# Patient Record
Sex: Male | Born: 1940 | Race: White | Hispanic: No
Health system: Midwestern US, Community
[De-identification: ages and names within clinical notes are randomized; demographics above are authoritative.]

## PROBLEM LIST (undated history)

## (undated) DIAGNOSIS — Z794 Long term (current) use of insulin: Secondary | ICD-10-CM

## (undated) DIAGNOSIS — E1065 Type 1 diabetes mellitus with hyperglycemia: Secondary | ICD-10-CM

## (undated) DIAGNOSIS — N401 Enlarged prostate with lower urinary tract symptoms: Secondary | ICD-10-CM

## (undated) DIAGNOSIS — I10 Essential (primary) hypertension: Secondary | ICD-10-CM

## (undated) DIAGNOSIS — E119 Type 2 diabetes mellitus without complications: Secondary | ICD-10-CM

## (undated) DIAGNOSIS — N138 Other obstructive and reflux uropathy: Secondary | ICD-10-CM

## (undated) DIAGNOSIS — F0280 Dementia in other diseases classified elsewhere without behavioral disturbance: Secondary | ICD-10-CM

## (undated) DIAGNOSIS — Z1159 Encounter for screening for other viral diseases: Secondary | ICD-10-CM

## (undated) DIAGNOSIS — G301 Alzheimer's disease with late onset: Secondary | ICD-10-CM

## (undated) DIAGNOSIS — M6281 Muscle weakness (generalized): Secondary | ICD-10-CM

## (undated) DIAGNOSIS — I69354 Hemiplegia and hemiparesis following cerebral infarction affecting left non-dominant side: Secondary | ICD-10-CM

## (undated) DIAGNOSIS — R197 Diarrhea, unspecified: Secondary | ICD-10-CM

## (undated) DIAGNOSIS — M810 Age-related osteoporosis without current pathological fracture: Secondary | ICD-10-CM

## (undated) DIAGNOSIS — Z515 Encounter for palliative care: Secondary | ICD-10-CM

## (undated) DIAGNOSIS — R350 Frequency of micturition: Secondary | ICD-10-CM

## (undated) DIAGNOSIS — R21 Rash and other nonspecific skin eruption: Secondary | ICD-10-CM

## (undated) DIAGNOSIS — G20 Parkinson's disease: Secondary | ICD-10-CM

## (undated) DIAGNOSIS — G20A1 Parkinson's disease without dyskinesia, without mention of fluctuations: Secondary | ICD-10-CM

## (undated) DIAGNOSIS — J449 Chronic obstructive pulmonary disease, unspecified: Secondary | ICD-10-CM

## (undated) DIAGNOSIS — K219 Gastro-esophageal reflux disease without esophagitis: Secondary | ICD-10-CM

## (undated) DIAGNOSIS — F1910 Other psychoactive substance abuse, uncomplicated: Secondary | ICD-10-CM

## (undated) DIAGNOSIS — G309 Alzheimer's disease, unspecified: Secondary | ICD-10-CM

## (undated) DIAGNOSIS — F209 Schizophrenia, unspecified: Secondary | ICD-10-CM

---

## 2014-04-17 ENCOUNTER — Emergency Department: Payer: Self-pay | Admitting: Emergency Medicine

## 2014-04-17 LAB — CBC WITH DIFFERENTIAL/PLATELET
Basophil #: 0.1 10*3/uL (ref 0.0–0.1)
Basophil %: 0.6 %
EOS PCT: 0.7 %
Eosinophil #: 0.1 10*3/uL (ref 0.0–0.7)
HCT: 40.8 % (ref 40.0–52.0)
HGB: 13.4 g/dL (ref 13.0–18.0)
LYMPHS ABS: 2.5 10*3/uL (ref 1.0–3.6)
Lymphocyte %: 26.4 %
MCH: 30.8 pg (ref 26.0–34.0)
MCHC: 32.8 g/dL (ref 32.0–36.0)
MCV: 94 fL (ref 80–100)
MONO ABS: 0.5 x10 3/mm (ref 0.2–1.0)
MONOS PCT: 4.8 %
Neutrophil #: 6.5 10*3/uL (ref 1.4–6.5)
Neutrophil %: 67.5 %
PLATELETS: 267 10*3/uL (ref 150–440)
RBC: 4.35 10*6/uL — ABNORMAL LOW (ref 4.40–5.90)
RDW: 13.1 % (ref 11.5–14.5)
WBC: 9.6 10*3/uL (ref 3.8–10.6)

## 2014-04-17 LAB — PROTIME-INR
INR: 1.1
PROTHROMBIN TIME: 13.6 s (ref 11.5–14.7)

## 2014-04-17 LAB — BASIC METABOLIC PANEL
ANION GAP: 6 — AB (ref 7–16)
BUN: 14 mg/dL (ref 7–18)
CO2: 30 mmol/L (ref 21–32)
CREATININE: 0.83 mg/dL (ref 0.60–1.30)
Calcium, Total: 9 mg/dL (ref 8.5–10.1)
Chloride: 102 mmol/L (ref 98–107)
EGFR (African American): >60
GLUCOSE: 135 mg/dL — AB (ref 65–99)
Osmolality: 278 (ref 275–301)
Potassium: 3.8 mmol/L (ref 3.5–5.1)
Sodium: 138 mmol/L (ref 136–145)

## 2018-03-04 ENCOUNTER — Encounter: Payer: Self-pay | Admitting: Emergency Medicine

## 2018-03-04 ENCOUNTER — Emergency Department: Payer: Medicare HMO

## 2018-03-04 ENCOUNTER — Emergency Department
Admission: EM | Admit: 2018-03-04 | Discharge: 2018-03-04 | Disposition: A | Discharge status: Home / Self Care | Payer: Medicare HMO | Attending: Emergency Medicine | Admitting: Emergency Medicine

## 2018-03-04 ENCOUNTER — Other Ambulatory Visit: Payer: Self-pay

## 2018-03-04 DIAGNOSIS — Y998 Other external cause status: Secondary | ICD-10-CM | POA: Diagnosis not present

## 2018-03-04 DIAGNOSIS — Y92128 Other place in nursing home as the place of occurrence of the external cause: Secondary | ICD-10-CM | POA: Insufficient documentation

## 2018-03-04 DIAGNOSIS — S0101XA Laceration without foreign body of scalp, initial encounter: Secondary | ICD-10-CM | POA: Insufficient documentation

## 2018-03-04 DIAGNOSIS — S0990XA Unspecified injury of head, initial encounter: Secondary | ICD-10-CM

## 2018-03-04 DIAGNOSIS — W01190A Fall on same level from slipping, tripping and stumbling with subsequent striking against furniture, initial encounter: Secondary | ICD-10-CM | POA: Insufficient documentation

## 2018-03-04 DIAGNOSIS — W19XXXA Unspecified fall, initial encounter: Secondary | ICD-10-CM

## 2018-03-04 DIAGNOSIS — Y9389 Activity, other specified: Secondary | ICD-10-CM | POA: Diagnosis not present

## 2018-03-04 DIAGNOSIS — J449 Chronic obstructive pulmonary disease, unspecified: Secondary | ICD-10-CM | POA: Insufficient documentation

## 2018-03-04 DIAGNOSIS — Z87891 Personal history of nicotine dependence: Secondary | ICD-10-CM | POA: Diagnosis not present

## 2018-03-04 HISTORY — DX: Dementia in other diseases classified elsewhere, unspecified severity, without behavioral disturbance, psychotic disturbance, mood disturbance, and anxiety: F02.80

## 2018-03-04 HISTORY — DX: Parkinson's disease: G20

## 2018-03-04 HISTORY — DX: Other psychoactive substance abuse, uncomplicated: F19.10

## 2018-03-04 HISTORY — DX: Parkinson's disease without dyskinesia, without mention of fluctuations: G20.A1

## 2018-03-04 HISTORY — DX: Schizophrenia, unspecified: F20.9

## 2018-03-04 HISTORY — DX: Gastro-esophageal reflux disease without esophagitis: K21.9

## 2018-03-04 HISTORY — DX: Age-related osteoporosis without current pathological fracture: M81.0

## 2018-03-04 HISTORY — DX: Alzheimer's disease, unspecified: G30.9

## 2018-03-04 HISTORY — DX: Chronic obstructive pulmonary disease, unspecified: J44.9

## 2018-03-04 LAB — GLUCOSE, CAPILLARY: Glucose-Capillary: 89 mg/dL (ref 65–99)

## 2018-03-04 MED ORDER — LIDOCAINE-EPINEPHRINE-TETRACAINE (LET) SOLUTION
3.0000 mL | Freq: Once | NASAL | Status: DC
  Filled 2018-03-04: qty 3

## 2018-03-04 NOTE — ED Notes (Signed)
CT as noted. Patient moved to flex wait.

## 2018-03-04 NOTE — ED Provider Notes (Signed)
[facility] Emergency Department Provider Note  ____________________________________________  Time seen: Approximately 6:05 PM  I have reviewed the triage vital signs and the nursing notes.   HISTORY  Chief Complaint Fall and Head Injury    HPI Aaron Cain is a 77 y.o. male who presents the emergency department status post a fall.  Patient was in [facility] when he was attempting to take box of books out of his closet.  Patient reports that the weight was above his head, he was unable to maintain the same and it caused him to fall striking his head on the corner of a bookcase.  Patient reports that he sustained a laceration to the left posterior scalp.  No loss of consciousness.  Patient reports that he has a headache at this time but denies any visual changes, neck pain, chest pain, shortness of breath, abdominal pain, nausea or vomiting.  Patient reports that this fall was mechanical in nature.  He had no presyncopal or dizzy episodes prior to this event.  No medications prior to arrival.  Bleeding was controlled with direct pressure.  Patient denies any blood thinner use.  Alzheimer's disease, however patient is alert and oriented x4.  Patient is able to answer all questions regarding his own history, present events leading up to injury and since injury.  Patient has a history of Alzheimer's, COPD, GERD, osteoporosis, Parkinson's, schizophrenia.  He is accompanied by caregiver from assisted living facility.  Per caregiver, patient is at his baseline.  Past Medical History:  Diagnosis Date  . Alzheimer disease   . COPD (chronic obstructive pulmonary disease) (HCC)   . Drug abuse (HCC)   . GERD (gastroesophageal reflux disease)   . Osteoporosis   . Parkinson disease (HCC)   . Schizophrenia (HCC)     There are no active problems to display for this patient.   History reviewed. No pertinent surgical history.  Prior  to Admission medications   Medication Sig Start Date End Date Taking? Authorizing Provider  alendronate (FOSAMAX) 70 MG tablet Take 70 mg by mouth once a week. Take with a full glass of water on an empty stomach.   Yes [provider]  divalproex (DEPAKOTE) 500 MG DR tablet Take 500 mg by mouth 3 (three) times daily.   Yes [provider]  mirtazapine (REMERON) 30 MG tablet Take 30 mg by mouth at bedtime.   Yes [provider]  omeprazole (PRILOSEC) 20 MG capsule Take 20 mg by mouth daily.   Yes [provider]    Allergies Penicillins and Sulfa antibiotics  History reviewed. No pertinent family history.  Social History Social History   Tobacco Use  . Smoking status: Former Games developer  . Smokeless tobacco: Never Used  Substance Use Topics  . Alcohol use: Not Currently  . Drug use: Not Currently     Review of Systems  Constitutional: No fever/chills Eyes: No visual changes. No discharge ENT: No upper respiratory complaints. Cardiovascular: no chest pain. Respiratory: no cough. No SOB. Gastrointestinal: No abdominal pain.  No nausea, no vomiting.  No diarrhea.  No constipation. Genitourinary: Negative for dysuria. No hematuria Musculoskeletal: Negative for musculoskeletal pain. Skin: Positive for laceration to posterior scalp Neurological: Positive for headache focal weakness or numbness. 10-point ROS otherwise negative.  ____________________________________________   PHYSICAL EXAM:  VITAL SIGNS: ED Triage Vitals  Enc Vitals Group     BP 03/04/18 1554 140/83     Pulse Rate 03/04/18  1554 (!) 107     Resp 03/04/18 1554 20     Temp 03/04/18 1554 98.2 F (36.8 C)     Temp Source 03/04/18 1554 Oral     SpO2 03/04/18 1554 91 %     Weight --      Height --      Head Circumference --      Peak Flow --      Pain Score 03/04/18 1555 2     Pain Loc --      Pain Edu? --      Excl. in GC? --      Constitutional: Alert and oriented. Well  appearing and in no acute distress. Eyes: Conjunctivae are normal. PERRL. EOMI. Head: 3 cm linear laceration with an additional 4 cm of linear abrasion extending from laceration or appreciated to the left occipital and parietal scalp region.  No bleeding at this time.  No foreign body.  Patient is tender to palpation over this region.  No other tenderness to palpation over the osseous structures of the skull and face.  No battle signs, raccoon eyes, serosanguineous fluid drainage from ears or nares. ENT:      Ears:       Nose: No congestion/rhinnorhea.      Mouth/Throat: Mucous membranes are moist.  Neck: No stridor.  No cervical spine tenderness to palpation.  Cardiovascular: Normal rate, regular rhythm. Normal S1 and S2.  Good peripheral circulation. Respiratory: Normal respiratory effort without tachypnea or retractions. Lungs CTAB. Good air entry to the bases with no decreased or absent breath sounds. Musculoskeletal: Full range of motion to all extremities. No gross deformities appreciated. Neurologic:  Normal speech and language. No gross focal neurologic deficits are appreciated.  Cranial nerves II through XII grossly intact. Skin:  Skin is warm, dry and intact. No rash noted. Psychiatric: Mood and affect are normal. Speech and behavior are normal. Patient exhibits appropriate insight and judgement.   ____________________________________________   LABS (all labs ordered are listed, but only abnormal results are displayed)  Labs Reviewed  GLUCOSE, CAPILLARY  CBG MONITORING, ED   ____________________________________________  EKG   ____________________________________________  RADIOLOGY Festus Barren Cuthriell, personally viewed and evaluated these images (plain radiographs) as part of my medical decision making, as well as reviewing the written report by the radiologist.  I concur with radiologist finding of no acute intracranial hemorrhage or skull fracture.  Ct Head Wo  Contrast  Result Date: 03/04/2018 CLINICAL DATA:  Pt states he was grabbing a box of personal items from his closet and the box fell on his head. Pt denies LOC. EXAM: CT HEAD WITHOUT CONTRAST TECHNIQUE: Contiguous axial images were obtained from the base of the skull through the vertex without intravenous contrast. COMPARISON:  04/17/2014 FINDINGS: Brain: No evidence of acute infarction, hemorrhage, hydrocephalus, extra-axial collection or mass lesion/mass effect. There is ventricular and sulcal enlargement reflecting moderate atrophy. Patchy hypoattenuation is noted in the left parietal lobe which may reflect an old area of infarction or asymmetric small-vessel ischemic change. This is stable. Vascular: No hyperdense vessel or unexpected calcification. Skull: Normal. Negative for fracture or focal lesion. Sinuses/Orbits: Globes and orbits are unremarkable. Right ethmoid sinus mucosal thickening. Remaining sinuses and mastoid air cells are clear. Other: None. IMPRESSION: 1. No acute intracranial abnormalities. 2. Atrophy and chronic microvascular ischemic change. 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 No change from the prior head CT. Electronically Signed   By: Amie Portland M.D.   On:  03/04/2018 17:11    ____________________________________________    PROCEDURES  Procedure(s) performed:    Marland KitchenMarland KitchenLaceration Repair Date/Time: 03/04/2018 7:10 PM Performed by: Racheal Patches, PA-C Authorized by: Racheal Patches, PA-C   Consent:    Consent obtained:  Verbal   Consent given by:  Patient and healthcare agent   Risks discussed:  Pain Anesthesia (see MAR for exact dosages):    Anesthesia method:  Topical application   Topical anesthetic:  LET Laceration details:    Location:  Scalp   Scalp location:  L parietal   Length (cm):  3 Repair type:    Repair type:  Simple Pre-procedure details:    Preparation:  Patient was prepped and draped in usual sterile fashion Exploration:     Hemostasis achieved with:  LET and direct pressure   Wound exploration: wound explored through full range of motion and entire depth of wound probed and visualized     Wound extent: no foreign bodies/material noted, no muscle damage noted, no nerve damage noted, no tendon damage noted, no underlying fracture noted and no vascular damage noted     Contaminated: no   Treatment:    Area cleansed with:  Shur-Clens   Amount of cleaning:  Standard Skin repair:    Repair method:  Staples   Number of staples:  4 Approximation:    Approximation:  Close Post-procedure details:    Dressing:  Open (no dressing)   Patient tolerance of procedure:  Tolerated well, no immediate complications      Medications  lidocaine-EPINEPHrine-tetracaine (LET) solution (has no administration in time range)     ____________________________________________   INITIAL IMPRESSION / ASSESSMENT AND PLAN / ED COURSE  Pertinent labs & imaging results that were available during my care of the patient were reviewed by me and considered in my medical decision making (see chart for details).  Review of the [place] CSRS was performed in accordance of the [facility] prior to dispensing any controlled drugs.     Patient's diagnosis is consistent with fall with head laceration.  Patient had a mechanical fall, struck his head on the corner of a dresser.  Patient had a laceration bleeding controlled prior to arrival.  Exam is reassuring.  Patient was neurologically intact.  At baseline per his healthcare caregiver.  Patient does have Alzheimer's but was able to recount the entire event, provide his own past medical history.  CT scan reveals no intracranial or osseous abnormality.  Patient's head laceration was closed using staples..  She will follow-up with primary care in 1 week for staple removal.  Patient is given ED precautions to return to the ED for any worsening or new  symptoms.     ____________________________________________  FINAL CLINICAL IMPRESSION(S) / ED DIAGNOSES  Final diagnoses:  Fall, initial encounter  Laceration of scalp, initial encounter  Minor head injury, initial encounter      NEW MEDICATIONS STARTED DURING THIS VISIT:  ED Discharge Orders    None          This chart was dictated using voice recognition software/Dragon. Despite best efforts to proofread, errors can occur which can change the meaning. Any change was purely unintentional.    Racheal Patches, PA-C 03/04/18 1911    Arnaldo Natal, MD 03/09/18 1314

## 2018-03-04 NOTE — ED Triage Notes (Signed)
Pt arrived via POV from [facility]. Pt states he was getting stuff out of his closet using his right arm. Pt states his right arm is weak from a fall 2-3 days ago which was mechanical. Pt states when he fell today he hit his head on the corner of the table. Pt has laceration to the back of the head. Bleeding is controlled at this time.  Pt is alert and oriented, states he is not on any blood thinners.  Pt here with caregiver from [facility].   Pt denies any dizziness or lightheadedness.  Pt states pain is 2/10.  Denies any LOC.

## 2018-03-04 NOTE — ED Notes (Addendum)
See triage note. States he fell while getting something out of closet  Hit head  No loc  Laceration noted to left side of scalp

## 2018-03-04 NOTE — ED Notes (Signed)
D/w Dr. Fanny Bien pt's CC, new orders received for CBG and CT head non=contrast.  Pt denies any other pain at this time.

## 2018-07-09 DIAGNOSIS — F209 Schizophrenia, unspecified: Secondary | ICD-10-CM | POA: Diagnosis not present

## 2018-07-10 DIAGNOSIS — F209 Schizophrenia, unspecified: Secondary | ICD-10-CM | POA: Diagnosis not present

## 2018-07-11 DIAGNOSIS — F209 Schizophrenia, unspecified: Secondary | ICD-10-CM | POA: Diagnosis not present

## 2018-07-12 DIAGNOSIS — F209 Schizophrenia, unspecified: Secondary | ICD-10-CM | POA: Diagnosis not present

## 2018-07-13 DIAGNOSIS — F209 Schizophrenia, unspecified: Secondary | ICD-10-CM | POA: Diagnosis not present

## 2018-07-14 DIAGNOSIS — F209 Schizophrenia, unspecified: Secondary | ICD-10-CM | POA: Diagnosis not present

## 2018-07-15 DIAGNOSIS — F209 Schizophrenia, unspecified: Secondary | ICD-10-CM | POA: Diagnosis not present

## 2018-07-16 DIAGNOSIS — F209 Schizophrenia, unspecified: Secondary | ICD-10-CM | POA: Diagnosis not present

## 2018-07-17 DIAGNOSIS — F209 Schizophrenia, unspecified: Secondary | ICD-10-CM | POA: Diagnosis not present

## 2018-07-18 DIAGNOSIS — F209 Schizophrenia, unspecified: Secondary | ICD-10-CM | POA: Diagnosis not present

## 2018-07-19 DIAGNOSIS — F209 Schizophrenia, unspecified: Secondary | ICD-10-CM | POA: Diagnosis not present

## 2018-07-20 DIAGNOSIS — F209 Schizophrenia, unspecified: Secondary | ICD-10-CM | POA: Diagnosis not present

## 2018-07-21 DIAGNOSIS — F209 Schizophrenia, unspecified: Secondary | ICD-10-CM | POA: Diagnosis not present

## 2018-07-28 DIAGNOSIS — R32 Unspecified urinary incontinence: Secondary | ICD-10-CM | POA: Diagnosis not present

## 2018-07-28 DIAGNOSIS — G309 Alzheimer's disease, unspecified: Secondary | ICD-10-CM | POA: Diagnosis not present

## 2018-07-29 DIAGNOSIS — F209 Schizophrenia, unspecified: Secondary | ICD-10-CM | POA: Diagnosis not present

## 2018-07-30 DIAGNOSIS — F209 Schizophrenia, unspecified: Secondary | ICD-10-CM | POA: Diagnosis not present

## 2018-07-31 DIAGNOSIS — F0280 Dementia in other diseases classified elsewhere without behavioral disturbance: Secondary | ICD-10-CM | POA: Diagnosis not present

## 2018-07-31 DIAGNOSIS — G309 Alzheimer's disease, unspecified: Secondary | ICD-10-CM | POA: Diagnosis not present

## 2018-07-31 DIAGNOSIS — F2089 Other schizophrenia: Secondary | ICD-10-CM | POA: Diagnosis not present

## 2018-07-31 DIAGNOSIS — F209 Schizophrenia, unspecified: Secondary | ICD-10-CM | POA: Diagnosis not present

## 2018-08-01 DIAGNOSIS — F209 Schizophrenia, unspecified: Secondary | ICD-10-CM | POA: Diagnosis not present

## 2018-08-02 DIAGNOSIS — F209 Schizophrenia, unspecified: Secondary | ICD-10-CM | POA: Diagnosis not present

## 2018-08-03 DIAGNOSIS — F209 Schizophrenia, unspecified: Secondary | ICD-10-CM | POA: Diagnosis not present

## 2018-08-04 DIAGNOSIS — F209 Schizophrenia, unspecified: Secondary | ICD-10-CM | POA: Diagnosis not present

## 2018-08-05 DIAGNOSIS — F209 Schizophrenia, unspecified: Secondary | ICD-10-CM | POA: Diagnosis not present

## 2018-08-06 DIAGNOSIS — F209 Schizophrenia, unspecified: Secondary | ICD-10-CM | POA: Diagnosis not present

## 2018-08-07 DIAGNOSIS — F209 Schizophrenia, unspecified: Secondary | ICD-10-CM | POA: Diagnosis not present

## 2018-08-08 DIAGNOSIS — F209 Schizophrenia, unspecified: Secondary | ICD-10-CM | POA: Diagnosis not present

## 2018-08-09 DIAGNOSIS — F209 Schizophrenia, unspecified: Secondary | ICD-10-CM | POA: Diagnosis not present

## 2018-08-10 DIAGNOSIS — F209 Schizophrenia, unspecified: Secondary | ICD-10-CM | POA: Diagnosis not present

## 2018-08-11 DIAGNOSIS — F209 Schizophrenia, unspecified: Secondary | ICD-10-CM | POA: Diagnosis not present

## 2018-08-12 DIAGNOSIS — F209 Schizophrenia, unspecified: Secondary | ICD-10-CM | POA: Diagnosis not present

## 2018-08-13 DIAGNOSIS — F209 Schizophrenia, unspecified: Secondary | ICD-10-CM | POA: Diagnosis not present

## 2018-08-14 DIAGNOSIS — F209 Schizophrenia, unspecified: Secondary | ICD-10-CM | POA: Diagnosis not present

## 2018-08-15 DIAGNOSIS — F209 Schizophrenia, unspecified: Secondary | ICD-10-CM | POA: Diagnosis not present

## 2018-08-16 DIAGNOSIS — F209 Schizophrenia, unspecified: Secondary | ICD-10-CM | POA: Diagnosis not present

## 2018-08-17 DIAGNOSIS — F5109 Other insomnia not due to a substance or known physiological condition: Secondary | ICD-10-CM | POA: Diagnosis not present

## 2018-08-17 DIAGNOSIS — F209 Schizophrenia, unspecified: Secondary | ICD-10-CM | POA: Diagnosis not present

## 2018-08-17 DIAGNOSIS — M1990 Unspecified osteoarthritis, unspecified site: Secondary | ICD-10-CM | POA: Diagnosis not present

## 2018-08-17 DIAGNOSIS — R3981 Functional urinary incontinence: Secondary | ICD-10-CM | POA: Diagnosis not present

## 2018-08-17 DIAGNOSIS — G308 Other Alzheimer's disease: Secondary | ICD-10-CM | POA: Diagnosis not present

## 2018-08-18 DIAGNOSIS — F209 Schizophrenia, unspecified: Secondary | ICD-10-CM | POA: Diagnosis not present

## 2018-08-19 DIAGNOSIS — F209 Schizophrenia, unspecified: Secondary | ICD-10-CM | POA: Diagnosis not present

## 2018-08-20 DIAGNOSIS — F209 Schizophrenia, unspecified: Secondary | ICD-10-CM | POA: Diagnosis not present

## 2018-08-21 DIAGNOSIS — F209 Schizophrenia, unspecified: Secondary | ICD-10-CM | POA: Diagnosis not present

## 2018-08-22 DIAGNOSIS — F209 Schizophrenia, unspecified: Secondary | ICD-10-CM | POA: Diagnosis not present

## 2018-08-23 DIAGNOSIS — F209 Schizophrenia, unspecified: Secondary | ICD-10-CM | POA: Diagnosis not present

## 2018-08-24 DIAGNOSIS — F209 Schizophrenia, unspecified: Secondary | ICD-10-CM | POA: Diagnosis not present

## 2018-08-24 DIAGNOSIS — M810 Age-related osteoporosis without current pathological fracture: Secondary | ICD-10-CM | POA: Diagnosis not present

## 2018-08-24 DIAGNOSIS — Z79899 Other long term (current) drug therapy: Secondary | ICD-10-CM | POA: Diagnosis not present

## 2018-08-24 DIAGNOSIS — J449 Chronic obstructive pulmonary disease, unspecified: Secondary | ICD-10-CM | POA: Diagnosis not present

## 2018-08-25 DIAGNOSIS — F209 Schizophrenia, unspecified: Secondary | ICD-10-CM | POA: Diagnosis not present

## 2018-08-26 DIAGNOSIS — F209 Schizophrenia, unspecified: Secondary | ICD-10-CM | POA: Diagnosis not present

## 2018-08-27 DIAGNOSIS — F209 Schizophrenia, unspecified: Secondary | ICD-10-CM | POA: Diagnosis not present

## 2018-08-28 DIAGNOSIS — F209 Schizophrenia, unspecified: Secondary | ICD-10-CM | POA: Diagnosis not present

## 2018-08-29 DIAGNOSIS — F209 Schizophrenia, unspecified: Secondary | ICD-10-CM | POA: Diagnosis not present

## 2018-08-29 DIAGNOSIS — Z23 Encounter for immunization: Secondary | ICD-10-CM | POA: Diagnosis not present

## 2018-08-30 DIAGNOSIS — G309 Alzheimer's disease, unspecified: Secondary | ICD-10-CM | POA: Diagnosis not present

## 2018-08-30 DIAGNOSIS — F209 Schizophrenia, unspecified: Secondary | ICD-10-CM | POA: Diagnosis not present

## 2018-08-30 DIAGNOSIS — R32 Unspecified urinary incontinence: Secondary | ICD-10-CM | POA: Diagnosis not present

## 2018-08-31 DIAGNOSIS — F209 Schizophrenia, unspecified: Secondary | ICD-10-CM | POA: Diagnosis not present

## 2018-09-01 DIAGNOSIS — F209 Schizophrenia, unspecified: Secondary | ICD-10-CM | POA: Diagnosis not present

## 2018-09-02 DIAGNOSIS — F209 Schizophrenia, unspecified: Secondary | ICD-10-CM | POA: Diagnosis not present

## 2018-09-03 DIAGNOSIS — F209 Schizophrenia, unspecified: Secondary | ICD-10-CM | POA: Diagnosis not present

## 2018-09-04 DIAGNOSIS — F209 Schizophrenia, unspecified: Secondary | ICD-10-CM | POA: Diagnosis not present

## 2018-09-04 DIAGNOSIS — F0280 Dementia in other diseases classified elsewhere without behavioral disturbance: Secondary | ICD-10-CM | POA: Diagnosis not present

## 2018-09-04 DIAGNOSIS — G309 Alzheimer's disease, unspecified: Secondary | ICD-10-CM | POA: Diagnosis not present

## 2018-09-04 DIAGNOSIS — F2089 Other schizophrenia: Secondary | ICD-10-CM | POA: Diagnosis not present

## 2018-09-05 DIAGNOSIS — F209 Schizophrenia, unspecified: Secondary | ICD-10-CM | POA: Diagnosis not present

## 2018-09-06 DIAGNOSIS — F209 Schizophrenia, unspecified: Secondary | ICD-10-CM | POA: Diagnosis not present

## 2018-09-07 DIAGNOSIS — F209 Schizophrenia, unspecified: Secondary | ICD-10-CM | POA: Diagnosis not present

## 2018-09-08 DIAGNOSIS — F209 Schizophrenia, unspecified: Secondary | ICD-10-CM | POA: Diagnosis not present

## 2018-09-09 DIAGNOSIS — F209 Schizophrenia, unspecified: Secondary | ICD-10-CM | POA: Diagnosis not present

## 2018-09-10 DIAGNOSIS — F209 Schizophrenia, unspecified: Secondary | ICD-10-CM | POA: Diagnosis not present

## 2018-09-11 DIAGNOSIS — F209 Schizophrenia, unspecified: Secondary | ICD-10-CM | POA: Diagnosis not present

## 2018-09-12 DIAGNOSIS — F209 Schizophrenia, unspecified: Secondary | ICD-10-CM | POA: Diagnosis not present

## 2018-09-13 DIAGNOSIS — F209 Schizophrenia, unspecified: Secondary | ICD-10-CM | POA: Diagnosis not present

## 2018-09-14 DIAGNOSIS — F209 Schizophrenia, unspecified: Secondary | ICD-10-CM | POA: Diagnosis not present

## 2018-09-15 DIAGNOSIS — F209 Schizophrenia, unspecified: Secondary | ICD-10-CM | POA: Diagnosis not present

## 2018-09-16 DIAGNOSIS — F209 Schizophrenia, unspecified: Secondary | ICD-10-CM | POA: Diagnosis not present

## 2018-09-17 DIAGNOSIS — F209 Schizophrenia, unspecified: Secondary | ICD-10-CM | POA: Diagnosis not present

## 2018-09-18 DIAGNOSIS — F209 Schizophrenia, unspecified: Secondary | ICD-10-CM | POA: Diagnosis not present

## 2018-09-19 DIAGNOSIS — F209 Schizophrenia, unspecified: Secondary | ICD-10-CM | POA: Diagnosis not present

## 2018-09-20 DIAGNOSIS — F209 Schizophrenia, unspecified: Secondary | ICD-10-CM | POA: Diagnosis not present

## 2018-09-21 DIAGNOSIS — F209 Schizophrenia, unspecified: Secondary | ICD-10-CM | POA: Diagnosis not present

## 2018-09-22 DIAGNOSIS — F209 Schizophrenia, unspecified: Secondary | ICD-10-CM | POA: Diagnosis not present

## 2018-09-23 DIAGNOSIS — F209 Schizophrenia, unspecified: Secondary | ICD-10-CM | POA: Diagnosis not present

## 2018-09-24 DIAGNOSIS — F209 Schizophrenia, unspecified: Secondary | ICD-10-CM | POA: Diagnosis not present

## 2018-09-25 DIAGNOSIS — F209 Schizophrenia, unspecified: Secondary | ICD-10-CM | POA: Diagnosis not present

## 2018-09-26 DIAGNOSIS — F209 Schizophrenia, unspecified: Secondary | ICD-10-CM | POA: Diagnosis not present

## 2018-09-27 DIAGNOSIS — F209 Schizophrenia, unspecified: Secondary | ICD-10-CM | POA: Diagnosis not present

## 2018-09-28 DIAGNOSIS — F209 Schizophrenia, unspecified: Secondary | ICD-10-CM | POA: Diagnosis not present

## 2018-09-29 DIAGNOSIS — R32 Unspecified urinary incontinence: Secondary | ICD-10-CM | POA: Diagnosis not present

## 2018-09-29 DIAGNOSIS — G309 Alzheimer's disease, unspecified: Secondary | ICD-10-CM | POA: Diagnosis not present

## 2018-09-29 DIAGNOSIS — F209 Schizophrenia, unspecified: Secondary | ICD-10-CM | POA: Diagnosis not present

## 2018-09-30 DIAGNOSIS — F209 Schizophrenia, unspecified: Secondary | ICD-10-CM | POA: Diagnosis not present

## 2018-10-01 DIAGNOSIS — F209 Schizophrenia, unspecified: Secondary | ICD-10-CM | POA: Diagnosis not present

## 2018-10-02 DIAGNOSIS — G309 Alzheimer's disease, unspecified: Secondary | ICD-10-CM | POA: Diagnosis not present

## 2018-10-02 DIAGNOSIS — F2089 Other schizophrenia: Secondary | ICD-10-CM | POA: Diagnosis not present

## 2018-10-02 DIAGNOSIS — F209 Schizophrenia, unspecified: Secondary | ICD-10-CM | POA: Diagnosis not present

## 2018-10-02 DIAGNOSIS — F0280 Dementia in other diseases classified elsewhere without behavioral disturbance: Secondary | ICD-10-CM | POA: Diagnosis not present

## 2018-10-03 DIAGNOSIS — F209 Schizophrenia, unspecified: Secondary | ICD-10-CM | POA: Diagnosis not present

## 2018-10-04 DIAGNOSIS — F209 Schizophrenia, unspecified: Secondary | ICD-10-CM | POA: Diagnosis not present

## 2018-10-05 DIAGNOSIS — F209 Schizophrenia, unspecified: Secondary | ICD-10-CM | POA: Diagnosis not present

## 2018-10-06 DIAGNOSIS — F209 Schizophrenia, unspecified: Secondary | ICD-10-CM | POA: Diagnosis not present

## 2018-10-07 DIAGNOSIS — F209 Schizophrenia, unspecified: Secondary | ICD-10-CM | POA: Diagnosis not present

## 2018-10-08 DIAGNOSIS — F209 Schizophrenia, unspecified: Secondary | ICD-10-CM | POA: Diagnosis not present

## 2018-10-09 DIAGNOSIS — F209 Schizophrenia, unspecified: Secondary | ICD-10-CM | POA: Diagnosis not present

## 2018-10-10 DIAGNOSIS — F209 Schizophrenia, unspecified: Secondary | ICD-10-CM | POA: Diagnosis not present

## 2018-10-11 DIAGNOSIS — F209 Schizophrenia, unspecified: Secondary | ICD-10-CM | POA: Diagnosis not present

## 2018-10-12 DIAGNOSIS — K5909 Other constipation: Secondary | ICD-10-CM | POA: Diagnosis not present

## 2018-10-12 DIAGNOSIS — G308 Other Alzheimer's disease: Secondary | ICD-10-CM | POA: Diagnosis not present

## 2018-10-12 DIAGNOSIS — H04129 Dry eye syndrome of unspecified lacrimal gland: Secondary | ICD-10-CM | POA: Diagnosis not present

## 2018-10-12 DIAGNOSIS — F209 Schizophrenia, unspecified: Secondary | ICD-10-CM | POA: Diagnosis not present

## 2018-10-13 DIAGNOSIS — F209 Schizophrenia, unspecified: Secondary | ICD-10-CM | POA: Diagnosis not present

## 2018-10-21 DIAGNOSIS — F209 Schizophrenia, unspecified: Secondary | ICD-10-CM | POA: Diagnosis not present

## 2018-10-22 DIAGNOSIS — F209 Schizophrenia, unspecified: Secondary | ICD-10-CM | POA: Diagnosis not present

## 2018-10-23 DIAGNOSIS — F209 Schizophrenia, unspecified: Secondary | ICD-10-CM | POA: Diagnosis not present

## 2018-10-24 DIAGNOSIS — F209 Schizophrenia, unspecified: Secondary | ICD-10-CM | POA: Diagnosis not present

## 2018-10-25 DIAGNOSIS — F209 Schizophrenia, unspecified: Secondary | ICD-10-CM | POA: Diagnosis not present

## 2018-10-26 DIAGNOSIS — F209 Schizophrenia, unspecified: Secondary | ICD-10-CM | POA: Diagnosis not present

## 2018-10-27 DIAGNOSIS — G309 Alzheimer's disease, unspecified: Secondary | ICD-10-CM | POA: Diagnosis not present

## 2018-10-27 DIAGNOSIS — F209 Schizophrenia, unspecified: Secondary | ICD-10-CM | POA: Diagnosis not present

## 2018-10-27 DIAGNOSIS — R32 Unspecified urinary incontinence: Secondary | ICD-10-CM | POA: Diagnosis not present

## 2018-10-28 DIAGNOSIS — F209 Schizophrenia, unspecified: Secondary | ICD-10-CM | POA: Diagnosis not present

## 2018-10-29 DIAGNOSIS — F209 Schizophrenia, unspecified: Secondary | ICD-10-CM | POA: Diagnosis not present

## 2018-10-30 DIAGNOSIS — F0280 Dementia in other diseases classified elsewhere without behavioral disturbance: Secondary | ICD-10-CM | POA: Diagnosis not present

## 2018-10-30 DIAGNOSIS — F209 Schizophrenia, unspecified: Secondary | ICD-10-CM | POA: Diagnosis not present

## 2018-10-30 DIAGNOSIS — F2089 Other schizophrenia: Secondary | ICD-10-CM | POA: Diagnosis not present

## 2018-10-30 DIAGNOSIS — G309 Alzheimer's disease, unspecified: Secondary | ICD-10-CM | POA: Diagnosis not present

## 2018-10-31 DIAGNOSIS — F209 Schizophrenia, unspecified: Secondary | ICD-10-CM | POA: Diagnosis not present

## 2018-11-01 DIAGNOSIS — F209 Schizophrenia, unspecified: Secondary | ICD-10-CM | POA: Diagnosis not present

## 2018-11-02 DIAGNOSIS — F209 Schizophrenia, unspecified: Secondary | ICD-10-CM | POA: Diagnosis not present

## 2018-11-03 DIAGNOSIS — F209 Schizophrenia, unspecified: Secondary | ICD-10-CM | POA: Diagnosis not present

## 2018-11-04 DIAGNOSIS — F209 Schizophrenia, unspecified: Secondary | ICD-10-CM | POA: Diagnosis not present

## 2018-11-05 DIAGNOSIS — F209 Schizophrenia, unspecified: Secondary | ICD-10-CM | POA: Diagnosis not present

## 2018-11-06 DIAGNOSIS — F209 Schizophrenia, unspecified: Secondary | ICD-10-CM | POA: Diagnosis not present

## 2018-11-07 DIAGNOSIS — F209 Schizophrenia, unspecified: Secondary | ICD-10-CM | POA: Diagnosis not present

## 2018-11-08 DIAGNOSIS — F209 Schizophrenia, unspecified: Secondary | ICD-10-CM | POA: Diagnosis not present

## 2018-11-09 DIAGNOSIS — F209 Schizophrenia, unspecified: Secondary | ICD-10-CM | POA: Diagnosis not present

## 2018-11-10 DIAGNOSIS — F209 Schizophrenia, unspecified: Secondary | ICD-10-CM | POA: Diagnosis not present

## 2018-11-11 DIAGNOSIS — F209 Schizophrenia, unspecified: Secondary | ICD-10-CM | POA: Diagnosis not present

## 2018-11-12 DIAGNOSIS — F209 Schizophrenia, unspecified: Secondary | ICD-10-CM | POA: Diagnosis not present

## 2018-11-13 DIAGNOSIS — F209 Schizophrenia, unspecified: Secondary | ICD-10-CM | POA: Diagnosis not present

## 2018-11-14 DIAGNOSIS — F209 Schizophrenia, unspecified: Secondary | ICD-10-CM | POA: Diagnosis not present

## 2018-11-15 DIAGNOSIS — F209 Schizophrenia, unspecified: Secondary | ICD-10-CM | POA: Diagnosis not present

## 2018-11-16 DIAGNOSIS — F209 Schizophrenia, unspecified: Secondary | ICD-10-CM | POA: Diagnosis not present

## 2018-11-17 DIAGNOSIS — F209 Schizophrenia, unspecified: Secondary | ICD-10-CM | POA: Diagnosis not present

## 2018-11-18 DIAGNOSIS — F209 Schizophrenia, unspecified: Secondary | ICD-10-CM | POA: Diagnosis not present

## 2018-11-19 DIAGNOSIS — F209 Schizophrenia, unspecified: Secondary | ICD-10-CM | POA: Diagnosis not present

## 2018-11-20 DIAGNOSIS — F209 Schizophrenia, unspecified: Secondary | ICD-10-CM | POA: Diagnosis not present

## 2018-11-21 DIAGNOSIS — F209 Schizophrenia, unspecified: Secondary | ICD-10-CM | POA: Diagnosis not present

## 2018-11-22 DIAGNOSIS — F209 Schizophrenia, unspecified: Secondary | ICD-10-CM | POA: Diagnosis not present

## 2018-11-23 DIAGNOSIS — F209 Schizophrenia, unspecified: Secondary | ICD-10-CM | POA: Diagnosis not present

## 2018-11-24 DIAGNOSIS — F209 Schizophrenia, unspecified: Secondary | ICD-10-CM | POA: Diagnosis not present

## 2018-11-25 DIAGNOSIS — F209 Schizophrenia, unspecified: Secondary | ICD-10-CM | POA: Diagnosis not present

## 2018-11-26 DIAGNOSIS — F209 Schizophrenia, unspecified: Secondary | ICD-10-CM | POA: Diagnosis not present

## 2018-11-27 DIAGNOSIS — F209 Schizophrenia, unspecified: Secondary | ICD-10-CM | POA: Diagnosis not present

## 2018-11-28 DIAGNOSIS — F209 Schizophrenia, unspecified: Secondary | ICD-10-CM | POA: Diagnosis not present

## 2018-11-29 DIAGNOSIS — F209 Schizophrenia, unspecified: Secondary | ICD-10-CM | POA: Diagnosis not present

## 2018-11-30 DIAGNOSIS — G309 Alzheimer's disease, unspecified: Secondary | ICD-10-CM | POA: Diagnosis not present

## 2018-11-30 DIAGNOSIS — R32 Unspecified urinary incontinence: Secondary | ICD-10-CM | POA: Diagnosis not present

## 2018-11-30 DIAGNOSIS — M810 Age-related osteoporosis without current pathological fracture: Secondary | ICD-10-CM | POA: Diagnosis not present

## 2018-11-30 DIAGNOSIS — K5909 Other constipation: Secondary | ICD-10-CM | POA: Diagnosis not present

## 2018-11-30 DIAGNOSIS — K219 Gastro-esophageal reflux disease without esophagitis: Secondary | ICD-10-CM | POA: Diagnosis not present

## 2018-11-30 DIAGNOSIS — F209 Schizophrenia, unspecified: Secondary | ICD-10-CM | POA: Diagnosis not present

## 2018-11-30 DIAGNOSIS — F3289 Other specified depressive episodes: Secondary | ICD-10-CM | POA: Diagnosis not present

## 2018-12-01 DIAGNOSIS — H25813 Combined forms of age-related cataract, bilateral: Secondary | ICD-10-CM | POA: Diagnosis not present

## 2018-12-01 DIAGNOSIS — F209 Schizophrenia, unspecified: Secondary | ICD-10-CM | POA: Diagnosis not present

## 2018-12-04 DIAGNOSIS — G309 Alzheimer's disease, unspecified: Secondary | ICD-10-CM | POA: Diagnosis not present

## 2018-12-04 DIAGNOSIS — F0280 Dementia in other diseases classified elsewhere without behavioral disturbance: Secondary | ICD-10-CM | POA: Diagnosis not present

## 2018-12-04 DIAGNOSIS — F2089 Other schizophrenia: Secondary | ICD-10-CM | POA: Diagnosis not present

## 2018-12-09 DIAGNOSIS — F209 Schizophrenia, unspecified: Secondary | ICD-10-CM | POA: Diagnosis not present

## 2018-12-10 DIAGNOSIS — F209 Schizophrenia, unspecified: Secondary | ICD-10-CM | POA: Diagnosis not present

## 2018-12-11 DIAGNOSIS — F209 Schizophrenia, unspecified: Secondary | ICD-10-CM | POA: Diagnosis not present

## 2018-12-12 DIAGNOSIS — F209 Schizophrenia, unspecified: Secondary | ICD-10-CM | POA: Diagnosis not present

## 2018-12-13 DIAGNOSIS — F209 Schizophrenia, unspecified: Secondary | ICD-10-CM | POA: Diagnosis not present

## 2018-12-14 DIAGNOSIS — F209 Schizophrenia, unspecified: Secondary | ICD-10-CM | POA: Diagnosis not present

## 2018-12-15 DIAGNOSIS — F209 Schizophrenia, unspecified: Secondary | ICD-10-CM | POA: Diagnosis not present

## 2018-12-16 DIAGNOSIS — F209 Schizophrenia, unspecified: Secondary | ICD-10-CM | POA: Diagnosis not present

## 2018-12-17 DIAGNOSIS — F209 Schizophrenia, unspecified: Secondary | ICD-10-CM | POA: Diagnosis not present

## 2018-12-18 DIAGNOSIS — F209 Schizophrenia, unspecified: Secondary | ICD-10-CM | POA: Diagnosis not present

## 2018-12-19 DIAGNOSIS — F209 Schizophrenia, unspecified: Secondary | ICD-10-CM | POA: Diagnosis not present

## 2018-12-20 DIAGNOSIS — F209 Schizophrenia, unspecified: Secondary | ICD-10-CM | POA: Diagnosis not present

## 2018-12-21 DIAGNOSIS — F209 Schizophrenia, unspecified: Secondary | ICD-10-CM | POA: Diagnosis not present

## 2018-12-22 DIAGNOSIS — F209 Schizophrenia, unspecified: Secondary | ICD-10-CM | POA: Diagnosis not present

## 2018-12-23 DIAGNOSIS — F209 Schizophrenia, unspecified: Secondary | ICD-10-CM | POA: Diagnosis not present

## 2018-12-24 DIAGNOSIS — F209 Schizophrenia, unspecified: Secondary | ICD-10-CM | POA: Diagnosis not present

## 2018-12-25 DIAGNOSIS — F209 Schizophrenia, unspecified: Secondary | ICD-10-CM | POA: Diagnosis not present

## 2018-12-26 DIAGNOSIS — F209 Schizophrenia, unspecified: Secondary | ICD-10-CM | POA: Diagnosis not present

## 2018-12-27 DIAGNOSIS — F209 Schizophrenia, unspecified: Secondary | ICD-10-CM | POA: Diagnosis not present

## 2018-12-28 DIAGNOSIS — F209 Schizophrenia, unspecified: Secondary | ICD-10-CM | POA: Diagnosis not present

## 2018-12-29 DIAGNOSIS — F209 Schizophrenia, unspecified: Secondary | ICD-10-CM | POA: Diagnosis not present

## 2018-12-30 DIAGNOSIS — F209 Schizophrenia, unspecified: Secondary | ICD-10-CM | POA: Diagnosis not present

## 2018-12-31 DIAGNOSIS — F209 Schizophrenia, unspecified: Secondary | ICD-10-CM | POA: Diagnosis not present

## 2019-01-01 DIAGNOSIS — G309 Alzheimer's disease, unspecified: Secondary | ICD-10-CM | POA: Diagnosis not present

## 2019-01-01 DIAGNOSIS — F2089 Other schizophrenia: Secondary | ICD-10-CM | POA: Diagnosis not present

## 2019-01-01 DIAGNOSIS — F209 Schizophrenia, unspecified: Secondary | ICD-10-CM | POA: Diagnosis not present

## 2019-01-01 DIAGNOSIS — F0280 Dementia in other diseases classified elsewhere without behavioral disturbance: Secondary | ICD-10-CM | POA: Diagnosis not present

## 2019-01-02 DIAGNOSIS — G309 Alzheimer's disease, unspecified: Secondary | ICD-10-CM | POA: Diagnosis not present

## 2019-01-02 DIAGNOSIS — F209 Schizophrenia, unspecified: Secondary | ICD-10-CM | POA: Diagnosis not present

## 2019-01-02 DIAGNOSIS — Z79899 Other long term (current) drug therapy: Secondary | ICD-10-CM | POA: Diagnosis not present

## 2019-01-02 DIAGNOSIS — E559 Vitamin D deficiency, unspecified: Secondary | ICD-10-CM | POA: Diagnosis not present

## 2019-01-02 DIAGNOSIS — R32 Unspecified urinary incontinence: Secondary | ICD-10-CM | POA: Diagnosis not present

## 2019-01-03 DIAGNOSIS — F209 Schizophrenia, unspecified: Secondary | ICD-10-CM | POA: Diagnosis not present

## 2019-01-04 DIAGNOSIS — F209 Schizophrenia, unspecified: Secondary | ICD-10-CM | POA: Diagnosis not present

## 2019-01-05 DIAGNOSIS — F209 Schizophrenia, unspecified: Secondary | ICD-10-CM | POA: Diagnosis not present

## 2019-01-06 DIAGNOSIS — F209 Schizophrenia, unspecified: Secondary | ICD-10-CM | POA: Diagnosis not present

## 2019-01-07 DIAGNOSIS — F209 Schizophrenia, unspecified: Secondary | ICD-10-CM | POA: Diagnosis not present

## 2019-01-08 DIAGNOSIS — F209 Schizophrenia, unspecified: Secondary | ICD-10-CM | POA: Diagnosis not present

## 2019-01-09 DIAGNOSIS — F209 Schizophrenia, unspecified: Secondary | ICD-10-CM | POA: Diagnosis not present

## 2019-01-10 DIAGNOSIS — F209 Schizophrenia, unspecified: Secondary | ICD-10-CM | POA: Diagnosis not present

## 2019-01-11 DIAGNOSIS — F209 Schizophrenia, unspecified: Secondary | ICD-10-CM | POA: Diagnosis not present

## 2019-01-12 DIAGNOSIS — F209 Schizophrenia, unspecified: Secondary | ICD-10-CM | POA: Diagnosis not present

## 2019-01-13 DIAGNOSIS — F209 Schizophrenia, unspecified: Secondary | ICD-10-CM | POA: Diagnosis not present

## 2019-01-14 DIAGNOSIS — F209 Schizophrenia, unspecified: Secondary | ICD-10-CM | POA: Diagnosis not present

## 2019-01-15 DIAGNOSIS — F209 Schizophrenia, unspecified: Secondary | ICD-10-CM | POA: Diagnosis not present

## 2019-01-16 DIAGNOSIS — F209 Schizophrenia, unspecified: Secondary | ICD-10-CM | POA: Diagnosis not present

## 2019-01-17 DIAGNOSIS — F209 Schizophrenia, unspecified: Secondary | ICD-10-CM | POA: Diagnosis not present

## 2019-01-18 DIAGNOSIS — F209 Schizophrenia, unspecified: Secondary | ICD-10-CM | POA: Diagnosis not present

## 2019-01-19 DIAGNOSIS — F209 Schizophrenia, unspecified: Secondary | ICD-10-CM | POA: Diagnosis not present

## 2019-01-20 DIAGNOSIS — F209 Schizophrenia, unspecified: Secondary | ICD-10-CM | POA: Diagnosis not present

## 2019-01-21 DIAGNOSIS — F209 Schizophrenia, unspecified: Secondary | ICD-10-CM | POA: Diagnosis not present

## 2019-01-22 DIAGNOSIS — F209 Schizophrenia, unspecified: Secondary | ICD-10-CM | POA: Diagnosis not present

## 2019-01-23 DIAGNOSIS — F209 Schizophrenia, unspecified: Secondary | ICD-10-CM | POA: Diagnosis not present

## 2019-01-24 DIAGNOSIS — F209 Schizophrenia, unspecified: Secondary | ICD-10-CM | POA: Diagnosis not present

## 2019-01-25 DIAGNOSIS — F209 Schizophrenia, unspecified: Secondary | ICD-10-CM | POA: Diagnosis not present

## 2019-01-26 DIAGNOSIS — F209 Schizophrenia, unspecified: Secondary | ICD-10-CM | POA: Diagnosis not present

## 2019-01-27 DIAGNOSIS — F209 Schizophrenia, unspecified: Secondary | ICD-10-CM | POA: Diagnosis not present

## 2019-01-28 DIAGNOSIS — F209 Schizophrenia, unspecified: Secondary | ICD-10-CM | POA: Diagnosis not present

## 2019-01-29 DIAGNOSIS — F0280 Dementia in other diseases classified elsewhere without behavioral disturbance: Secondary | ICD-10-CM | POA: Diagnosis not present

## 2019-01-29 DIAGNOSIS — F2089 Other schizophrenia: Secondary | ICD-10-CM | POA: Diagnosis not present

## 2019-01-29 DIAGNOSIS — G309 Alzheimer's disease, unspecified: Secondary | ICD-10-CM | POA: Diagnosis not present

## 2019-01-29 DIAGNOSIS — F209 Schizophrenia, unspecified: Secondary | ICD-10-CM | POA: Diagnosis not present

## 2019-01-30 DIAGNOSIS — R32 Unspecified urinary incontinence: Secondary | ICD-10-CM | POA: Diagnosis not present

## 2019-01-30 DIAGNOSIS — F209 Schizophrenia, unspecified: Secondary | ICD-10-CM | POA: Diagnosis not present

## 2019-01-30 DIAGNOSIS — G309 Alzheimer's disease, unspecified: Secondary | ICD-10-CM | POA: Diagnosis not present

## 2019-01-31 DIAGNOSIS — F209 Schizophrenia, unspecified: Secondary | ICD-10-CM | POA: Diagnosis not present

## 2019-02-01 DIAGNOSIS — F209 Schizophrenia, unspecified: Secondary | ICD-10-CM | POA: Diagnosis not present

## 2019-02-02 DIAGNOSIS — F209 Schizophrenia, unspecified: Secondary | ICD-10-CM | POA: Diagnosis not present

## 2019-02-08 DIAGNOSIS — R790 Abnormal level of blood mineral: Secondary | ICD-10-CM | POA: Diagnosis not present

## 2019-02-08 DIAGNOSIS — M1990 Unspecified osteoarthritis, unspecified site: Secondary | ICD-10-CM | POA: Diagnosis not present

## 2019-02-08 DIAGNOSIS — M810 Age-related osteoporosis without current pathological fracture: Secondary | ICD-10-CM | POA: Diagnosis not present

## 2019-02-08 DIAGNOSIS — J029 Acute pharyngitis, unspecified: Secondary | ICD-10-CM | POA: Diagnosis not present

## 2019-02-26 DIAGNOSIS — G309 Alzheimer's disease, unspecified: Secondary | ICD-10-CM | POA: Diagnosis not present

## 2019-02-26 DIAGNOSIS — F2089 Other schizophrenia: Secondary | ICD-10-CM | POA: Diagnosis not present

## 2019-02-26 DIAGNOSIS — F0280 Dementia in other diseases classified elsewhere without behavioral disturbance: Secondary | ICD-10-CM | POA: Diagnosis not present

## 2019-02-27 DIAGNOSIS — R32 Unspecified urinary incontinence: Secondary | ICD-10-CM | POA: Diagnosis not present

## 2019-02-27 DIAGNOSIS — G309 Alzheimer's disease, unspecified: Secondary | ICD-10-CM | POA: Diagnosis not present

## 2019-04-02 DIAGNOSIS — F2089 Other schizophrenia: Secondary | ICD-10-CM | POA: Diagnosis not present

## 2019-04-02 DIAGNOSIS — F0280 Dementia in other diseases classified elsewhere without behavioral disturbance: Secondary | ICD-10-CM | POA: Diagnosis not present

## 2019-04-02 DIAGNOSIS — G309 Alzheimer's disease, unspecified: Secondary | ICD-10-CM | POA: Diagnosis not present

## 2019-04-05 DIAGNOSIS — F0390 Unspecified dementia without behavioral disturbance: Secondary | ICD-10-CM | POA: Diagnosis not present

## 2019-04-05 DIAGNOSIS — H04129 Dry eye syndrome of unspecified lacrimal gland: Secondary | ICD-10-CM | POA: Diagnosis not present

## 2019-04-05 DIAGNOSIS — J449 Chronic obstructive pulmonary disease, unspecified: Secondary | ICD-10-CM | POA: Diagnosis not present

## 2019-04-05 DIAGNOSIS — F209 Schizophrenia, unspecified: Secondary | ICD-10-CM | POA: Diagnosis not present

## 2019-04-07 DIAGNOSIS — J449 Chronic obstructive pulmonary disease, unspecified: Secondary | ICD-10-CM | POA: Diagnosis not present

## 2019-04-07 DIAGNOSIS — H04129 Dry eye syndrome of unspecified lacrimal gland: Secondary | ICD-10-CM | POA: Diagnosis not present

## 2019-04-07 DIAGNOSIS — F0390 Unspecified dementia without behavioral disturbance: Secondary | ICD-10-CM | POA: Diagnosis not present

## 2019-04-07 DIAGNOSIS — F209 Schizophrenia, unspecified: Secondary | ICD-10-CM | POA: Diagnosis not present

## 2019-04-14 DIAGNOSIS — F209 Schizophrenia, unspecified: Secondary | ICD-10-CM | POA: Diagnosis not present

## 2019-04-15 DIAGNOSIS — F209 Schizophrenia, unspecified: Secondary | ICD-10-CM | POA: Diagnosis not present

## 2019-04-16 DIAGNOSIS — F209 Schizophrenia, unspecified: Secondary | ICD-10-CM | POA: Diagnosis not present

## 2019-04-17 DIAGNOSIS — F209 Schizophrenia, unspecified: Secondary | ICD-10-CM | POA: Diagnosis not present

## 2019-04-18 DIAGNOSIS — F209 Schizophrenia, unspecified: Secondary | ICD-10-CM | POA: Diagnosis not present

## 2019-04-19 DIAGNOSIS — F209 Schizophrenia, unspecified: Secondary | ICD-10-CM | POA: Diagnosis not present

## 2019-04-20 DIAGNOSIS — F209 Schizophrenia, unspecified: Secondary | ICD-10-CM | POA: Diagnosis not present

## 2019-04-21 DIAGNOSIS — F209 Schizophrenia, unspecified: Secondary | ICD-10-CM | POA: Diagnosis not present

## 2019-04-22 DIAGNOSIS — F209 Schizophrenia, unspecified: Secondary | ICD-10-CM | POA: Diagnosis not present

## 2019-04-23 DIAGNOSIS — F209 Schizophrenia, unspecified: Secondary | ICD-10-CM | POA: Diagnosis not present

## 2019-04-24 DIAGNOSIS — F209 Schizophrenia, unspecified: Secondary | ICD-10-CM | POA: Diagnosis not present

## 2019-04-25 DIAGNOSIS — F209 Schizophrenia, unspecified: Secondary | ICD-10-CM | POA: Diagnosis not present

## 2019-04-26 DIAGNOSIS — F209 Schizophrenia, unspecified: Secondary | ICD-10-CM | POA: Diagnosis not present

## 2019-04-27 DIAGNOSIS — F209 Schizophrenia, unspecified: Secondary | ICD-10-CM | POA: Diagnosis not present

## 2019-04-27 DIAGNOSIS — R32 Unspecified urinary incontinence: Secondary | ICD-10-CM | POA: Diagnosis not present

## 2019-04-27 DIAGNOSIS — G309 Alzheimer's disease, unspecified: Secondary | ICD-10-CM | POA: Diagnosis not present

## 2019-05-01 DIAGNOSIS — F2089 Other schizophrenia: Secondary | ICD-10-CM | POA: Diagnosis not present

## 2019-05-10 DIAGNOSIS — K219 Gastro-esophageal reflux disease without esophagitis: Secondary | ICD-10-CM | POA: Diagnosis not present

## 2019-05-10 DIAGNOSIS — F5109 Other insomnia not due to a substance or known physiological condition: Secondary | ICD-10-CM | POA: Diagnosis not present

## 2019-05-10 DIAGNOSIS — K5909 Other constipation: Secondary | ICD-10-CM | POA: Diagnosis not present

## 2019-05-10 DIAGNOSIS — R030 Elevated blood-pressure reading, without diagnosis of hypertension: Secondary | ICD-10-CM | POA: Diagnosis not present

## 2019-05-12 DIAGNOSIS — F209 Schizophrenia, unspecified: Secondary | ICD-10-CM | POA: Diagnosis not present

## 2019-05-13 DIAGNOSIS — F209 Schizophrenia, unspecified: Secondary | ICD-10-CM | POA: Diagnosis not present

## 2019-05-14 DIAGNOSIS — F209 Schizophrenia, unspecified: Secondary | ICD-10-CM | POA: Diagnosis not present

## 2019-05-15 DIAGNOSIS — F209 Schizophrenia, unspecified: Secondary | ICD-10-CM | POA: Diagnosis not present

## 2019-05-16 DIAGNOSIS — F209 Schizophrenia, unspecified: Secondary | ICD-10-CM | POA: Diagnosis not present

## 2019-05-17 DIAGNOSIS — F209 Schizophrenia, unspecified: Secondary | ICD-10-CM | POA: Diagnosis not present

## 2019-05-18 DIAGNOSIS — F209 Schizophrenia, unspecified: Secondary | ICD-10-CM | POA: Diagnosis not present

## 2019-05-19 DIAGNOSIS — F209 Schizophrenia, unspecified: Secondary | ICD-10-CM | POA: Diagnosis not present

## 2019-05-20 DIAGNOSIS — F209 Schizophrenia, unspecified: Secondary | ICD-10-CM | POA: Diagnosis not present

## 2019-05-21 DIAGNOSIS — F209 Schizophrenia, unspecified: Secondary | ICD-10-CM | POA: Diagnosis not present

## 2019-05-22 DIAGNOSIS — F209 Schizophrenia, unspecified: Secondary | ICD-10-CM | POA: Diagnosis not present

## 2019-05-23 DIAGNOSIS — F209 Schizophrenia, unspecified: Secondary | ICD-10-CM | POA: Diagnosis not present

## 2019-05-24 DIAGNOSIS — G308 Other Alzheimer's disease: Secondary | ICD-10-CM | POA: Diagnosis not present

## 2019-05-24 DIAGNOSIS — K5909 Other constipation: Secondary | ICD-10-CM | POA: Diagnosis not present

## 2019-05-24 DIAGNOSIS — M1990 Unspecified osteoarthritis, unspecified site: Secondary | ICD-10-CM | POA: Diagnosis not present

## 2019-05-24 DIAGNOSIS — F209 Schizophrenia, unspecified: Secondary | ICD-10-CM | POA: Diagnosis not present

## 2019-05-24 DIAGNOSIS — M810 Age-related osteoporosis without current pathological fracture: Secondary | ICD-10-CM | POA: Diagnosis not present

## 2019-05-25 DIAGNOSIS — K5909 Other constipation: Secondary | ICD-10-CM | POA: Diagnosis not present

## 2019-05-25 DIAGNOSIS — M810 Age-related osteoporosis without current pathological fracture: Secondary | ICD-10-CM | POA: Diagnosis not present

## 2019-05-25 DIAGNOSIS — G308 Other Alzheimer's disease: Secondary | ICD-10-CM | POA: Diagnosis not present

## 2019-05-25 DIAGNOSIS — H25813 Combined forms of age-related cataract, bilateral: Secondary | ICD-10-CM | POA: Diagnosis not present

## 2019-05-25 DIAGNOSIS — M1990 Unspecified osteoarthritis, unspecified site: Secondary | ICD-10-CM | POA: Diagnosis not present

## 2019-05-25 DIAGNOSIS — F209 Schizophrenia, unspecified: Secondary | ICD-10-CM | POA: Diagnosis not present

## 2019-05-31 DIAGNOSIS — R32 Unspecified urinary incontinence: Secondary | ICD-10-CM | POA: Diagnosis not present

## 2019-05-31 DIAGNOSIS — G309 Alzheimer's disease, unspecified: Secondary | ICD-10-CM | POA: Diagnosis not present

## 2019-06-04 DIAGNOSIS — F2089 Other schizophrenia: Secondary | ICD-10-CM | POA: Diagnosis not present

## 2019-07-02 DIAGNOSIS — F2089 Other schizophrenia: Secondary | ICD-10-CM | POA: Diagnosis not present

## 2019-07-12 DIAGNOSIS — F329 Major depressive disorder, single episode, unspecified: Secondary | ICD-10-CM | POA: Diagnosis not present

## 2019-07-12 DIAGNOSIS — J449 Chronic obstructive pulmonary disease, unspecified: Secondary | ICD-10-CM | POA: Diagnosis not present

## 2019-07-12 DIAGNOSIS — F209 Schizophrenia, unspecified: Secondary | ICD-10-CM | POA: Diagnosis not present

## 2019-07-12 DIAGNOSIS — H04129 Dry eye syndrome of unspecified lacrimal gland: Secondary | ICD-10-CM | POA: Diagnosis not present

## 2019-08-02 DIAGNOSIS — R32 Unspecified urinary incontinence: Secondary | ICD-10-CM | POA: Diagnosis not present

## 2019-08-02 DIAGNOSIS — G309 Alzheimer's disease, unspecified: Secondary | ICD-10-CM | POA: Diagnosis not present

## 2019-08-06 DIAGNOSIS — F2089 Other schizophrenia: Secondary | ICD-10-CM | POA: Diagnosis not present

## 2019-08-30 DIAGNOSIS — F0390 Unspecified dementia without behavioral disturbance: Secondary | ICD-10-CM | POA: Diagnosis not present

## 2019-08-30 DIAGNOSIS — D7589 Other specified diseases of blood and blood-forming organs: Secondary | ICD-10-CM | POA: Diagnosis not present

## 2019-08-30 DIAGNOSIS — D6489 Other specified anemias: Secondary | ICD-10-CM | POA: Diagnosis not present

## 2019-08-30 DIAGNOSIS — E7220 Disorder of urea cycle metabolism, unspecified: Secondary | ICD-10-CM | POA: Diagnosis not present

## 2019-08-30 DIAGNOSIS — B351 Tinea unguium: Secondary | ICD-10-CM | POA: Diagnosis not present

## 2019-09-03 DIAGNOSIS — G309 Alzheimer's disease, unspecified: Secondary | ICD-10-CM | POA: Diagnosis not present

## 2019-09-03 DIAGNOSIS — F0280 Dementia in other diseases classified elsewhere without behavioral disturbance: Secondary | ICD-10-CM | POA: Diagnosis not present

## 2019-09-03 DIAGNOSIS — F2089 Other schizophrenia: Secondary | ICD-10-CM | POA: Diagnosis not present

## 2019-09-03 DIAGNOSIS — R32 Unspecified urinary incontinence: Secondary | ICD-10-CM | POA: Diagnosis not present

## 2019-09-10 DIAGNOSIS — M810 Age-related osteoporosis without current pathological fracture: Secondary | ICD-10-CM | POA: Diagnosis not present

## 2019-09-10 DIAGNOSIS — D6489 Other specified anemias: Secondary | ICD-10-CM | POA: Diagnosis not present

## 2019-09-10 DIAGNOSIS — F209 Schizophrenia, unspecified: Secondary | ICD-10-CM | POA: Diagnosis not present

## 2019-09-10 DIAGNOSIS — Z79899 Other long term (current) drug therapy: Secondary | ICD-10-CM | POA: Diagnosis not present

## 2019-09-10 DIAGNOSIS — E559 Vitamin D deficiency, unspecified: Secondary | ICD-10-CM | POA: Diagnosis not present

## 2019-09-10 DIAGNOSIS — J449 Chronic obstructive pulmonary disease, unspecified: Secondary | ICD-10-CM | POA: Diagnosis not present

## 2019-09-28 DIAGNOSIS — G309 Alzheimer's disease, unspecified: Secondary | ICD-10-CM | POA: Diagnosis not present

## 2019-09-28 DIAGNOSIS — R32 Unspecified urinary incontinence: Secondary | ICD-10-CM | POA: Diagnosis not present

## 2019-10-01 DIAGNOSIS — F2089 Other schizophrenia: Secondary | ICD-10-CM | POA: Diagnosis not present

## 2019-10-01 DIAGNOSIS — F0280 Dementia in other diseases classified elsewhere without behavioral disturbance: Secondary | ICD-10-CM | POA: Diagnosis not present

## 2019-10-01 DIAGNOSIS — G309 Alzheimer's disease, unspecified: Secondary | ICD-10-CM | POA: Diagnosis not present

## 2019-10-11 DIAGNOSIS — M25551 Pain in right hip: Secondary | ICD-10-CM | POA: Diagnosis not present

## 2019-10-11 DIAGNOSIS — D6489 Other specified anemias: Secondary | ICD-10-CM | POA: Diagnosis not present

## 2019-10-11 DIAGNOSIS — R2681 Unsteadiness on feet: Secondary | ICD-10-CM | POA: Diagnosis not present

## 2019-10-11 DIAGNOSIS — N179 Acute kidney failure, unspecified: Secondary | ICD-10-CM | POA: Diagnosis not present

## 2019-10-11 DIAGNOSIS — M25552 Pain in left hip: Secondary | ICD-10-CM | POA: Diagnosis not present

## 2019-10-11 DIAGNOSIS — R451 Restlessness and agitation: Secondary | ICD-10-CM | POA: Diagnosis not present

## 2019-10-12 DIAGNOSIS — M25559 Pain in unspecified hip: Secondary | ICD-10-CM | POA: Diagnosis not present

## 2019-10-12 DIAGNOSIS — R269 Unspecified abnormalities of gait and mobility: Secondary | ICD-10-CM | POA: Diagnosis not present

## 2019-10-12 DIAGNOSIS — R451 Restlessness and agitation: Secondary | ICD-10-CM | POA: Diagnosis not present

## 2019-10-12 DIAGNOSIS — M25552 Pain in left hip: Secondary | ICD-10-CM | POA: Diagnosis not present

## 2019-10-12 DIAGNOSIS — M25551 Pain in right hip: Secondary | ICD-10-CM | POA: Diagnosis not present

## 2019-10-12 DIAGNOSIS — D6489 Other specified anemias: Secondary | ICD-10-CM | POA: Diagnosis not present

## 2019-10-25 DIAGNOSIS — M25551 Pain in right hip: Secondary | ICD-10-CM | POA: Diagnosis not present

## 2019-10-25 DIAGNOSIS — M25552 Pain in left hip: Secondary | ICD-10-CM | POA: Diagnosis not present

## 2019-10-25 DIAGNOSIS — M810 Age-related osteoporosis without current pathological fracture: Secondary | ICD-10-CM | POA: Diagnosis not present

## 2019-10-25 DIAGNOSIS — R030 Elevated blood-pressure reading, without diagnosis of hypertension: Secondary | ICD-10-CM | POA: Diagnosis not present

## 2019-10-25 DIAGNOSIS — Z1159 Encounter for screening for other viral diseases: Secondary | ICD-10-CM | POA: Diagnosis not present

## 2019-10-25 DIAGNOSIS — Z20828 Contact with and (suspected) exposure to other viral communicable diseases: Secondary | ICD-10-CM | POA: Diagnosis not present

## 2019-10-25 DIAGNOSIS — M1990 Unspecified osteoarthritis, unspecified site: Secondary | ICD-10-CM | POA: Diagnosis not present

## 2019-10-25 DIAGNOSIS — G20 Parkinson's disease: Secondary | ICD-10-CM | POA: Diagnosis not present

## 2019-10-31 DIAGNOSIS — Z20828 Contact with and (suspected) exposure to other viral communicable diseases: Secondary | ICD-10-CM | POA: Diagnosis not present

## 2019-10-31 DIAGNOSIS — G309 Alzheimer's disease, unspecified: Secondary | ICD-10-CM | POA: Diagnosis not present

## 2019-10-31 DIAGNOSIS — Z1159 Encounter for screening for other viral diseases: Secondary | ICD-10-CM | POA: Diagnosis not present

## 2019-10-31 DIAGNOSIS — R32 Unspecified urinary incontinence: Secondary | ICD-10-CM | POA: Diagnosis not present

## 2019-11-05 DIAGNOSIS — F0280 Dementia in other diseases classified elsewhere without behavioral disturbance: Secondary | ICD-10-CM | POA: Diagnosis not present

## 2019-11-05 DIAGNOSIS — G309 Alzheimer's disease, unspecified: Secondary | ICD-10-CM | POA: Diagnosis not present

## 2019-11-05 DIAGNOSIS — F2089 Other schizophrenia: Secondary | ICD-10-CM | POA: Diagnosis not present

## 2019-11-29 DIAGNOSIS — K219 Gastro-esophageal reflux disease without esophagitis: Secondary | ICD-10-CM | POA: Diagnosis not present

## 2019-11-29 DIAGNOSIS — K5909 Other constipation: Secondary | ICD-10-CM | POA: Diagnosis not present

## 2019-11-29 DIAGNOSIS — J449 Chronic obstructive pulmonary disease, unspecified: Secondary | ICD-10-CM | POA: Diagnosis not present

## 2019-11-29 DIAGNOSIS — F209 Schizophrenia, unspecified: Secondary | ICD-10-CM | POA: Diagnosis not present

## 2019-11-30 DIAGNOSIS — K5909 Other constipation: Secondary | ICD-10-CM | POA: Diagnosis not present

## 2019-11-30 DIAGNOSIS — R32 Unspecified urinary incontinence: Secondary | ICD-10-CM | POA: Diagnosis not present

## 2019-11-30 DIAGNOSIS — K219 Gastro-esophageal reflux disease without esophagitis: Secondary | ICD-10-CM | POA: Diagnosis not present

## 2019-11-30 DIAGNOSIS — J449 Chronic obstructive pulmonary disease, unspecified: Secondary | ICD-10-CM | POA: Diagnosis not present

## 2019-11-30 DIAGNOSIS — G309 Alzheimer's disease, unspecified: Secondary | ICD-10-CM | POA: Diagnosis not present

## 2019-11-30 DIAGNOSIS — F209 Schizophrenia, unspecified: Secondary | ICD-10-CM | POA: Diagnosis not present

## 2019-12-03 DIAGNOSIS — F2089 Other schizophrenia: Secondary | ICD-10-CM | POA: Diagnosis not present

## 2019-12-21 DIAGNOSIS — H524 Presbyopia: Secondary | ICD-10-CM | POA: Diagnosis not present

## 2019-12-31 DIAGNOSIS — F0280 Dementia in other diseases classified elsewhere without behavioral disturbance: Secondary | ICD-10-CM | POA: Diagnosis not present

## 2019-12-31 DIAGNOSIS — G309 Alzheimer's disease, unspecified: Secondary | ICD-10-CM | POA: Diagnosis not present

## 2019-12-31 DIAGNOSIS — F2089 Other schizophrenia: Secondary | ICD-10-CM | POA: Diagnosis not present

## 2020-01-01 DIAGNOSIS — R32 Unspecified urinary incontinence: Secondary | ICD-10-CM | POA: Diagnosis not present

## 2020-01-01 DIAGNOSIS — G309 Alzheimer's disease, unspecified: Secondary | ICD-10-CM | POA: Diagnosis not present

## 2020-01-07 DIAGNOSIS — F209 Schizophrenia, unspecified: Secondary | ICD-10-CM | POA: Diagnosis not present

## 2020-01-07 DIAGNOSIS — M1990 Unspecified osteoarthritis, unspecified site: Secondary | ICD-10-CM | POA: Diagnosis not present

## 2020-01-07 DIAGNOSIS — J449 Chronic obstructive pulmonary disease, unspecified: Secondary | ICD-10-CM | POA: Diagnosis not present

## 2020-01-07 DIAGNOSIS — Z79899 Other long term (current) drug therapy: Secondary | ICD-10-CM | POA: Diagnosis not present

## 2020-01-07 DIAGNOSIS — E559 Vitamin D deficiency, unspecified: Secondary | ICD-10-CM | POA: Diagnosis not present

## 2020-01-13 DIAGNOSIS — R079 Chest pain, unspecified: Secondary | ICD-10-CM | POA: Diagnosis not present

## 2020-01-17 DIAGNOSIS — F0390 Unspecified dementia without behavioral disturbance: Secondary | ICD-10-CM | POA: Diagnosis not present

## 2020-01-17 DIAGNOSIS — D6489 Other specified anemias: Secondary | ICD-10-CM | POA: Diagnosis not present

## 2020-01-17 DIAGNOSIS — E7220 Disorder of urea cycle metabolism, unspecified: Secondary | ICD-10-CM | POA: Diagnosis not present

## 2020-01-17 DIAGNOSIS — B351 Tinea unguium: Secondary | ICD-10-CM | POA: Diagnosis not present

## 2020-01-17 DIAGNOSIS — R739 Hyperglycemia, unspecified: Secondary | ICD-10-CM | POA: Diagnosis not present

## 2020-01-28 DIAGNOSIS — Z79899 Other long term (current) drug therapy: Secondary | ICD-10-CM | POA: Diagnosis not present

## 2020-01-28 DIAGNOSIS — F2089 Other schizophrenia: Secondary | ICD-10-CM | POA: Diagnosis not present

## 2020-01-29 DIAGNOSIS — Z79899 Other long term (current) drug therapy: Secondary | ICD-10-CM | POA: Diagnosis not present

## 2020-01-31 NOTE — Telephone Encounter (Signed)
Penny from [facility] called and stated patient arrived to them from [facility]..since he has been there he has had some high blood sugars over the 500s. She does have a sliding scale for insuline but the MD he sees is in [facility] and they need him seen and established to get him some help with this.    He will follow up with Ramon Dredge as pcp    Future Appointments   Date Time Provider Department Center   02/01/2020  8:00 AM Eveline Keto, PA [facility] INT MED           Packet Mailed

## 2020-01-31 NOTE — Telephone Encounter (Signed)
New Patient scheduled 24 hrs ago.  No external records.

## 2020-02-01 ENCOUNTER — Ambulatory Visit: Attending: Physician Assistant | Primary: Registered Nurse

## 2020-02-01 ENCOUNTER — Ambulatory Visit: Admit: 2020-02-01 | Discharge: 2020-02-01 | Payer: MEDICARE | Attending: Physician Assistant | Primary: Registered Nurse

## 2020-02-01 DIAGNOSIS — E119 Type 2 diabetes mellitus without complications: Secondary | ICD-10-CM

## 2020-02-01 DIAGNOSIS — G309 Alzheimer's disease, unspecified: Secondary | ICD-10-CM | POA: Diagnosis not present

## 2020-02-01 DIAGNOSIS — R32 Unspecified urinary incontinence: Secondary | ICD-10-CM | POA: Diagnosis not present

## 2020-02-01 NOTE — Assessment & Plan Note (Signed)
Patient states he is Type 1 diabetic since age of 79y/o. Medical records state he is Type 2. He is insulin dependent with long-acting bid and SSI Novolog. I will refer him to [facility] Diabetes Endocrine NP for further management.

## 2020-02-01 NOTE — Assessment & Plan Note (Signed)
Last evaluated by Alejandro Heidelberg, APRN for "[facility]" for the fcaility [facility] in [facility]. He was tarted on donezepil 5mg  q hs x 4 weeks as a trial with a goal of monitoring MCA scores.

## 2020-02-01 NOTE — Progress Notes (Signed)
[facility] INTERNAL MEDICINE   [address] [place] [address]  [phone]    ASSESSMENT AND PLAN   Diagnoses and all orders for this visit:    1. Type 2 diabetes mellitus treated with insulin (HCC)-is listed in [facility]. We will review PCP records when they arrive and reconcile these with [facility] and our records. I was able to reach and speak to his niece Catalino Plascencia and she is agreeable to helping [place] get medical documentation from former facilities "[facility]" and his PCP. She said his brother Zackarey Holleman will also be useful in conveying PMHx for Bill. She is agreeable with a referral to [facility] DM RN for assistance with insulin management. Gershon Cull will fax me the Novolog SSI they are using currently. See patient instructions. After we get 5 days of blood sugar reads we will be able to better tell how elevated his blood sugars consistently are and make changes to his Levemir.   Assessment & Plan:  Patient states he is Type 1 diabetic since age of 79y/o. Medical records state he is Type 2. He is insulin dependent with long-acting bid and SSI Novolog. I will refer him to [facility] Diabetes Endocrine NP for further management.    Orders:  -     REFERRAL TO DIABETES TX CTR    2. Dementia without behavioral disturbance, unspecified dementia type (HCC)-We are short on factual data from medical records today to fully outline his pmhx. Though Annette Stable is conversant, little can be gleaned from him on this subject. I will leave it to his PCP but Annette Stable will likely also benefit from a referral to [facility] for dementia med management and follow up.   Assessment & Plan:  Last evaluated by Alvie Heidelberg, APRN for "[facility]" for the fcaility [facility] in [facility]. He was started on donezepil 5mg  q hs x 4 weeks as a trial with a goal of monitoring MCA scores.     After the appointment, I received info faxed to me from [facility] of a psychiatric eval done at  [facility] in [place]. I used this to fill in the chart as much as I could. This packet was then placed into scanning. So far, no other info was provided to supplement this electronic records.        New Patient Total Time (120) minutes was spent on this date of the encounter for the following:    --preparing to see the patient  --obtaining and/or reviewing separately obtained history [facility] psychiatric assessment)  --performing a medically appropriate exam and/or evaluation  --counseling and educating the patient and caregiver [employer] from [place] in person and niece POA by phone  --Ordering tests-QID BS checks x 5 days  --documenting clinical information in the electronic health records  --care coordination At least [place] time spent talking with Priscella niece and attempting to acquire info from [facility] facility  CHIEF COMPLAINT   Alejandro [name] is a 79 y.o. male who presents to clinic for High Blood Sugar.    HPI   Damonte Chrissie Noa) is a patient of Biomedical scientist, FNP who is here from [facility] with a care worker [place]. Penny from the facility called yesterday to say [place] arrived from [facility] and has had some very high BS reads into the 500s. They have a SSI by his [facility] doctor but they need Bill to be seen sooner than his NP establish  visit May 4th, 2021.    Bill came to [facility] 2 days ago with his brother Mohd Clemons. He is a transfer from [employer] at the [place], [address], [place]. We have no records at the time of my visit with Bill. Gershon Cull states Annette Stable has had high blood reads.  His BS on arrival at 5:30p.m. on Wednesday was 366. At 1:00 p.m. yesterday afternoon it was 526. They tried to call his provider in [place] but was forced to leave a message and they have not heard back.  His prior PCP was Dr. Marjean Donna at [place], [place] [phone]. This is when they called to get him an appt here.   His BS in office today  243. There are following Levemir and a SSI  NovoLog orders left by the former PCP.  They have a MAR left over from "[facility]" that states he is taking Levemir 20U SC bid and Novolog SSI but with no parameters written. Gershon Cull states she will get this for me when she returns to [place] with [employer].    She says a POA Kennyth Arnold [phone]).  His brother is Blane Worthington [phone]. He is also a POA. Ottie Glazier # is [phone]    Covid test negative on 01-28-20 done at [facility].     Annette Stable is states he has not fallen recently. He does not want to walk with an assistive device of any kind. He admits to deconditioning of his legs, however. He tells me he is a Type 1 diabetic which he's had since age 87y. He tells me he's never had a stroke.     Allergies   Allergen Reactions   ??? Shellfish Derived Anaphylaxis      Current Outpatient Medications on File Prior to Visit   Medication Sig Dispense Refill   ??? acetaminophen (TYLENOL) 325 mg tablet Take 325 mg by mouth every four (4) hours as needed for Pain.     ??? bisacodyL (Dulcolax, bisacodyl,) 10 mg supp Insert 10 mg into rectum. Prn for constipation if no results for milk of magnesia     ??? mineral oil (FLEET) enema Insert  into rectum now.     ??? ibuprofen (MOTRIN) 400 mg tablet Take 400 mg by mouth every six (6) hours as needed for Pain.     ??? magnesium citrate solution Take 296 mL by mouth now. Prn if no results after enema. If no results in 1 hour of completion of bowl protocol, call MD immediately for further orders.      ??? magnesium hydroxide (Phillips Milk of Magnesia) 400 mg/5 mL suspension Take 30 mL by mouth daily as needed for Constipation. Prn if no BM in 3 days     ??? benazepriL (LOTENSIN) 5 mg tablet Take 5 mg by mouth two (2) times a day.     ??? cetirizine (ZYRTEC) 10 mg tablet Take 10 mg by mouth daily.     ??? donepeziL (ARICEPT) 5 mg tablet Take  by mouth nightly.     ??? apixaban (Eliquis) 2.5 mg tablet Take 2.5 mg by mouth two (2) times a day.     ???  glucagon (GlucaGen HypoKit) 1 mg injection 1 mg by IntraVENous route once.     ??? insulin detemir U-100 (Levemir U-100 Insulin) 100 unit/mL injection 20 Units by SubCUTAneous route two (2) times a day.     ??? sodium phosphate,mono-dibasic (FLEET ENEMA RE) Insert  into rectum. Insert 1 enema rectally daily  prn for constipation if no results day after suppository     ??? QUEtiapine (SEROquel) 25 mg tablet Take 1 Tab by mouth two (2) times a day. For psychosis     ??? b complex-vitamin c-folic acid 0.8 mg (NEPHRO-VITE) 0.8 mg tab tablet Take 1 Tab by mouth daily.     ??? polyvinyl alcohol/povidone (ARTIFICIAL TEARS OP) Apply  to eye. Instill 2 gtts in ou every 6 hours prn for dry eyes     ??? insulin aspart U-100 (NOVOLOG) 100 unit/mL injection by SubCUTAneous route. For blood sugar ac and hs for glucose control, related to Type 2 DM.     0-150 = 0 units under 60 call MD;  151-200 = 2 units  201-250 = 4 units  251-300 = 6 units  301-350 = 8 units  351-400 = 10 units  Greater 400 call MD     ??? methocarbamoL (ROBAXIN) 500 mg tablet Take  by mouth three (3) times daily. Prn muscle spasms       No current facility-administered medications on file prior to visit.      There are no discontinued medications.    Past Medical History:   Diagnosis Date   ??? Dementia (HCC)    ??? Diabetes (HCC)    ??? Grief     loss of wife 2014   ??? Hypertension       History reviewed. No pertinent surgical history.    Social History     Tobacco Use   ??? Smoking status: Never Smoker   ??? Smokeless tobacco: Never Used   Substance Use Topics   ??? Alcohol use: Never     Frequency: Never     Binge frequency: Never   ??? Drug use: Never     History reviewed. No pertinent family history.      REVIEW OF SYSTEMS   Review of Systems   Constitutional: Negative.    HENT: Positive for hearing loss. Negative for congestion, ear discharge, ear pain, nosebleeds, sinus pain, sore throat and tinnitus.    Eyes: Negative.    Respiratory: Negative.  Negative for stridor.     Cardiovascular: Negative.    Gastrointestinal: Negative.    Genitourinary: Negative.    Musculoskeletal: Positive for falls (no recent falls according to the pt. Gershon Cull states she can be unsteady).   Skin: Negative.    Neurological: Negative.    Endo/Heme/Allergies: Negative.    Psychiatric/Behavioral: Positive for memory loss. Negative for depression, hallucinations, substance abuse and suicidal ideas. The patient is not nervous/anxious and does not have insomnia.       PHYSICAL EXAM     Visit Vitals  BP (!) 156/76 (BP 1 Location: Right arm, BP Patient Position: Sitting, BP Cuff Size: Adult)   Pulse 67   Ht 5\' 11"  (1.803 m)   Wt 156 lb 11.2 oz (71.1 kg)   BMI 21.86 kg/m??     Physical Exam  Constitutional:       General: He is not in acute distress.     Appearance: Normal appearance. He is normal weight. He is not ill-appearing, toxic-appearing or diaphoretic.   HENT:      Head: Normocephalic and atraumatic.      Right Ear: Tympanic membrane normal.      Left Ear: Tympanic membrane normal.      Nose: Nose normal. No congestion or rhinorrhea.      Mouth/Throat:      Mouth: Mucous membranes are moist.  Pharynx: Oropharynx is clear. No oropharyngeal exudate or posterior oropharyngeal erythema.   Eyes:      Extraocular Movements: Extraocular movements intact.      Conjunctiva/sclera: Conjunctivae normal.      Pupils: Pupils are equal, round, and reactive to light.   Neck:      Musculoskeletal: Normal range of motion and neck supple. No neck rigidity or muscular tenderness.   Cardiovascular:      Rate and Rhythm: Normal rate and regular rhythm.      Heart sounds: Normal heart sounds. No murmur. No friction rub. No gallop.    Pulmonary:      Effort: Pulmonary effort is normal. No respiratory distress.      Breath sounds: Normal breath sounds. No stridor. No wheezing or rales.   Musculoskeletal:         General: No swelling.      Right lower leg: No edema.      Left lower leg: No edema.      Comments: Patient  appeared a little unsteady while walking around the exam room   Lymphadenopathy:      Cervical: No cervical adenopathy.   Skin:     General: Skin is warm and dry.   Neurological:      General: No focal deficit present.      Mental Status: He is alert and oriented to person, place, and time.   Psychiatric:         Mood and Affect: Mood normal.         Behavior: Behavior normal.        LABS/IMAGING       Follow-up and Dispositions    ?? Return for as previous.       Future Appointments   Date Time Provider [place]   03/11/2020  9:00 AM Duayne Cal, [place], [place]  02/01/2020    This visit was dictated using M*Modal voice recognition software. Please excuse any errors and contact me if you have questions about specific passages that may be erroneous.

## 2020-02-01 NOTE — Patient Instructions (Signed)
POA Kamran Coker [phone] I spoke with her today  POA Alvar Malinoski [phone] I will give him a call today if I have time.  Dr. Marjean Donna [facility] [phone] left a message to ask about obtaining medical records  [facility] [phone] We will also try to reach them for medical information  I will refer bill to [facility] for management of his diabetes.  I will hold on making any med changes today until I understand what he is currently taking and what his blood sugars are for 5 days.     PLEASE CHECK BLOOD SUGARS AC AND HS X 5 DAYS  3-26, 3-27, 3-28, 3-29, 3-30 and call [facility] Medicine [phone] with these reads and how much insulin was given.    Please call or fax [place] today with the sliding scale insulin table you are using to control his blood sugars.     Our fax # to [facility] Internal Medicine-POD A is [phone]  Our tele # to [facility] Internal Medicine is [phone]

## 2020-02-01 NOTE — Telephone Encounter (Signed)
Full tele # for [facility] is [phone]. I LMTCB for Fleet Contras to call me at the [facility] clinic.   Signed By: Eveline Keto, PA     February 01, 2020

## 2020-02-01 NOTE — Progress Notes (Signed)
[facility] INTERNAL MEDICINE   [address] [place] [address]  [phone]    ASSESSMENT AND PLAN   Diagnoses and all orders for this visit:    1. Type 2 diabetes mellitus treated with insulin (HCC)-is listed in [facility] medical record [facility]. We will review PCP records when they arrive and reconcile these with [facility] and our records. I was able to reach and speak to his niece Catalino Plascencia and she is agreeable to helping [place] get medical documentation from former facilities "[facility]" and his PCP. She said his brother Zackarey Holleman will also be useful in conveying PMHx for Bill. She is agreeable with a referral to [facility] DM RN for assistance with insulin management. Gershon Cull will fax me the Novolog SSI they are using currently. See patient instructions. After we get 5 days of blood sugar reads we will be able to better tell how elevated his blood sugars consistently are and make changes to his Levemir.   Assessment & Plan:  Patient states he is Type 1 diabetic since age of 79y/o. Medical records state he is Type 2. He is insulin dependent with long-acting bid and SSI Novolog. I will refer him to [facility] NP for further management.    Orders:  -     REFERRAL TO DIABETES [place] CTR    2. Dementia without behavioral disturbance, unspecified dementia type (HCC)-We are short on factual data from medical records today to fully outline his pmhx. Though Annette Stable is conversant, little can be gleaned from him on this subject. I will leave it to his PCP but Annette Stable will likely also benefit from a referral to [facility] for dementia med management and follow up.   Assessment & Plan:  Last evaluated by Alvie Heidelberg, APRN for "[facility]" for the fcaility [facility] in [facility]. He was started on donezepil 5mg  q hs x 4 weeks as a trial with a goal of monitoring MCA scores.     After the appointment, I received info faxed to me from [facility] of a psychiatric eval done at  [facility] in [place]. I used this to fill in the chart as much as I could. This packet was then placed into scanning. So far, no other info was provided to supplement this electronic records.        New Patient Total Time (120) minutes was spent on this date of the encounter for the following:    --preparing to see the patient  --obtaining and/or reviewing separately obtained history [facility] psychiatric assessment)  --performing a medically appropriate exam and/or evaluation  --counseling and educating the patient and caregiver [employer] from [place] in person and niece POA by phone  --Ordering tests-QID BS checks x 5 days  --documenting clinical information in the electronic health records  --care coordination At least [place] time spent talking with Priscella niece and attempting to acquire info from [facility] facility  CHIEF COMPLAINT   Alejandro [name] is a 79 y.o. male who presents to clinic for High Blood Sugar.    HPI   Damonte Chrissie Noa) is a patient of Biomedical scientist, FNP who is here from [facility] with a care worker [place]. Penny from the facility called yesterday to say [place] arrived from [facility] and has had some very high BS reads into the 500s. They have a SSI by his [facility] doctor but they need Bill to be seen sooner than his NP establish  visit May 4th, 2021.    Bill came to [name] 2 days ago with his brother Brian Knezevic. He is a [facility]. We have no records at the time of my visit with Bill. Priscilla states Bill has had high blood reads.  His BS on arrival at 5:30p.m. on Wednesday was 366. At 1:00 p.m. yesterday afternoon it was 526. They tried to call his provider in [place] but was forced to leave a message and they have not heard back.  His prior PCP was Dr. Novis at [place], [place] [phone]. This is when they called to get him an appt here.   His BS in office today  243. There are following Levemir and a SSI  NovoLog orders left by the former PCP.  They have a MAR left over from "[facility]" that states he is taking Levemir 20U SC bid and Novolog SSI but with no parameters written. Priscilla states she will get this for me when she returns to [name] with Bill.    She says a POA [name] Homen [phone]).  His brother is Brian Kreider [phone]. He is also a POA. [name] # is [phone]    Covid test negative on 01-28-20 done at [place].     Bill is states he has not fallen recently. He does not want to walk with an assistive device of any kind. He admits to deconditioning of his legs, however. He tells me he is a Type 1 diabetic which he's had since age 20y. He tells me he's never had a stroke.     Allergies   Allergen Reactions   ??? Shellfish Derived Anaphylaxis      Current Outpatient Medications on File Prior to Visit   Medication Sig Dispense Refill   ??? acetaminophen (TYLENOL) 325 mg tablet Take 325 mg by mouth every four (4) hours as needed for Pain.     ??? bisacodyL (Dulcolax, bisacodyl,) 10 mg supp Insert 10 mg into rectum. Prn for constipation if no results for milk of magnesia     ??? mineral oil (FLEET) enema Insert  into rectum now.     ??? ibuprofen (MOTRIN) 400 mg tablet Take 400 mg by mouth every six (6) hours as needed for Pain.     ??? magnesium citrate solution Take 296 mL by mouth now. Prn if no results after enema. If no results in 1 hour of completion of bowl protocol, call MD immediately for further orders.      ??? magnesium hydroxide (Phillips Milk of Magnesia) 400 mg/5 mL suspension Take 30 mL by mouth daily as needed for Constipation. Prn if no BM in 3 days     ??? benazepriL (LOTENSIN) 5 mg tablet Take 5 mg by mouth two (2) times a day.     ??? cetirizine (ZYRTEC) 10 mg tablet Take 10 mg by mouth daily.     ??? donepeziL (ARICEPT) 5 mg tablet Take  by mouth nightly.     ??? apixaban (Eliquis) 2.5 mg tablet Take 2.5 mg by mouth two (2) times a day.     ???  glucagon (GlucaGen HypoKit) 1 mg injection 1 mg by IntraVENous route once.     ??? insulin detemir U-100 (Levemir U-100 Insulin) 100 unit/mL injection 20 Units by SubCUTAneous route two (2) times a day.     ??? sodium phosphate,mono-dibasic (FLEET ENEMA RE) Insert  into rectum. Insert 1 enema rectally daily   prn for constipation if no results day after suppository     ??? QUEtiapine (SEROquel) 25 mg tablet Take 1 Tab by mouth two (2) times a day. For psychosis     ??? b complex-vitamin c-folic acid 0.8 mg (NEPHRO-VITE) 0.8 mg tab tablet Take 1 Tab by mouth daily.     ??? polyvinyl alcohol/povidone (ARTIFICIAL TEARS OP) Apply  to eye. Instill 2 gtts in ou every 6 hours prn for dry eyes     ??? insulin aspart U-100 (NOVOLOG) 100 unit/mL injection by SubCUTAneous route. For blood sugar ac and hs for glucose control, related to Type 2 DM.     0-150 = 0 units under 60 call MD;  151-200 = 2 units  201-250 = 4 units  251-300 = 6 units  301-350 = 8 units  351-400 = 10 units  Greater 400 call MD     ??? methocarbamoL (ROBAXIN) 500 mg tablet Take  by mouth three (3) times daily. Prn muscle spasms       No current facility-administered medications on file prior to visit.      There are no discontinued medications.    Past Medical History:   Diagnosis Date   ??? Dementia (HCC)    ??? Diabetes (HCC)    ??? Grief     loss of wife 2014   ??? Hypertension       History reviewed. No pertinent surgical history.    Social History     Tobacco Use   ??? Smoking status: Never Smoker   ??? Smokeless tobacco: Never Used   Substance Use Topics   ??? Alcohol use: Never     Frequency: Never     Binge frequency: Never   ??? Drug use: Never     History reviewed. No pertinent family history.      REVIEW OF SYSTEMS   Review of Systems   Constitutional: Negative.    HENT: Positive for hearing loss. Negative for congestion, ear discharge, ear pain, nosebleeds, sinus pain, sore throat and tinnitus.    Eyes: Negative.    Respiratory: Negative.  Negative for stridor.     Cardiovascular: Negative.    Gastrointestinal: Negative.    Genitourinary: Negative.    Musculoskeletal: Positive for falls (no recent falls according to the pt. Gershon Cull states she can be unsteady).   Skin: Negative.    Neurological: Negative.    Endo/Heme/Allergies: Negative.    Psychiatric/Behavioral: Positive for memory loss. Negative for depression, hallucinations, substance abuse and suicidal ideas. The patient is not nervous/anxious and does not have insomnia.       PHYSICAL EXAM     Visit Vitals  BP (!) 156/76 (BP 1 Location: Right arm, BP Patient Position: Sitting, BP Cuff Size: Adult)   Pulse 67   Ht 5\' 11"  (1.803 m)   Wt 156 lb 11.2 oz (71.1 kg)   BMI 21.86 kg/m??     Physical Exam  Constitutional:       General: He is not in acute distress.     Appearance: Normal appearance. He is normal weight. He is not ill-appearing, toxic-appearing or diaphoretic.   HENT:      Head: Normocephalic and atraumatic.      Right Ear: Tympanic membrane normal.      Left Ear: Tympanic membrane normal.      Nose: Nose normal. No congestion or rhinorrhea.      Mouth/Throat:      Mouth: Mucous membranes are moist.  Pharynx: Oropharynx is clear. No oropharyngeal exudate or posterior oropharyngeal erythema.   Eyes:      Extraocular Movements: Extraocular movements intact.      Conjunctiva/sclera: Conjunctivae normal.      Pupils: Pupils are equal, round, and reactive to light.   Neck:      Musculoskeletal: Normal range of motion and neck supple. No neck rigidity or muscular tenderness.   Cardiovascular:      Rate and Rhythm: Normal rate and regular rhythm.      Heart sounds: Normal heart sounds. No murmur. No friction rub. No gallop.    Pulmonary:      Effort: Pulmonary effort is normal. No respiratory distress.      Breath sounds: Normal breath sounds. No stridor. No wheezing or rales.   Musculoskeletal:         General: No swelling.      Right lower leg: No edema.      Left lower leg: No edema.      Comments: Patient  appeared a little unsteady while walking around the exam room   Lymphadenopathy:      Cervical: No cervical adenopathy.   Skin:     General: Skin is warm and dry.   Neurological:      General: No focal deficit present.      Mental Status: He is alert and oriented to person, place, and time.   Psychiatric:         Mood and Affect: Mood normal.         Behavior: Behavior normal.        LABS/IMAGING       Follow-up and Dispositions    ?? Return for as previous.       Future Appointments   Date Time Provider [place]   03/11/2020  9:00 AM Duayne Cal, [place], [place]  02/01/2020    This visit was dictated using M*Modal voice recognition software. Please excuse any errors and contact me if you have questions about specific passages that may be erroneous.

## 2020-02-01 NOTE — Telephone Encounter (Signed)
Fleet Contras from [employer] called to discuss pts blood sugar. Fleet Contras is going to fax information. Please advise [phone]

## 2020-02-01 NOTE — Telephone Encounter (Signed)
RE REFERRAL TO DIABETES TX CTR    Pt has no active insurance listed and self pay is not indicated. Please reach out to pt to obtain updated insurance information. Please advise when complete, thank you!

## 2020-02-01 NOTE — Assessment & Plan Note (Signed)
Patient states he is Type 1 diabetic since age of 79y/o. Medical records state he is Type 2. He is insulin dependent with long-acting bid and SSI Novolog. I will refer him to [facility] End[facility] NP for further management.

## 2020-02-01 NOTE — Assessment & Plan Note (Signed)
Last evaluated by Yvonka Williams, APRN for "[facility]" for the fcaility [facility] in [place]. He was tarted on donezepil 5mg q hs x 4 weeks as a trial with a goal of monitoring MCA scores.

## 2020-02-04 NOTE — Telephone Encounter (Signed)
Please show his PCP Ramon Dredge, FNP. I won't be at [facility] until Thursday. Thanks,   Signed By: Eveline Keto, PA     February 04, 2020

## 2020-02-04 NOTE — Telephone Encounter (Signed)
Faxed information received. Alejandro [name]

## 2020-02-05 NOTE — Telephone Encounter (Signed)
Renae Fickle aware    Clydie Braun-- anything further that needs to be done with this patient? Paient has f/u with PCP in May.     Candi Leash

## 2020-02-05 NOTE — Telephone Encounter (Signed)
Hopefully, he will get most of his information from my ov note but reading the additional records sent will only enhance his knowledge of this patient. It will take some time to gather information  also from the two POAs involved.  Thx,  Signed By: Eveline Keto, PA     February 05, 2020

## 2020-02-05 NOTE — Telephone Encounter (Signed)
Noted. Thanks Karen

## 2020-02-06 NOTE — Telephone Encounter (Signed)
Called next of kin [name], she said he has a Investment banker, corporate care through [employer]. The member ID is [id number]    Thanks Candi Leash

## 2020-02-06 NOTE — Telephone Encounter (Signed)
Following up on [place] note.     Pt has no active insurance listed and self pay is not indicated. Please reach out to pt to obtain updated insurance information. Please advise when complete, thank you!

## 2020-02-11 DIAGNOSIS — J449 Chronic obstructive pulmonary disease, unspecified: Secondary | ICD-10-CM | POA: Diagnosis not present

## 2020-02-11 DIAGNOSIS — Z79899 Other long term (current) drug therapy: Secondary | ICD-10-CM | POA: Diagnosis not present

## 2020-02-11 DIAGNOSIS — E7220 Disorder of urea cycle metabolism, unspecified: Secondary | ICD-10-CM | POA: Diagnosis not present

## 2020-02-13 MED ORDER — BASAGLAR KWIKPEN U-100 INSULIN 100 UNIT/ML (3 ML) SUBCUTANEOUS
100 unit/mL (3 mL) | PEN_INJECTOR | SUBCUTANEOUS | 1 refills | Status: DC
Start: 2020-02-13 — End: 2020-05-13

## 2020-02-13 NOTE — Telephone Encounter (Signed)
Shiva Sahagian  Aug 05, 1941  Patient is out of insulin, insurance will only cover lantus.  or basaglar; now using 20 units twice daily of Levemir  Satff called, if any questions please contact Lacey at [phone].      Call back needed: yes    Preferred call back number: Call preference: Home phone   [phone] (home)    No relevant phone numbers on file.        Medications Requested:  Requested Prescriptions      No prescriptions requested or ordered in this encounter             Preferred Pharmacy:   [facility] - [place], [place] - [address]  [address]  [place] [address]  Phone: [phone] Fax: [phone]                                       Last visit with provider:  Visit date not found    Future Appointments   Date Time Provider Department Center   03/11/2020  9:00 AM Cramm, Renae Fickle, FNP [facility] INT MED       MOST RECENT BLOOD PRESSURES  BP Readings from Last 3 Encounters:   02/01/20 (!) 156/76        MOST RECENT LAB DATA  No results found for: CREA, CREATEXT, K, POTEXT, ALT, TSH, TSHP, TSHEXT, CHOL, HGB, HGBEXT, HCT, HCTEXT, B12LT, B12EXT, URICACIDEXT, HBA1C, HGBE8, HBA1CPOC, HBA1CEXT, INR, TST5, TESFTT, TSHEXT, HGBEXT, HCTEXT, HBA1CEXT, INREXT

## 2020-02-13 NOTE — Telephone Encounter (Signed)
lacey notified at [place]. Script was sent to [facility]

## 2020-02-13 NOTE — Telephone Encounter (Signed)
OK to switch to [employer].  Total Levemir is 40 units.  Would need to reduce dose to 80% and take it only once per day.    So, ok to switch to the Basaglar Pen 32 units every day and update blood sugar 1 week.

## 2020-02-14 DIAGNOSIS — R000 Tachycardia, unspecified: Secondary | ICD-10-CM | POA: Diagnosis not present

## 2020-02-14 DIAGNOSIS — D7589 Other specified diseases of blood and blood-forming organs: Secondary | ICD-10-CM | POA: Diagnosis not present

## 2020-02-14 DIAGNOSIS — R2681 Unsteadiness on feet: Secondary | ICD-10-CM | POA: Diagnosis not present

## 2020-02-14 DIAGNOSIS — W19XXXA Unspecified fall, initial encounter: Secondary | ICD-10-CM | POA: Diagnosis not present

## 2020-02-14 DIAGNOSIS — D6489 Other specified anemias: Secondary | ICD-10-CM | POA: Diagnosis not present

## 2020-02-14 DIAGNOSIS — M25562 Pain in left knee: Secondary | ICD-10-CM | POA: Diagnosis not present

## 2020-02-14 DIAGNOSIS — M25561 Pain in right knee: Secondary | ICD-10-CM | POA: Diagnosis not present

## 2020-02-14 DIAGNOSIS — M25532 Pain in left wrist: Secondary | ICD-10-CM | POA: Diagnosis not present

## 2020-02-14 DIAGNOSIS — R58 Hemorrhage, not elsewhere classified: Secondary | ICD-10-CM | POA: Diagnosis not present

## 2020-02-14 DIAGNOSIS — R52 Pain, unspecified: Secondary | ICD-10-CM | POA: Diagnosis not present

## 2020-02-14 DIAGNOSIS — M25531 Pain in right wrist: Secondary | ICD-10-CM | POA: Diagnosis not present

## 2020-02-14 DIAGNOSIS — R0781 Pleurodynia: Secondary | ICD-10-CM | POA: Diagnosis not present

## 2020-02-14 NOTE — Telephone Encounter (Signed)
This is complete.    Alejandro [name]

## 2020-02-14 NOTE — Telephone Encounter (Signed)
Tammy from [facility] called stating that she is unable to give him his insulin. Pt blood sugar was 423. Her sliding scale is preventing her from giving him any insulin over 400 without being able to document it. Need directions on how much to give him. Fax directions to [phone]    Questions call [phone]

## 2020-02-14 NOTE — Telephone Encounter (Signed)
Gave verbal order for change      Candi Leash

## 2020-02-14 NOTE — Telephone Encounter (Signed)
[facility] called regarding the script that was sent in for insulin glargine yesterday. The pts insurance prefers the [facility] and the pharmacy would need a verbal okay to switch this.  Pt is currently out of medication.  Please advise  [phone]

## 2020-02-14 NOTE — Telephone Encounter (Signed)
Called pharmacy to have my call returned.    Alejandro [name]

## 2020-02-14 NOTE — Telephone Encounter (Signed)
Noted.

## 2020-02-25 DIAGNOSIS — Z20828 Contact with and (suspected) exposure to other viral communicable diseases: Secondary | ICD-10-CM | POA: Diagnosis not present

## 2020-02-28 DIAGNOSIS — R509 Fever, unspecified: Secondary | ICD-10-CM | POA: Diagnosis not present

## 2020-02-28 DIAGNOSIS — S62002S Unspecified fracture of navicular [scaphoid] bone of left wrist, sequela: Secondary | ICD-10-CM | POA: Diagnosis not present

## 2020-02-28 DIAGNOSIS — R000 Tachycardia, unspecified: Secondary | ICD-10-CM | POA: Diagnosis not present

## 2020-02-28 DIAGNOSIS — M19032 Primary osteoarthritis, left wrist: Secondary | ICD-10-CM | POA: Diagnosis not present

## 2020-02-28 DIAGNOSIS — R0781 Pleurodynia: Secondary | ICD-10-CM | POA: Diagnosis not present

## 2020-02-28 DIAGNOSIS — R58 Hemorrhage, not elsewhere classified: Secondary | ICD-10-CM | POA: Diagnosis not present

## 2020-02-28 DIAGNOSIS — M1711 Unilateral primary osteoarthritis, right knee: Secondary | ICD-10-CM | POA: Diagnosis not present

## 2020-02-29 DIAGNOSIS — Z79899 Other long term (current) drug therapy: Secondary | ICD-10-CM | POA: Diagnosis not present

## 2020-02-29 DIAGNOSIS — N390 Urinary tract infection, site not specified: Secondary | ICD-10-CM | POA: Diagnosis not present

## 2020-03-03 DIAGNOSIS — F2089 Other schizophrenia: Secondary | ICD-10-CM | POA: Diagnosis not present

## 2020-03-04 DIAGNOSIS — G309 Alzheimer's disease, unspecified: Secondary | ICD-10-CM | POA: Diagnosis not present

## 2020-03-04 DIAGNOSIS — R32 Unspecified urinary incontinence: Secondary | ICD-10-CM | POA: Diagnosis not present

## 2020-03-11 ENCOUNTER — Ambulatory Visit: Attending: Registered Nurse | Primary: Registered Nurse

## 2020-03-11 ENCOUNTER — Ambulatory Visit: Admit: 2020-03-11 | Discharge: 2020-03-11 | Payer: MEDICARE | Attending: Registered Nurse | Primary: Registered Nurse

## 2020-03-11 DIAGNOSIS — I10 Essential (primary) hypertension: Secondary | ICD-10-CM

## 2020-03-11 NOTE — Progress Notes (Signed)
[facility] INTERNAL MEDICINE   [address]  [place] [address]  [phone]  Akul Leggette  May 13, 1941      Impression/Plan: Will see patient in 6 months for followup, dementia.    Chronic Conditions Addressed Today     1. Late onset Alzheimer's dementia with behavioral disturbance [facility])      Ordered Referral for Geriatric Psychiatry with Dr. Kris Hartmann.  Continue same medications.    Ordered home health services through [facility];     Skilled Nursing for cardiopulmonary, skin assessments.  Physical therapy for improving endurance, strengthening, and gait improvement.  Occupational therapy for improvement in activities of daily living, improvement in safely performing activities of daily living, equipment recommendations.  Medical social worker for family support, assistance with  accessing community resources.           2. Type 2 diabetes mellitus treated with insulin (HCC)      Continue with current medications.         3. Hypertension - Primary      As patient was agitated during today's visit, blood pressures taken likely do not reflect his baseline.  Will have will have [facility] home health staff monitor blood pressures and report to our office.             Other Problems Addressed Today     Routine general medical examination at a health care facility        Dementia with behavioral disturbance, unspecified dementia type [facility])               Chief Compliant: [name] presents today for new patient visit.  Chief Complaint   Patient presents with   ??? Establish Care         HPI: 79 year old gentleman who is here for a new patient visit. Patient is challenged with dementia, is a resident of [facility] retirement home, is here with his niece [name] (his POA) and [employer] representative member [place]. Patient is adjusting to moving from [place] to here in [place] to be closer to his brothers family who are able to manage his care as patient is challenged with advancing dementia. He has  a room on the dementia unit at [facility].   He is able to shower himself with staff providing meals, administering and managing his medications, and providing supportive care.    Patient became angry during visit, however relaxed at end of visit. Patient's niece [name] and [employer] Glazier staff member Gershon Cull along with myself spoke quietly and calmly to patient to help reduce his anxiety and frustration.    Patient and his niece [name] are receptive to having [facility] services, as well as, a Geriatric Psychiatry referral with Dr. Kris Hartmann.        Current Outpatient Medications   Medication Instructions   ??? acetaminophen (TYLENOL) 325 mg, Oral, EVERY 4 HOURS AS NEEDED   ??? apixaban (ELIQUIS) 2.5 mg, Oral, 2 TIMES DAILY   ??? b complex-vitamin c-folic acid 0.8 mg (NEPHRO-VITE) 0.8 mg tab tablet 1 Tab, Oral, DAILY   ??? benazepriL (LOTENSIN) 5 mg, Oral, 2 TIMES DAILY   ??? bisacodyL (DULCOLAX (BISACODYL)) 10 mg, Rectal, Prn for constipation if no results for milk of magnesia   ??? cetirizine (ZYRTEC) 10 mg, Oral, DAILY   ??? donepeziL (ARICEPT) 5 mg tablet Oral, EVERY BEDTIME   ??? glucagon (GLUCAGEN HYPOKIT) 1 mg, IntraVENous, ONCE   ??? ibuprofen (MOTRIN) 400 mg, Oral, EVERY 6 HOURS  AS NEEDED   ??? insulin aspart U-100 (NOVOLOG) 100 unit/mL injection SubCUTAneous, For blood sugar ac and hs for glucose control, related to Type 2 DM. 0-150 = 0 units under 60 call MD;151-200 = 2 units201-250 = 4 units251-300 = 6 units301-350 = 8 units351-400 = 10 unitsGreater 400 call MD   ??? insulin glargine (Basaglar KwikPen U-100 Insulin) 100 unit/mL (3 mL) inpn 32 units sc QAM   ??? magnesium citrate solution 296 mL, Oral, NOW, Prn if no results after enema. If no results in 1 hour of completion of bowl protocol, call MD immediately for further orders.    ??? magnesium hydroxide (Phillips Milk of Magnesia) 400 mg/5 mL suspension 30 mL, Oral, DAILY PRN, Prn if no BM in 3 days   ??? methocarbamoL (ROBAXIN) 500 mg tablet Oral, 3  TIMES DAILY, Prn muscle spasms   ??? mineral oil (FLEET) enema Rectal, NOW   ??? polyvinyl alcohol/povidone (ARTIFICIAL TEARS OP) Ophthalmic, Instill 2 gtts in ou every 6 hours prn for dry eyes   ??? QUEtiapine (SEROquel) 25 mg tablet 1 Tab, Oral, 2 TIMES DAILY, For psychosis   ??? sodium phosphate,mono-dibasic (FLEET ENEMA RE) Rectal, Insert 1 enema rectally daily prn for constipation if no results day after suppository        Allergies   Allergen Reactions   ??? Shellfish Derived Anaphylaxis        Review of Systems   Constitutional: Negative.    HENT: Negative.    Eyes: Negative.    Respiratory: Negative.    Cardiovascular: Negative.    Gastrointestinal: Negative.    Genitourinary: Negative.    Musculoskeletal: Negative.    Skin: Negative.    Neurological: Negative.    Endo/Heme/Allergies: Negative.    Psychiatric/Behavioral:        Alert and oriented to self, not place, not time, not current President        Vitals:    03/11/20 0918 03/11/20 0950   BP: (!) 166/88 (!) 145/80   Pulse: 80    Weight: 161 lb 12.8 oz (73.4 kg)    Height: 5\' 11"  (1.803 m)        Physical Exam  Constitutional:       General: He is not in acute distress.     Appearance: Normal appearance. He is normal weight. He is not ill-appearing, toxic-appearing or diaphoretic.   HENT:      Head: Normocephalic and atraumatic.      Nose: Nose normal.   Eyes:      General: No scleral icterus.        Right eye: No discharge.         Left eye: No discharge.      Conjunctiva/sclera: Conjunctivae normal.   Neck:      Musculoskeletal: Normal range of motion and neck supple.   Cardiovascular:      Rate and Rhythm: Normal rate.      Pulses: Normal pulses.      Heart sounds: Normal heart sounds. No murmur. No friction rub. No gallop.    Pulmonary:      Effort: Pulmonary effort is normal. No respiratory distress.      Breath sounds: Normal breath sounds. No stridor. No wheezing, rhonchi or rales.   Chest:      Chest wall: No tenderness.   Abdominal:      General: Bowel  sounds are normal. There is no distension.      Palpations: Abdomen is soft.  Tenderness: There is no abdominal tenderness. There is no guarding or rebound.   Musculoskeletal: Normal range of motion.         General: No swelling, tenderness, deformity or signs of injury.      Right lower leg: No edema.      Left lower leg: No edema.   Skin:     General: Skin is warm and dry.      Capillary Refill: Capillary refill takes less than 2 seconds.      Coloration: Skin is not jaundiced or pale.      Findings: No bruising, erythema, lesion or rash.      Nails: There is no clubbing.     Neurological:      General: No focal deficit present.      Mental Status: He is alert and oriented to person, place, and time.      Sensory: No sensory deficit.      Motor: No weakness.      Coordination: Coordination normal.      Gait: Gait normal.   Psychiatric:      Comments: Labile mood  Advancing dementia        I reviewed and the patient's Past Medical, Surgical, Family and Social History in addition to their Problems, Meds and Allergies.  Recent results reviewed  Any problems listed in the Assessment and Plan were assessed during today's visit and if not explicitly discussed are stable based on history, physical exam, review of pertinent labs, studies and medications.         Follow-up and Dispositions    ?? Return for f/u in 6 mos dementia..       Future Appointments   Date Time Provider Department Center   09/11/2020 10:00 AM Ramon Dredge, FNP [facility] INT MED       Patient was seen by Ramon Dredge, FNP and  Ramon Dredge, FNP  03/11/2020      [facility] INTERNAL MEDICINE   [address] [place] [address]  [phone]

## 2020-03-11 NOTE — Progress Notes (Signed)
[facility]   [address]  [place] [address]  [phone]  Alejandro [name]  02/15/1941      Impression/Plan: Will see patient in 6 months for followup, dementia.    Chronic Conditions Addressed Today     1. 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 Clifford Singer.  Continue same medications.    Ordered home health services through [facility];     Skilled Nursing for cardiopulmonary, skin assessments.  Physical therapy for improving endurance, strengthening, and gait improvement.  Occupational therapy for improvement in activities of daily living, improvement in safely performing activities of daily living, equipment recommendations.  Medical social worker for family support, assistance with  accessing community resources.           2. Type 2 diabetes mellitus treated with insulin (HCC)      Continue with current medications.         3. Hypertension - Primary      As patient was agitated during today's visit, blood pressures taken likely do not reflect his baseline.  Will have will have [facility] staff monitor blood pressures and report to our office.             Other Problems Addressed Today     Routine general medical examination at a health care facility        Dementia with behavioral disturbance, unspecified dementia type (HCC)               Chief Compliant: Alejandro [name] presents today for new patient visit.  Chief Complaint   Patient presents with   ??? Establish Care         HPI: 79 year old gentleman who is here for a new patient visit. Patient is challenged with dementia, is a resident of [name] retirement home, is here with his niece [name] (his POA) and [name] staff member Priscilla. Patient is adjusting to moving from [place] to here in [place] to be closer to his brothers family who are able to manage his care as patient is challenged with advancing dementia. He has  a room on the dementia unit at [name].   He is able to shower himself with staff providing meals, administering and managing his medications, and providing supportive care.    Patient became angry during visit, however relaxed at end of visit. Patient's niece [name] and [name] staff member Priscilla along with myself spoke quietly and calmly to patient to help reduce his anxiety and frustration.    Patient and his niece [name] are receptive to having [facility] services, as well as, a Geriatric Psychiatry referral with Dr. Clifford Singer.        Current Outpatient Medications   Medication Instructions   ??? acetaminophen (TYLENOL) 325 mg, Oral, EVERY 4 HOURS AS NEEDED   ??? apixaban (ELIQUIS) 2.5 mg, Oral, 2 TIMES DAILY   ??? b complex-vitamin c-folic acid 0.8 mg (NEPHRO-VITE) 0.8 mg tab tablet 1 Tab, Oral, DAILY   ??? benazepriL (LOTENSIN) 5 mg, Oral, 2 TIMES DAILY   ??? bisacodyL (DULCOLAX (BISACODYL)) 10 mg, Rectal, Prn for constipation if no results for milk of magnesia   ??? cetirizine (ZYRTEC) 10 mg, Oral, DAILY   ??? donepeziL (ARICEPT) 5 mg tablet Oral, EVERY BEDTIME   ??? glucagon (GLUCAGEN HYPOKIT) 1 mg, IntraVENous, ONCE   ??? ibuprofen (MOTRIN) 400 mg, Oral, EVERY 6 HOURS   AS NEEDED   ??? insulin aspart U-100 (NOVOLOG) 100 unit/mL injection SubCUTAneous, For blood sugar ac and hs for glucose control, related to Type 2 DM. 0-150 = 0 units under 60 call MD;151-200 = 2 units201-250 = 4 units251-300 = 6 units301-350 = 8 units351-400 = 10 unitsGreater 400 call MD   ??? insulin glargine (Basaglar KwikPen U-100 Insulin) 100 unit/mL (3 mL) inpn 32 units sc QAM   ??? magnesium citrate solution 296 mL, Oral, NOW, Prn if no results after enema. If no results in 1 hour of completion of bowl protocol, call MD immediately for further orders.    ??? magnesium hydroxide (Phillips Milk of Magnesia) 400 mg/5 mL suspension 30 mL, Oral, DAILY PRN, Prn if no BM in 3 days   ??? methocarbamoL (ROBAXIN) 500 mg tablet Oral, 3  TIMES DAILY, Prn muscle spasms   ??? mineral oil (FLEET) enema Rectal, NOW   ??? polyvinyl alcohol/povidone (ARTIFICIAL TEARS OP) Ophthalmic, Instill 2 gtts in ou every 6 hours prn for dry eyes   ??? QUEtiapine (SEROquel) 25 mg tablet 1 Tab, Oral, 2 TIMES DAILY, For psychosis   ??? sodium phosphate,mono-dibasic (FLEET ENEMA RE) Rectal, Insert 1 enema rectally daily prn for constipation if no results day after suppository        Allergies   Allergen Reactions   ??? Shellfish Derived Anaphylaxis        Review of Systems   Constitutional: Negative.    HENT: Negative.    Eyes: Negative.    Respiratory: Negative.    Cardiovascular: Negative.    Gastrointestinal: Negative.    Genitourinary: Negative.    Musculoskeletal: Negative.    Skin: Negative.    Neurological: Negative.    Endo/Heme/Allergies: Negative.    Psychiatric/Behavioral:        Alert and oriented to self, not place, not time, not current President        Vitals:    03/11/20 0918 03/11/20 0950   BP: (!) 166/88 (!) 145/80   Pulse: 80    Weight: 161 lb 12.8 oz (73.4 kg)    Height: 5\' 11"  (1.803 m)        Physical Exam  Constitutional:       General: He is not in acute distress.     Appearance: Normal appearance. He is normal weight. He is not ill-appearing, toxic-appearing or diaphoretic.   HENT:      Head: Normocephalic and atraumatic.      Nose: Nose normal.   Eyes:      General: No scleral icterus.        Right eye: No discharge.         Left eye: No discharge.      Conjunctiva/sclera: Conjunctivae normal.   Neck:      Musculoskeletal: Normal range of motion and neck supple.   Cardiovascular:      Rate and Rhythm: Normal rate.      Pulses: Normal pulses.      Heart sounds: Normal heart sounds. No murmur. No friction rub. No gallop.    Pulmonary:      Effort: Pulmonary effort is normal. No respiratory distress.      Breath sounds: Normal breath sounds. No stridor. No wheezing, rhonchi or rales.   Chest:      Chest wall: No tenderness.   Abdominal:      General: Bowel  sounds are normal. There is no distension.      Palpations: Abdomen is soft.  Tenderness: There is no abdominal tenderness. There is no guarding or rebound.   Musculoskeletal: Normal range of motion.         General: No swelling, tenderness, deformity or signs of injury.      Right lower leg: No edema.      Left lower leg: No edema.   Skin:     General: Skin is warm and dry.      Capillary Refill: Capillary refill takes less than 2 seconds.      Coloration: Skin is not jaundiced or pale.      Findings: No bruising, erythema, lesion or rash.      Nails: There is no clubbing.     Neurological:      General: No focal deficit present.      Mental Status: He is alert and oriented to person, place, and time.      Sensory: No sensory deficit.      Motor: No weakness.      Coordination: Coordination normal.      Gait: Gait normal.   Psychiatric:      Comments: Labile mood  Advancing dementia        I reviewed and the patient's Past Medical, Surgical, Family and Social History in addition to their Problems, Meds and Allergies.  Recent results reviewed  Any problems listed in the Assessment and Plan were assessed during today's visit and if not explicitly discussed are stable based on history, physical exam, review of pertinent labs, studies and medications.         Follow-up and Dispositions    ?? Return for f/u in 6 mos dementia..       Future Appointments   Date Time Provider Department Center   09/11/2020 10:00 AM Ramon Dredge, FNP [facility]       Patient was seen by Ramon Dredge, FNP and  Ramon Dredge, FNP  03/11/2020      [facility] INTERNAL MEDICINE   [address] [place] [address]  [phone]

## 2020-03-11 NOTE — Patient Instructions (Signed)
Will see you in 6 months for followup, dementia.  Have a nice summer.

## 2020-03-14 NOTE — Assessment & Plan Note (Signed)
As patient was agitated during today's visit, blood pressures taken likely do not reflect his baseline.  Will have will have [facility] staff monitor blood pressures and report to our office.

## 2020-03-14 NOTE — Assessment & Plan Note (Signed)
Ordered Referral for Geriatric Psychiatry with Dr. Kris Hartmann.  Continue same medications.    Ordered home health services through [facility];     Skilled Nursing for cardiopulmonary, skin assessments.  Physical therapy for improving endurance, strengthening, and gait improvement.  Occupational therapy for improvement in activities of daily living, improvement in safely performing activities of daily living, equipment recommendations.  Medical social worker for family support, assistance with  accessing community resources.

## 2020-03-14 NOTE — Assessment & Plan Note (Signed)
Continue with current medications.

## 2020-03-14 NOTE — Assessment & Plan Note (Addendum)
Ordered Referral for Geriatric Psychiatry with Dr. Clifford Singer.  Continue same medications.    Ordered home health services through [facility];     Skilled Nursing for cardiopulmonary, skin assessments.  Physical therapy for improving endurance, strengthening, and gait improvement.  Occupational therapy for improvement in activities of daily living, improvement in safely performing activities of daily living, equipment recommendations.  Medical social worker for family support, assistance with  accessing community resources.

## 2020-03-14 NOTE — Telephone Encounter (Signed)
Yes that is ok.

## 2020-03-14 NOTE — Telephone Encounter (Signed)
Andrey Campanile called regarding the pt's referral, she states that they are full and the next opening isn't until Tuesday. She is wondering if it is okay to delay the pt's opening until then.    Please advise  :[phone]

## 2020-03-14 NOTE — Assessment & Plan Note (Addendum)
As patient was agitated during today's visit, blood pressures taken likely do not reflect his baseline.  Will have will have [facility] staff monitor blood pressures and report to our office.

## 2020-03-14 NOTE — Telephone Encounter (Signed)
Communicated with Andrey Campanile about this.     Candi Leash

## 2020-03-17 NOTE — Telephone Encounter (Signed)
Accepted the referral but pt's insurance doesn't take home health referral so pt would be responsible for 100 percent of the cost    Pt is suppose to be opened to home health tomorrow    Tresa Endo didn't know if we wanted to reach out to the pt to let him know this before he was seen to let him know this and let him make the decision.     Please advise  [phone]

## 2020-03-17 NOTE — Telephone Encounter (Signed)
Called POA to get updated insurance information.     Called Tresa Endo to inform-- Home Health will see patient tomorrow and Wed.    Candi Leash

## 2020-03-17 NOTE — Telephone Encounter (Signed)
Alejandro [name] will be contacting the office with new insurance information  We need current Medicare #     Dhruva Orndoff Berenice Primas

## 2020-03-18 ENCOUNTER — Telehealth

## 2020-03-18 MED ORDER — AMLODIPINE 10 MG TAB
10 mg | ORAL_TABLET | Freq: Every day | ORAL | 3 refills | Status: DC
Start: 2020-03-18 — End: 2021-04-05

## 2020-03-18 NOTE — Telephone Encounter (Signed)
Amlodipine 10 mg by mouth daily has been ordered.

## 2020-03-18 NOTE — Telephone Encounter (Signed)
Orders created. Need to be signed by PCP and faxed.  Signed By: Candi Leash     Mar 18, 2020

## 2020-03-18 NOTE — Telephone Encounter (Signed)
Amlodipine 10 mg by mouth daily ordered for recent elevated blood pressures.

## 2020-03-18 NOTE — Telephone Encounter (Signed)
Misty Stanley went out to see patient, she talked to PA and she didn't want services because insurance will not cover. Misty Stanley wanted to let PCP know what was found.    BP 160/70  HR 70     They will not check BP because it has not been ordered. Could Renae Fickle call over to ask for BP check BID and set parameters.     They have Amlodapine there but do not have orders to give it.     Patient does have a pacemaker, and do not know when last time battery has been changed.

## 2020-03-19 NOTE — Telephone Encounter (Signed)
Orders faxed to [facility]

## 2020-03-19 NOTE — Telephone Encounter (Signed)
Called Alejandro [name] and discussed.    Will further review meds.

## 2020-03-19 NOTE — Telephone Encounter (Signed)
Pt was open to home care services today for physical therapy. Pt will be seen by physical therapy twice a week for four weeks. Lauris Poag will be discontinuing orders for occupational therapy because pt seems to be doing okay with just physical therapy. Lauris Poag would like to get a call from an ma to go over pt's medication, they received a med list from [facility] and it is not matching up with the med list they received from [place]. Please advise.      Lauris Poag  [phone]

## 2020-03-21 ENCOUNTER — Telehealth

## 2020-03-21 NOTE — Telephone Encounter (Signed)
This encounter was created in error - please disregard.

## 2020-03-28 NOTE — Progress Notes (Signed)
CMS form, care plan and patient's chart was reviewed.    Problems, medications and treatments remain accurate.    Care plan approved.  Recertify every 60 days as needed.   Dates of Service: 03/19/2020 - 05/17/2020

## 2020-03-30 ENCOUNTER — Encounter: Payer: Self-pay | Admitting: Emergency Medicine

## 2020-03-30 ENCOUNTER — Emergency Department
Admission: EM | Admit: 2020-03-30 | Discharge: 2020-03-30 | Disposition: A | Discharge status: Other Acute Inpatient Hospital | Payer: Medicare HMO | Attending: Emergency Medicine | Admitting: Emergency Medicine

## 2020-03-30 ENCOUNTER — Other Ambulatory Visit: Payer: Self-pay

## 2020-03-30 ENCOUNTER — Emergency Department: Payer: Medicare HMO

## 2020-03-30 DIAGNOSIS — Y9389 Activity, other specified: Secondary | ICD-10-CM | POA: Diagnosis not present

## 2020-03-30 DIAGNOSIS — Y999 Unspecified external cause status: Secondary | ICD-10-CM | POA: Insufficient documentation

## 2020-03-30 DIAGNOSIS — Y92129 Unspecified place in nursing home as the place of occurrence of the external cause: Secondary | ICD-10-CM | POA: Diagnosis not present

## 2020-03-30 DIAGNOSIS — S0101XA Laceration without foreign body of scalp, initial encounter: Secondary | ICD-10-CM

## 2020-03-30 DIAGNOSIS — Z79899 Other long term (current) drug therapy: Secondary | ICD-10-CM | POA: Diagnosis not present

## 2020-03-30 DIAGNOSIS — G309 Alzheimer's disease, unspecified: Secondary | ICD-10-CM | POA: Diagnosis not present

## 2020-03-30 DIAGNOSIS — S0990XA Unspecified injury of head, initial encounter: Secondary | ICD-10-CM

## 2020-03-30 DIAGNOSIS — G20 Parkinson's disease: Secondary | ICD-10-CM | POA: Diagnosis not present

## 2020-03-30 DIAGNOSIS — W01198A Fall on same level from slipping, tripping and stumbling with subsequent striking against other object, initial encounter: Secondary | ICD-10-CM | POA: Insufficient documentation

## 2020-03-30 DIAGNOSIS — W19XXXA Unspecified fall, initial encounter: Secondary | ICD-10-CM

## 2020-03-30 MED ORDER — LIDOCAINE-EPINEPHRINE-TETRACAINE (LET) TOPICAL GEL
3.0000 mL | Freq: Once | TOPICAL | Status: DC
  Filled 2020-03-30: qty 3

## 2020-03-30 NOTE — ED Notes (Signed)
Signature pad not working, Pt denies questions/concerns regarding discharge. Report given to caregiver at [facility] by [employer].

## 2020-03-30 NOTE — ED Notes (Signed)
Cleaned patient up.

## 2020-03-30 NOTE — ED Notes (Signed)
This RN spoke with [facility]. Caregiver states she is unable to [employer] and has no one to get pt. Would like pt sent back by EMS.

## 2020-03-30 NOTE — ED Provider Notes (Signed)
[facility] Emergency Department Provider Note  ____________________________________________  Time seen: Approximately 3:45 PM  I have reviewed the triage vital signs and the nursing notes.   HISTORY  Chief Complaint Laceration and Fall    HPI Aaron Cain is a 79 y.o. male who presents the emergency department via EMS with his daughter for complaint of fall with head injury.  Patient states that he was trying to enter  the [facility].  Patient states that he was walking down the hall, attempted to turn and he believed that he hit the brake on his walker causing him to fall.  He states when he hit the right, one of his hand slipped off the walker, causing him to fall towards that side eventually being tangled in his walker falling and hitting his head on the door frame.  Patient denies loss of consciousness but endorses sustaining a laceration to the top of his head.  Patient denies any headache, vision changes at this time.  No neck pain.  Patient denies any other musculoskeletal complaint.  No medications prior to arrival.  Patient with a history of Alzheimer's disease, however patient is able to provide all of his own history accurately.  Patient is answering questions appropriately.  He is AOx4.  Patient does have family member with him, however again he is able to make decisions for himself and answers all questions appropriately.  Patient has a history of Alzheimer disease, Parkinson's, COPD, osteoporosis, schizophrenia.        Past Medical History:  Diagnosis Date  . Alzheimer disease ([place])   . COPD (chronic obstructive pulmonary disease) ([place])   . Drug abuse ([place])   . GERD (gastroesophageal reflux disease)   . Osteoporosis   . Parkinson disease ([place])   . Schizophrenia ([place])     There are no problems to display for this patient.   History reviewed. No pertinent surgical history.  Prior to Admission medications    Medication Sig Start Date End Date Taking? Authorizing Provider  alendronate (FOSAMAX) 70 MG tablet Take 70 mg by mouth once a week. Take with a full glass of water on an empty stomach.    [provider]  divalproex (DEPAKOTE) 500 MG DR tablet Take 500 mg by mouth 3 (three) times daily.    [provider]  mirtazapine (REMERON) 30 MG tablet Take 30 mg by mouth at bedtime.    [provider]  omeprazole (PRILOSEC) 20 MG capsule Take 20 mg by mouth daily.    [provider]    Allergies Penicillins and Sulfa antibiotics  History reviewed. No pertinent family history.  Social History Social History   Tobacco Use  . Smoking status: Former Research scientist (life sciences)  . Smokeless tobacco: Never Used  Substance Use Topics  . Alcohol use: Not Currently  . Drug use: Not Currently     Review of Systems  Constitutional: No fever/chills.  Positive for fall with laceration to the head. Eyes: No visual changes. No discharge ENT: No upper respiratory complaints. Cardiovascular: no chest pain. Respiratory: no cough. No SOB. Gastrointestinal: No abdominal pain.  No nausea, no vomiting.  No diarrhea.  No constipation. Musculoskeletal: Negative for musculoskeletal pain. Skin: Negative for rash, abrasions, lacerations, ecchymosis. Neurological: Negative for headaches, focal weakness or numbness. 10-point ROS otherwise negative.  ____________________________________________   PHYSICAL EXAM:  VITAL SIGNS: ED Triage Vitals  Enc Vitals Group     BP 03/30/20 1531 119/68  Pulse Rate 03/30/20 1531 98     Resp 03/30/20 1531 18     Temp 03/30/20 1531 98.8 F (37.1 C)     Temp Source 03/30/20 1531 Oral     SpO2 03/30/20 1531 92 %     Weight 03/30/20 1535 146 lb (66.2 kg)     Height 03/30/20 1535 5\' 10"  (1.778 m)     Head Circumference --      Peak Flow --      Pain Score 03/30/20 1535 2     Pain Loc --      Pain Edu? --      Excl. in GC? --      Constitutional:  Alert and oriented. Well appearing and in no acute distress. Eyes: Conjunctivae are normal. PERRL. EOMI. Head: Patient has an approximately 5 cm laceration along the superior aspect of the occipital skull.  No active bleeding but significant dried blood noted in the hair and along the scalp.  Laceration is relatively well approximated.  Does not appear to extend deep into the subcutaneous tissue.  No visible foreign body.  Patient does not report any tenderness to palpation of the osseous structures of the skull or face.  No battle signs, raccoon eyes, serosanguineous fluid drainage from the ears or nares. ENT:      Ears:       Nose: No congestion/rhinnorhea.      Mouth/Throat: Mucous membranes are moist.  Neck: No stridor.  No cervical spine tenderness to palpation.  Cardiovascular: Normal rate, regular rhythm. Normal S1 and S2.  Good peripheral circulation. Respiratory: Normal respiratory effort without tachypnea or retractions. Lungs CTAB. Good air entry to the bases with no decreased or absent breath sounds. Musculoskeletal: Full range of motion to all extremities. No gross deformities appreciated. Neurologic:  Normal speech and language. No gross focal neurologic deficits are appreciated.  Skin:  Skin is warm, dry and intact. No rash noted. Psychiatric: Mood and affect are normal. Speech and behavior are normal. Patient exhibits appropriate insight and judgement.   ____________________________________________   LABS (all labs ordered are listed, but only abnormal results are displayed)  Labs Reviewed - No data to display ____________________________________________  EKG   ____________________________________________  RADIOLOGY I personally viewed and evaluated these images as part of my medical decision making, as well as reviewing the written report by the radiologist.  CT Head Wo Contrast  Result Date: 03/30/2020 CLINICAL DATA:  Poorly trauma. EXAM: CT HEAD WITHOUT CONTRAST  CT CERVICAL SPINE WITHOUT CONTRAST TECHNIQUE: Multidetector CT imaging of the head and cervical spine was performed following the standard protocol without intravenous contrast. Multiplanar CT image reconstructions of the cervical spine were also generated. COMPARISON:  March 04, 2018 FINDINGS: CT HEAD FINDINGS Brain: No evidence of acute infarction, hemorrhage, hydrocephalus, extra-axial collection or mass lesion/mass effect. Marked brain parenchymal volume loss and deep white matter microangiopathy with asymmetric involvement of the left parietal lobe. Vascular: Calcific atherosclerotic disease. Skull: Normal. Negative for fracture or focal lesion. Sinuses/Orbits: No acute finding. Other: None. CT CERVICAL SPINE FINDINGS Alignment: Motion degraded exam. Straightening of the cervical lordosis. Skull base and vertebrae: No acute fracture. No primary bone lesion or focal pathologic process. Soft tissues and spinal canal: No prevertebral fluid or swelling. No visible canal hematoma. Disc levels:  Multilevel osteoarthritic changes. Upper chest: Negative. Other: Chronic compression deformity of T3 vertebral body. IMPRESSION: 1. No acute intracranial abnormality. 2. Marked brain parenchymal atrophy and chronic microvascular disease with asymmetric involvement of the  left parietal lobe. 3. No evidence of acute traumatic injury to cervical spine. 4. Multilevel osteoarthritic changes of the cervical spine. 5. Chronic compression deformity of T3 vertebral body. Electronically Signed   By: Ted Mcalpine M.D.   On: 03/30/2020 17:02   CT Cervical Spine Wo Contrast  Result Date: 03/30/2020 CLINICAL DATA:  Poorly trauma. EXAM: CT HEAD WITHOUT CONTRAST CT CERVICAL SPINE WITHOUT CONTRAST TECHNIQUE: Multidetector CT imaging of the head and cervical spine was performed following the standard protocol without intravenous contrast. Multiplanar CT image reconstructions of the cervical spine were also generated. COMPARISON:   March 04, 2018 FINDINGS: CT HEAD FINDINGS Brain: No evidence of acute infarction, hemorrhage, hydrocephalus, extra-axial collection or mass lesion/mass effect. Marked brain parenchymal volume loss and deep white matter microangiopathy with asymmetric involvement of the left parietal lobe. Vascular: Calcific atherosclerotic disease. Skull: Normal. Negative for fracture or focal lesion. Sinuses/Orbits: No acute finding. Other: None. CT CERVICAL SPINE FINDINGS Alignment: Motion degraded exam. Straightening of the cervical lordosis. Skull base and vertebrae: No acute fracture. No primary bone lesion or focal pathologic process. Soft tissues and spinal canal: No prevertebral fluid or swelling. No visible canal hematoma. Disc levels:  Multilevel osteoarthritic changes. Upper chest: Negative. Other: Chronic compression deformity of T3 vertebral body. IMPRESSION: 1. No acute intracranial abnormality. 2. Marked brain parenchymal atrophy and chronic microvascular disease with asymmetric involvement of the left parietal lobe. 3. No evidence of acute traumatic injury to cervical spine. 4. Multilevel osteoarthritic changes of the cervical spine. 5. Chronic compression deformity of T3 vertebral body. Electronically Signed   By: Ted Mcalpine M.D.   On: 03/30/2020 17:02    ____________________________________________    PROCEDURES  Procedure(s) performed:    Marland KitchenMarland KitchenLaceration Repair  Date/Time: 03/30/2020 5:28 PM Performed by: Racheal Patches, PA-C Authorized by: Racheal Patches, PA-C   Consent:    Consent obtained:  Verbal   Consent given by:  Patient   Risks discussed:  Pain, poor wound healing and infection Anesthesia (see MAR for exact dosages):    Anesthesia method:  Topical application   Topical anesthetic:  LET Laceration details:    Location:  Scalp   Scalp location:  Occipital   Length (cm):  4 Repair type:    Repair type:  Simple Exploration:    Hemostasis achieved with:   Direct pressure   Wound exploration: wound explored through full range of motion and entire depth of wound probed and visualized     Wound extent: no foreign bodies/material noted, no underlying fracture noted and no vascular damage noted   Treatment:    Area cleansed with:  Saline and Shur-Clens   Amount of cleaning:  Extensive   Irrigation solution:  Sterile saline   Irrigation volume:  3 L Skin repair:    Repair method:  Staples   Number of staples:  3 Approximation:    Approximation:  Close Post-procedure details:    Dressing:  Open (no dressing)   Patient tolerance of procedure:  Tolerated well, no immediate complications      Medications  lidocaine-EPINEPHrine-tetracaine (LET) topical gel (has no administration in time range)     ____________________________________________   INITIAL IMPRESSION / ASSESSMENT AND PLAN / ED COURSE  Pertinent labs & imaging results that were available during my care of the patient were reviewed by me and considered in my medical decision making (see chart for details).  Review of the [place] CSRS was performed in accordance of the [facility] prior to dispensing any controlled  drugs.           Patient's diagnosis is consistent with fall, minor head injury, scalp laceration.  Patient presented to emergency department after tripping over his walker.  Patient states that he applied the brakes too aggressively, this caused him to lose his grip on his walker causing him to fall.  Patient did hit his head on the door frame.  The fall was witnessed and patient did not lose consciousness.  Patient denies any headache, vision changes.  He did have a posterior skull laceration.  Imaging reveals no acute traumatic injury.  Exam was otherwise reassuring.  Patient's laceration was closed as described above.  Patient will follow up with primary care in 1 week for staple removal.  No medications at this time.  Follow-up primary care as needed.  Patient is given ED  precautions to return to the ED for any worsening or new symptoms.     ____________________________________________  FINAL CLINICAL IMPRESSION(S) / ED DIAGNOSES  Final diagnoses:  Fall, initial encounter  Minor head injury, initial encounter  Laceration of scalp, initial encounter      NEW MEDICATIONS STARTED DURING THIS VISIT:  ED Discharge Orders    None          This chart was dictated using voice recognition software/Dragon. Despite best efforts to proofread, errors can occur which can change the meaning. Any change was purely unintentional.    Racheal Patches, PA-C 03/30/20 1729    Sharman Cheek, MD 03/30/20 2213

## 2020-03-30 NOTE — ED Notes (Signed)
[facility], group home states they must speak to Interior and spatial designer about transportation. Will call back

## 2020-03-30 NOTE — ED Notes (Signed)
Attempted to call [facility], no answer. Pt given meal tray and milk.

## 2020-03-30 NOTE — ED Notes (Signed)
Pt wet with urine. Pt's brief changed, pt cleaned with wipes

## 2020-03-30 NOTE — ED Triage Notes (Signed)
PT to ER via EMS from [facility].  Pt was walking down hall and got tangled up in his walker.  Pt hit head on door frame, noted laceration to top of scalp.  Bleeding controlled at present.  Pt denies LOC, denies other injury, alert and oriented at this time.

## 2020-03-30 NOTE — ED Notes (Signed)
This RN attempted to call [facility], x3, no answer or busy signal.

## 2020-04-08 ENCOUNTER — Encounter

## 2020-04-08 NOTE — Telephone Encounter (Signed)
 Taejon Irani  Aug 18, 1941      Call back needed: no    Preferred call back number: Call preference: Home phone   [phone] (home)    No relevant phone numbers on file.        Medications Requested:  Requested Prescriptions     Pending Prescriptions Disp Refills   . apixaban  (Eliquis ) 2.5 mg tablet       Sig: Take 1 Tablet by mouth two (2) times a day.     Pt also needs the following that are not on the active med list.    -Levothyroxine  50mcg -take one tablet by mouth daily.  -Pantoprazole  40mg -take one tablet by mouth once daily  -Rosuvastatin  10mg -take one tablet by mouth once daily  -Tamsulosin  0.4mg  -take one capsule by mouth once daily.           Preferred Pharmacy:   [facility] - [place], [place] - [address]  [address]  [place] [address]  Phone: [phone] Fax: [phone]                                       Last visit with provider:  03/11/2020    Future Appointments   Date Time Provider Department Center   09/11/2020 10:00 AM Cramm, Deward, FNP [facility]       MOST RECENT BLOOD PRESSURES  BP Readings from Last 3 Encounters:   03/11/20 (!) 145/80   02/01/20 (!) 156/76        MOST RECENT LAB DATA  No results found for: CREA, CREATEXT, K, POTEXT, ALT, TSH, TSHP, TSHEXT, CHOL, HGB, HGBEXT, HCT, HCTEXT, B12LT, B12EXT, URICACIDEXT, HBA1C, HBA1CPOC, HBA1CEXT, INR, TST5, TESFTT, TSHEXT, HGBEXT, HCTEXT, HBA1CEXT, INREXT

## 2020-04-08 NOTE — Telephone Encounter (Signed)
Needed clarification on this. Called assisted living facility to have them fax over the [facility] for patient to ensure we have all medications documented correctly.    Signed By: Candi Leash     April 08, 2020

## 2020-04-08 NOTE — Telephone Encounter (Signed)
Needed clarification on this. Called assisted living facility to have them fax over the MAR for patient to ensure we have all medications documented correctly.    Signed By: Eun Vermeer M Trevante Tennell     April 08, 2020

## 2020-04-08 NOTE — Telephone Encounter (Signed)
Ahmad Danziger  06/13/1941      Call back needed: no    Preferred call back number: Call preference: Home phone   [phone] (home)    No relevant phone numbers on file.        Medications Requested:  Requested Prescriptions     Pending Prescriptions Disp Refills   ??? apixaban (Eliquis) 2.5 mg tablet       Sig: Take 1 Tablet by mouth two (2) times a day.     Pt also needs the following that are not on the active med list.    -Levothyroxine 50mcg -take one tablet by mouth daily.  -Pantoprazole 40mg-take one tablet by mouth once daily  -Rosuvastatin 10mg-take one tablet by mouth once daily  -Tamsulosin 0.4mg -take one capsule by mouth once daily.           Preferred Pharmacy:   [facility] - [place], [place] - [address]  [address]  [place] [address]  Phone: [phone] Fax: [phone]                                       Last visit with provider:  03/11/2020    Future Appointments   Date Time Provider Department Center   09/11/2020 10:00 AM Cramm, Paul, FNP [facility]       MOST RECENT BLOOD PRESSURES  BP Readings from Last 3 Encounters:   03/11/20 (!) 145/80   02/01/20 (!) 156/76        MOST RECENT LAB DATA  No results found for: CREA, CREATEXT, K, POTEXT, ALT, TSH, TSHP, TSHEXT, CHOL, HGB, HGBEXT, HCT, HCTEXT, B12LT, B12EXT, URICACIDEXT, HBA1C, HBA1CPOC, HBA1CEXT, INR, TST5, TESFTT, TSHEXT, HGBEXT, HCTEXT, HBA1CEXT, INREXT

## 2020-04-09 MED ORDER — LEVOTHYROXINE 50 MCG TAB
50 mcg | ORAL_TABLET | Freq: Every day | ORAL | 0 refills | Status: DC
Start: 2020-04-09 — End: 2020-05-15

## 2020-04-09 MED ORDER — PANTOPRAZOLE 40 MG TAB, DELAYED RELEASE
40 mg | ORAL_TABLET | Freq: Every day | ORAL | 0 refills | Status: DC
Start: 2020-04-09 — End: 2020-05-15

## 2020-04-09 MED ORDER — APIXABAN 2.5 MG TABLET
2.5 mg | ORAL_TABLET | Freq: Two times a day (BID) | ORAL | 0 refills | Status: DC
Start: 2020-04-09 — End: 2020-07-21

## 2020-04-09 MED ORDER — ROSUVASTATIN 10 MG TAB
10 mg | ORAL_TABLET | Freq: Every evening | ORAL | 0 refills | Status: DC
Start: 2020-04-09 — End: 2020-07-03

## 2020-04-09 MED ORDER — TAMSULOSIN SR 0.4 MG 24 HR CAP
0.4 mg | ORAL_CAPSULE | Freq: Every day | ORAL | 0 refills | Status: DC
Start: 2020-04-09 — End: 2020-05-14

## 2020-04-09 NOTE — Telephone Encounter (Signed)
 Requested Prescriptions     Signed Prescriptions Disp Refills   . apixaban  (Eliquis ) 2.5 mg tablet 180 Tablet 0     Sig: Take 1 Tablet by mouth two (2) times a day.     Authorizing Provider: JACQUALIN DEWARD PARAS     Ordering User: ANTONETTA IZETTA CHRISTELLA SABRA levothyroxine  (SYNTHROID ) 50 mcg tablet 90 Tablet 0     Sig: Take 1 Tablet by mouth Daily (before breakfast).     Authorizing Provider: JACQUALIN DEWARD PARAS     Ordering User: ANTONETTA IZETTA CHRISTELLA SABRA pantoprazole  (PROTONIX ) 40 mg tablet 90 Tablet 0     Sig: Take 1 Tablet by mouth daily.     Authorizing Provider: JACQUALIN DEWARD PARAS     Ordering User: ANTONETTA IZETTA CHRISTELLA SABRA rosuvastatin  (CRESTOR ) 10 mg tablet 90 Tablet 0     Sig: Take 1 Tablet by mouth nightly.     Authorizing Provider: JACQUALIN DEWARD PARAS     Ordering User: ANTONETTA IZETTA CHRISTELLA SABRA tamsulosin  (Flomax ) 0.4 mg capsule 90 Capsule 0     Sig: Take 1 Capsule by mouth daily.     Authorizing Provider: JACQUALIN DEWARD PARAS     Ordering User: ANTONETTA IZETTA CHRISTELLA     Call back needed: no  Medication(s): see above  Quantity: 90 day                                         Pharmacy:  [facility]  Prescriber:   Cramm  Last appt @ PCP Office: 03/11/2020  Future Appointments   Date Time Provider Department Center   09/11/2020 10:00 AM Cramm, DEWARD, FNP [facility]       MOST RECENT BLOOD PRESSURES  BP Readings from Last 3 Encounters:   03/11/20 (!) 145/80   02/01/20 (!) 156/76        MOST RECENT LAB DATA  No results found for: CREA, K, ALT, TSH, CHOL, HGB, HCT, B12LT, HBA1C, HBA1CPOC, INR, TSHEXT, HGBEXT, HCTEXT, HBA1CEXT, INREXT

## 2020-04-09 NOTE — Telephone Encounter (Signed)
Requested Prescriptions     Signed Prescriptions Disp Refills   ??? apixaban (Eliquis) 2.5 mg tablet 180 Tablet 0     Sig: Take 1 Tablet by mouth two (2) times a day.     Authorizing Provider: CRAMM, PAUL J     Ordering User: Daunte Oestreich M   ??? levothyroxine (SYNTHROID) 50 mcg tablet 90 Tablet 0     Sig: Take 1 Tablet by mouth Daily (before breakfast).     Authorizing Provider: CRAMM, PAUL J     Ordering User: Jaylan Duggar M   ??? pantoprazole (PROTONIX) 40 mg tablet 90 Tablet 0     Sig: Take 1 Tablet by mouth daily.     Authorizing Provider: CRAMM, PAUL J     Ordering User: Zachery Niswander M   ??? rosuvastatin (CRESTOR) 10 mg tablet 90 Tablet 0     Sig: Take 1 Tablet by mouth nightly.     Authorizing Provider: CRAMM, PAUL J     Ordering User: Guido Comp M   ??? tamsulosin (Flomax) 0.4 mg capsule 90 Capsule 0     Sig: Take 1 Capsule by mouth daily.     Authorizing Provider: CRAMM, PAUL J     Ordering User: Deron Poole M     Call back needed: no  Medication(s): see above  Quantity: 90 day                                         Pharmacy:  [facility]  Prescriber:   Cramm  Last appt @ PCP Office: 03/11/2020  Future Appointments   Date Time Provider Department Center   09/11/2020 10:00 AM Cramm, Paul, FNP [facility]       MOST RECENT BLOOD PRESSURES  BP Readings from Last 3 Encounters:   03/11/20 (!) 145/80   02/01/20 (!) 156/76        MOST RECENT LAB DATA  No results found for: CREA, K, ALT, TSH, CHOL, HGB, HCT, B12LT, HBA1C, HBA1CPOC, INR, TSHEXT, HGBEXT, HCTEXT, HBA1CEXT, INREXT

## 2020-04-14 ENCOUNTER — Telehealth

## 2020-04-14 NOTE — Telephone Encounter (Signed)
I have queued up referral. Please associate diagnosis.   Thanks,  Jessi CMA  For   [employer] FNP

## 2020-04-14 NOTE — Telephone Encounter (Signed)
Order entered for outpatient physical therapy with [facility].

## 2020-04-14 NOTE — Telephone Encounter (Signed)
Alejandro [name] called from [facility] and states that pt needs a referral to [facility] for outpatient. Family is also requesting that POA document be faxed with the referral.     Fax: [phone]

## 2020-04-16 NOTE — Telephone Encounter (Signed)
Gershon Cull is calling because pt has been incredibly agitated and angry lately and they are wondering if pt is able to get something to help with this as a PRN. Gershon Cull would like a call back from pcp or ma.

## 2020-04-16 NOTE — Telephone Encounter (Signed)
Pt's nephew is calling because the pt's previous cardiologist is looking to speak with pcp. The cardiologists office is [facility] [phone]. Pt's nephew was told that pt is on some equipment and medication that they need to get the pt started on. Please advise.

## 2020-04-16 NOTE — Telephone Encounter (Signed)
Please ask Gershon Cull to increase Seroquel to 50mg  PO BID and see if he calms down.    Also,please let nephew know I called [facility],spoke with and the last time he was seen there was a year ago and they do not have any immediate notes from any cardiologist. Please ask nephew to see what he is talking about and then we can follow up on it further.

## 2020-04-17 NOTE — Telephone Encounter (Signed)
I have sent the new order for med change over to [facility] and they are aware of this.   Spoke to nephew and he stated no idea about the equipment he said it sounds like a miscommunication.   He thanked [place] for calling

## 2020-04-18 NOTE — Telephone Encounter (Signed)
[facility] for [facility] called stating pt will be extended to 2 times a week for 2 weeks to establish an exercise program.

## 2020-04-18 NOTE — Telephone Encounter (Signed)
noted 

## 2020-05-01 NOTE — Telephone Encounter (Signed)
Alejandro [name]-- should this patient be on both Seroquel and Aricept? They interact with each other.  According to his [facility] in March he was on both. This was last ordered by his previous provider in [place]. Please advise     Signed By: Candi Leash     May 01, 2020

## 2020-05-01 NOTE — Telephone Encounter (Signed)
 Alejandro [name]  July 19, 1941      Call back needed: no    Preferred call back number: Call preference: Home phone   [phone] (home)    No relevant phone numbers on file.        Medications Requested:  Requested Prescriptions     Pending Prescriptions Disp Refills   . QUEtiapine  (SEROquel ) 25 mg tablet       Sig: Take 1 Tablet by mouth two (2) times a day. For psychosis             Preferred Pharmacy:   [facility] - [place], [place] - [address]  [address]  [place] [address]  Phone: [phone] Fax: [phone]                                       Last visit with provider:  03/11/2020    Future Appointments   Date Time Provider Department Center   09/11/2020 10:00 AM Cramm, Deward, FNP [facility]       MOST RECENT BLOOD PRESSURES  BP Readings from Last 3 Encounters:   03/11/20 (!) 145/80   02/01/20 (!) 156/76        MOST RECENT LAB DATA  No results found for: CREA, CREATEXT, K, POTEXT, ALT, TSH, TSHP, TSHEXT, CHOL, HGB, HGBEXT, HCT, HCTEXT, B12LT, B12EXT, URICACIDEXT, HBA1C, HBA1CPOC, HBA1CEXT, INR, TST5, TESFTT, TSHEXT, HGBEXT, HCTEXT, HBA1CEXT, INREXT, TSHEXT, HGBEXT, HCTEXT, HBA1CEXT, INREXT

## 2020-05-02 NOTE — Telephone Encounter (Signed)
Alejandro [name] called and states that the pt was Discharged from PT and home health agency with goes mostly met

## 2020-05-02 NOTE — Telephone Encounter (Signed)
-----   Message from Ramon Dredge, FNP sent at 05/02/2020  7:45 AM EDT -----  Regarding: dementia patient  Good morning,   Wanted to reach out to get some advice. This patient is new to me. He came to [place] from [place] to be close to local family who are his POAs, a niece and nephew. He is on the dementia unit at [facility]- I have been there many times as a home health and hospice nurse- lower patient to staff ration, all meds administered by staff, assistance with all ADLs.    He is on Seroquel and Aricept - I questioned this as much of this was initially ordered by his [place] - however, I am assuming responsibility as his PCP - I'm looking for advice and guidance on his meds - would you please review meds and let me know your recommendations. Thanks and have a nice Friday+weekend - Renae Fickle

## 2020-05-02 NOTE — Telephone Encounter (Signed)
Noted. Will send to PCP to be aware.    Alejandro [name]

## 2020-05-02 NOTE — Telephone Encounter (Signed)
Thanks Lannette Donath, this is incredibly helpful. Have a nice weekend.

## 2020-05-02 NOTE — Telephone Encounter (Signed)
 Hi Paul,     There is a black box warning for the use of Antipsychotics in patients with dementia. They increase the risk of death in this population. They should only be used when the patient is experiencing psychosis and is at risk of causing harm to themselves or others. Unfortunately, antipsychotics are over prescribed in this population for behavior disturbances which is listed in his past medical problem list along with his AD diagnosis. If these behavior disturbances are causing significant harm and cannot be corrected with lifestyle/counseling then it may be appropriate however I do not have enough information or expertise to make that call.     Here's some more information regarding the general management of behavioral disturbances in dementia:   Nonpharmacologic interventions   Include behavioral and environmental interventions for patients as well as interventions to support caregivers.   Identify possible triggers of behavior (including medications).   Consider factors in patient's environment such as over- or understimulation that may contribute to symptoms and correct or address those if possible.   Consider activity-based interventions for short-term (but not long-term) improvement in behavior.   Consider electroconvulsive therapy to reduce symptoms of aggression and agitation in patients with dementia.   Sensory interventions with limited evidence or no evidence of efficacy include massage, aromatherapy, music therapy, and light therapy    Medications   Antipsychotics are not recommended for the treatment of disruptive behaviors in patients with dementia (unless nonpharmacological options have failed and the patient is threat to self or others) (Strong recommendation).   Antipsychotics are not routinely recommended for treatment of behavioral or psychological symptoms associated with dementia by any professional organization due to an increased risk of mortality and other adverse events  including stroke.   For patients currently taking antipsychotic medication, taper the dose when discontinuing the medication to reduce risk of discontinuation reactions.   Several first- and second-generation antipsychotics have some evidence of efficacy in the treatment of behavioral disturbances in patients with dementia.   Other medications that have some (usually limited) evidence of efficacy for behavioral symptoms of dementia include:   analgesics   gabapentin   antidepressants such as   citalopram   sertraline   clomipramine   memantine   Prazosin     For patients with dementia displaying inappropriate sexual behavior, management should involve caregivers and families with treatment goal of promoting appropriate behavior, not elimination of sexual behavior.  Management options include:   Environmental changes that include adjustment of social cue misinterpretation or stimulating events.   Educational changes directed at caregivers, family, and partners.   Sex-education focused on sexuality in the context of aging.   Supportive psychotherapy aimed at helping partner reframe context of patient's sexual requests.   Behavioral approaches, including redirection of inappropriate behavior and distraction. For example, by addition of social or crafting activities.   Trial of pharmacologic treatment, although evidence for efficacy is limited, deriving from case-series and case-reports of specific medications used in patients representing a variety of subtypes of dementia.    Current Medications:   1. Seroquel  (BEERs List BBW death)  2. APAP  3. Amlodipine   4. apixaban  (Indication? Pacemaker. Dose Appropriate for renal function? See below)  5. Benzapril (Renal see below)  6. Bisacodyl (Laxative - constipation d/t anticholinergic methocarbamol?)  7. Cetirizine  (Renal see below)  8. Donepezil   9. Glucagon  10. Novolog   11. Basaglar    12. Synthroid  (Absorption decreased by Magnesium ?)  13. Magnesium   citrate (Laxative - constipation  d/t anticholinergic methocarbamol?)  14. Milk of Magnesia (Laxative - constipation d/t anticholinergic methocarbamol?)  15. Methocarbamol (BEERs list)  16. Mineral oil enema (Laxative - constipation d/t anticholinergic methocarbamol?)  17. Multivitamin  18. Protonix  (is this still needed? Prolonged therapy is common)  19. Artifical tears (Dry eye d/t anticholinergic methocarbamol?)  20. Crestor  (Renal see below)  21. Fleet enema (Laxative - constipation d/t anticholinergic methocarbamol?)  22. Tamsulosin  (BPH, improves urine flow.. methocarbamol could be worsening urinary retention)    I would avoid the use of skeletal muscle relaxants such as methocarbamol if possible. These agents are listed on the BEERs criteria as Avoid poorly tolerated by older adults because some have anticholinergic adverse effects, sedation, and increased risk of fractures.     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 It interacts with the magnesium  laxatives. They bind to it preventing it's absorption.    The other thing I would typically check in on is whether the patients renal function is appropriate for all of these medications. His renal function is not currently available.     The drugs that would need to be adjusted if his renal function is declined to varying levels:     Apixaban   Pt has a pacemaker. Unsure if he still has AF. Consider what indication for Apixaban  is and whether treatment is appropriate.  It is already dosed at the lower dose of 2.5 mg BID. Typically the dose is reduced from 5 mg twice daily to 2.5 mg twice daily if the patient  has 2 of 3 criteria: Nonvalvular atrial fibrillation (to prevent stroke and systemic embolism): If patient is ?77 years of age and either weighs ?60 kg or has a serum creatinine ?1.5 mg/dL (866 mcmol/L), then reduce dose to 2.5 mg twice daily.  Pt doesn't currently meet those criteria (he is 78 years old, 73.4 kg, Scr is unknown).  Also if CrCl <25 ml/min avoid use of Apixaban  completely.      Cetirizine   CrCl >31 mL/minute: No dosage adjustment necessary.  CrCl 11 to ?31 mL/minute: 5 mg once daily.  CrCl ?10 mL/minute: 5 mg once every 48 hours; may increase to 5 mg once daily based on tolerability and response for short-term use only (drug may accumulate with prolonged use at this dose [expert opinion]).    Magnesium  Products  There are no dosage adjustments provided in the manufacturer's labeling; however, magnesium  is renally excreted. Use caution; accumulation of magnesium  in renal impairment may lead to magnesium  toxicity.    Crestor   Altered kidney function:  CrCl ?30 mL/minute/1.73 m2: No dosage adjustment necessary.  CrCl <30 mL/minute/1.73 m2: 5 to 10 mg once daily.    Tamsulosin   Has not been studied with CrCl <10 use with caution but no recommendations provided.    Methocarbamol  No recommendations available, use with caution.    Benazepril  CrCl <30 mL/minute/1.72m2:  Initial: 5 mg once daily; maximum dose: 40 mg/day      In general, the best option is to de prescribe anything no longer medically necessary to reduce the risk of adverse drug reactions or drug interactions. I would check [employer] for  the Scr / eGFR however, I am having trouble accessing it at home. Once you've gathered information on his renal function you'll want to determine whether the medications he is currently prescribed need to be dose adjusted based on the meds of concern I highlighted. Always dose anticoagulants such as apixaban  based on CrCl calculated with actual body weight (73.4 kg) unless they are obese BMI >30. Most  other meds are usually dosed based on CrCl calculated with Ideal body weight. IBW for men: 50 + 2.3 x 11(height) = 75.3 kg. However, since his actual body weight is less than his ideal body weight you would need to use actual body weight for CrCl for all the medications.    CrCl =[ [(140 - age) x ABW 75.3 kg] / (72 x Scr) ] * (0.85 (only for females))    Let me know if you have any other questions. Thanks,  Kacie Guerrette, PharmD.

## 2020-05-06 MED ORDER — BD AUTOSHIELD DUO PEN NEEDLE 30 GAUGE X 3/16"
30 gauge x 3/16" | PEN_INJECTOR | 3 refills | Status: DC
Start: 2020-05-06 — End: 2020-06-10

## 2020-05-06 MED ORDER — QUETIAPINE 50 MG TAB
50 mg | ORAL_TABLET | Freq: Two times a day (BID) | ORAL | 0 refills | Status: DC
Start: 2020-05-06 — End: 2020-08-19

## 2020-05-06 MED ORDER — PRODIGY NO CODING STRIPS
ORAL_STRIP | 3 refills | Status: DC
Start: 2020-05-06 — End: 2020-07-28

## 2020-05-06 MED ORDER — LANCETS
3 refills | Status: DC
Start: 2020-05-06 — End: 2020-08-27

## 2020-05-06 MED ORDER — INSULIN SYRINGE U-100 WITH NEEDLE 0.3 ML 30
0.3 mL 30 | INJECTION | Freq: Four times a day (QID) | 3 refills | Status: AC
Start: 2020-05-06 — End: ?

## 2020-05-06 NOTE — Telephone Encounter (Signed)
When Dr. Glade Lloyd was covering he increased his Seroquel to 50mg  PO BID for patient's increased agitation and anger on 04/16/2020 (see phone note). This is the same medication we discussed discontinuing due to being on Aricept as well.     Please advise-- should patient remain on medication for behavioral concerns?    Signed By: Candi Leash     May 06, 2020

## 2020-05-06 NOTE — Telephone Encounter (Signed)
Patient eats 100% of all 3 meals and has snacks in between. He has dessert and sweets every day.    Glucose readings are:  373  259  342  125  173  155  226  389  Patient has not missed any doses.    Signed By: Candi Leash     May 06, 2020

## 2020-05-06 NOTE — Telephone Encounter (Signed)
Requested Prescriptions     Pending Prescriptions Disp Refills   . QUEtiapine (SEROquel) 50 mg tablet 180 Tablet 0     Sig: Take 1 Tablet by mouth two (2) times a day.     Call back needed: no  Medication(s): see above  Quantity: 90 day                                      Pharmacy:  [facility]  Prescriber:   Cramm  Last appt @ PCP Office: 03/11/2020  Future Appointments   Date Time Provider Department Center   09/11/2020 10:00 AM Cramm, Renae Fickle, FNP [facility]       MOST RECENT BLOOD PRESSURES  BP Readings from Last 3 Encounters:   03/11/20 (!) 145/80   02/01/20 (!) 156/76        MOST RECENT LAB DATA  No results found for: CREA, K, ALT, TSH, CHOL, HGB, HCT, B12LT, HBA1C, HBA1CPOC, INR, TSHEXT, HGBEXT, HCTEXT, HBA1CEXT, INREXT

## 2020-05-06 NOTE — Telephone Encounter (Signed)
Maralyn Sago - staff at [facility] calling.  Patient is advised to contact provider when blood sugar is higher than 400.    Patient has blood sugar of 419, Maralyn Sago said it has been running consistent in high 300's for a while.      Please advise  [phone]

## 2020-05-06 NOTE — Telephone Encounter (Signed)
Alejandro [name] @ [facility] returned a call from the office. They would like a call back from MA.

## 2020-05-06 NOTE — Telephone Encounter (Signed)
[name] calling to request an updated order and refills for the following sent into [facility]:  Seroquel 50mg  -BID (this was increased on 06/10)  -All Diabetic testing supplies  -Autosheild safety Pen Needles  -Novolog Injection Suppies (syringes and needles)  08/10 with questions.

## 2020-05-06 NOTE — Telephone Encounter (Signed)
Need to know if he is eating 3 meals a day.  In general what he is eating - 1/2 of meal, all of meal- does he have access to sweet desserts, chocolate bars, pepsi etc.    What are his past blood glucose readings for last 3 days.    Has he always received his insulin - for example was he aggressive and staff were unable to give.    Afterwards I'll be able to up dose of insulins if necessary    Thanks

## 2020-05-13 MED ORDER — INSULIN ASPART 100 UNIT/ML INJECTION
100 unit/mL | SUBCUTANEOUS | 3 refills | Status: DC
Start: 2020-05-13 — End: 2020-05-14

## 2020-05-13 MED ORDER — BASAGLAR KWIKPEN U-100 INSULIN 100 UNIT/ML (3 ML) SUBCUTANEOUS
100 unit/mL (3 mL) | PEN_INJECTOR | SUBCUTANEOUS | 1 refills | Status: DC
Start: 2020-05-13 — End: 2020-05-14

## 2020-05-13 NOTE — Telephone Encounter (Signed)
Per discussion with Florentina Addison MA; my understanding is that [place] and [facility] have been made aware of current insulin regime. Will continue to follow blood glucose readings closely and adjust dosing as needed.  Please contact [name] 05/14/20 in AM to obtain blood glucose readings.

## 2020-05-13 NOTE — Telephone Encounter (Signed)
Sarah from [facility] called to report a blood sugar reading od 445 this morning. She stated that this was taken at 745 am and was before the pt had breakfast.  Please advise  [phone]

## 2020-05-13 NOTE — Telephone Encounter (Signed)
 Patient's blood glucose readings for July 3rd are: 441, 217, 147, 250.    CRMA Lauraine reports the patient continues to sneak food and this has been an ongoing issue with the patient.    Sarah's instructions for the Novalog on the patients MAR read: 10 units Breakfast, lunch, dinner and 8PM use sliding scale. -- this does not match up with what we have in the patinets chart.    His readings today are 446 @ 8AM and 369 @ 11AM    Signed By: Izetta CHRISTELLA Pesa     May 13, 2020

## 2020-05-14 MED ORDER — OTHER
0 refills | Status: DC
Start: 2020-05-14 — End: 2020-05-14

## 2020-05-14 MED ORDER — INSULIN ASPART 100 UNIT/ML INJECTION
100 unit/mL | SUBCUTANEOUS | 3 refills | Status: DC
Start: 2020-05-14 — End: 2020-05-14

## 2020-05-14 MED ORDER — BASAGLAR KWIKPEN U-100 INSULIN 100 UNIT/ML (3 ML) SUBCUTANEOUS
100 unit/mL (3 mL) | PEN_INJECTOR | SUBCUTANEOUS | 1 refills | Status: DC
Start: 2020-05-14 — End: 2020-05-27

## 2020-05-14 MED ORDER — INSULIN ASPART 100 UNIT/ML INJECTION
100 unit/mL | SUBCUTANEOUS | 3 refills | Status: DC
Start: 2020-05-14 — End: 2020-05-27

## 2020-05-14 MED ORDER — MULTIVITAMIN TAB
ORAL_TABLET | Freq: Every day | ORAL | 3 refills | Status: DC
Start: 2020-05-14 — End: 2021-03-06

## 2020-05-14 MED ORDER — OTHER
0 refills | Status: AC
Start: 2020-05-14 — End: ?

## 2020-05-14 MED ORDER — TAMSULOSIN SR 0.4 MG 24 HR CAP
0.4 mg | ORAL_CAPSULE | Freq: Every day | ORAL | 0 refills | Status: DC
Start: 2020-05-14 — End: 2020-09-22

## 2020-05-14 NOTE — Telephone Encounter (Signed)
[name] called. She stated that she has been speaking to pt about is blood sugar issues and the pt would like to come in to see PCP to discuss in person.   No available appts until end of August and [name] would like to have the pt seen this month by PCP.  Please advise  [phone]

## 2020-05-14 NOTE — Telephone Encounter (Signed)
Called [name] and set patient up with July 19th appointment at [facility] to discuss DM and high blood glucose.    Signed By: Candi Leash     May 14, 2020

## 2020-05-14 NOTE — Telephone Encounter (Signed)
Compared both MAR from [facility] and current med list. Reviewed and advised as needed. Sent revised MAR to [facility] to have them change necessary orders.    Signed By: Candi Leash     May 14, 2020

## 2020-05-15 MED ORDER — LEVOTHYROXINE 50 MCG TAB
50 mcg | ORAL_TABLET | Freq: Every day | ORAL | 0 refills | Status: DC
Start: 2020-05-15 — End: 2020-09-24

## 2020-05-15 MED ORDER — PANTOPRAZOLE 40 MG TAB, DELAYED RELEASE
40 mg | ORAL_TABLET | Freq: Every day | ORAL | 0 refills | Status: DC
Start: 2020-05-15 — End: 2020-09-22

## 2020-05-15 NOTE — Telephone Encounter (Signed)
noted 

## 2020-05-15 NOTE — Telephone Encounter (Signed)
Called and talked to [facility], sent over orders for Pantoprazole and Levothyroxine. Still in the process of cleaning up the [facility].    Signed By: Candi Leash     May 15, 2020

## 2020-05-15 NOTE — Telephone Encounter (Signed)
reviewed

## 2020-05-15 NOTE — Telephone Encounter (Signed)
[facility] calling from [facility] in response to the DC orders that need to stay on the [facility] at the facility for the pt. Caller can be reached at [phone].

## 2020-05-16 NOTE — Telephone Encounter (Signed)
This note is still open, please address and sign off.

## 2020-05-20 NOTE — Telephone Encounter (Signed)
Patient's niece [name] called to reschedule the patients 07/19 appointment. Next available isn't until Sep. Please advise.

## 2020-05-20 NOTE — Telephone Encounter (Signed)
Patient scheduled for July 27th @ 11.    Signed By: Candi Leash     May 20, 2020

## 2020-05-27 ENCOUNTER — Telehealth

## 2020-05-27 MED ORDER — INSULIN ASPART 100 UNIT/ML INJECTION
100 unit/mL | SUBCUTANEOUS | 3 refills | Status: DC
Start: 2020-05-27 — End: 2020-06-10

## 2020-05-27 MED ORDER — BASAGLAR KWIKPEN U-100 INSULIN 100 UNIT/ML (3 ML) SUBCUTANEOUS
100 unit/mL (3 mL) | PEN_INJECTOR | SUBCUTANEOUS | 1 refills | Status: DC
Start: 2020-05-27 — End: 2020-06-10

## 2020-05-27 NOTE — Telephone Encounter (Signed)
RX faxed and [facility] aware.    Signed By: Candi Leash     May 27, 2020

## 2020-05-27 NOTE — Telephone Encounter (Signed)
Blood sugars are taken every day at 8AM, 12PM, 5PM, and 8PM    FRI- 338, 338, 260, 346    SAT- 267, 238, 202, 296    SUN- 291, 279, 261, 265    MON- 207, 369, 296, 291    Signed By: Candi Leash     May 27, 2020

## 2020-05-27 NOTE — Telephone Encounter (Signed)
Insulin glargine increased to 36 units in AM.    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  0-150 = 0 units under 60 call MD; 151-200 = 3 units 201-250 = 5  units 251-300 = 7 units 301-350 = 9  units 351-400 = 11 units Greater 400 call MD    Please communicate to [facility] and Ottie Glazier.

## 2020-05-27 NOTE — Telephone Encounter (Signed)
Sarah from [facility] called to report pts blood sugar was 410 this morning. Pt is having no symptoms of sugar being high. Please call Maralyn Sago w/ any questions [phone]

## 2020-05-27 NOTE — Telephone Encounter (Signed)
Alejandro [name], can we get some recent blood glucose readings from [place] so we can adjust insulin. Thanks

## 2020-05-29 NOTE — Telephone Encounter (Signed)
Patient has been still in the mid to high 200s    [facility] has not updated the sliding scale according to Maralyn Sago at [place] so she has been using the same script.    I will place a call over the [facility] to have them complete this.      Signed By: Candi Leash     May 29, 2020

## 2020-05-29 NOTE — Telephone Encounter (Signed)
Please call Alejandro [name] this afternoon to check and see what effect recent increases in insulin have had on patient's blood glucose readings. Double check to make sure he is not refusing doses as he has dementia. Thanks

## 2020-06-03 ENCOUNTER — Telehealth

## 2020-06-03 ENCOUNTER — Ambulatory Visit: Attending: Registered Nurse | Primary: Registered Nurse

## 2020-06-03 ENCOUNTER — Ambulatory Visit: Admit: 2020-06-03 | Discharge: 2020-06-03 | Payer: MEDICARE | Attending: Registered Nurse | Primary: Registered Nurse

## 2020-06-03 DIAGNOSIS — E119 Type 2 diabetes mellitus without complications: Secondary | ICD-10-CM

## 2020-06-03 NOTE — Telephone Encounter (Signed)
Patient was referred to our diabetes clinic in March, 2021 by Ranae Plumber - please check and see if this can be scheduled.  Thanks

## 2020-06-03 NOTE — Assessment & Plan Note (Signed)
Will reconfirm appropriate medication regime with patient's pharmacy and [facility].

## 2020-06-03 NOTE — Telephone Encounter (Signed)
Please check status of scheduling patient for an appointment with Dr. Kris Hartmann, psychiatrist.  patient was referred in May, 2021 for assessment of advancing dementia , medication recommendations for patient's  Dementia.  Thanks

## 2020-06-03 NOTE — Telephone Encounter (Signed)
Called Dr. Robina Ade office to see if a DOS has been set up yet and they do not have the referral for this patient.    Alejandro [name]-- could this be resent? Thanks!    Signed By: Candi Leash     June 03, 2020

## 2020-06-03 NOTE — Assessment & Plan Note (Signed)
Blood pressure is good in office today.  Continue current antihypertensive medication regime.

## 2020-06-03 NOTE — Telephone Encounter (Signed)
Noted  

## 2020-06-03 NOTE — Assessment & Plan Note (Signed)
Will followup on psychiatry referral to Dr. Hinton Dyer Singer's office, patient was referred in May, 2021 for assessment of advancing dementia per, medication recommendations for patient's  Dementia.

## 2020-06-03 NOTE — Telephone Encounter (Signed)
Called and left message for Endocrinology asking for more information on referral and DOS.    Signed By: Candi Leash     June 03, 2020

## 2020-06-03 NOTE — Progress Notes (Signed)
[facility] INTERNAL MEDICINE   [address]  [place] [address]  [phone]  Alejandro [name]  1941/10/03      Impression/Plan: Will see patient November 4th 2021 for followup, dementia, insulin dependent diabetes mellitus.    Will follow-up on referrals to Psychiatry with Dr. Kris Hartmann for assessment of dementia and medication recommendations for patient's dementia - message sent for staff to check on scheduling of same.    Will follow-up on referral to [facility]- message sent for staff to check on scheduling of same.      Will refer patient to our dermatologist, Dr. Lesli Albee for assessment of black lesion to external right ear that is new within the past year.    Chronic Conditions Addressed Today     1. Late onset Alzheimer's dementia with behavioral disturbance [facility])      Will followup on psychiatry referral to Dr. Hinton Dyer Singer's office, patient was referred in May, 2021 for assessment of advancing dementia per, medication recommendations for patient's  Dementia.             2. Type 2 diabetes mellitus treated with insulin (HCC) - Primary      Will reconfirm appropriate medication regime with patient's pharmacy and retirement home.           3. Hypertension      Blood pressure is good in office today.  Continue current antihypertensive medication regime.         4. Hemiplegia and hemiparesis following cerebral infarction affecting left non-dominant side (HCC)    5. Lesion of right external ear      Will refer to our dermatologist, Dr. Lesli Albee          Relevant Orders     REFERRAL TO DERMATOLOGY           Chief Compliant: Alejandro [name] presents today for follow-up, insulin dependent diabetes mellitus, dementia.  Chief Complaint   Patient presents with   ??? Diabetes         HPI:  79 year old gentleman who is here for follow-up, insulin dependent diabetes mellitus, dementia.  Patient is here with his niece, Alejandro [name] who is his medical power of attorney.    Dementia  Patient is curious to  know why his memories are good some days while other days he can't remember past events.  He is eager to be more connected with the [facility], as he is a Air traffic controller.  Patient's niece Alejandro [name] tells me that unfortunately due to staff shortages, there are not enough staff to accompany patient's from the dementia unit to church services.  Patient verbalizes that he is content at his retirement home.  Patient's niece Alejandro [name] is wondering about referral to psychiatry for assessment of patient's dementia.    Insulin-dependent diabetes mellitus  Patient is curious to know what normal blood glucose levels would be.  Patient's appetite is good.  He does eat desserts.  Patient's niece Alejandro [name] is concerned about patient receiving appropriately ordered insulin as there have been some communication challenges with retirement home and pharmacy.    Patient tells me he is eating well, he enjoys the food at the retirement home that he currently stays at.  He reports he sleeps well at night, does not take naps during the day.  He says his energy is good.  Reports he has no numbness or tingling to his feet.  Tells me he cuts his own toenails and showers himself.  Patient's niece Alejandro [name] is wondering where things  are [facility]      Black lesion to external right ear  This site has been present for no more than 1 year.      Current Outpatient Medications   Medication Instructions   ??? acetaminophen (TYLENOL) 325 mg, Oral, EVERY 4 HOURS AS NEEDED   ??? amLODIPine (NORVASC) 10 mg, Oral, DAILY   ??? apixaban (ELIQUIS) 2.5 mg, Oral, 2 TIMES DAILY   ??? benazepriL (LOTENSIN) 5 mg, Oral, 2 TIMES DAILY   ??? cetirizine (ZYRTEC) 10 mg, Oral, DAILY   ??? donepeziL (ARICEPT) 5 mg tablet Oral, EVERY BEDTIME   ??? glucagon 1 mg, IntraVENous, ONCE   ??? glucose blood VI test strips (Prodigy No Coding) strip Use one test strip to test blood glucose 4 times daily   ??? insulin aspart U-100 (NOVOLOG) 100 unit/mL injection  For blood sugar ac and hs for glucose control, related to Type 2 DM.  0-150 = 0 units under 60 call MD; 151-200 = 3 units 201-250 = 5  units 251-300 = 7 units 301-350 = 9  units 351-400 = 11 units Greater 400 call MD   ??? insulin glargine (Basaglar KwikPen U-100 Insulin) 100 unit/mL (3 mL) inpn 36 units subcutaneously every morning   ??? Insulin Syringe-Needle U-100 0.3 mL 30 syrg 1 Each, SubCUTAneous, 4 TIMES DAILY   ??? lancets misc Use one lancet to test blood glucose 4 times daily   ??? levothyroxine (SYNTHROID) 50 mcg, Oral, DAILY BEFORE BREAKFAST   ??? magnesium citrate solution 296 mL, Oral, NOW, Prn if no results after enema. If no results in 1 hour of completion of bowl protocol, call MD immediately for further orders.    ??? magnesium hydroxide (Phillips Milk of Magnesia) 400 mg/5 mL suspension 30 mL, Oral, DAILY PRN, Prn if no BM in 3 days   ??? multivitamin (ONE A DAY) tablet 1 Tablet, Oral, DAILY   ??? OTHER Monitor Blood Glucose 4 times daily, before meals and at 8PM   ??? pantoprazole (PROTONIX) 40 mg, Oral, DAILY   ??? pen needle,diabetic dual safty (BD AutoShield Duo Pen Needle) 30 gauge x 3/16" ndle Use one pen needle 4 times daily   ??? QUEtiapine (SEROQUEL) 50 mg, Oral, 2 TIMES DAILY   ??? rosuvastatin (CRESTOR) 10 mg, Oral, EVERY BEDTIME   ??? tamsulosin (FLOMAX) 0.4 mg, Oral, DAILY        Allergies   Allergen Reactions   ??? Shellfish Derived Anaphylaxis        Review of Systems   Constitutional: Negative.    Respiratory: Negative.    Cardiovascular: Negative.    Gastrointestinal: Negative.    Genitourinary: Negative.    Musculoskeletal: Negative.    Skin:        Black spot to right ear is new within the past year   Neurological: Negative for dizziness, tingling, tremors, sensory change, speech change, focal weakness, seizures, loss of consciousness, weakness and headaches.   Endo/Heme/Allergies:        No numbness or tingling to feet  No naps during the daytime  Sleeps good at night   Psychiatric/Behavioral: Positive  for memory loss. The patient does not have insomnia.         Patient is curious to know why his memories are good some days while other days he can't remember past events.        Vitals:    06/03/20 1126   BP: 132/60   Pulse: 80  Weight: 168 lb 3.2 oz (76.3 kg)   Height: 5\' 11"  (1.803 m)       Physical Exam  Constitutional:       General: He is not in acute distress.     Appearance: Normal appearance. He is normal weight. He is not ill-appearing, toxic-appearing or diaphoretic.   HENT:      Head: Normocephalic and atraumatic.      Nose: Nose normal.   Eyes:      General: No scleral icterus.        Right eye: No discharge.         Left eye: No discharge.      Conjunctiva/sclera: Conjunctivae normal.   Cardiovascular:      Rate and Rhythm: Normal rate.      Pulses: Normal pulses.      Heart sounds: No murmur heard.   No friction rub. No gallop.       Comments: Paced rhythm  Pulmonary:      Effort: Pulmonary effort is normal. No respiratory distress.      Breath sounds: Normal breath sounds. No stridor. No wheezing, rhonchi or rales.   Chest:      Chest wall: No tenderness.   Abdominal:      General: Bowel sounds are normal. There is no distension.      Palpations: Abdomen is soft.      Tenderness: There is no abdominal tenderness.   Musculoskeletal:         General: No swelling, tenderness, deformity or signs of injury. Normal range of motion.      Cervical back: Normal range of motion and neck supple. No rigidity or tenderness.      Right lower leg: No edema.      Left lower leg: No edema.   Lymphadenopathy:      Cervical: No cervical adenopathy.   Skin:     General: Skin is warm and dry.      Capillary Refill: Capillary refill takes less than 2 seconds.      Coloration: Skin is not jaundiced or pale.      Findings: Lesion present. No bruising, erythema or rash.      Nails: There is no clubbing.      Comments: Black lesion to external right ear at anti helix that is new within this past year, 0.6 cm length x 0.6 cm  width x 0.1 cm height.    Feet have intact skin, no breakdown, toenails are not thickened, pedal pulses are easily palpable.   Neurological:      General: No focal deficit present.      Mental Status: He is alert and oriented to person, place, and time.      Motor: No weakness.      Coordination: Coordination normal.      Gait: Gait normal.   Psychiatric:         Mood and Affect: Mood normal.      Comments: Advancing dementia  Judgment challenges  Reasoning challenges        Thirty-eight minutes spent on this office visit reviewing medical records, documenting, interviewing assessing, planning, educating.  I reviewed and the patient's Past Medical, Surgical, Family and Social History in addition to their Problems, Meds and Allergies.  Recent results reviewed  Any problems listed in the Assessment and Plan were assessed during today's visit and if not explicitly discussed are stable based on history, physical exam, review of pertinent labs, studies and medications.  Follow-up and Dispositions    ?? Return for f/u as scheduled in November for dementia, insulin dependent diabetes mellitus..       Future Appointments   Date Time Provider Department Center   09/11/2020 10:00 AM Ramon Dredge, FNP [facility] INT MED       Patient was seen by Ramon Dredge, FNP and  Ramon Dredge, FNP  06/03/2020      [facility] INTERNAL MEDICINE   [address] [place] [address]  [phone]

## 2020-06-03 NOTE — Assessment & Plan Note (Signed)
Will refer to our dermatologist, Dr. Lesli Albee

## 2020-06-04 NOTE — Telephone Encounter (Signed)
I have refaxed referral a this time

## 2020-06-04 NOTE — Telephone Encounter (Signed)
Renae Fickle the referral needs to be changed to Endocrinology. I have qued up the new order

## 2020-06-05 NOTE — Telephone Encounter (Signed)
Ordered with details added.

## 2020-06-06 NOTE — Telephone Encounter (Signed)
Pt's niece is calling regarding pt's referral to dermatology. Pt's niece states that her uncle let her know that 10 years ago he had basal cell carcinoma removed and she is wondering if this is something that should be seen as soon as possible.    Please advise.

## 2020-06-06 NOTE — Telephone Encounter (Signed)
This has not made it to my desk to schedule yet but I can put on cx list

## 2020-06-06 NOTE — Telephone Encounter (Signed)
Sending to referrals to alert them of the change

## 2020-06-10 ENCOUNTER — Ambulatory Visit: Attending: Family | Primary: Registered Nurse

## 2020-06-10 ENCOUNTER — Ambulatory Visit: Admit: 2020-06-10 | Discharge: 2020-06-10 | Payer: MEDICARE | Attending: Family | Primary: Registered Nurse

## 2020-06-10 DIAGNOSIS — E1065 Type 1 diabetes mellitus with hyperglycemia: Secondary | ICD-10-CM

## 2020-06-10 LAB — AMB POC HEMOGLOBIN A1C
Hemoglobin A1C, POC: 9.1 %
Hemoglobin A1c (POC): 9.1 %

## 2020-06-10 MED ORDER — BASAGLAR KWIKPEN U-100 INSULIN 100 UNIT/ML (3 ML) SUBCUTANEOUS
100 unit/mL (3 mL) | SUBCUTANEOUS | 11 refills | Status: DC
Start: 2020-06-10 — End: 2020-07-23

## 2020-06-10 MED ORDER — INSULIN ASPART 100 UNIT/ML (3 ML) SUB-Q PEN
100 unit/mL (3 mL) | SUBCUTANEOUS | 11 refills | Status: DC
Start: 2020-06-10 — End: 2020-09-24

## 2020-06-10 MED ORDER — BD AUTOSHIELD DUO PEN NEEDLE 30 GAUGE X 3/16"
30 gauge x 3/16" | PEN_INJECTOR | 11 refills | Status: DC
Start: 2020-06-10 — End: 2021-07-14

## 2020-06-10 NOTE — Progress Notes (Signed)
Progress Notes by Alla German, FNP at 06/10/20 1345                Author: Alla German, FNP  Service: --  Author Type: Nurse Practitioner       Filed: 06/10/20 1716  Encounter Date: 06/10/2020  Status: Signed          Editor: Alla German, FNP (Nurse Practitioner)               06/10/2020      Alejandro [name] is here for initial visit for      Chief Complaint       Patient presents with        ?  Blood sugar problem             new patient visit        Type 1 diabetes Dx 0454. Diabetes regime includes  Basaglar    U100 insulin 36 units a.m. & NovoLog U100 insulin with sliding scale. He uses 3 units for BG over 150-200 then 2 units for every 50 points to a max of 11 units per meal and bedtime.  BG levels reviewed with him today, see record.        Current status: A1C done at clinic today was elevated at 9.1.  A1C goal <7.0.  Currently all BG levels are hyperglycemic.  He denies any hypoglycemia  or hypoglycemic events requiring assistance.   He resides at assisted living at [facility].  Since he moved there from down self he finds that he is eating much better. He is physically active daily by walking and use of equipment at  Facility.   Dx dementia however he states " I remember things just fine".    Staff is very attentive to him and they are here with him at this visit today..        Doing okay during COVID-19 physical distancing restrictions.   States they have adequate food and supplies     He is uncertain whether not he had COVID 19 vaccination.      Summary/Plan of Care:   Type 1 diabetes:  After discussion today he will increase Basaglar insulin 36 units a.m. and 10 units p.m..  NovoLog will be changed to 7 units each meal plus correction formula.  Correction formula  will include 3 units for every 30 points over 150 to a maximum of 9 extra units.  Secondary to scheduling issue he agrees to meet with Trudee Kuster RN CDE for follow-up.    Updated medication list for this problem:     Basaglar U100 insulin 36 units a.m.& 10 units p.m.    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  Toenails are thickened.  He  states he checks his feet daily.  He may benefit from diabetic shoes and insoles..       Recent Labs, Reviewed. Medications, allergies, Medications, history medical/surgical/tobacco/family, reviewed.          Eye exam is due      Influenza vaccination is due      He is on ACE-inhibitor and statin  Past Medical History:        Diagnosis  Date         ?  Dementia (HCC)       ?  Diabetes (HCC)       ?  Grief            loss of wife 05-Apr-2013         ?  Hypertension            Social History          Socioeconomic History         ?  Marital status:  SINGLE              Spouse name:  Not on file         ?  Number of children:  Not on file     ?  Years of education:  Not on file     ?  Highest education level:  Not on file       Occupational History        ?  Not on file       Tobacco Use         ?  Smoking status:  Never Smoker     ?  Smokeless tobacco:  Never Used       Substance and Sexual Activity         ?  Alcohol use:  Never     ?  Drug use:  Never     ?  Sexual activity:  Not on file        Other Topics  Concern        ?  Not on file       Social History Narrative          Widowed without children. Wife died about 2013-04-05       Previous employment as a Runner, broadcasting/film/video and principal          4 years of college          Social Determinants of Health          Financial Resource Strain:         ?  Difficulty of Paying Living Expenses:        Food Insecurity:         ?  Worried About Programme researcher, broadcasting/film/video in the Last Year:      ?  Barista in the Last Year:        Transportation Needs:         ?  Freight forwarder (Medical):      ?  Lack of Transportation (Non-Medical):        Physical  Activity:         ?  Days of Exercise per Week:      ?  Minutes of Exercise per Session:        Stress:         ?  Feeling of Stress :        Social Connections:         ?  Frequency of Communication with Friends and Family:      ?  Frequency of Social Gatherings with Friends and Family:      ?  Attends Religious Services:      ?  Active Member of Clubs or Organizations:      ?  Attends Banker Meetings:      ?  Marital Status:        Intimate Partner Violence:         ?  Fear of Current or Ex-Partner:      ?  Emotionally Abused:      ?  Physically Abused:         ?  Sexually Abused:         History reviewed. No pertinent surgical history.   History reviewed. No pertinent family history.   Diabetic Standards reviewed today:   DM provider Wynona Luna FNP     Lab Results         Component  Value  Date/Time            Hemoglobin A1c (POC)  9.1  06/10/2020 02:00 PM        No results found for: LDL, LDLC, DLDLP   No results found for: MCACR, MCA1, MCA2, MCA3, MCAU, MCAU2, MCALPOCT     Diabetic Foot and Eye Exam HM Status        Topic  Date Due         ?  Eye Exam   Never done         ?  Diabetic Foot Care   06/10/2021          Diabetic Foot and Eye Exam HM Status        Topic  Date Due         ?  Eye Exam   Never done         ?  Diabetic Foot Care   06/10/2021           There is no immunization history on file for this patient.      3 most recent PHQ Screens  02/01/2020        Little interest or pleasure in doing things  Not at all     Feeling down, depressed, irritable, or hopeless  Not at all        Total Score PHQ 2  0           ROS   Constitutional: Feeling well overall   Eyes: Denies vision changes    Respiratory: Negative for shortness of breath.     Cardiovascular: Negative for chest pain, palpitations, claudication and leg swelling.    Gastrointestinal: Negative for abdominal pain, constipation, diarrhea, nausea and vomiting. Eats 3 meals /day.  Foods are prepared by staff.     Genitourinary:  Denies nocturia, yeast infections, UTIs    Neurological:Peripheral neuropathy.  No weakness    Endo/Heme/Allergies:  Denies hypoglycemia unawareness.  Verbalizes to treat low blood sugars with simple sugar then follows with food    Psychiatric/Behavioral: Negative for depression. The patient is not nervous/anxious.             Visit Vitals      BP  127/65 (BP 1 Location: Left arm, BP Patient Position: Sitting, BP Cuff Size: Adult)     Pulse  61     Ht  5\' 11"  (1.803 m)     Wt  168 lb (76.2 kg)        BMI  23.43 kg/m??          Allergies        Allergen  Reactions         ?  Shellfish Derived  Anaphylaxis                    ICD-10-CM  ICD-9-CM  1.  Type 1 diabetes mellitus with hyperglycemia, with long-term current use of insulin (HCC)   E10.65  250.01             790.29          2.  Type 2 diabetes mellitus treated with insulin (HCC)   E11.9  250.00           Z79.4  V58.67          Current Outpatient Medications          Medication  Sig  Dispense  Refill           ?  insulin glargine (Basaglar KwikPen U-100 Insulin) 100 unit/mL (3 mL) inpn  Inject 36 units Am and 10 units PM subq E10.65  Indications: type 1 diabetes mellitus  15 mL  11     ?  insulin aspart U-100 (NOVOLOG) 100 unit/mL (3 mL) inpn  Inject 7 units breakfast, lunch, supper plus correction (3 units for every 30 points over 150 to max of 9 extra units)  15 mL  11     ?  pen needle,diabetic dual safty (BD AutoShield Duo Pen Needle) 30 gauge x 3/16" ndle  For use with insulin pens 5 times daily  200 Pen Needle  11     ?  levothyroxine (SYNTHROID) 50 mcg tablet  Take 1 Tablet by mouth Daily (before breakfast).  90 Tablet  0           ?  pantoprazole (PROTONIX) 40 mg tablet  Take 1 Tablet by mouth daily.  90 Tablet  0           ?  tamsulosin (Flomax) 0.4 mg capsule  Take 1 Capsule by mouth daily.  90 Capsule  0     ?  multivitamin (ONE A DAY) tablet  Take 1 Tablet by mouth daily.  90 Tablet  3     ?  OTHER  Monitor Blood Glucose 4 times daily, before  meals and at 8PM  1 Each  0     ?  QUEtiapine (SEROquel) 50 mg tablet  Take 1 Tablet by mouth two (2) times a day.  180 Tablet  0     ?  lancets misc  Use one lancet to test blood glucose 4 times daily  100 Each  3     ?  glucose blood VI test strips (Prodigy No Coding) strip  Use one test strip to test blood glucose 4 times daily  100 Strip  3     ?  Insulin Syringe-Needle U-100 0.3 mL 30 syrg  1 Each by SubCUTAneous route four (4) times daily.  100 Syringe  3     ?  apixaban (Eliquis) 2.5 mg tablet  Take 1 Tablet by mouth two (2) times a day.  180 Tablet  0     ?  rosuvastatin (CRESTOR) 10 mg tablet  Take 1 Tablet by mouth nightly.  90 Tablet  0     ?  amLODIPine (NORVASC) 10 mg tablet  Take 1 Tab by mouth daily. Indications: high blood pressure  90 Tab  3     ?  acetaminophen (TYLENOL) 325 mg tablet  Take 325 mg by mouth every four (4) hours as needed for Pain.         ?  magnesium citrate solution  Take 296 mL by mouth now. Prn if no results after enema. If no results in 1 hour of completion of  bowl protocol, call MD immediately for further orders.          ?  magnesium hydroxide (Phillips Milk of Magnesia) 400 mg/5 mL suspension  Take 30 mL by mouth daily as needed for Constipation. Prn if no BM in 3 days         ?  benazepriL (LOTENSIN) 5 mg tablet  Take 5 mg by mouth two (2) times a day.         ?  cetirizine (ZYRTEC) 10 mg tablet  Take 10 mg by mouth daily.         ?  donepeziL (ARICEPT) 5 mg tablet  Take  by mouth nightly.               ?  glucagon (GlucaGen HypoKit) 1 mg injection  1 mg by IntraVENous route once.              Medications Discontinued During This Encounter        Medication  Reason         ?  insulin aspart U-100 (NOVOLOG) 100 unit/mL injection  DUPLICATE ORDER     ?  pen needle,diabetic dual safty (BD AutoShield Duo Pen Needle) 30 gauge x 3/16" ndle  REORDER         ?  insulin glargine (Basaglar KwikPen U-100 Insulin) 100 unit/mL (3 mL) inpn  REORDER              Physical Exam    Constitutional: oriented to person, place, and time. Appears well-developed and well-nourished.    Eyes: Conjunctivae and EOM are normal.   Neck: No thyromegaly present. No carotid bruit    Cardiovascular: Normal rate, regular rhythm and normal heart sounds. No murmur heard.   Pulmonary/Chest: Effort normal and breath sounds normal.    Musculoskeletal: Exhibits no edema. Gait is normal   Lymphadenopathy: No cervical adenopathy.   Neurological: Alert and oriented to person, place, and time.    Skin: No rashes or wounds   Psychiatric: Normal mood and affect. Behavior is normal.            Foot Exam (with socks and/or shoes not present):         Sensory-Monofilament:            Left foot:  Absent to upper thigh            Right foot:  Absent to upper thigh         Sensory-Vibratory:  Positive loss of vibratory sensation bilateral great toes and ankles         Inspection:            Left foot:  Hammertoe malformation            Right foot:  Hammertoe malformation         Nails:            Left foot:  Thickened            Right foot:  Thickened        Pedal pulses:  2+ bilaterally      Diabetes Self Management Plan:   Monitor blood glucose levels 4 times per day   Hypoglycemia/insulin protocol/treat low glucose with simple sugar then follow with food   Monitor portions carefully/plate method of portion control/carb counting   Stay well hydrated by drinking at least 6-8 glasses water per day    Exercise daily/walking   Check foot daily and wear shoe at  all times   Monitor blood glucose levels before driving and must be over 161120   Please bring blood glucose meter and updated medication list to each visit. Thanks        Future Appointments           Date  Time  Provider  Department  Center           08/12/2020   1:30 PM  Mahala Menghiniowns, Lori A, RN  [facility]           09/11/2020  10:00 AM  Cramm, Renae FicklePaul, FNP  [facility] MED           50% of visit spent counseling concerning diabetes and coordination of care   Visit: 1:48 PM-2:49  p.m.   Next Appointment:  [phone] at 1:30 p.m. with Danford BadLori DownsRN CDE      Electronically signed by Alla GermanSheryl A Allessandra Bernardi, FNP 06/10/20

## 2020-06-25 NOTE — Telephone Encounter (Signed)
Called wendy back to relay information about the patient, Physical therapist is requesting some more information about the patient along with a current med list.    Signed By: Candi Leash     June 25, 2020

## 2020-06-25 NOTE — Telephone Encounter (Signed)
[name] from Alejandro [name] is requesting some clinical background on [name]. [name] was seen today and the provider feels like she is missing some information regarding this [name].  Gilford Silvius Fax: [phone]  Please advise  [phone]

## 2020-07-03 MED ORDER — ROSUVASTATIN 10 MG TAB
10 mg | ORAL_TABLET | Freq: Every evening | ORAL | 0 refills | Status: DC
Start: 2020-07-03 — End: 2020-10-11

## 2020-07-03 NOTE — Telephone Encounter (Signed)
Requested Prescriptions     Signed Prescriptions Disp Refills   . rosuvastatin (CRESTOR) 10 mg tablet 90 Tablet 0     Sig: Take 1 Tablet by mouth nightly.     Authorizing Provider: Mellody Drown     Ordering User: Candi Leash     Call back needed: no  Medication(s): see above  Quantity: 90                                         Pharmacy:  [facility]  Prescriber:   Cramm  Last appt @ PCP Office:  Future Appointments   Date Time Provider Department Center   08/12/2020  1:30 PM Mahala Menghini, RN [facility]   09/11/2020 10:00 AM Ramon Dredge, FNP [facility]   10/14/2020  2:20 PM Ruby Cola, MD [facility]       MOST RECENT BLOOD PRESSURES  BP Readings from Last 3 Encounters:   06/10/20 127/65   06/03/20 132/60   03/11/20 (!) 145/80        MOST RECENT LAB DATA  Hemoglobin A1c (POC)   Date Value Ref Range Status   06/10/2020 9.1 % Final

## 2020-07-03 NOTE — Telephone Encounter (Signed)
 Alejandro [name]  1941/07/10      Call back needed: no    Preferred call back number: Call preference: Cell phone   [phone] (home)    No relevant phone numbers on file.        Medications Requested:  Requested Prescriptions     Pending Prescriptions Disp Refills   . rosuvastatin  (CRESTOR ) 10 mg tablet 90 Tablet 0     Sig: Take 1 Tablet by mouth nightly.   pt is currently out of this          Preferred Pharmacy:   [facility] - [place], [place] - [address]  [address]  [place] [address]  Phone: [phone] Fax: [phone]                                       Last visit with provider:  06/03/2020    Future Appointments   Date Time Provider Department Center   08/12/2020  1:30 PM Bethel Katheryn LABOR, RN [facility] END[facility]   09/11/2020 10:00 AM Jacqualin Mt, FNP [facility]   10/14/2020  2:20 PM Rinda Lenis, MD [facility]       MOST RECENT BLOOD PRESSURES  BP Readings from Last 3 Encounters:   06/10/20 127/65   06/03/20 132/60   03/11/20 (!) 145/80        MOST RECENT LAB DATA  Lab Results   Component Value Date/Time    Hemoglobin A1c (POC) 9.1 06/10/2020 02:00 PM

## 2020-07-08 NOTE — Telephone Encounter (Signed)
Noted.

## 2020-07-08 NOTE — Telephone Encounter (Signed)
Pt has been complaining about upper back pain. This has been going on and off for a couple weeks. Pt has been asking them to get him scheduled with pcp, they are hoping to get him in to be seen as soon as possible.    [phone]

## 2020-07-08 NOTE — Telephone Encounter (Signed)
Called [facility] back to schedule patient for an appointment and get more information on the back pain. Maralyn Sago said that he has been complaining of upper back pain frequently, his family was contacted and they stated he used to get injections in his back due to arthritis.     I have made the patient an appointment to be evaluated for Sept. 7th @ 11AM.    Signed By: Candi Leash     July 08, 2020

## 2020-07-15 ENCOUNTER — Ambulatory Visit: Attending: Registered Nurse | Primary: Registered Nurse

## 2020-07-15 ENCOUNTER — Other Ambulatory Visit: Admit: 2020-07-15 | Discharge: 2020-07-15 | Payer: PRIVATE HEALTH INSURANCE | Primary: Registered Nurse

## 2020-07-15 ENCOUNTER — Inpatient Hospital Stay: Admit: 2020-07-15 | Payer: MEDICARE | Primary: Registered Nurse

## 2020-07-15 ENCOUNTER — Ambulatory Visit: Admit: 2020-07-15 | Discharge: 2020-07-17 | Payer: MEDICARE | Attending: Registered Nurse | Primary: Registered Nurse

## 2020-07-15 DIAGNOSIS — E1065 Type 1 diabetes mellitus with hyperglycemia: Secondary | ICD-10-CM

## 2020-07-15 DIAGNOSIS — M6281 Muscle weakness (generalized): Secondary | ICD-10-CM

## 2020-07-15 DIAGNOSIS — Z1159 Encounter for screening for other viral diseases: Secondary | ICD-10-CM

## 2020-07-15 NOTE — Progress Notes (Signed)
[facility]   [address]  [place] [address]  [phone]  Alejandro [name]  [birth year]      Impression/Plan: Will see patient in 1 month for followup, insulin dependent DM, generalized weakness, falls, dementia.    **Will send patient to COVID clinic for COVID testing for symptoms, rhinorrhea, weakness, shortness of breath.    Chronic Conditions Addressed Today     1. 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         2. Generalized muscle weakness      Sent patient to COVID clinic for testing.    Other Labs ordered; UA reflex, CMP, CBC with diff, TSH, vitamin B12, ferritin, iron panel, magnesium, hemoglobin A1c.          Relevant Orders     METABOLIC PANEL, COMPREHENSIVE     CBC WITH AUTOMATED DIFF     URINE MICROSCOPIC WITH REFLEX CULTURE     MAGNESIUM     TSH CASCADE     FERRITIN     IRON PROFILE     VITAMIN B12     SARS-COV-2 (Completed)    3. Type 1 diabetes mellitus with hyperglycemia, with long-term current use of insulin (HCC) - Primary      - as patient is typically in the 100s to 200s per [facility] report from [facility] will not increase insulin at this time. High blood glucose levels the past 2 days likely due to excess calories or illness.          Relevant Orders     HEMOGLOBIN A1C WITH EAG    4. Rhinorrhea      Sent patient to COVID clinic for testing.           5. Shortness of breath      Will send patient to COVID clinic for COVID testing.         6. Multiple falls      Continue with in home physical therapy  Patient watched as closely as possible by nursing home staff  As patient does not wish to use a cane or walker will not press at this time to avoid upsetting patient.                Chief Compliant: Alejandro [name] presents today for Insulin dependent DM, dementia.      Chief Complaint   Patient presents with   ??? Thoracic Back Pain         HPI:  79 year old gentleman here  for presents today for Insulin dependent DM, dementia. He is here with his caregiver Alejandro [name]. Patient is a resident at [facility] on their Dementia Unit.    Dementia  Patient states he fell a few weeks ago hitting his right knee - Alejandro [name] confirms same. Reports he likes to walk, however has not been able to do much walking because he feels too weak and tired, he tells me he spends his days watching television, he does not recall which tv shows he watches - Alejandro [name] confirms same.    Insulin Dependent DM  Patient tends to eat the foods he likes, does not follow a diabetic-cardiac diet.  Blood glucose levels in the 100s-200s most days, glucose levels were high for past 2 days 300s to greater than 500- Alejandro [name] states they had a party yesterday with lots of food, Barbeque.    Falls  Patient does not use a cane or walker. Patient walked from parking lot to examination room at the end of this buiding- Alejandro [name] confirmed same.    Generalized weakness  Has been weaker over the past few days per Alejandro [name].    Shortness of breath  Patient reports he is short of breath at times. He is unable to do much walking. Patient reports he typically walks a lot.    Rhonorrhea  Alejandro [name] reports this is new today. Patient and Alejandro [name] report nasal discharge is clear and watery.        Current Outpatient Medications   Medication Instructions   ??? acetaminophen (TYLENOL) 325 mg, Oral, EVERY 4 HOURS AS NEEDED   ??? amLODIPine (NORVASC) 10 mg, Oral, DAILY   ??? apixaban (ELIQUIS) 2.5 mg, Oral, 2 TIMES DAILY   ??? benazepriL (LOTENSIN) 5 mg, Oral, 2 TIMES DAILY   ??? cetirizine (ZYRTEC) 10 mg, Oral, DAILY   ??? donepeziL (ARICEPT) 5 mg tablet Oral, EVERY BEDTIME   ??? glucagon 1 mg, IntraVENous, ONCE   ??? glucose blood VI test strips (Prodigy No Coding) strip Use one test strip to test blood glucose 4 times daily   ??? insulin aspart U-100 (NOVOLOG) 100 unit/mL (3 mL) inpn Inject 7 units breakfast, lunch, supper plus correction (3 units for every 30 points over 150  to max of 9 extra units)   ??? insulin glargine (Basaglar KwikPen U-100 Insulin) 100 unit/mL (3 mL) inpn Inject 36 units Am and 10 units PM subq E10.65   ??? Insulin Syringe-Needle U-100 0.3 mL 30 syrg 1 Each, SubCUTAneous, 4 TIMES DAILY   ??? lancets misc Use one lancet to test blood glucose 4 times daily   ??? levothyroxine (SYNTHROID) 50 mcg, Oral, DAILY BEFORE BREAKFAST   ??? magnesium citrate solution 296 mL, Oral, NOW, Prn if no results after enema. If no results in 1 hour of completion of bowl protocol, call MD immediately for further orders.    ??? magnesium hydroxide (Phillips Milk of Magnesia) 400 mg/5 mL suspension 30 mL, Oral, DAILY PRN, Prn if no BM in 3 days   ??? multivitamin (ONE A DAY) tablet 1 Tablet, Oral, DAILY   ??? OTHER Monitor Blood Glucose 4 times daily, before meals and at 8PM   ??? pantoprazole (PROTONIX) 40 mg, Oral, DAILY   ??? pen needle,diabetic dual safty (BD AutoShield Duo Pen Needle) 30 gauge x 3/16" ndle For use with insulin pens 5 times daily   ??? QUEtiapine (SEROQUEL) 50 mg, Oral, 2 TIMES DAILY   ??? rosuvastatin (CRESTOR) 10 mg, Oral, EVERY BEDTIME   ??? tamsulosin (FLOMAX) 0.4 mg, Oral, DAILY        Allergies   Allergen Reactions   ??? Shellfish Derived Anaphylaxis        Review of Systems   Constitutional: Positive for malaise/fatigue.   HENT: Positive for congestion.         Runny nose since earlier this morning per Alejandro [name] with clear watery discharge; patient reports same   Respiratory: Positive for shortness of breath.         COVID testing Sunday 07/15/20 negative  Today patient has runny nose, some shortness of breath, weakness, lack of strength to legs.   Cardiovascular: Negative for chest pain.   Gastrointestinal:        No stool incontinence per Alejandro [name]   Genitourinary:        Incontinence at least once a weak, per Alejandro [name]   Neurological: Positive for weakness.  Weak legs, able to walk from parking lot into buiding and to examination room.  Not using walker or cane.   Psychiatric/Behavioral:  Positive for memory loss.        Vitals:    07/15/20 1113   BP: 118/62   Pulse: 80   Weight: 165 lb 12.8 oz (75.2 kg)   Height: 5\' 11"  (1.803 m)       Physical Exam  Constitutional:       General: He is not in acute distress.     Appearance: He is ill-appearing. He is not toxic-appearing or diaphoretic.   HENT:      Head: Normocephalic and atraumatic.      Nose: Congestion and rhinorrhea present.      Comments: Clear, watery  Eyes:      General: No scleral icterus.        Right eye: No discharge.         Left eye: No discharge.      Conjunctiva/sclera: Conjunctivae normal.   Cardiovascular:      Rate and Rhythm: Normal rate.      Heart sounds: No murmur heard.   No friction rub. No gallop.       Comments: Paced rhythm  Pulmonary:      Effort: Pulmonary effort is normal. No respiratory distress.      Breath sounds: Normal breath sounds. No stridor. No wheezing, rhonchi or rales.   Chest:      Chest wall: No tenderness.   Musculoskeletal:      Cervical back: Normal range of motion.   Skin:     General: Skin is warm and dry.      Coloration: Skin is pale.   Neurological:      Mental Status: He is alert. Mental status is at baseline. He is disoriented.      Motor: Weakness present.      Comments: Dementia, pleasantly confused   Psychiatric:      Comments: Dementia at baseline compared to previous visits. Patient with pleasant affect.        36 minutes spent on this office visit reviewing medical records, documenting, interviewing assessing, planning, educating.  I reviewed and the patient's Past Medical, Surgical, Family and Social History in addition to their Problems, Meds and Allergies.  Recent results reviewed  Any problems listed in the Assessment and Plan were assessed during today's visit and if not explicitly discussed are stable based on history, physical exam, review of pertinent labs, studies and medications.         Follow-up and Dispositions    ?? Return for f/u  in 1 month  for followup, insulin dependent  DM, dementia, falls, generalized weakness..       Future Appointments   Date Time Provider Department Center   08/12/2020  1:30 PM 10/12/2020, RN [facility]   09/11/2020 10:00 AM 13/02/2020, FNP [facility]   10/14/2020  2:20 PM 14/05/2020, MD Ruby Cola INT MED       Patient was seen by Purcell Mouton, FNP and  Ramon Dredge, FNP  07/15/2020      [facility] INTERNAL MEDICINE   [address] [place]  [phone]

## 2020-07-15 NOTE — Assessment & Plan Note (Signed)
Will send patient to COVID clinic for COVID testing.

## 2020-07-15 NOTE — Assessment & Plan Note (Signed)
Continue with in home physical therapy  Patient watched as closely as possible by nursing home staff  As patient does not wish to use a cane or walker will not press at this time to avoid upsetting patient.

## 2020-07-15 NOTE — Assessment & Plan Note (Signed)
Sent patient to COVID clinic for testing.

## 2020-07-15 NOTE — Assessment & Plan Note (Signed)
Continue Aricept and Seroquel is no longer having aggressive outbursts.

## 2020-07-15 NOTE — Assessment & Plan Note (Signed)
Sent patient to COVID clinic for testing.    Other Labs ordered; UA reflex, CMP, CBC with diff, TSH, vitamin B12, ferritin, iron panel, magnesium, hemoglobin A1c.

## 2020-07-15 NOTE — Assessment & Plan Note (Signed)
-   as patient is typically in the 100s to 200s per [facility] report from [facility] will not increase insulin at this time. High blood glucose levels the past 2 days likely due to excess calories or illness.

## 2020-07-15 NOTE — Progress Notes (Signed)
Patient was swabbed for COVID today

## 2020-07-17 LAB — SARS-COV-2
SARS-CoV-2: NOT DETECTED
SARS-CoV-2: NOT DETECTED

## 2020-07-17 NOTE — Telephone Encounter (Signed)
Alejandro [name] is aware.    Signed By: Candi Leash     July 17, 2020

## 2020-07-17 NOTE — Telephone Encounter (Signed)
Not sure if [facility] knows his COVID testing was negative. Please let them know. Thanks

## 2020-07-21 MED ORDER — ELIQUIS 2.5 MG TABLET
2.5 mg | ORAL_TABLET | ORAL | 11 refills | Status: AC
Start: 2020-07-21 — End: ?

## 2020-07-22 LAB — DIFFERENTIAL, AUTO.
ABS. BASOPHILS: 0.08 10*3/uL (ref 0.00–0.20)
ABS. IMM. GRANS.: 0.02 10*3/uL (ref 0.00–0.30)
ABS. MONOCYTES: 0.48 10*3/uL (ref 0.10–0.80)
ABS. NEUTROPHILS: 4.33 10*3/uL (ref 1.90–7.80)
Abs Lymphocytes: 1.86 10*3/uL (ref 1.00–4.50)
BASOPHILS: 1.1 %
BRCH EOSINS: 9.4 %
BRCH EOSINS: 9.4 %
BRCH NEUTROPHIL: 57.9 %
Basophils %: 1.1 %
Basophils Absolute: 0.08 10*3/uL (ref 0.00–0.20)
Eos abs-DIF: 0.7 10*3/uL — ABNORMAL HIGH (ref 0.00–0.50)
Eos abs-DIF: 0.7 10*3/uL — ABNORMAL HIGH (ref 0.00–0.50)
Granulocyte Absolute Count: 0.02 10*3/uL (ref 0.00–0.30)
IMMATURE GRANULOCYTES: 0.3 %
Immature Granulocytes: 0.3 %
LYMPHOCYTES: 24.9 %
Lymphocytes %: 24.9 %
Lymphocytes Absolute: 1.86 10*3/uL (ref 1.00–4.50)
MONOCYTES %, TEST14: 6.4 %
MONOCYTES: 6.4 %
Monocytes Absolute: 0.48 10*3/uL (ref 0.10–0.80)
NRBC: 0 %
Neutrophil Count, Fluid: 57.9 %
Neutrophils Absolute: 4.33 10*3/uL (ref 1.90–7.80)
Nucleated RBCs: 0 %

## 2020-07-22 LAB — METABOLIC PANEL, COMPREHENSIVE
ALT (SGPT): 53 IU/L — ABNORMAL HIGH (ref 0–41)
AST (SGOT): 62 IU/L — ABNORMAL HIGH (ref 0–40)
Albumin: 4.6 g/dL (ref 3.5–5.2)
Alk. phosphatase: 152 IU/L — ABNORMAL HIGH (ref 40–129)
Anion gap: 19 mEq/L — ABNORMAL HIGH (ref 3–16)
BUN: 20 mg/dL (ref 8–23)
Bilirubin, total: 0.7 mg/dL (ref 0.0–1.0)
CO2: 21 mEq/L — ABNORMAL LOW (ref 22–32)
Calcium: 10 mg/dL (ref 8.8–10.3)
Chloride: 97 mEq/L — ABNORMAL LOW (ref 98–107)
Creatinine: 1 mg/dL (ref 0.70–1.30)
Glucose: 459 mg/dL — CR (ref 70–99)
Potassium: 4.7 mEq/L (ref 3.5–5.0)
Protein, total: 7.5 g/dL (ref 6.1–7.9)
Sodium: 137 mEq/L (ref 136–145)
eGFR (CKD-EPI): 72 (ref >=60)

## 2020-07-22 LAB — HEMOGLOBIN A1C WITH EAG
Est. average glucose: 200 mg/dL
Hemoglobin A1c: 8.6 % — ABNORMAL HIGH (ref 4.8–5.6)

## 2020-07-22 LAB — CBC WITH AUTOMATED DIFF
HCT: 41.9 % — ABNORMAL LOW (ref 42.0–52.0)
HGB: 14.6 g/dL (ref 14.0–18.0)
MCH: 31.7 pg (ref 28.0–34.0)
MCHC: 34.8 g/dL (ref 32.0–36.0)
MCV: 90.9 fL (ref 80.0–100.0)
MEAN PLATELET VOLUME: 11.7 fL (ref 8.5–12.0)
PLATELET: 141 10*3/uL — ABNORMAL LOW (ref 150–400)
RBC: 4.61 (ref 4.50–6.00)
RDW-CV: 11.8 % (ref 11.5–13.6)
RDW-SD: 38.9 fL (ref 35.0–47.0)
WBC: 7.5 10*3/uL (ref 4.7–10.8)

## 2020-07-22 LAB — IRON PROFILE
Iron: 89 ug/dL (ref 45–182)
TIBC: 328 ug/dL (ref 261–478)
Transferrin saturation: 27 % (ref 20–50)

## 2020-07-22 LAB — VITAMIN B12
Vitamin B-12: 735 pg/mL (ref 200–900)
Vitamin B12: 735 pg/mL (ref 200–900)

## 2020-07-22 LAB — TSH REFLEX TO T4F
TSH: 3.17 mIU/L (ref 0.40–3.80)
TSH: 3.17 mIU/L (ref 0.40–3.80)

## 2020-07-22 LAB — MAGNESIUM
Magnesium: 1.9 mg/dL (ref 1.7–2.4)
Magnesium: 1.9 mg/dL (ref 1.7–2.4)

## 2020-07-22 LAB — CBC WITH AUTO DIFFERENTIAL
Hematocrit: 41.9 % — ABNORMAL LOW (ref 42.0–52.0)
Hemoglobin: 14.6 g/dL (ref 14.0–18.0)
MCH: 31.7 pg (ref 28.0–34.0)
MCHC: 34.8 g/dL (ref 32.0–36.0)
MCV: 90.9 fL (ref 80.0–100.0)
MPV: 11.7 fL (ref 8.5–12.0)
Platelets: 141 10*3/uL — ABNORMAL LOW (ref 150–400)
RBC: 4.61 NA (ref 4.50–6.00)
RDW-CV,2213: 11.8 % (ref 11.5–13.6)
RDW-SD: 38.9 fL (ref 35.0–47.0)
WBC: 7.5 10*3/uL (ref 4.7–10.8)

## 2020-07-22 LAB — COMPREHENSIVE METABOLIC PANEL
ALT: 53 IU/L — ABNORMAL HIGH (ref 0–41)
AST: 62 IU/L — ABNORMAL HIGH (ref 0–40)
Albumin: 4.6 g/dL (ref 3.5–5.2)
Alkaline Phosphatase: 152 IU/L — ABNORMAL HIGH (ref 40–129)
Anion Gap: 19 mEq/L — ABNORMAL HIGH (ref 3–16)
BUN: 20 mg/dL (ref 8–23)
CO2: 21 mEq/L — ABNORMAL LOW (ref 22–32)
Calcium: 10 mg/dL (ref 8.8–10.3)
Chloride: 97 mEq/L — ABNORMAL LOW (ref 98–107)
Creatinine: 1 mg/dL (ref 0.70–1.30)
EGFR (CKD-EPI): 72 NA (ref >=60)
Glucose: 459 mg/dL — CR (ref 70–99)
Potassium: 4.7 mEq/L (ref 3.5–5.0)
Sodium: 137 mEq/L (ref 136–145)
Total Bilirubin: 0.7 mg/dL (ref 0.0–1.0)
Total Protein: 7.5 g/dL (ref 6.1–7.9)

## 2020-07-22 LAB — HEMOGLOBIN A1C W/EAG
Hemoglobin A1C: 8.6 % — ABNORMAL HIGH (ref 4.8–5.6)
eAG: 200 mg/dL

## 2020-07-22 LAB — IRON AND TIBC
IRON, IRON: 89 ug/dL (ref 45–182)
TIBC: 328 ug/dL (ref 261–478)
TRANSFERRIN SATURATION, TSAT: 27 % (ref 20–50)

## 2020-07-23 ENCOUNTER — Telehealth

## 2020-07-23 MED ORDER — BASAGLAR KWIKPEN U-100 INSULIN 100 UNIT/ML (3 ML) SUBCUTANEOUS
100 unit/mL (3 mL) | SUBCUTANEOUS | 11 refills | Status: DC
Start: 2020-07-23 — End: 2020-08-27

## 2020-07-23 NOTE — Telephone Encounter (Signed)
Called and discussed with him.  He did not feel well yesterday states he was "loogy" for about 4 hours. He did not eat supper.  COVID check was negative.  He is feeling better today.  They will continue to monitor him.  He does like sweets which may be contributory to hyperglycemia also.  Discussed with them his sweets need to be sugar free and they said this is not a problem.  Instructed to increase Lantus insulin 38 units a.m. and continue p.m. dose of 10 units.

## 2020-07-23 NOTE — Telephone Encounter (Signed)
Alejandro [name] called in from [facility] and she was told to reach out to [place] about his blood sugars.  Today when they took it at lunch it was 468.    You can reach out to the patient at [phone]

## 2020-07-24 LAB — CULTURE, URINE
FINAL REPORT, FRPT: NO GROWTH
Final report: NO GROWTH

## 2020-07-28 MED ORDER — PRODIGY NO CODING STRIPS
ORAL_STRIP | 11 refills | Status: AC
Start: 2020-07-28 — End: ?

## 2020-07-28 NOTE — Telephone Encounter (Signed)
rella called from [facility] where the patient resides-     States that the meter is just high-   Numbers   Today   8 am-401  8PM 9/19- 471, 369    No diet changes since the insulin increase- the patient states that he feels week and that his legs are bothering him. Rella states that he is walking slower than usual.   You may contact real at [phone]

## 2020-07-28 NOTE — Telephone Encounter (Signed)
Please advise

## 2020-07-28 NOTE — Telephone Encounter (Signed)
Reviewed.   Readjustment of insulins done 07/23/2020 by FNP Sheryl Sparlin.

## 2020-07-29 ENCOUNTER — Inpatient Hospital Stay
Admit: 2020-07-29 | Discharge: 2020-07-30 | Disposition: A | Discharge status: Home / Self Care | Payer: MEDICARE | Attending: Hospitalist | Admitting: Hospitalist

## 2020-07-29 ENCOUNTER — Emergency Department: Admit: 2020-07-29 | Payer: MEDICARE | Primary: Registered Nurse

## 2020-07-29 DIAGNOSIS — E1110 Type 2 diabetes mellitus with ketoacidosis without coma: Secondary | ICD-10-CM

## 2020-07-29 LAB — UA WITH REFLEX MICRO AND CULTURE
Glucose: >=1000 mg/dL — AB
Ketone: >=80 mg/dL — AB
Leukocyte Esterase: NEGATIVE
Nitrites: NEGATIVE
Protein: 30 mg/dL — AB
Specific gravity: 1.025 (ref 1.005–1.030)
Urobilinogen: 0.2 EU/dL (ref 0.1–1.0)
pH (UA): 5.5 (ref 5.0–9.0)

## 2020-07-29 LAB — METABOLIC PANEL, BASIC
Anion gap: 20 mmol/L
BUN/Creatinine ratio: 25
BUN: 31 MG/DL — ABNORMAL HIGH (ref 7–20)
CO2: 17 mmol/L — ABNORMAL LOW (ref 20–32)
Calcium: 9.2 MG/DL (ref 8.8–10.5)
Chloride: 105 mmol/L (ref 100–110)
Creatinine: 1.23 MG/DL — ABNORMAL HIGH (ref 0.40–1.20)
GFR est AA: >60 mL/min/{1.73_m2} (ref >60)
GFR est non-AA: >60 mL/min/{1.73_m2} (ref >60)
Glucose: 283 mg/dL — ABNORMAL HIGH (ref 75–110)
Potassium: 4 mmol/L (ref 3.5–5.0)
Sodium: 138 mmol/L (ref 135–145)

## 2020-07-29 LAB — BLOOD GAS, VENOUS
CO2 Content, Ven: 17 mmol/L
CO2, VENOUS: 17 mmol/L
HCO3, Venous: 16 mmol/L — ABNORMAL LOW (ref 22–27)
O2 Sat, Ven: 90 % — ABNORMAL LOW (ref 95–98)
PO2, Ven: 58 mmHg — ABNORMAL HIGH (ref 35–55)
VENOUS BASE DEFICIT: >3 mmol/L — ABNORMAL HIGH (ref 0.0–2.0)
VENOUS BICARBONATE: 16 mmol/L — ABNORMAL LOW (ref 22–27)
VENOUS O2 SATURATION: 90 % — ABNORMAL LOW (ref 95–98)
VENOUS PCO2: 28.7 mmHg — ABNORMAL LOW (ref 40–60)
VENOUS PH: 7.37 (ref 7.34–7.45)
VENOUS PO2: 58 mmHg — ABNORMAL HIGH (ref 35–55)
Venous Base Deficit: >3 mmol/L — ABNORMAL HIGH (ref 0.0–2.0)
pCO2, Ven: 28.7 mmHg — ABNORMAL LOW (ref 40–60)
pH, Ven: 7.37 NA (ref 7.34–7.45)

## 2020-07-29 LAB — METABOLIC PANEL, COMPREHENSIVE
A-G Ratio: 1
ALT (SGPT): 38 U/L — ABNORMAL HIGH (ref 3–35)
AST (SGOT): 34 U/L (ref 15–40)
Albumin: 4.4 g/dL (ref 3.5–5.0)
Alk. phosphatase: 85 U/L (ref 35–100)
Anion gap: 25 mmol/L
BUN/Creatinine ratio: 23
BUN: 33 MG/DL — ABNORMAL HIGH (ref 7–20)
Bilirubin, total: 1.3 mg/dL — ABNORMAL HIGH (ref 0.10–1.20)
CO2: 17 mmol/L — ABNORMAL LOW (ref 20–32)
Calcium: 9.7 MG/DL (ref 8.8–10.5)
Chloride: 99 mmol/L — ABNORMAL LOW (ref 100–110)
Creatinine: 1.46 MG/DL — ABNORMAL HIGH (ref 0.40–1.20)
GFR est AA: 57 mL/min/{1.73_m2} — ABNORMAL LOW (ref >60)
GFR est non-AA: 50 mL/min/{1.73_m2} — ABNORMAL LOW (ref >60)
Globulin: 4.2 g/dL
Glucose: 405 mg/dL — ABNORMAL HIGH (ref 75–110)
Potassium: 4.6 mmol/L (ref 3.5–5.0)
Protein, total: 8.6 g/dL — ABNORMAL HIGH (ref 6.2–8.0)
Sodium: 136 mmol/L (ref 135–145)

## 2020-07-29 LAB — CBC WITH AUTOMATED DIFF
ABS. BASOPHILS: 0 10*3/uL (ref 0.0–0.2)
ABS. EOSINOPHILS: 0 10*3/uL (ref 0.0–0.5)
ABS. IMM. GRANS.: 0 10*3/uL (ref 0.0–0.1)
ABS. LYMPHOCYTES: 0.8 10*3/uL — ABNORMAL LOW (ref 1.0–4.5)
ABS. MONOCYTES: 0.5 10*3/uL (ref 0.1–0.8)
ABS. NEUTROPHILS: 5.7 10*3/uL (ref 1.9–7.8)
ABSOLUTE NRBC: 0 10*3/uL
BASOPHILS: 0 %
EOSINOPHILS: 0 %
HCT: 40.1 % — ABNORMAL LOW (ref 42.0–52.0)
HGB: 13.6 g/dL — ABNORMAL LOW (ref 14.0–18.0)
IMMATURE GRANULOCYTES: 0 %
LYMPHOCYTES: 11 %
MCH: 31.1 PG (ref 28.0–34.0)
MCHC: 33.9 g/dL (ref 32.0–36.0)
MCV: 91.6 FL (ref 80.0–100.0)
MONOCYTES: 6 %
MPV: 10.3 FL (ref 7.0–12.0)
NEUTROPHILS: 83 %
NRBC: 0 PER 100 WBC
PLATELET: 169 10*3/uL (ref 150–400)
RBC: 4.38 M/uL — ABNORMAL LOW (ref 4.50–6.00)
RDW: 12.5 % (ref 11.5–13.5)
WBC: 7 10*3/uL (ref 4.8–10.8)

## 2020-07-29 LAB — ACETONE/KETONE, QL

## 2020-07-29 LAB — COVID-19,INFLUENZA A/B,RSV PANEL
Influenza A by PCR: NEGATIVE
Influenza B by PCR: NEGATIVE
RSV by PCR: NEGATIVE
SARS-CoV-2 by PCR: POSITIVE — CR

## 2020-07-29 LAB — URINE MICROSCOPIC WITH REFLEX CULTURE

## 2020-07-29 LAB — TROPONIN I: Troponin-I, Qt.: <0.04 ng/mL (ref <0.040)

## 2020-07-29 LAB — GLUCOSE, POC
Glucose (POC): 297 mg/dL — ABNORMAL HIGH (ref 75–110)
Glucose (POC): 380 mg/dL — ABNORMAL HIGH (ref 75–110)

## 2020-07-29 LAB — LACTIC ACID
Lactic Acid: 2.7 MMOL/L (ref 0.5–2.0)
Lactic Acid: 2.8 MMOL/L (ref 0.5–2.0)
Lactic acid: 2.7 MMOL/L — CR (ref 0.5–2.0)
Lactic acid: 2.8 MMOL/L — CR (ref 0.5–2.0)

## 2020-07-29 LAB — MAGNESIUM
Magnesium: 2.4 mg/dL (ref 1.7–2.5)
Magnesium: 2.4 mg/dL (ref 1.7–2.5)

## 2020-07-29 LAB — COMPREHENSIVE METABOLIC PANEL
ALT: 38 U/L — ABNORMAL HIGH (ref 3–35)
AST: 34 U/L (ref 15–40)
Albumin/Globulin Ratio: 1
Albumin: 4.4 g/dL (ref 3.5–5.0)
Alkaline Phosphatase: 85 U/L (ref 35–100)
Anion Gap: 25 mmol/L
BUN: 33 MG/DL — ABNORMAL HIGH (ref 7–20)
Bun/Cre Ratio: 23 NA
CO2: 17 mmol/L — ABNORMAL LOW (ref 20–32)
Calcium: 9.7 MG/DL (ref 8.8–10.5)
Chloride: 99 mmol/L — ABNORMAL LOW (ref 100–110)
Creatinine: 1.46 MG/DL — ABNORMAL HIGH (ref 0.40–1.20)
EGFR IF NonAfrican American: 50 mL/min/{1.73_m2} — ABNORMAL LOW (ref >60)
GFR African American: 57 mL/min/{1.73_m2} — ABNORMAL LOW (ref >60)
Globulin: 4.2 g/dL
Glucose: 405 mg/dL — ABNORMAL HIGH (ref 75–110)
Potassium: 4.6 mmol/L (ref 3.5–5.0)
Sodium: 136 mmol/L (ref 135–145)
Total Bilirubin: 1.3 mg/dL — ABNORMAL HIGH (ref 0.10–1.20)
Total Protein: 8.6 g/dL — ABNORMAL HIGH (ref 6.2–8.0)

## 2020-07-29 LAB — BASIC METABOLIC PANEL
Anion Gap: 20 mmol/L
BUN: 31 MG/DL — ABNORMAL HIGH (ref 7–20)
Bun/Cre Ratio: 25 NA
CO2: 17 mmol/L — ABNORMAL LOW (ref 20–32)
Calcium: 9.2 MG/DL (ref 8.8–10.5)
Chloride: 105 mmol/L (ref 100–110)
Creatinine: 1.23 MG/DL — ABNORMAL HIGH (ref 0.40–1.20)
EGFR IF NonAfrican American: >60 mL/min/{1.73_m2} (ref >60)
GFR African American: >60 mL/min/{1.73_m2} (ref >60)
Glucose: 283 mg/dL — ABNORMAL HIGH (ref 75–110)
Potassium: 4 mmol/L (ref 3.5–5.0)
Sodium: 138 mmol/L (ref 135–145)

## 2020-07-29 LAB — COVID-19 & INFLUENZA COMBO
RSV By PCR: NEGATIVE
Rapid Influenza A By PCR: NEGATIVE
Rapid Influenza B By PCR: NEGATIVE
SARS-CoV-2: POSITIVE — CR

## 2020-07-29 LAB — POCT GLUCOSE
POC Glucose: 297 mg/dL — ABNORMAL HIGH (ref 75–110)
POC Glucose: 380 mg/dL — ABNORMAL HIGH (ref 75–110)

## 2020-07-29 LAB — CBC WITH AUTO DIFFERENTIAL
Basophils %: 0 %
Basophils Absolute: 0 10*3/uL (ref 0.0–0.2)
Eosinophils %: 0 %
Eosinophils Absolute: 0 10*3/uL (ref 0.0–0.5)
Granulocyte Absolute Count: 0 10*3/uL (ref 0.0–0.1)
Hematocrit: 40.1 % — ABNORMAL LOW (ref 42.0–52.0)
Hemoglobin: 13.6 g/dL — ABNORMAL LOW (ref 14.0–18.0)
Immature Granulocytes: 0 %
Lymphocytes %: 11 %
Lymphocytes Absolute: 0.8 10*3/uL — ABNORMAL LOW (ref 1.0–4.5)
MCH: 31.1 PG (ref 28.0–34.0)
MCHC: 33.9 g/dL (ref 32.0–36.0)
MCV: 91.6 FL (ref 80.0–100.0)
MPV: 10.3 FL (ref 7.0–12.0)
Monocytes %: 6 %
Monocytes Absolute: 0.5 10*3/uL (ref 0.1–0.8)
NRBC Absolute: 0 10*3/uL
Neutrophils %: 83 %
Neutrophils Absolute: 5.7 10*3/uL (ref 1.9–7.8)
Nucleated RBCs: 0 PER 100 WBC
Platelets: 169 10*3/uL (ref 150–400)
RBC: 4.38 M/uL — ABNORMAL LOW (ref 4.50–6.00)
RDW: 12.5 % (ref 11.5–13.5)
WBC: 7 10*3/uL (ref 4.8–10.8)

## 2020-07-29 LAB — TROPONIN: Troponin I: <0.04 ng/mL (ref <0.040)

## 2020-07-29 LAB — URINALYSIS WITH REFLEX TO CULTURE
Glucose, Ur: >=1000 mg/dL — AB
Ketones, Urine: >=80 mg/dL — AB
Leukocyte Esterase, Urine: NEGATIVE
Nitrite, Urine: NEGATIVE
Protein, UA: 30 mg/dL — AB
Specific Gravity, UA: 1.025 NA (ref 1.005–1.030)
Urobilinogen, UA, POCT: 0.2 EU/dL (ref 0.1–1.0)
pH, UA: 5.5 NA (ref 5.0–9.0)

## 2020-07-29 MED ORDER — SODIUM CHLORIDE 0.9 % IJ SYRG
Freq: Once | INTRAMUSCULAR | Status: DC
Start: 2020-07-29 — End: 2020-07-29
  Administered 2020-07-29: 20:00:00 via INTRAVENOUS

## 2020-07-29 MED ORDER — SODIUM CHLORIDE 0.9% BOLUS IV
0.9 % | Freq: Once | INTRAVENOUS | Status: AC
Start: 2020-07-29 — End: 2020-07-29
  Administered 2020-07-29: 21:00:00 via INTRAVENOUS

## 2020-07-29 MED ORDER — INSULIN REGULAR HUMAN 100 UNIT/ML INJECTION
100 unit/mL | INTRAMUSCULAR | Status: AC
Start: 2020-07-29 — End: 2020-07-29
  Administered 2020-07-29: 20:00:00 via INTRAVENOUS

## 2020-07-29 MED FILL — SODIUM CHLORIDE 0.9 % IV: INTRAVENOUS | Qty: 1000

## 2020-07-29 MED FILL — HUMULIN R REGULAR U-100 INSULIN 100 UNIT/ML INJECTION SOLUTION: 100 unit/mL | INTRAMUSCULAR | Qty: 1

## 2020-07-29 MED FILL — BD POSIFLUSH NORMAL SALINE 0.9 % INJECTION SYRINGE: INTRAMUSCULAR | Qty: 10

## 2020-07-29 NOTE — Progress Notes (Signed)
 [facility]: MED RECONCILIATION    Comments/Recommendations: PTA list entered based off MAR from [facility]    Information obtained from: NA    Significant PMH/Disease States: NA    Chief Complaint for this Admission: NA    Allergies: Shellfish derived    Prior to Admission Medications:     Medication Documentation Review Audit       Reviewed by Gretta Lauraine PARAS, PHARMD (Pharmacist) on 07/29/20 at 1841      Medication Sig Documenting Provider Last Dose Status Taking?   acetaminophen  (TYLENOL ) 325 mg tablet Take 650 mg by mouth every four (4) hours as needed for Pain or Fever (tamp > 100). Provider, Historical  Active    amLODIPine  (NORVASC ) 10 mg tablet Take 1 Tab by mouth daily. Indications: high blood pressure Cramm, Paul, FNP  Active    benazepriL (LOTENSIN) 5 mg tablet Take 10 mg by mouth daily. Provider, Historical  Active    bisacodyL (DULCOLAX) 10 mg supp Insert 10 mg into rectum daily as needed for Constipation. Provider, Historical  Active Yes   cetirizine  (ZYRTEC ) 10 mg tablet Take 10 mg by mouth daily. Provider, Historical  Active    donepeziL  (ARICEPT ) 5 mg tablet Take 5 mg by mouth nightly. Provider, Historical  Active    Eliquis  2.5 mg tablet TAKE 1 TABLET BY MOUTH TWICE DAILY. Jacqualin Mt, FNP  Active    glucagon (GlucaGen HypoKit) 1 mg injection 1 mg by IntraVENous route once. Provider, Historical  Active    glucose blood VI test strips (Prodigy No Coding) strip USE 1 STRIP TO TEST BLOOD GLUCOSE 4 TIMES DAILY. Jacqualin Mt, FNP  Active    ibuprofen (MOTRIN) 400 mg tablet Take 400 mg by mouth every six (6) hours as needed for Pain. Provider, Historical  Active Yes   insulin  aspart U-100 (NOVOLOG) 100 unit/mL (3 mL) inpn Inject 7 units breakfast, lunch, supper plus correction (3 units for every 30 points over 150 to max of 9 extra units) Sparlin, Sheryl A, FNP  Active    insulin  glargine (Basaglar  KwikPen U-100 Insulin ) 100 unit/mL (3 mL) inpn Inject 38 units Am and 10 units PM subq E10.65   Indications: type 1 diabetes mellitus Sparlin, Sheryl A, FNP  Active    Insulin  Syringe-Needle U-100 0.3 mL 30 syrg 1 Each by SubCUTAneous route four (4) times daily. Jacqualin Mt, FNP  Active    lancets misc Use one lancet to test blood glucose 4 times daily Cramm, Mt, FNP  Active    levothyroxine  (SYNTHROID ) 50 mcg tablet Take 1 Tablet by mouth Daily (before breakfast). Jacqualin Mt, FNP  Active    magnesium  citrate solution Take 296 mL by mouth now. Prn if no results after enema. If no results in 1 hour of completion of bowl protocol, call MD immediately for further orders.  Provider, Historical  Active    magnesium  hydroxide (Phillips Milk of Magnesia) 400 mg/5 mL suspension Take 30 mL by mouth nightly as needed for Constipation (If no BM for 3 days). Prn if no BM in 3 days Provider, Historical  Active    multivitamin (ONE A DAY) tablet Take 1 Tablet by mouth daily. Jacqualin Mt, FNP  Active    OTHER Monitor Blood Glucose 4 times daily, before meals and at 8PM Riker, Cherene RAMAN, MD  Active    pantoprazole  (PROTONIX ) 40 mg tablet Take 1 Tablet by mouth daily. Jacqualin Mt, FNP  Active    pen needle,diabetic dual safty (BD AutoShield Duo Pen  Needle) 30 gauge x 3/16 ndle For use with insulin  pens 5 times daily Sparlin, Sheryl A, FNP  Active    QUEtiapine  (SEROquel ) 50 mg tablet Take 1 Tablet by mouth two (2) times a day. Jacqualin Mt, FNP  Active    rosuvastatin  (CRESTOR ) 10 mg tablet Take 1 Tablet by mouth nightly. Jacqualin Mt, FNP  Active    tamsulosin  (Flomax ) 0.4 mg capsule Take 1 Capsule by mouth daily. Jacqualin Mt, FNP  Active                       Lauraine JINNY Gaskins, PHARMD   Contact: [phone]

## 2020-07-29 NOTE — ED Notes (Signed)
Care assumed at shift change.  Pt was observed to have door to room opened and was calling out to go to go to the bathroom and stating he needed water.  Pt returned to room and reoriented to need to stay in room and use call bell.  Pt again came to door stating need for water of which he was given .  VS were obtained with no difficulties and pt was able to verbalize understanding of having COVID and need to stay isolated although he is confused as to how he could have gotten it.  Pt was able to verbally understand he would be staying in hospital for the evening.  Was instructed in use of call bell.  Pt states he would like to sleep  Will continue to monitor

## 2020-07-29 NOTE — ED Notes (Signed)
Plan start Insulin Drip and IV Fluids. Recheck labs ~2hrs after that,. If things improving plan downgrade from ICU.

## 2020-07-29 NOTE — ED Provider Notes (Signed)
HPI   The patient is a 79 year old gentleman with past history as below including diabetes and dementia who presents from his long-term care facility.  He complains of few days of nausea and vomiting, denies fevers, sweats, chest or abdominal pain, cough or shortness of breath.  Reportedly glucose has been running high at his facility in spite of getting medications including insulin.  Otherwise the patient is a somewhat limited historian, while lying on the stretcher he denies body aches, joint or back pain, is currently afebrile.  Family is concerned about his elevated glucose in the setting of recent nausea and vomiting.  The patient states he is very thirsty and would like to take some fluids at this time.    Past Medical History:   Diagnosis Date   ??? Dementia (HCC)    ??? Diabetes (HCC)    ??? Grief     loss of wife 02/24/13   ??? Hypertension        No past surgical history on file.      No family history on file.    Social History     Socioeconomic History   ??? Marital status: SINGLE     Spouse name: Not on file   ??? Number of children: Not on file   ??? Years of education: Not on file   ??? Highest education level: Not on file   Occupational History   ??? Not on file   Tobacco Use   ??? Smoking status: Never Smoker   ??? Smokeless tobacco: Never Used   Substance and Sexual Activity   ??? Alcohol use: Never   ??? Drug use: Never   ??? Sexual activity: Not on file   Other Topics Concern   ??? Not on file   Social History Narrative    Widowed without children. Wife died about 02/24/2013    Previous employment as a Runner, broadcasting/film/video and principal    4 years of college     Social Determinants of Health     Financial Resource Strain:    ??? Difficulty of Paying Living Expenses:    Food Insecurity:    ??? Worried About Programme researcher, broadcasting/film/video in the Last Year:    ??? Barista in the Last Year:    Transportation Needs:    ??? Freight forwarder (Medical):    ??? Lack of Transportation (Non-Medical):    Physical Activity:    ??? Days of Exercise per Week:    ???  Minutes of Exercise per Session:    Stress:    ??? Feeling of Stress :    Social Connections:    ??? Frequency of Communication with Friends and Family:    ??? Frequency of Social Gatherings with Friends and Family:    ??? Attends Religious Services:    ??? Database administrator or Organizations:    ??? Attends Engineer, structural:    ??? Marital Status:    Intimate Programme researcher, broadcasting/film/video Violence:    ??? Fear of Current or Ex-Partner:    ??? Emotionally Abused:    ??? Physically Abused:    ??? Sexually Abused:          ALLERGIES: Shellfish derived    Review of Systems-somewhat limited due to dementia but negative for chest or abdominal pain, headache, shortness of breath, fevers, chills, sweats, cough, phlegm production.  Positive for nausea and vomiting or feeling generally unwell.  The remainder of a 10 system review was negative.  Vitals:    07/29/20 1423   BP: (!) 151/67   Pulse: 76   Resp: 18   Temp: 98.6 ??F (37 ??C)   SpO2: 96%            Physical Exam  Vitals and nursing note reviewed.   Constitutional:       General: He is not in acute distress.     Appearance: He is well-developed. He is not ill-appearing, toxic-appearing or diaphoretic.   HENT:      Head: Normocephalic and atraumatic.      Mouth/Throat:      Mouth: Mucous membranes are moist.   Eyes:      General: No scleral icterus.     Pupils: Pupils are equal, round, and reactive to light.   Cardiovascular:      Rate and Rhythm: Normal rate and regular rhythm.      Pulses: Normal pulses.   Pulmonary:      Effort: Pulmonary effort is normal. No accessory muscle usage.      Breath sounds: Normal breath sounds.   Abdominal:      General: Bowel sounds are normal. There is no distension.      Palpations: Abdomen is soft.      Tenderness: There is no abdominal tenderness. There is no guarding or rebound.   Musculoskeletal:         General: No tenderness.      Cervical back: Normal range of motion and neck supple. No tenderness.      Right lower leg: No edema.      Left lower leg: No  edema.   Lymphadenopathy:      Cervical: No cervical adenopathy.   Skin:     General: Skin is warm and dry.      Coloration: Skin is not jaundiced or pale.      Findings: No erythema.   Neurological:      Mental Status: He is alert.      Cranial Nerves: No cranial nerve deficit.      Sensory: No sensory deficit.      Motor: No weakness.      Coordination: Coordination normal.      Comments: Patient is awake and alert, speech is clear and fluent, vision grossly intact   Psychiatric:         Mood and Affect: Mood normal.         Behavior: Behavior normal.          MDM   Patient presents as described above.  Multiple labs ordered at triage to evaluate for underlying cause of symptoms including evaluation for DKA/hyperglycemic episode, check for urinary infection, pulmonary infection, other acute causes.  As he is at a long-term care facility and has some nonspecific symptoms will also obtain viral swab for COVID/influenza.  Patient does not appear to be in severe distress at this time but is uncomfortable and with hyperglycemia there is concern for DKA.  ED Course as of Jul 29 1844   Tue Jul 29, 2020   1714 Labs show unremarkable CBC, CMP with acute renal insufficiency, elevated glucose, elevated anion gap, low CO2 all concerning for DKA.  Troponin is negative, magnesium is normal, electrolytes unremarkable, acetone shows large value, urinalysis with ketones and glucose but no signs of infection.    [Alejandro [name]]   1752 Glucose improving with treatment, viral swab is positive for COVID which is likely trigger for patient's underlying symptoms.  Plan to repeat lactic acid as the  patient has been receiving IV fluid hydration, plan to recheck BMP and also check a VBG, will admit to the hospitalist for further care.    [Alejandro [name]]   1757 I spoke with Alejandro [name], patient's listed next of kin and updated her regarding the positive COVID finding and plan for admission.  Questions were answered and she understands the plan.    [Alejandro [name]]    1818 VBG shows normal pH, repeat BMP and lactic acid are pending.  Await hospitalist call back    [Alejandro [name]]   1821 Chest x-ray reviewed, pacemaker in place, no large infiltrate, effusion, or significant congestive changes.    [Alejandro [name]]   1832 Case discussed with Dr. Pola Cornahal who will see and admit    [Alejandro [name]]   1845 Repeat BMP shows improvement in glucose, anion gap, and creatinine.  Lactic acid is stable.  Ongoing treatment and admission planned.    [Alejandro [name]]      ED Course User Index  [Alejandro [name]] Carolin GuernseyHildebrand, Loukas Antonson M, MD       Procedures  NONE    Patient Vitals for the past 12 hrs:   Temp Pulse Resp BP SpO2   07/29/20 1423 98.6 ??F (37 ??C) 76 18 (!) 151/67 96 %       Lab findings during this visit (only abnormal values will be noted, if no value noted then the result was normal range):  Labs Reviewed   COVID-19,INFLUENZA A/B,RSV PANEL - Abnormal; Notable for the following components:       Result Value    SARS-CoV-2 Positive (*)     All other components within normal limits   CBC WITH AUTOMATED DIFF - Abnormal; Notable for the following components:    RBC 4.38 (*)     HGB 13.6 (*)     HCT 40.1 (*)     ABS. LYMPHOCYTES 0.8 (*)     All other components within normal limits   METABOLIC PANEL, COMPREHENSIVE - Abnormal; Notable for the following components:    Chloride 99 (*)     CO2 17 (*)     Glucose 405 (*)     BUN 33 (*)     Creatinine 1.46 (*)     GFR est AA 57 (*)     GFR est non-AA 50 (*)     Bilirubin, total 1.30 (*)     ALT (SGPT) 38 (*)     Protein, total 8.6 (*)     All other components within normal limits   ACETONE/KETONE, QL - Abnormal; Notable for the following components:    Acetone/Ketone serum, QL. LARGE (*)     All other components within normal limits   LACTIC ACID - Abnormal; Notable for the following components:    Lactic acid 2.7 (*)     All other components within normal limits   UA WITH REFLEX MICRO AND CULTURE - Abnormal; Notable for the following components:    Protein 30 (*)     Glucose >=1000 (*)     Ketone >=80 (*)      Bilirubin SMALL (*)     Blood SMALL (*)     All other components within normal limits   LACTIC ACID - Abnormal; Notable for the following components:    Lactic acid 2.8 (*)     All other components within normal limits   METABOLIC PANEL, BASIC - Abnormal; Notable for the following components:    CO2 17 (*)     Glucose 283 (*)  BUN 31 (*)     Creatinine 1.23 (*)     All other components within normal limits   BLOOD GAS, VENOUS - Abnormal; Notable for the following components:    VENOUS PCO2 28.7 (*)     VENOUS O2 SATURATION 90 (*)     VENOUS PO2 58 (*)     VENOUS BICARBONATE 16 (*)     VENOUS BASE DEFICIT >3.0 (*)     All other components within normal limits   GLUCOSE, POC - Abnormal; Notable for the following components:    Glucose (POC) 380 (*)     All other components within normal limits   GLUCOSE, POC - Abnormal; Notable for the following components:    Glucose (POC) 297 (*)     All other components within normal limits   MAGNESIUM   TROPONIN I   URINE MICROSCOPIC WITH REFLEX CULTURE       Radiology studies during this visit  No results found.    Medications given in the ED:  Medications   sodium chloride (NS) flush 5-10 mL (has no administration in time range)   sodium chloride 0.9 % bolus infusion 1,000 mL (0 mL IntraVENous IV Completed 07/29/20 1739)   insulin regular (NOVOLIN R, HUMULIN R) injection 10 Units (10 Units IntraVENous Given 07/29/20 1619)       Diagnosis:    ICD-10-CM ICD-9-CM   1. Diabetic ketoacidosis without coma associated with other specified diabetes mellitus (HCC)  E13.10 250.12   2. Non-intractable vomiting with nausea, unspecified vomiting type  R11.2 787.01   3. Dehydration  E86.0 276.51   4. COVID-19  U07.1 079.89   5. Acute renal insufficiency  N28.9 593.9       Condition at disposition:  Condition stable    Disposition:  Admit    Please note that portions of this document were created using the M*Modal Fluency Direct dictation system.  Any inconsistencies or typographical errors  may be the result of mis-transcription that persist in spite of proof-reading and should be addressed with the document creator.

## 2020-07-29 NOTE — H&P (Signed)
H&P by Evalyn Casco, MD at  07/29/20 1909                Author: Evalyn Casco, MD  Service: Hospitalist  Author Type: Physician       Filed: 07/30/20 0643  Date of Service: 07/29/20 1909  Status: Addendum          Editor: Evalyn Casco, MD (Physician)          Related Notes: Original Note by Evalyn Casco, MD (Physician) filed at 07/29/20 1927                               History and Physical          Patient: Alejandro [name]  MRN: [id number]   SSN: [id number]          Date of Birth: 1940/11/21   Age: 79 y.o.   Sex: male         PCP: Duayne Cal, FNP        Subjective:         Alejandro [name] is a 79 y.o.  male with multiple comorbidities including HTN, IDDM, Hypothyroidism and Dementia, who presents from his [place] with complains of nausea and vomiting for the last few days. Reportedly  his glucose has been high at his facility as well. He does report feeling extremely dry and thirsty. The patient himself provides very limited history and denies any abdominal pain, chest pain, fever, chills, cough, or shortness of breath.       In the ED upon presentation, his vitals were stable.  Lab work was notable for severe hyperglycemia with anion gap metabolic acidosis with large serum ketones and elevated lactic acid, BUN/creatinine of 33/1.06.  His COVID 19 test was positive.  He was  given a L of fluid bolus and 10 units of regular insulin, and referred for inpatient care.         Past Medical History:        Diagnosis  Date         ?  Dementia ([place])       ?  Diabetes ([place])       ?  Grief            loss of wife 2014         ?  Hypertension          History reviewed. No pertinent surgical history.    History reviewed. No pertinent family history.     Social History          Tobacco Use         ?  Smoking status:  Never Smoker     ?  Smokeless tobacco:  Never Used       Substance Use Topics         ?  Alcohol use:  Never           Prior to Admission medications             Medication  Sig  Start Date   End Date  Taking?  Authorizing Provider            bisacodyL (DULCOLAX) 10 mg supp  Insert 10 mg into rectum daily as needed for Constipation.      Yes  Provider, Historical            ibuprofen (MOTRIN) 400 mg tablet  Take 400 mg by  mouth every six (6) hours as needed for Pain.      Yes  Provider, Historical     glucose blood VI test strips (Prodigy No Coding) strip  USE 1 STRIP TO TEST BLOOD GLUCOSE 4 TIMES DAILY.  07/28/20      Duayne Cal, FNP     insulin glargine (Basaglar KwikPen U-100 Insulin) 100 unit/mL (3 mL) inpn  Inject 38 units Am and 10 units PM subq E10.65  Indications: type 1 diabetes mellitus  07/23/20      Sparlin, Sheryl A, FNP     Eliquis 2.5 mg tablet  TAKE 1 TABLET BY MOUTH TWICE DAILY.  07/21/20      Duayne Cal, FNP     rosuvastatin (CRESTOR) 10 mg tablet  Take 1 Tablet by mouth nightly.  07/03/20      Duayne Cal, FNP     insulin aspart U-100 (NOVOLOG) 100 unit/mL (3 mL) inpn  Inject 7 units breakfast, lunch, supper plus correction (3 units for every 30 points over 150 to max of 9 extra units)  06/10/20      Sparlin, Sheryl A, FNP     pen needle,diabetic dual safty (BD AutoShield Duo Pen Needle) 30 gauge x 3/16" ndle  For use with insulin pens 5 times daily  06/10/20      Sparlin, Sheryl A, FNP     levothyroxine (SYNTHROID) 50 mcg tablet  Take 1 Tablet by mouth Daily (before breakfast).  05/15/20      Duayne Cal, FNP     pantoprazole (PROTONIX) 40 mg tablet  Take 1 Tablet by mouth daily.  05/15/20      Duayne Cal, FNP     tamsulosin (Flomax) 0.4 mg capsule  Take 1 Capsule by mouth daily.  05/14/20      Duayne Cal, FNP     multivitamin (ONE A DAY) tablet  Take 1 Tablet by mouth daily.  05/14/20      Duayne Cal, FNP     OTHER  Monitor Blood Glucose 4 times daily, before meals and at [facility]  05/14/20      Riker, Neomia Dear, MD     QUEtiapine (SEROquel) 50 mg tablet  Take 1 Tablet by mouth two (2) times a day.  05/06/20      Duayne Cal, FNP     lancets misc  Use one lancet to test blood glucose 4 times daily   05/06/20      Duayne Cal, FNP     Insulin Syringe-Needle U-100 0.3 mL 30 syrg  1 Each by SubCUTAneous route four (4) times daily.  05/06/20      Duayne Cal, FNP     amLODIPine (NORVASC) 10 mg tablet  Take 1 Tab by mouth daily. Indications: high blood pressure  03/18/20      Duayne Cal, FNP     acetaminophen (TYLENOL) 325 mg tablet  Take 650 mg by mouth every four (4) hours as needed for Pain or Fever (tamp > 100).        Provider, Historical     magnesium citrate solution  Take 296 mL by mouth now. Prn if no results after enema. If no results in 1 hour of completion of bowl protocol, call MD immediately for further  orders.         Provider, Historical     magnesium hydroxide (Phillips Milk of Magnesia) 400 mg/5 mL suspension  Take 30 mL by mouth nightly as needed for Constipation (If no BM for 3  days). Prn if no BM in 3 days        Provider, Historical     benazepriL (LOTENSIN) 5 mg tablet  Take 10 mg by mouth daily.        Provider, Historical     cetirizine (ZYRTEC) 10 mg tablet  Take 10 mg by mouth daily.        Provider, Historical     donepeziL (ARICEPT) 5 mg tablet  Take 5 mg by mouth nightly.        Provider, Historical            glucagon (GlucaGen HypoKit) 1 mg injection  1 mg by IntraVENous route once.        Provider, Historical              Allergies        Allergen  Reactions         ?  Shellfish Derived  Anaphylaxis           Review of Systems:   ROS was negative except as mentioned in the HPI above.        Objective:          Vitals:          07/29/20 1423        BP:  (!) 151/67     Pulse:  76     Resp:  18     Temp:  98.6 ??F (37 ??C)        SpO2:  96%               Physical Exam:   General: elderly male, conversant, no acute distress   HEENT: PERRLA, EOMI, no pallor or icterus   Neck: supple   Chest: bilateral equal air entry with no added sounds, no wheezing or crepts   CVS: normal S1S2 with no obvious murmurs or gallop   Abdomen: soft, non tender, no rebound, guarding or rigidity, normoactive bowel  sound   Extremities: no pedal edema, no cyanosis or tremors   Neurological: awake, alert. conversant and mostly oriented but cannot provide details. Power 5/5 across major joints. Sensation grossly intact. Cranial nerves grossly intact.              Recent Results (from the past 12 hour(s))     CBC WITH AUTOMATED DIFF          Collection Time: 07/29/20  3:33 PM         Result  Value  Ref Range            WBC  7.0  4.8 - 10.8 K/uL       RBC  4.38 (L)  4.50 - 6.00 M/uL       HGB  13.6 (L)  14.0 - 18.0 g/dL       HCT  40.1 (L)  42.0 - 52.0 %       MCV  91.6  80.0 - 100.0 FL       MCH  31.1  28.0 - 34.0 PG       MCHC  33.9  32.0 - 36.0 g/dL       RDW  12.5  11.5 - 13.5 %       PLATELET  169  150 - 400 K/uL       MPV  10.3  7.0 - 12.0 FL       NEUTROPHILS  83  %       LYMPHOCYTES  11  %       MONOCYTES  6  %       EOSINOPHILS  0  %       BASOPHILS  0  %       IMMATURE GRANULOCYTES  0  %       ABS. NEUTROPHILS  5.7  1.9 - 7.8 K/UL       ABS. LYMPHOCYTES  0.8 (L)  1.0 - 4.5 K/UL       ABS. MONOCYTES  0.5  0.1 - 0.8 K/UL       ABS. EOSINOPHILS  0.0  0.0 - 0.5 K/UL       ABS. BASOPHILS  0.0  0.0 - 0.2 K/UL       ABS. IMM. GRANS.  0.0  0.0 - 0.1 K/UL       NRBC  0.0  PER 100 WBC       ABSOLUTE NRBC  0.00  K/uL       DF  AUTOMATED         METABOLIC PANEL, COMPREHENSIVE          Collection Time: 07/29/20  3:33 PM         Result  Value  Ref Range            Sodium  136  135 - 145 mmol/L       Potassium  4.6  3.5 - 5.0 mmol/L       Chloride  99 (L)  100 - 110 mmol/L       CO2  17 (L)  20 - 32 mmol/L       Anion gap  25  mmol/L       Glucose  405 (H)  75 - 110 mg/dL       BUN  33 (H)  7 - 20 MG/DL       Creatinine  1.46 (H)  0.40 - 1.20 MG/DL       BUN/Creatinine ratio  23         GFR est AA  57 (L)  >60 ml/min/1.70m       GFR est non-AA  50 (L)  >60 ml/min/1.753m      Calcium  9.7  8.8 - 10.5 MG/DL       Bilirubin, total  1.30 (H)  0.10 - 1.20 mg/dL       ALT (SGPT)  38 (H)  3 - 35 U/L       AST (SGOT)  34  15 - 40 U/L        Alk. phosphatase  85  35 - 100 U/L       Protein, total  8.6 (H)  6.2 - 8.0 g/dL       Albumin  4.4  3.5 - 5.0 g/dL       Globulin  4.2  g/dL       A-G Ratio  1.0          MAGNESIUM          Collection Time: 07/29/20  3:33 PM         Result  Value  Ref Range            Magnesium  2.4  1.7 - 2.5 mg/dL       TROPONIN I          Collection Time: 07/29/20  3:33 PM         Result  Value  Ref Range  Troponin-I, Qt.  <0.040  <0.040 ng/mL       ACETONE/KETONE, QL          Collection Time: 07/29/20  3:33 PM         Result  Value  Ref Range            Acetone/Ketone serum, QL.  LARGE (A)  Negative            LACTIC ACID          Collection Time: 07/29/20  3:33 PM         Result  Value  Ref Range            Lactic acid  2.7 (HH)  0.5 - 2.0 MMOL/L       GLUCOSE, POC          Collection Time: 07/29/20  4:45 PM         Result  Value  Ref Range            Glucose (POC)  380 (H)  75 - 110 mg/dL       Performed by  Ainsley Spinner         UA WITH REFLEX MICRO AND CULTURE          Collection Time: 07/29/20  4:52 PM       Specimen: Urine         Result  Value  Ref Range            Color  YELLOW  YELLOW       Appearance  CLEAR  CLEAR       Specific gravity  1.025  1.005 - 1.030       pH (UA)  5.5  5.0 - 9.0       Protein  30 (A)  Negative mg/dL       Glucose  >=1000 (A)  Negative mg/dL       Ketone  >=80 (A)  Negative mg/dL       Bilirubin  SMALL (A)  Negative       Blood  SMALL (A)  Negative       Urobilinogen  0.2  0.1 - 1.0 EU/dL       Nitrites  Negative  Negative       Leukocyte Esterase  Negative  Negative       COVID-19,INFLUENZA A/B,RSV PANEL          Collection Time: 07/29/20  4:52 PM         Result  Value  Ref Range            Specimen source  NP SWAB         SARS-CoV-2  Positive (AA)  Negative       Influenza A by PCR  Negative  Negative       Influenza B by PCR  Negative  Negative       RSV by PCR  Negative  Negative       URINE MICROSCOPIC WITH REFLEX CULTURE          Collection Time: 07/29/20  4:52 PM       Specimen:  Urine         Result  Value  Ref Range            WBC  0-3  <6 /hpf       RBC  0-3  <3 /hpf       Epithelial cells  SQUAMOUS EPITHELIAL CELLS  RARE     /lpf       UA:UC IF INDICATED  CULTURE NOT INDICATED BY UA RESULT              Casts  GRANULAR CAST   0-3     /lpf       GLUCOSE, POC          Collection Time: 07/29/20  5:48 PM         Result  Value  Ref Range            Glucose (POC)  297 (H)  75 - 110 mg/dL       Performed by  Tommi Rumps         LACTIC ACID          Collection Time: 07/29/20  6:11 PM         Result  Value  Ref Range            Lactic acid  2.8 (HH)  0.5 - 2.0 MMOL/L       METABOLIC PANEL, BASIC          Collection Time: 07/29/20  6:11 PM         Result  Value  Ref Range            Sodium  138  135 - 145 mmol/L       Potassium  4.0  3.5 - 5.0 mmol/L       Chloride  105  100 - 110 mmol/L       CO2  17 (L)  20 - 32 mmol/L       Anion gap  20  mmol/L       Glucose  283 (H)  75 - 110 mg/dL       BUN  31 (H)  7 - 20 MG/DL       Creatinine  1.23 (H)  0.40 - 1.20 MG/DL       BUN/Creatinine ratio  25         GFR est AA  >60  >60 ml/min/1.30m       GFR est non-AA  >60  >60 ml/min/1.745m      Calcium  9.2  8.8 - 10.5 MG/DL       BLOOD GAS, VENOUS          Collection Time: 07/29/20  6:11 PM         Result  Value  Ref Range            VENOUS PH  7.37  7.34 - 7.45       VENOUS PCO2  28.7 (L)  40 - 60 mmHg       VENOUS O2 SATURATION  90 (L)  95 - 98 %       VENOUS PO2  58 (H)  35 - 55 mmHg       VENOUS BICARBONATE  16 (L)  22 - 27 mmol/L            VENOUS BASE DEFICIT  >3.0 (H)  0.0 - 2.0 mmol/L            [id number] APPLICABLE         SITE  VENOUS         Drawn by  NOT APPLICABLE              CO2, VENOUS  17  mmol/L           No  results found.        Assessment:           Hospital Problems   Date Reviewed:  August 21, 2020                     Codes  Class  Noted  POA              COVID-19  ICD-10-CM: U07.1   ICD-9-CM: 079.89    21-Aug-2020  Unknown                        DKA (diabetic ketoacidoses)  ICD-10-CM:  E11.10   ICD-9-CM: 250.12    08/21/20  Unknown                        DKA, type 1 ([place])  ICD-10-CM: E10.10   ICD-9-CM: 250.13    06/10/2020  Unknown                            Plan:        # DKA in IDDM, likely secondary to acute COVID-19 infection   - will admit to ICU   - start on Insulin gtt   - 1/2 NS @ 250 mls/hr with 40 mEq KCl   - monitor FS BS q1h, BMP q2H   - given the patient mild DKA, expect his DKA to resolve quickly and will look to stop IV Insulin and switch to SC insulin and move the patient out of ICU as soon as feasible. This is all the more reasonable given the very tight situation with ICU beds  currently      ADDENDUM 6:40 AM, 9/22:  Patient DKA has resolved with symptomatic improvement. Will stop insulin gtt and start on subcutaneous insulin and resume  diet.  Will transfer him out of ICU.          # Lactic acidosis, likely related to above   - IVF   - trend         # COVID-19 infection   - tested positive in the ER   - no respiratory symptoms. Saturating well on room air. Will not start on COVID-19 specific treatment at this time   - close clinical monitoring with daily labs             # HTN   # Hypothyroidism   # H/o CVA with residual hemiplegia and hemiparesis   - resume home meds         # Dementia   - high risk of delirium in the hospital   - maintain general delirium preventive measures         # Diet: NPO for now      # DVT Prophylaxis: on eliquis      # Code status: FULL.  Spoke with the patient's niece/legal guardian Altha Harm, she wants to discuss with her brother who is also the patient's legal guardian before deciding any change in code status            This note was dictated through voice recognition software.  I apologize for any misspelt words, erroneous phrases or other associated errors.         Signed By:  Evalyn Casco, MD           08-21-20

## 2020-07-29 NOTE — Telephone Encounter (Signed)
[name] at [facility] called to report Alejandro [name]'s blood sugars are high and he has barely eaten anything.  His sugar was 511 at lunch time.    Please advise what to do.    #[phone]

## 2020-07-29 NOTE — ED Notes (Signed)
PT reports vomiting and elevated home blood glucose for the past three days.

## 2020-07-29 NOTE — ED Notes (Signed)
[name] hights updated on + COVID

## 2020-07-30 LAB — METABOLIC PANEL, BASIC
Anion gap: 20 mmol/L
Anion gap: 14 mmol/L
Anion gap: 12 mmol/L
Anion gap: 14 mmol/L
BUN/Creatinine ratio: 28
BUN/Creatinine ratio: 28
BUN/Creatinine ratio: 28
BUN/Creatinine ratio: 23
BUN: 30 MG/DL — ABNORMAL HIGH (ref 7–20)
BUN: 27 MG/DL — ABNORMAL HIGH (ref 7–20)
BUN: 24 MG/DL — ABNORMAL HIGH (ref 7–20)
BUN: 21 MG/DL — ABNORMAL HIGH (ref 7–20)
CO2: 18 mmol/L — ABNORMAL LOW (ref 20–32)
CO2: 19 mmol/L — ABNORMAL LOW (ref 20–32)
CO2: 22 mmol/L (ref 20–32)
CO2: 21 mmol/L (ref 20–32)
Calcium: 9.4 MG/DL (ref 8.8–10.5)
Calcium: 8.5 MG/DL — ABNORMAL LOW (ref 8.8–10.5)
Calcium: 8.8 MG/DL (ref 8.8–10.5)
Calcium: 8.7 MG/DL — ABNORMAL LOW (ref 8.8–10.5)
Chloride: 103 mmol/L (ref 100–110)
Chloride: 108 mmol/L (ref 100–110)
Chloride: 109 mmol/L (ref 100–110)
Chloride: 108 mmol/L (ref 100–110)
Creatinine: 1.08 MG/DL (ref 0.40–1.20)
Creatinine: 0.96 MG/DL (ref 0.40–1.20)
Creatinine: 0.85 MG/DL (ref 0.40–1.20)
Creatinine: 0.92 MG/DL (ref 0.40–1.20)
GFR est AA: >60 mL/min/{1.73_m2} (ref >60)
GFR est AA: >60 mL/min/{1.73_m2} (ref >60)
GFR est AA: >60 mL/min/{1.73_m2} (ref >60)
GFR est AA: >60 mL/min/{1.73_m2} (ref >60)
GFR est non-AA: >60 mL/min/{1.73_m2} (ref >60)
GFR est non-AA: >60 mL/min/{1.73_m2} (ref >60)
GFR est non-AA: >60 mL/min/{1.73_m2} (ref >60)
GFR est non-AA: >60 mL/min/{1.73_m2} (ref >60)
Glucose: 244 mg/dL — ABNORMAL HIGH (ref 75–110)
Glucose: 256 mg/dL — ABNORMAL HIGH (ref 75–110)
Glucose: 113 mg/dL — ABNORMAL HIGH (ref 75–110)
Glucose: 89 mg/dL (ref 75–110)
Potassium: 4.5 mmol/L (ref 3.5–5.0)
Potassium: 4 mmol/L (ref 3.5–5.0)
Potassium: 3.9 mmol/L (ref 3.5–5.0)
Potassium: 4 mmol/L (ref 3.5–5.0)
Sodium: 136 mmol/L (ref 135–145)
Sodium: 137 mmol/L (ref 135–145)
Sodium: 139 mmol/L (ref 135–145)
Sodium: 139 mmol/L (ref 135–145)

## 2020-07-30 LAB — LACTIC ACID W/REFLEX
Lactic Acid w/ Reflex: 2.1 MMOL/L — CR (ref 0.5–2.0)
Lactic Acid w/ Reflex: 1.1 MMOL/L (ref 0.5–2.0)

## 2020-07-30 LAB — CBC WITH AUTOMATED DIFF
ABS. BASOPHILS: 0 10*3/uL (ref 0.0–0.2)
ABS. EOSINOPHILS: 0 10*3/uL (ref 0.0–0.5)
ABS. IMM. GRANS.: 0 10*3/uL (ref 0.0–0.1)
ABS. LYMPHOCYTES: 1.6 10*3/uL (ref 1.0–4.5)
ABS. MONOCYTES: 0.8 10*3/uL (ref 0.1–0.8)
ABS. NEUTROPHILS: 6.5 10*3/uL (ref 1.9–7.8)
ABSOLUTE NRBC: 0 10*3/uL
BASOPHILS: 0 %
EOSINOPHILS: 0 %
HCT: 34.3 % — ABNORMAL LOW (ref 42.0–52.0)
HGB: 11.8 g/dL — ABNORMAL LOW (ref 14.0–18.0)
IMMATURE GRANULOCYTES: 0 %
LYMPHOCYTES: 17 %
MCH: 31.1 PG (ref 28.0–34.0)
MCHC: 34.4 g/dL (ref 32.0–36.0)
MCV: 90.3 FL (ref 80.0–100.0)
MONOCYTES: 9 %
MPV: 10.5 FL (ref 7.0–12.0)
NEUTROPHILS: 74 %
NRBC: 0 PER 100 WBC
PLATELET: 155 10*3/uL (ref 150–400)
RBC: 3.8 M/uL — ABNORMAL LOW (ref 4.50–6.00)
RDW: 12.4 % (ref 11.5–13.5)
WBC: 8.9 10*3/uL (ref 4.8–10.8)

## 2020-07-30 LAB — GLUCOSE, POC
Glucose (POC): 232 mg/dL — ABNORMAL HIGH (ref 75–110)
Glucose (POC): 235 mg/dL — ABNORMAL HIGH (ref 75–110)
Glucose (POC): 251 mg/dL — ABNORMAL HIGH (ref 75–110)
Glucose (POC): 252 mg/dL — ABNORMAL HIGH (ref 75–110)
Glucose (POC): 218 mg/dL — ABNORMAL HIGH (ref 75–110)
Glucose (POC): 144 mg/dL — ABNORMAL HIGH (ref 75–110)
Glucose (POC): 81 mg/dL (ref 75–110)
Glucose (POC): 75 mg/dL (ref 75–110)
Glucose (POC): 89 mg/dL (ref 75–110)
Glucose (POC): 220 mg/dL — ABNORMAL HIGH (ref 75–110)

## 2020-07-30 LAB — MAGNESIUM
Magnesium: 2.3 mg/dL (ref 1.7–2.5)
Magnesium: 2.3 mg/dL (ref 1.7–2.5)
Magnesium: 2.2 mg/dL (ref 1.7–2.5)
Magnesium: 2.2 mg/dL (ref 1.7–2.5)
Magnesium: 2.3 mg/dL (ref 1.7–2.5)
Magnesium: 2.3 mg/dL (ref 1.7–2.5)
Magnesium: 2.2 mg/dL (ref 1.7–2.5)
Magnesium: 2.2 mg/dL (ref 1.7–2.5)

## 2020-07-30 LAB — PHOSPHORUS
Phosphorus: 2.4 MG/DL (ref 2.4–4.7)
Phosphorus: 2.4 MG/DL (ref 2.4–4.7)

## 2020-07-30 LAB — C REACTIVE PROTEIN, QT: C-Reactive protein: 2.9 mg/dL — ABNORMAL HIGH (ref 0.00–0.90)

## 2020-07-30 LAB — LACTIC ACID, REFLEX
Lactic Acid W/ Reflex: 2.1 MMOL/L (ref 0.5–2.0)
Lactic Acid W/ Reflex: 1.1 MMOL/L (ref 0.5–2.0)

## 2020-07-30 LAB — POCT GLUCOSE
POC Glucose: 232 mg/dL — ABNORMAL HIGH (ref 75–110)
POC Glucose: 235 mg/dL — ABNORMAL HIGH (ref 75–110)
POC Glucose: 251 mg/dL — ABNORMAL HIGH (ref 75–110)
POC Glucose: 252 mg/dL — ABNORMAL HIGH (ref 75–110)
POC Glucose: 218 mg/dL — ABNORMAL HIGH (ref 75–110)
POC Glucose: 144 mg/dL — ABNORMAL HIGH (ref 75–110)
POC Glucose: 81 mg/dL (ref 75–110)
POC Glucose: 75 mg/dL (ref 75–110)
POC Glucose: 89 mg/dL (ref 75–110)
POC Glucose: 220 mg/dL — ABNORMAL HIGH (ref 75–110)

## 2020-07-30 LAB — CBC WITH AUTO DIFFERENTIAL
Basophils %: 0 %
Basophils Absolute: 0 10*3/uL (ref 0.0–0.2)
Eosinophils %: 0 %
Eosinophils Absolute: 0 10*3/uL (ref 0.0–0.5)
Granulocyte Absolute Count: 0 10*3/uL (ref 0.0–0.1)
Hematocrit: 34.3 % — ABNORMAL LOW (ref 42.0–52.0)
Hemoglobin: 11.8 g/dL — ABNORMAL LOW (ref 14.0–18.0)
Immature Granulocytes: 0 %
Lymphocytes %: 17 %
Lymphocytes Absolute: 1.6 10*3/uL (ref 1.0–4.5)
MCH: 31.1 PG (ref 28.0–34.0)
MCHC: 34.4 g/dL (ref 32.0–36.0)
MCV: 90.3 FL (ref 80.0–100.0)
MPV: 10.5 FL (ref 7.0–12.0)
Monocytes %: 9 %
Monocytes Absolute: 0.8 10*3/uL (ref 0.1–0.8)
NRBC Absolute: 0 10*3/uL
Neutrophils %: 74 %
Neutrophils Absolute: 6.5 10*3/uL (ref 1.9–7.8)
Nucleated RBCs: 0 PER 100 WBC
Platelets: 155 10*3/uL (ref 150–400)
RBC: 3.8 M/uL — ABNORMAL LOW (ref 4.50–6.00)
RDW: 12.4 % (ref 11.5–13.5)
WBC: 8.9 10*3/uL (ref 4.8–10.8)

## 2020-07-30 LAB — BASIC METABOLIC PANEL
Anion Gap: 20 mmol/L
Anion Gap: 14 mmol/L
Anion Gap: 12 mmol/L
Anion Gap: 14 mmol/L
BUN: 30 MG/DL — ABNORMAL HIGH (ref 7–20)
BUN: 27 MG/DL — ABNORMAL HIGH (ref 7–20)
BUN: 24 MG/DL — ABNORMAL HIGH (ref 7–20)
BUN: 21 MG/DL — ABNORMAL HIGH (ref 7–20)
Bun/Cre Ratio: 28 NA
Bun/Cre Ratio: 28 NA
Bun/Cre Ratio: 28 NA
Bun/Cre Ratio: 23 NA
CO2: 18 mmol/L — ABNORMAL LOW (ref 20–32)
CO2: 19 mmol/L — ABNORMAL LOW (ref 20–32)
CO2: 22 mmol/L (ref 20–32)
CO2: 21 mmol/L (ref 20–32)
Calcium: 9.4 MG/DL (ref 8.8–10.5)
Calcium: 8.5 MG/DL — ABNORMAL LOW (ref 8.8–10.5)
Calcium: 8.8 MG/DL (ref 8.8–10.5)
Calcium: 8.7 MG/DL — ABNORMAL LOW (ref 8.8–10.5)
Chloride: 103 mmol/L (ref 100–110)
Chloride: 108 mmol/L (ref 100–110)
Chloride: 109 mmol/L (ref 100–110)
Chloride: 108 mmol/L (ref 100–110)
Creatinine: 1.08 MG/DL (ref 0.40–1.20)
Creatinine: 0.96 MG/DL (ref 0.40–1.20)
Creatinine: 0.85 MG/DL (ref 0.40–1.20)
Creatinine: 0.92 MG/DL (ref 0.40–1.20)
EGFR IF NonAfrican American: >60 mL/min/{1.73_m2} (ref >60)
EGFR IF NonAfrican American: >60 mL/min/{1.73_m2} (ref >60)
EGFR IF NonAfrican American: >60 mL/min/{1.73_m2} (ref >60)
EGFR IF NonAfrican American: >60 mL/min/{1.73_m2} (ref >60)
GFR African American: >60 mL/min/{1.73_m2} (ref >60)
GFR African American: >60 mL/min/{1.73_m2} (ref >60)
GFR African American: >60 mL/min/{1.73_m2} (ref >60)
GFR African American: >60 mL/min/{1.73_m2} (ref >60)
Glucose: 244 mg/dL — ABNORMAL HIGH (ref 75–110)
Glucose: 256 mg/dL — ABNORMAL HIGH (ref 75–110)
Glucose: 113 mg/dL — ABNORMAL HIGH (ref 75–110)
Glucose: 89 mg/dL (ref 75–110)
Potassium: 4.5 mmol/L (ref 3.5–5.0)
Potassium: 4 mmol/L (ref 3.5–5.0)
Potassium: 3.9 mmol/L (ref 3.5–5.0)
Potassium: 4 mmol/L (ref 3.5–5.0)
Sodium: 136 mmol/L (ref 135–145)
Sodium: 137 mmol/L (ref 135–145)
Sodium: 139 mmol/L (ref 135–145)
Sodium: 139 mmol/L (ref 135–145)

## 2020-07-30 LAB — C-REACTIVE PROTEIN: CRP: 2.9 mg/dL — ABNORMAL HIGH (ref 0.00–0.90)

## 2020-07-30 MED ORDER — DEXTROSE 10% IN WATER (D10W) IV
10 % | INTRAVENOUS | Status: DC | PRN
Start: 2020-07-30 — End: 2020-07-30

## 2020-07-30 MED ORDER — GLUCOSE 4 GRAM CHEWABLE TAB
4 gram | ORAL | Status: DC | PRN
Start: 2020-07-30 — End: 2020-07-30

## 2020-07-30 MED ORDER — INSULIN REGULAR HUMAN 100 UNIT/ML INJECTION
100 unit/mL | INTRAMUSCULAR | Status: DC
Start: 2020-07-30 — End: 2020-07-30
  Administered 2020-07-30 (×2): via INTRAVENOUS

## 2020-07-30 MED ORDER — DEXTROSE 40 % ORAL GEL
40 % | ORAL | Status: DC | PRN
Start: 2020-07-30 — End: 2020-07-30

## 2020-07-30 MED ORDER — ACETAMINOPHEN 325 MG TABLET
325 mg | ORAL | Status: DC | PRN
Start: 2020-07-30 — End: 2020-07-30

## 2020-07-30 MED ORDER — QUETIAPINE 25 MG TAB
25 mg | Freq: Two times a day (BID) | ORAL | Status: DC
Start: 2020-07-30 — End: 2020-07-30
  Administered 2020-07-30 (×2): via ORAL

## 2020-07-30 MED ORDER — ONDANSETRON (PF) 4 MG/2 ML INJECTION
4 mg/2 mL | Freq: Four times a day (QID) | INTRAMUSCULAR | Status: DC | PRN
Start: 2020-07-30 — End: 2020-07-30

## 2020-07-30 MED ORDER — SODIUM CHLORIDE 0.9 % IJ SYRG
INTRAMUSCULAR | Status: DC | PRN
Start: 2020-07-30 — End: 2020-07-30

## 2020-07-30 MED ORDER — PANTOPRAZOLE 40 MG TAB, DELAYED RELEASE
40 mg | Freq: Every day | ORAL | Status: DC
Start: 2020-07-30 — End: 2020-07-30
  Administered 2020-07-30: 13:00:00 via ORAL

## 2020-07-30 MED ORDER — ACETAMINOPHEN 650 MG RECTAL SUPPOSITORY
650 mg | RECTAL | Status: DC | PRN
Start: 2020-07-30 — End: 2020-07-30

## 2020-07-30 MED ORDER — INSULIN ASPART 100 UNIT/ML (3 ML) SUB-Q PEN
100 unit/mL (3 mL) | Freq: Four times a day (QID) | SUBCUTANEOUS | Status: DC
Start: 2020-07-30 — End: 2020-07-30

## 2020-07-30 MED ORDER — ROSUVASTATIN 10 MG TAB
10 mg | Freq: Every evening | ORAL | Status: DC
Start: 2020-07-30 — End: 2020-07-30
  Administered 2020-07-30: 02:00:00 via ORAL

## 2020-07-30 MED ORDER — ACETAMINOPHEN (TYLENOL) SOLUTION 32MG/ML
ORAL | Status: DC | PRN
Start: 2020-07-30 — End: 2020-07-30

## 2020-07-30 MED ORDER — TAMSULOSIN SR 0.4 MG 24 HR CAP
0.4 mg | Freq: Every day | ORAL | Status: DC
Start: 2020-07-30 — End: 2020-07-30
  Administered 2020-07-30: 13:00:00 via ORAL

## 2020-07-30 MED ORDER — 1/2 NS WITH POTASSIUM CHLORIDE 20 MEQ/L IV
20 mEq/L | INTRAVENOUS | Status: DC
Start: 2020-07-30 — End: 2020-07-30
  Administered 2020-07-30 (×2): via INTRAVENOUS

## 2020-07-30 MED ORDER — BISACODYL 10 MG RECTAL SUPPOSITORY
10 mg | Freq: Every day | RECTAL | Status: DC | PRN
Start: 2020-07-30 — End: 2020-07-30

## 2020-07-30 MED ORDER — 1/2 NS WITH POTASSIUM CHLORIDE 20 MEQ/L IV
20 mEq/L | INTRAVENOUS | Status: DC
Start: 2020-07-30 — End: 2020-07-29
  Administered 2020-07-30: 02:00:00 via INTRAVENOUS

## 2020-07-30 MED ORDER — DONEPEZIL 5 MG TAB
5 mg | Freq: Every evening | ORAL | Status: DC
Start: 2020-07-30 — End: 2020-07-30
  Administered 2020-07-30: 02:00:00 via ORAL

## 2020-07-30 MED ORDER — INSULIN GLARGINE 100 UNIT/ML (3 ML) SUB-Q PEN
100 unit/mL (3 mL) | Freq: Every day | SUBCUTANEOUS | Status: DC
Start: 2020-07-30 — End: 2020-07-30
  Administered 2020-07-30: 13:00:00 via SUBCUTANEOUS

## 2020-07-30 MED ORDER — LEVOTHYROXINE 50 MCG TAB
50 mcg | Freq: Every day | ORAL | Status: DC
Start: 2020-07-30 — End: 2020-07-30
  Administered 2020-07-30: 13:00:00 via ORAL

## 2020-07-30 MED ORDER — GLUCAGON 1 MG INJECTION
1 mg | INTRAMUSCULAR | Status: DC | PRN
Start: 2020-07-30 — End: 2020-07-30

## 2020-07-30 MED ORDER — SODIUM CHLORIDE 0.9 % IJ SYRG
Freq: Two times a day (BID) | INTRAMUSCULAR | Status: DC
Start: 2020-07-30 — End: 2020-07-30
  Administered 2020-07-30: 01:00:00 via INTRAVENOUS

## 2020-07-30 MED ORDER — QUETIAPINE 25 MG TAB
25 mg | Freq: Once | ORAL | Status: DC
Start: 2020-07-30 — End: 2020-07-30
  Administered 2020-07-30: 02:00:00 via ORAL

## 2020-07-30 MED ORDER — INSULIN ASPART 100 UNIT/ML (3 ML) SUB-Q PEN
100 unit/mL (3 mL) | Freq: Four times a day (QID) | SUBCUTANEOUS | Status: DC
Start: 2020-07-30 — End: 2020-07-30
  Administered 2020-07-30: 17:00:00 via SUBCUTANEOUS

## 2020-07-30 MED ORDER — APIXABAN 2.5 MG TABLET
2.5 mg | Freq: Two times a day (BID) | ORAL | Status: DC
Start: 2020-07-30 — End: 2020-07-30
  Administered 2020-07-30 (×2): via ORAL

## 2020-07-30 MED ORDER — AMLODIPINE 5 MG TAB
5 mg | Freq: Every day | ORAL | Status: DC
Start: 2020-07-30 — End: 2020-07-30
  Administered 2020-07-30: 13:00:00 via ORAL

## 2020-07-30 MED ORDER — ONDANSETRON 4 MG TAB, RAPID DISSOLVE
4 mg | Freq: Four times a day (QID) | ORAL | Status: DC | PRN
Start: 2020-07-30 — End: 2020-07-30

## 2020-07-30 MED ORDER — D5-1/2 NS & POTASSIUM CHLORIDE 20 MEQ/L IV
20 mEq/L | INTRAVENOUS | Status: DC
Start: 2020-07-30 — End: 2020-07-30
  Administered 2020-07-30: 03:00:00 via INTRAVENOUS

## 2020-07-30 MED ORDER — LORATADINE 10 MG TAB
10 mg | Freq: Every day | ORAL | Status: DC
Start: 2020-07-30 — End: 2020-07-30
  Administered 2020-07-30: 13:00:00 via ORAL

## 2020-07-30 MED ORDER — MAGNESIUM HYDROXIDE 400 MG/5 ML ORAL SUSP
400 mg/5 mL | Freq: Every evening | ORAL | Status: DC | PRN
Start: 2020-07-30 — End: 2020-07-30

## 2020-07-30 MED ORDER — SODIUM CHLORIDE 0.45 % IV
0.45 % | INTRAVENOUS | Status: DC
Start: 2020-07-30 — End: 2020-07-30

## 2020-07-30 MED FILL — D5-1/2 NS & POTASSIUM CHLORIDE 20 MEQ/L IV: 20 mEq/L | INTRAVENOUS | Qty: 1000

## 2020-07-30 MED FILL — 1/2 NS WITH POTASSIUM CHLORIDE 20 MEQ/L IV: 20 mEq/L | INTRAVENOUS | Qty: 1000

## 2020-07-30 MED FILL — NOVOLIN R REGULAR U-100 INSULIN 100 UNIT/ML INJECTION SOLUTION: 100 unit/mL | INTRAMUSCULAR | Qty: 1

## 2020-07-30 MED FILL — NOVOLOG FLEXPEN U-100 INSULIN ASPART 100 UNIT/ML (3 ML) SUBCUTANEOUS: 100 unit/mL (3 mL) | SUBCUTANEOUS | Qty: 3

## 2020-07-30 MED FILL — BD POSIFLUSH NORMAL SALINE 0.9 % INJECTION SYRINGE: INTRAMUSCULAR | Qty: 10

## 2020-07-30 MED FILL — TAMSULOSIN SR 0.4 MG 24 HR CAP: 0.4 mg | ORAL | Qty: 1

## 2020-07-30 MED FILL — PANTOPRAZOLE 40 MG TAB, DELAYED RELEASE: 40 mg | ORAL | Qty: 1

## 2020-07-30 MED FILL — ROSUVASTATIN 10 MG TAB: 10 mg | ORAL | Qty: 1

## 2020-07-30 MED FILL — QUETIAPINE 25 MG TAB: 25 mg | ORAL | Qty: 2

## 2020-07-30 MED FILL — ELIQUIS 2.5 MG TABLET: 2.5 mg | ORAL | Qty: 1

## 2020-07-30 MED FILL — LORATADINE 10 MG TAB: 10 mg | ORAL | Qty: 1

## 2020-07-30 MED FILL — SODIUM CHLORIDE 0.45 % IV: 0.45 % | INTRAVENOUS | Qty: 1000

## 2020-07-30 MED FILL — AMLODIPINE 5 MG TAB: 5 mg | ORAL | Qty: 2

## 2020-07-30 MED FILL — SEMGLEE PEN U-100 INSULIN 100 UNIT/ML (3 ML) SUBCUTANEOUS: 100 unit/mL (3 mL) | SUBCUTANEOUS | Qty: 3

## 2020-07-30 MED FILL — LEVOTHYROXINE 50 MCG TAB: 50 mcg | ORAL | Qty: 1

## 2020-07-30 MED FILL — DONEPEZIL 5 MG TAB: 5 mg | ORAL | Qty: 1

## 2020-07-30 NOTE — Progress Notes (Signed)
Pt still in ED but being discharged from there. Dr and Primary RN arranged for pt to go back to [facility]. POA aware and agrees.

## 2020-07-30 NOTE — Discharge Summary (Signed)
Discharge Summary by Shelbie HutchingMilburn, Tytiana Coles C, DO at 07/30/20 1350                Author: Shelbie HutchingMilburn, Jessenia Filippone C, DO  Service: Internal Medicine  Author Type: Physician       Filed: 07/30/20 1623  Date of Service: 07/30/20 1350  Status: Signed          Editor: Shelbie HutchingMilburn, Rilley Poulter C, DO (Physician)                                   Physician Discharge Summary               Patient: Alejandro ShinerWilliam [name]  MRN: [id number]   SSN: [id number]      Date of Birth: 01/05/1941   Age: 79 y.o.   Sex: male      PCP: Ramon Dredgeramm, Paul, FNP      Admit date: 07/29/2020   Admitting Provider: Crecencio McSumit Dahal, MD      Discharge date: 07/30/2020   Discharging Provider: Shelbie HutchingSteven C Lovina Zuver, DO      * Admission Diagnoses: DKA (diabetic ketoacidoses) [E11.10]      * Discharge Diagnoses:   Diabetic ketoacidosis       Diabetes mellitus, type 2 (insulin requiring)       COVID-19+       Dementia       Hypertension          * Hospital Course:   This 79 yo male patient presents with nausea and vomiting for the past few days.  He felt extremely dry and thirsty.   While in the ER, he was found to be in early DKA and was given IV fluids as well as IV insulin.  His lactic was minimally elevated and responded to iv fluids.  His electrolytes were stable during the admission.  The following morning, he felt better and  was able to eat a regular consistency meal.  He did not have any further nausea or vomiting.  He did receive his morning lantus insulin.     While in the ER, he was found to have COVID-19 + pcr. He did not demonstrate respiratory symptoms and his saturations remained stable while in the ER.  [facility] and was asked to monitor his symptoms.   He was advised to insolation for addition 8 days.      * Procedures: none         Consults: none      Significant Diagnostic Studies:  8.9 11.8 155  139 108 21 89         34.3    4  21   0.92      crp 2.9      COVID-19         Ref Range & Units  07/29/20 1652         Specimen source     NP SWAB          SARS-CoV-2  Negative  PositivePanic         Comment: This test has been authorized by the FDA under an Emergency Use Authorization  (EUA) for use by authorized laboratories.   Methodology: RT-PCR   CALLED RESULTS TO/VERIFIED READ BACK WITH   JAMES ELLOITT,RN          Influenza A by PCR  Negative  Negative      Influenza B  by PCR  Negative  Negative      RSV by PCR  Negative  Negative            Discharge Exam:   98.6 59 16 147/64  97%   Gen:  Alert and mostly oriented-slightly confused   Eyes non icteric sclera   Mouth; moist   Heart regular rate and rhythm   Lung clear and good effort   abd soft non tender   Ext no edema   Skin warm       * Discharge Condition: improved   * Disposition: [name]      Discharge Medications:     Current Discharge Medication List              CONTINUE these medications which have NOT CHANGED          Details        bisacodyL (DULCOLAX) 10 mg supp  Insert 10 mg into rectum daily as needed for Constipation.               ibuprofen (MOTRIN) 400 mg tablet  Take 400 mg by mouth every six (6) hours as needed for Pain.               glucose blood VI test strips (Prodigy No Coding) strip  USE 1 STRIP TO TEST BLOOD GLUCOSE 4 TIMES DAILY.   Qty: 120 Strip, Refills:  11               insulin glargine (Basaglar KwikPen U-100 Insulin) 100 unit/mL (3 mL) inpn  Inject 38 units Am and 10 units PM subq E10.65  Indications: type 1 diabetes mellitus   Qty: 15 mL, Refills:  11          Associated Diagnoses: Type 2 diabetes mellitus treated with insulin (HCC)               Eliquis 2.5 mg tablet  TAKE 1 TABLET BY MOUTH TWICE DAILY.   Qty: 60 Tablet, Refills:  11               rosuvastatin (CRESTOR) 10 mg tablet  Take 1 Tablet by mouth nightly.   Qty: 90 Tablet, Refills:  0               insulin aspart U-100 (NOVOLOG) 100 unit/mL (3 mL) inpn  Inject 7 units breakfast, lunch, supper plus correction (3 units for every 30 points over 150 to max of 9 extra units)   Qty: 15 mL, Refills:  11                pen needle,diabetic dual safty (BD AutoShield Duo Pen Needle) 30 gauge x 3/16" ndle  For use with insulin pens 5 times daily   Qty: 200 Pen Needle, Refills:  11               levothyroxine (SYNTHROID) 50 mcg tablet  Take 1 Tablet by mouth Daily (before breakfast).   Qty: 90 Tablet, Refills:  0          Comments: MAR purposes only, refill as needed               pantoprazole (PROTONIX) 40 mg tablet  Take 1 Tablet by mouth daily.   Qty: 90 Tablet, Refills:  0          Comments: MAR purposes only, refill as needed               tamsulosin (  Flomax) 0.4 mg capsule  Take 1 Capsule by mouth daily.   Qty: 90 Capsule, Refills:  0          Comments: Please add to MAR               multivitamin (ONE A DAY) tablet  Take 1 Tablet by mouth daily.   Qty: 90 Tablet, Refills:  3          Comments: Please add to [facility]               OTHER  Monitor Blood Glucose 4 times daily, before meals and at 8PM   Qty: 1 Each, Refills:  0          Comments: MAR purposes only. please include space to document the site of glucose monitoring               QUEtiapine (SEROquel) 50 mg tablet  Take 1 Tablet by mouth two (2) times a day.   Qty: 180 Tablet, Refills:  0               lancets misc  Use one lancet to test blood glucose 4 times daily   Qty: 100 Each, Refills:  3               Insulin Syringe-Needle U-100 0.3 mL 30 syrg  1 Each by SubCUTAneous route four (4) times daily.   Qty: 100 Syringe, Refills:  3               amLODIPine (NORVASC) 10 mg tablet  Take 1 Tab by mouth daily. Indications: high blood pressure   Qty: 90 Tab, Refills:  3          Associated Diagnoses: Essential hypertension               acetaminophen (TYLENOL) 325 mg tablet  Take 650 mg by mouth every four (4) hours as needed for Pain or Fever (tamp > 100).               magnesium citrate solution  Take 296 mL by mouth now. Prn if no results after enema. If no results in 1 hour of completion of bowl protocol, call MD immediately for further orders.                 magnesium hydroxide (Phillips Milk of Magnesia) 400 mg/5 mL suspension  Take 30 mL by mouth nightly as needed for Constipation (If no BM for 3 days). Prn if no BM in 3 days               benazepriL (LOTENSIN) 5 mg tablet  Take 10 mg by mouth daily.               cetirizine (ZYRTEC) 10 mg tablet  Take 10 mg by mouth daily.               donepeziL (ARICEPT) 5 mg tablet  Take 5 mg by mouth nightly.               glucagon (GlucaGen HypoKit) 1 mg injection  1 mg by IntraVENous route once.                         * Follow-up Care/Patient Instructions:   Activity: Activity as tolerated   Diet: Diabetic Diet           Follow-up Information  Follow up With  Specialties  Details  Why  Contact Info              Ramon Dredge, FNP  Internal Medicine      [address] [place] [address]   [phone]                Time spent in preparation of this discharge was greater than 35 minutes      Signed:   Shelbie Hutching, DO   07/30/2020   1:50 PM

## 2020-07-30 NOTE — ED Notes (Signed)
D/c pink warm and dry by ems

## 2020-07-30 NOTE — ED Notes (Signed)
Report to [facility] and poa christine called with report , she agreed with d/c

## 2020-07-30 NOTE — Progress Notes (Signed)
Hospitalist Progress Note    Subjective:   Daily Progress Note: 07/30/2020 8:52 AM    This 79 yo male patient is currently sitting up and eating breakfast.  He is drinking liquids well. Remains slightly confused but otherwise he reports feeling well    Current Facility-Administered Medications   Medication Dose Route Frequency   ??? insulin glargine (LANTUS,BASAGLAR) pen 38 Units  38 Units SubCUTAneous DAILY   ??? dextrose 40% (GLUTOSE) oral gel 1 Tube  1 Tube Oral PRN   ??? insulin aspart U-100 (NOVOLOG) pen   SubCUTAneous AC&HS   ??? 0.45% sodium chloride infusion  100 mL/hr IntraVENous CONTINUOUS   ??? glucose chewable tablet 16 g  4 Tablet Oral PRN   ??? dextrose 10% infusion 250 mL  250 mL IntraVENous PRN   ??? amLODIPine (NORVASC) tablet 10 mg  10 mg Oral DAILY   ??? bisacodyL (DULCOLAX) suppository 10 mg  10 mg Rectal DAILY PRN   ??? loratadine (CLARITIN) tablet 10 mg  10 mg Oral DAILY   ??? donepeziL (ARICEPT) tablet 5 mg  5 mg Oral QHS   ??? apixaban (ELIQUIS) tablet 2.5 mg  2.5 mg Oral BID   ??? levothyroxine (SYNTHROID) tablet 50 mcg  50 mcg Oral ACB   ??? magnesium hydroxide (MILK OF MAGNESIA) 400 mg/5 mL oral suspension 30 mL  30 mL Oral QHS PRN   ??? pantoprazole (PROTONIX) tablet 40 mg  40 mg Oral DAILY   ??? QUEtiapine (SEROquel) tablet 50 mg  50 mg Oral BID   ??? rosuvastatin (CRESTOR) tablet 10 mg  10 mg Oral QHS   ??? tamsulosin (FLOMAX) capsule 0.4 mg  0.4 mg Oral DAILY   ??? sodium chloride (NS) flush 3-10 mL  3-10 mL IntraVENous Q12H   ??? sodium chloride (NS) flush 3-10 mL  3-10 mL IntraVENous PRN   ??? glucagon (GLUCAGEN) injection 1 mg  1 mg IntraMUSCular PRN   ??? acetaminophen (TYLENOL) tablet 650 mg  650 mg Oral Q4H PRN    Or   ??? acetaminophen (TYLENOL) solution 650 mg  650 mg Oral Q4H PRN    Or   ??? acetaminophen (TYLENOL) suppository 650 mg  650 mg Rectal Q4H PRN   ??? ondansetron (ZOFRAN) injection 4 mg  4 mg IntraVENous Q6H PRN    Or   ??? ondansetron (ZOFRAN ODT) tablet 4 mg  4 mg Oral Q6H PRN     Current Outpatient Medications    Medication Sig   ??? bisacodyL (DULCOLAX) 10 mg supp Insert 10 mg into rectum daily as needed for Constipation.   ??? ibuprofen (MOTRIN) 400 mg tablet Take 400 mg by mouth every six (6) hours as needed for Pain.   ??? glucose blood VI test strips (Prodigy No Coding) strip USE 1 STRIP TO TEST BLOOD GLUCOSE 4 TIMES DAILY.   ??? insulin glargine (Basaglar KwikPen U-100 Insulin) 100 unit/mL (3 mL) inpn Inject 38 units Am and 10 units PM subq E10.65  Indications: type 1 diabetes mellitus   ??? Eliquis 2.5 mg tablet TAKE 1 TABLET BY MOUTH TWICE DAILY.   ??? rosuvastatin (CRESTOR) 10 mg tablet Take 1 Tablet by mouth nightly.   ??? insulin aspart U-100 (NOVOLOG) 100 unit/mL (3 mL) inpn Inject 7 units breakfast, lunch, supper plus correction (3 units for every 30 points over 150 to max of 9 extra units)   ??? pen needle,diabetic dual safty (BD AutoShield Duo Pen Needle) 30 gauge x 3/16" ndle For use with insulin pens 5  times daily   ??? levothyroxine (SYNTHROID) 50 mcg tablet Take 1 Tablet by mouth Daily (before breakfast).   ??? pantoprazole (PROTONIX) 40 mg tablet Take 1 Tablet by mouth daily.   ??? tamsulosin (Flomax) 0.4 mg capsule Take 1 Capsule by mouth daily.   ??? multivitamin (ONE A DAY) tablet Take 1 Tablet by mouth daily.   ??? OTHER Monitor Blood Glucose 4 times daily, before meals and at 8PM   ??? QUEtiapine (SEROquel) 50 mg tablet Take 1 Tablet by mouth two (2) times a day.   ??? lancets misc Use one lancet to test blood glucose 4 times daily   ??? Insulin Syringe-Needle U-100 0.3 mL 30 syrg 1 Each by SubCUTAneous route four (4) times daily.   ??? amLODIPine (NORVASC) 10 mg tablet Take 1 Tab by mouth daily. Indications: high blood pressure   ??? acetaminophen (TYLENOL) 325 mg tablet Take 650 mg by mouth every four (4) hours as needed for Pain or Fever (tamp > 100).   ??? magnesium citrate solution Take 296 mL by mouth now. Prn if no results after enema. If no results in 1 hour of completion of bowl protocol, call MD immediately for further  orders.    ??? magnesium hydroxide (Phillips Milk of Magnesia) 400 mg/5 mL suspension Take 30 mL by mouth nightly as needed for Constipation (If no BM for 3 days). Prn if no BM in 3 days   ??? benazepriL (LOTENSIN) 5 mg tablet Take 10 mg by mouth daily.   ??? cetirizine (ZYRTEC) 10 mg tablet Take 10 mg by mouth daily.   ??? donepeziL (ARICEPT) 5 mg tablet Take 5 mg by mouth nightly.   ??? glucagon (GlucaGen HypoKit) 1 mg injection 1 mg by IntraVENous route once.        Review of Systems  Pertinent items are noted in HPI.    Objective:     Visit Vitals  BP (!) 127/59   Pulse (!) 59   Temp 98.6 ??F (37 ??C)   Resp 17   Wt 73 kg (161 lb)   SpO2 96%   BMI 22.45 kg/m??      O2 Device: None (Room air)    Temp (24hrs), Avg:98.6 ??F (37 ??C), Min:98.6 ??F (37 ??C), Max:98.6 ??F (37 ??C)      No intake/output data recorded.  09/20 1901 - 09/22 0700  In: 1000 [I.V.:1000]  Out: -     Gen;  Alert and mostly oriented  Eyes non icteric sclera  Mouth moist  Heart regular rate and oriented  Lung:  Clear and good effort  abd soft non tender  Ext no edema  Skin warm and dry  Neuro pleasantly confused  Ms calf are soft    Data Review    Recent Results (from the past 24 hour(s))   CBC WITH AUTOMATED DIFF    Collection Time: 07/29/20  3:33 PM   Result Value Ref Range    WBC 7.0 4.8 - 10.8 K/uL    RBC 4.38 (L) 4.50 - 6.00 M/uL    HGB 13.6 (L) 14.0 - 18.0 g/dL    HCT 40.1 (L) 42.0 - 52.0 %    MCV 91.6 80.0 - 100.0 FL    MCH 31.1 28.0 - 34.0 PG    MCHC 33.9 32.0 - 36.0 g/dL    RDW 12.5 11.5 - 13.5 %    PLATELET 169 150 - 400 K/uL    MPV 10.3 7.0 - 12.0 FL    NEUTROPHILS  83 %    LYMPHOCYTES 11 %    MONOCYTES 6 %    EOSINOPHILS 0 %    BASOPHILS 0 %    IMMATURE GRANULOCYTES 0 %    ABS. NEUTROPHILS 5.7 1.9 - 7.8 K/UL    ABS. LYMPHOCYTES 0.8 (L) 1.0 - 4.5 K/UL    ABS. MONOCYTES 0.5 0.1 - 0.8 K/UL    ABS. EOSINOPHILS 0.0 0.0 - 0.5 K/UL    ABS. BASOPHILS 0.0 0.0 - 0.2 K/UL    ABS. IMM. GRANS. 0.0 0.0 - 0.1 K/UL    NRBC 0.0 PER 100 WBC    ABSOLUTE NRBC 0.00 K/uL     DF AUTOMATED    METABOLIC PANEL, COMPREHENSIVE    Collection Time: 07/29/20  3:33 PM   Result Value Ref Range    Sodium 136 135 - 145 mmol/L    Potassium 4.6 3.5 - 5.0 mmol/L    Chloride 99 (L) 100 - 110 mmol/L    CO2 17 (L) 20 - 32 mmol/L    Anion gap 25 mmol/L    Glucose 405 (H) 75 - 110 mg/dL    BUN 33 (H) 7 - 20 MG/DL    Creatinine 1.46 (H) 0.40 - 1.20 MG/DL    BUN/Creatinine ratio 23     GFR est AA 57 (L) >60 ml/min/1.1m    GFR est non-AA 50 (L) >60 ml/min/1.724m   Calcium 9.7 8.8 - 10.5 MG/DL    Bilirubin, total 1.30 (H) 0.10 - 1.20 mg/dL    ALT (SGPT) 38 (H) 3 - 35 U/L    AST (SGOT) 34 15 - 40 U/L    Alk. phosphatase 85 35 - 100 U/L    Protein, total 8.6 (H) 6.2 - 8.0 g/dL    Albumin 4.4 3.5 - 5.0 g/dL    Globulin 4.2 g/dL    A-G Ratio 1.0     MAGNESIUM    Collection Time: 07/29/20  3:33 PM   Result Value Ref Range    Magnesium 2.4 1.7 - 2.5 mg/dL   TROPONIN I    Collection Time: 07/29/20  3:33 PM   Result Value Ref Range    Troponin-I, Qt. <0.040 <0.040 ng/mL   ACETONE/KETONE, QL    Collection Time: 07/29/20  3:33 PM   Result Value Ref Range    Acetone/Ketone serum, QL. LARGE (A) Negative        LACTIC ACID    Collection Time: 07/29/20  3:33 PM   Result Value Ref Range    Lactic acid 2.7 (HH) 0.5 - 2.0 MMOL/L   GLUCOSE, POC    Collection Time: 07/29/20  4:45 PM   Result Value Ref Range    Glucose (POC) 380 (H) 75 - 110 mg/dL    Performed by AsAinsley Spinner  UA WITH REFLEX MICRO AND CULTURE    Collection Time: 07/29/20  4:52 PM    Specimen: Urine   Result Value Ref Range    Color YELLOW YELLOW    Appearance CLEAR CLEAR    Specific gravity 1.025 1.005 - 1.030    pH (UA) 5.5 5.0 - 9.0    Protein 30 (A) Negative mg/dL    Glucose >=1000 (A) Negative mg/dL    Ketone >=80 (A) Negative mg/dL    Bilirubin SMALL (A) Negative    Blood SMALL (A) Negative    Urobilinogen 0.2 0.1 - 1.0 EU/dL    Nitrites Negative Negative    Leukocyte Esterase Negative Negative   COVID-19,INFLUENZA  A/B,RSV PANEL    Collection Time:  07/29/20  4:52 PM   Result Value Ref Range    Specimen source NP SWAB     SARS-CoV-2 Positive (AA) Negative    Influenza A by PCR Negative Negative    Influenza B by PCR Negative Negative    RSV by PCR Negative Negative   URINE MICROSCOPIC WITH REFLEX CULTURE    Collection Time: 07/29/20  4:52 PM    Specimen: Urine   Result Value Ref Range    WBC 0-3 <6 /hpf    RBC 0-3 <3 /hpf    Epithelial cells SQUAMOUS EPITHELIAL CELLS  RARE   /lpf    UA:UC IF INDICATED CULTURE NOT INDICATED BY UA RESULT     Casts GRANULAR CAST  0-3   /lpf   GLUCOSE, POC    Collection Time: 07/29/20  5:48 PM   Result Value Ref Range    Glucose (POC) 297 (H) 75 - 110 mg/dL    Performed by Tommi Rumps    LACTIC ACID    Collection Time: 07/29/20  6:11 PM   Result Value Ref Range    Lactic acid 2.8 (HH) 0.5 - 2.0 MMOL/L   METABOLIC PANEL, BASIC    Collection Time: 07/29/20  6:11 PM   Result Value Ref Range    Sodium 138 135 - 145 mmol/L    Potassium 4.0 3.5 - 5.0 mmol/L    Chloride 105 100 - 110 mmol/L    CO2 17 (L) 20 - 32 mmol/L    Anion gap 20 mmol/L    Glucose 283 (H) 75 - 110 mg/dL    BUN 31 (H) 7 - 20 MG/DL    Creatinine 1.23 (H) 0.40 - 1.20 MG/DL    BUN/Creatinine ratio 25     GFR est AA >60 >60 ml/min/1.60m    GFR est non-AA >60 >60 ml/min/1.754m   Calcium 9.2 8.8 - 10.5 MG/DL   BLOOD GAS, VENOUS    Collection Time: 07/29/20  6:11 PM   Result Value Ref Range    VENOUS PH 7.37 7.34 - 7.45    VENOUS PCO2 28.7 (L) 40 - 60 mmHg    VENOUS O2 SATURATION 90 (L) 95 - 98 %    VENOUS PO2 58 (H) 35 - 55 mmHg    VENOUS BICARBONATE 16 (L) 22 - 27 mmol/L    VENOUS BASE DEFICIT >3.0 (H) 0.0 - 2.0 mmol/L    VOOV5IEPOT APPLICABLE     SITE VENOUS     Drawn by NOT APPLICABLE     CO2, VENOUS 17 mmol/L   GLUCOSE, POC    Collection Time: 07/29/20  9:42 PM   Result Value Ref Range    Glucose (POC) 235 (H) 75 - 110 mg/dL    Performed by HaFlorentina Addison  METABOLIC PANEL, BASIC    Collection Time: 07/29/20 10:11 PM   Result Value Ref Range    Sodium 136 135 -  145 mmol/L    Potassium 4.5 3.5 - 5.0 mmol/L    Chloride 103 100 - 110 mmol/L    CO2 18 (L) 20 - 32 mmol/L    Anion gap 20 mmol/L    Glucose 244 (H) 75 - 110 mg/dL    BUN 30 (H) 7 - 20 MG/DL    Creatinine 1.08 0.40 - 1.20 MG/DL    BUN/Creatinine ratio 28     GFR est AA >60 >60 ml/min/1.7356m  GFR est non-AA >  60 >60 ml/min/1.82m    Calcium 9.4 8.8 - 10.5 MG/DL   MAGNESIUM    Collection Time: 07/29/20 10:11 PM   Result Value Ref Range    Magnesium 2.3 1.7 - 2.5 mg/dL   PHOSPHORUS    Collection Time: 07/29/20 10:11 PM   Result Value Ref Range    Phosphorus 2.4 2.4 - 4.7 MG/DL   LACTIC ACID W/REFLEX    Collection Time: 07/29/20 10:11 PM   Result Value Ref Range    Lactic Acid w/ Reflex 2.1 (HH) 0.5 - 2.0 MMOL/L   GLUCOSE, POC    Collection Time: 07/29/20 11:19 PM   Result Value Ref Range    Glucose (POC) 232 (H) 75 - 110 mg/dL    Performed by HFlorentina Addison   GLUCOSE, POC    Collection Time: 07/30/20 12:19 AM   Result Value Ref Range    Glucose (POC) 251 (H) 75 - 110 mg/dL    Performed by HFlorentina Addison   METABOLIC PANEL, BASIC    Collection Time: 07/30/20  1:17 AM   Result Value Ref Range    Sodium 137 135 - 145 mmol/L    Potassium 4.0 3.5 - 5.0 mmol/L    Chloride 108 100 - 110 mmol/L    CO2 19 (L) 20 - 32 mmol/L    Anion gap 14 mmol/L    Glucose 256 (H) 75 - 110 mg/dL    BUN 27 (H) 7 - 20 MG/DL    Creatinine 0.96 0.40 - 1.20 MG/DL    BUN/Creatinine ratio 28     GFR est AA >60 >60 ml/min/1.728m   GFR est non-AA >60 >60 ml/min/1.7342m  Calcium 8.5 (L) 8.8 - 10.5 MG/DL   LACTIC ACID W/REFLEX    Collection Time: 07/30/20  1:17 AM   Result Value Ref Range    Lactic Acid w/ Reflex 1.1 0.5 - 2.0 MMOL/L   MAGNESIUM    Collection Time: 07/30/20  1:17 AM   Result Value Ref Range    Magnesium 2.2 1.7 - 2.5 mg/dL   GLUCOSE, POC    Collection Time: 07/30/20  1:17 AM   Result Value Ref Range    Glucose (POC) 252 (H) 75 - 110 mg/dL    Performed by HarFlorentina Addison GLUCOSE, POC    Collection Time: 07/30/20  2:40 AM   Result Value  Ref Range    Glucose (POC) 218 (H) 75 - 110 mg/dL    Performed by HarFlorentina Addison GLUCOSE, POC    Collection Time: 07/30/20  4:09 AM   Result Value Ref Range    Glucose (POC) 144 (H) 75 - 110 mg/dL    Performed by HalSekiuASIC    Collection Time: 07/30/20  4:36 AM   Result Value Ref Range    Sodium 139 135 - 145 mmol/L    Potassium 3.9 3.5 - 5.0 mmol/L    Chloride 109 100 - 110 mmol/L    CO2 22 20 - 32 mmol/L    Anion gap 12 mmol/L    Glucose 113 (H) 75 - 110 mg/dL    BUN 24 (H) 7 - 20 MG/DL    Creatinine 0.85 0.40 - 1.20 MG/DL    BUN/Creatinine ratio 28     GFR est AA >60 >60 ml/min/1.47m47m GFR est non-AA >60 >60 ml/min/1.47m234mCalcium 8.8 8.8 - 10.5 MG/DL  CBC WITH AUTOMATED DIFF    Collection Time: 07/30/20  4:36 AM   Result Value Ref Range    WBC 8.9 4.8 - 10.8 K/uL    RBC 3.80 (L) 4.50 - 6.00 M/uL    HGB 11.8 (L) 14.0 - 18.0 g/dL    HCT 34.3 (L) 42.0 - 52.0 %    MCV 90.3 80.0 - 100.0 FL    MCH 31.1 28.0 - 34.0 PG    MCHC 34.4 32.0 - 36.0 g/dL    RDW 12.4 11.5 - 13.5 %    PLATELET 155 150 - 400 K/uL    MPV 10.5 7.0 - 12.0 FL    NEUTROPHILS 74 %    LYMPHOCYTES 17 %    MONOCYTES 9 %    EOSINOPHILS 0 %    BASOPHILS 0 %    IMMATURE GRANULOCYTES 0 %    ABS. NEUTROPHILS 6.5 1.9 - 7.8 K/UL    ABS. LYMPHOCYTES 1.6 1.0 - 4.5 K/UL    ABS. MONOCYTES 0.8 0.1 - 0.8 K/UL    ABS. EOSINOPHILS 0.0 0.0 - 0.5 K/UL    ABS. BASOPHILS 0.0 0.0 - 0.2 K/UL    ABS. IMM. GRANS. 0.0 0.0 - 0.1 K/UL    NRBC 0.0 PER 100 WBC    ABSOLUTE NRBC 0.00 K/uL    DF AUTOMATED    MAGNESIUM    Collection Time: 07/30/20  4:36 AM   Result Value Ref Range    Magnesium 2.3 1.7 - 2.5 mg/dL   C REACTIVE PROTEIN, QT    Collection Time: 07/30/20  4:36 AM   Result Value Ref Range    C-Reactive protein 2.90 (H) 0.00 - 0.90 mg/dL   GLUCOSE, POC    Collection Time: 07/30/20  5:04 AM   Result Value Ref Range    Glucose (POC) 81 75 - 110 mg/dL    Performed by [place], POC    Collection Time: 07/30/20  6:02 AM   Result  Value Ref Range    Glucose (POC) 75 75 - 110 mg/dL    Performed by [place], POC    Collection Time: 07/30/20  8:24 AM   Result Value Ref Range    Glucose (POC) 89 75 - 110 mg/dL    Performed by Mervyn Gay          Assessment/Plan:     Diabetic ketoacidosis   Will transition back to his lantus sq regimen as his acidosis has resolved   Will stop iv fluids and start po diet   Will add supplemental insulin to her regimen    COVID-19 infection   Tested positive in the ER   Relatively asymptomatic   Will discuss with [facility] about options to return    Diabetes mellitus, type 2   See above    Hypertension    controlled      Care Plan discussed with: Patient/Family and Nurse    Total time spent with patient: 15 minutes.    Signed By: Gypsy Lore, DO     July 30, 2020

## 2020-07-30 NOTE — ED Notes (Signed)
Alert some confusion , insulin drip off vss , given breakfast  , question plan for d/c today if continues to be stable

## 2020-07-30 NOTE — ED Notes (Signed)
Per Dr Pola Corn--  No change to insulin gtt or fluids.  Will reevaluate after labs result

## 2020-07-30 NOTE — ED Notes (Signed)
Dr Pola Corn informed of increase in glucose--  Will evaluate labs and place orders

## 2020-07-30 NOTE — ED Notes (Signed)
Pt remained in room and bed for the evening with no problems.  Hourly finger sticks were done at which point pt was able to express need to use urinal (which he can do independently with mgmt of wires and IV).  He continues to be oriented to situation and has been appropriate.  Has been inquiring about his sugar levels throughout the night and verbalizes understanding of levels and progress. States he is ready to try food-- Dr Pola Corn will initiate order.  Pt has no further requests at this time.

## 2020-07-31 NOTE — Telephone Encounter (Signed)
Noted.    Patient is covid+ Financial planner    Signed By: Candi Leash     July 31, 2020

## 2020-07-31 NOTE — Telephone Encounter (Signed)
Thanks, noted

## 2020-07-31 NOTE — Telephone Encounter (Signed)
Alejandro [name]  1940/11/21  [id number]    Sees C[facility]  D/C from [facility] on 07/30/20  May need TOC

## 2020-08-04 NOTE — Telephone Encounter (Signed)
Danna, nurse from [facility] called and said that they found the pt laying in the ground in his room. He doesn't remember falling and there was no injury. They wanted pcp to be aware.     [phone]

## 2020-08-05 NOTE — Telephone Encounter (Signed)
noted 

## 2020-08-06 ENCOUNTER — Emergency Department: Admit: 2020-08-06 | Payer: MEDICARE | Primary: Registered Nurse

## 2020-08-06 ENCOUNTER — Inpatient Hospital Stay
Admit: 2020-08-06 | Discharge: 2020-08-14 | Disposition: A | Discharge status: Home Health | Payer: MEDICARE | Attending: Internal Medicine | Admitting: Internal Medicine

## 2020-08-06 DIAGNOSIS — U071 COVID-19: Secondary | ICD-10-CM

## 2020-08-06 LAB — CBC WITH AUTOMATED DIFF
ABS. BASOPHILS: 0 10*3/uL (ref 0.0–0.2)
ABS. EOSINOPHILS: 0 10*3/uL (ref 0.0–0.5)
ABS. IMM. GRANS.: 0.1 10*3/uL (ref 0.0–0.1)
ABS. LYMPHOCYTES: 0.7 10*3/uL — ABNORMAL LOW (ref 1.0–4.5)
ABS. MONOCYTES: 0.2 10*3/uL (ref 0.1–0.8)
ABS. NEUTROPHILS: 8 10*3/uL — ABNORMAL HIGH (ref 1.9–7.8)
BASOPHILS: 0 %
EOSINOPHILS: 0 %
HCT: 34.1 % — ABNORMAL LOW (ref 42.0–52.0)
HGB: 11.9 g/dL — ABNORMAL LOW (ref 14.0–18.0)
IMMATURE GRANULOCYTES: 1 %
LYMPHOCYTES: 8 %
MCH: 31.2 PG (ref 28.0–34.0)
MCHC: 34.9 g/dL (ref 32.0–36.0)
MCV: 89.3 FL (ref 80.0–100.0)
MONOCYTES: 2 %
MPV: 9.9 FL (ref 7.0–12.0)
NEUTROPHILS: 89 %
NRBC: 0 PER 100 WBC (ref 0.0–1.0)
PLATELET: 160 10*3/uL (ref 150–400)
RBC: 3.82 M/uL — ABNORMAL LOW (ref 4.50–6.00)
RDW: 11.9 % (ref 11.5–13.5)
WBC: 9 10*3/uL (ref 4.8–10.8)

## 2020-08-06 LAB — EKG, 12 LEAD, INITIAL
Atrial Rate: 73 ms
Heart Rate: 76 {beats}/min
I-40 Front Axis: 44 deg
I-40 Horizontal Axis: 46 deg
P Duration: 0 ms
P Horizontal Axis: -69 deg
P-R Interval: 168 ms
Q Onset: 499 ms
QRS Axis: -35 deg
QRS Horizontal Axis: -74 deg
QRSD Interval: 109 ms
QT Interval: 373 ms
QTcB: 420 ms
QTcF: 404 ms
RR Interval: 789 ms
S-T Front Axis: 177 deg
S-T Horizontal Axis: 196 deg
T Horizontal Axis: 60 deg
T Wave Axis: 114 deg
T-40 Front Axis: -39 deg
T-40 Horizontal Axis: 250 deg

## 2020-08-06 LAB — METABOLIC PANEL, COMPREHENSIVE
A-G Ratio: 0.8
ALT (SGPT): 27 U/L (ref 3–35)
AST (SGOT): 56 U/L — ABNORMAL HIGH (ref 15–40)
Albumin: 2.9 g/dL — ABNORMAL LOW (ref 3.5–5.0)
Alk. phosphatase: 66 U/L (ref 35–100)
Anion gap: 19 mmol/L
BUN/Creatinine ratio: 20
BUN: 18 MG/DL (ref 7–20)
Bilirubin, total: 1.7 mg/dL — ABNORMAL HIGH (ref 0.10–1.20)
CO2: 22 mmol/L (ref 20–32)
Calcium: 8.4 MG/DL — ABNORMAL LOW (ref 8.8–10.5)
Chloride: 101 mmol/L (ref 100–110)
Creatinine: 0.88 MG/DL (ref 0.40–1.20)
GFR est AA: >60 mL/min/{1.73_m2} (ref >60)
GFR est non-AA: >60 mL/min/{1.73_m2} (ref >60)
Globulin: 3.6 g/dL
Glucose: 98 mg/dL (ref 75–110)
Potassium: 3.6 mmol/L (ref 3.5–5.0)
Protein, total: 6.5 g/dL (ref 6.2–8.0)
Sodium: 138 mmol/L (ref 135–145)

## 2020-08-06 LAB — D DIMER: D DIMER: 3.03 ug/ml(FEU) — ABNORMAL HIGH (ref 0.00–0.49)

## 2020-08-06 LAB — TROPONIN I
Troponin-I, Qt.: 0.04 ng/mL — CR (ref <0.040)
Troponin-I, Qt.: 0.05 ng/mL — CR (ref <0.040)

## 2020-08-06 LAB — LACTIC ACID W/REFLEX: Lactic Acid w/ Reflex: 1.5 MMOL/L (ref 0.5–2.0)

## 2020-08-06 LAB — EKG 12-LEAD
Atrial Rate: 73 ms
ECG QTC Interval: 373 ms
EKG I-40 FRONT AXIS: 44 deg
EKG I-40 HORIZONTAL AXIS: 46 deg
EKG P DURATION: 0 ms
EKG P HORIZONTAL AXIS: -69 deg
EKG Q ONSET: 499 ms
EKG QRS AXIS: -35 deg
EKG QRS HORIZONTAL AXIS: -74 deg
EKG QRSD INTERVAL: 109 ms
EKG QTCB: 420 ms
EKG QTCF: 404 ms
EKG RR INTERVAL: 789 ms
EKG S-T FRONT AXIS: 177 deg
EKG S-T HORIZONTAL AXIS: 196 deg
EKG T HORIZONTAL AXIS: 60 deg
EKG T WAVE AXIS: 114 deg
EKG T-40 FRONT AXIS: -39 deg
EKG T-40 HORIZONTAL AXIS: 250 deg
Heart Rate: 76 {beats}/min
P-R Interval: 168 ms

## 2020-08-06 LAB — COMPREHENSIVE METABOLIC PANEL
ALT: 27 U/L (ref 3–35)
AST: 56 U/L — ABNORMAL HIGH (ref 15–40)
Albumin/Globulin Ratio: 0.8
Albumin: 2.9 g/dL — ABNORMAL LOW (ref 3.5–5.0)
Alkaline Phosphatase: 66 U/L (ref 35–100)
Anion Gap: 19 mmol/L
BUN: 18 MG/DL (ref 7–20)
Bun/Cre Ratio: 20 NA
CO2: 22 mmol/L (ref 20–32)
Calcium: 8.4 MG/DL — ABNORMAL LOW (ref 8.8–10.5)
Chloride: 101 mmol/L (ref 100–110)
Creatinine: 0.88 MG/DL (ref 0.40–1.20)
EGFR IF NonAfrican American: >60 mL/min/{1.73_m2} (ref >60)
GFR African American: >60 mL/min/{1.73_m2} (ref >60)
Globulin: 3.6 g/dL
Glucose: 98 mg/dL (ref 75–110)
Potassium: 3.6 mmol/L (ref 3.5–5.0)
Sodium: 138 mmol/L (ref 135–145)
Total Bilirubin: 1.7 mg/dL — ABNORMAL HIGH (ref 0.10–1.20)
Total Protein: 6.5 g/dL (ref 6.2–8.0)

## 2020-08-06 LAB — CBC WITH AUTO DIFFERENTIAL
Basophils %: 0 %
Basophils Absolute: 0 10*3/uL (ref 0.0–0.2)
Eosinophils %: 0 %
Eosinophils Absolute: 0 10*3/uL (ref 0.0–0.5)
Granulocyte Absolute Count: 0.1 10*3/uL (ref 0.0–0.1)
Hematocrit: 34.1 % — ABNORMAL LOW (ref 42.0–52.0)
Hemoglobin: 11.9 g/dL — ABNORMAL LOW (ref 14.0–18.0)
Immature Granulocytes: 1 %
Lymphocytes %: 8 %
Lymphocytes Absolute: 0.7 10*3/uL — ABNORMAL LOW (ref 1.0–4.5)
MCH: 31.2 PG (ref 28.0–34.0)
MCHC: 34.9 g/dL (ref 32.0–36.0)
MCV: 89.3 FL (ref 80.0–100.0)
MPV: 9.9 FL (ref 7.0–12.0)
Monocytes %: 2 %
Monocytes Absolute: 0.2 10*3/uL (ref 0.1–0.8)
Neutrophils %: 89 %
Neutrophils Absolute: 8 10*3/uL — ABNORMAL HIGH (ref 1.9–7.8)
Nucleated RBCs: 0 PER 100 WBC (ref 0.0–1.0)
Platelets: 160 10*3/uL (ref 150–400)
RBC: 3.82 M/uL — ABNORMAL LOW (ref 4.50–6.00)
RDW: 11.9 % (ref 11.5–13.5)
WBC: 9 10*3/uL (ref 4.8–10.8)

## 2020-08-06 LAB — TROPONIN
Troponin I: 0.04 ng/mL (ref <0.040)
Troponin I: 0.05 ng/mL (ref <0.040)

## 2020-08-06 LAB — LACTIC ACID, REFLEX: Lactic Acid W/ Reflex: 1.5 MMOL/L (ref 0.5–2.0)

## 2020-08-06 LAB — D-DIMER, QUANTITATIVE: D-Dimer, Quant: 3.03 ug/ml(FEU) — ABNORMAL HIGH (ref 0.00–0.49)

## 2020-08-06 MED ORDER — DICYCLOMINE 10 MG CAP
10 mg | Freq: Four times a day (QID) | ORAL | Status: DC | PRN
Start: 2020-08-06 — End: 2020-08-14

## 2020-08-06 MED ORDER — OXYCODONE 5 MG TAB
5 mg | ORAL | Status: DC | PRN
Start: 2020-08-06 — End: 2020-08-14

## 2020-08-06 MED ORDER — SODIUM CHLORIDE 0.9 % IJ SYRG
INTRAMUSCULAR | Status: DC | PRN
Start: 2020-08-06 — End: 2020-08-14

## 2020-08-06 MED ORDER — BARICITINIB 2 MG TABLET
2 mg | Freq: Every day | ORAL | Status: DC
Start: 2020-08-06 — End: 2020-08-14
  Administered 2020-08-06 – 2020-08-14 (×9): via ORAL

## 2020-08-06 MED ORDER — MAGNESIUM CITRATE ORAL SOLN
ORAL | Status: AC
Start: 2020-08-06 — End: 2020-08-06
  Administered 2020-08-07: 01:00:00 via ORAL

## 2020-08-06 MED ORDER — PANTOPRAZOLE 40 MG TAB, DELAYED RELEASE
40 mg | Freq: Every day | ORAL | Status: DC
Start: 2020-08-06 — End: 2020-08-14
  Administered 2020-08-07 – 2020-08-14 (×8): via ORAL

## 2020-08-06 MED ORDER — ONDANSETRON (PF) 4 MG/2 ML INJECTION
4 mg/2 mL | Freq: Four times a day (QID) | INTRAMUSCULAR | Status: DC | PRN
Start: 2020-08-06 — End: 2020-08-14

## 2020-08-06 MED ORDER — ROSUVASTATIN 10 MG TAB
10 mg | Freq: Every evening | ORAL | Status: DC
Start: 2020-08-06 — End: 2020-08-14
  Administered 2020-08-07 – 2020-08-14 (×8): via ORAL

## 2020-08-06 MED ORDER — MAGNESIUM HYDROXIDE 400 MG/5 ML ORAL SUSP
400 mg/5 mL | Freq: Every day | ORAL | Status: DC | PRN
Start: 2020-08-06 — End: 2020-08-14

## 2020-08-06 MED ORDER — INSULIN GLARGINE 100 UNIT/ML (3 ML) SUB-Q PEN
100 unit/mL (3 mL) | Freq: Every day | SUBCUTANEOUS | Status: DC
Start: 2020-08-06 — End: 2020-08-07
  Administered 2020-08-07: 13:00:00 via SUBCUTANEOUS

## 2020-08-06 MED ORDER — IOHEXOL 350 MG IODINE/ML INTRAVENOUS SOLUTION
350 mg iodine/mL | Freq: Once | INTRAVENOUS | Status: AC
Start: 2020-08-06 — End: 2020-08-06
  Administered 2020-08-06: 16:00:00 via INTRAVENOUS

## 2020-08-06 MED ORDER — SODIUM CHLORIDE 0.9 % IJ SYRG
Freq: Two times a day (BID) | INTRAMUSCULAR | Status: DC
Start: 2020-08-06 — End: 2020-08-14
  Administered 2020-08-07 – 2020-08-14 (×16): via INTRAVENOUS

## 2020-08-06 MED ORDER — QUETIAPINE 25 MG TAB
25 mg | Freq: Two times a day (BID) | ORAL | Status: DC
Start: 2020-08-06 — End: 2020-08-14
  Administered 2020-08-07 – 2020-08-14 (×16): via ORAL

## 2020-08-06 MED ORDER — GLUCAGON 1 MG INJECTION
1 mg | INTRAMUSCULAR | Status: DC | PRN
Start: 2020-08-06 — End: 2020-08-14

## 2020-08-06 MED ORDER — DEXTROSE 10% IN WATER (D10W) IV
10 % | INTRAVENOUS | Status: DC | PRN
Start: 2020-08-06 — End: 2020-08-14

## 2020-08-06 MED ORDER — INSULIN GLARGINE 100 UNIT/ML INJECTION
100 unit/mL | Freq: Every evening | SUBCUTANEOUS | Status: DC
Start: 2020-08-06 — End: 2020-08-06

## 2020-08-06 MED ORDER — AMLODIPINE 5 MG TAB
5 mg | Freq: Every day | ORAL | Status: DC
Start: 2020-08-06 — End: 2020-08-14
  Administered 2020-08-07 – 2020-08-14 (×8): via ORAL

## 2020-08-06 MED ORDER — REMDESIVIR 100 MG IV INJECTION
Freq: Once | INTRAVENOUS | Status: AC
Start: 2020-08-06 — End: 2020-08-06
  Administered 2020-08-06: 18:00:00 via INTRAVENOUS

## 2020-08-06 MED ORDER — REMDESIVIR 100 MG IV INJECTION
INTRAVENOUS | Status: AC
Start: 2020-08-06 — End: 2020-08-10
  Administered 2020-08-07 – 2020-08-10 (×4): via INTRAVENOUS

## 2020-08-06 MED ORDER — ACETAMINOPHEN 650 MG RECTAL SUPPOSITORY
650 mg | RECTAL | Status: DC | PRN
Start: 2020-08-06 — End: 2020-08-14

## 2020-08-06 MED ORDER — DEXAMETHASONE SODIUM PHOSPHATE (PF) 10 MG/ML INJECTION
10 mg/mL | Freq: Every day | INTRAMUSCULAR | Status: DC
Start: 2020-08-06 — End: 2020-08-11
  Administered 2020-08-06 – 2020-08-11 (×6): via INTRAVENOUS

## 2020-08-06 MED ORDER — DEXTROSE 40 % ORAL GEL
40 % | ORAL | Status: DC | PRN
Start: 2020-08-06 — End: 2020-08-14

## 2020-08-06 MED ORDER — ACETAMINOPHEN 325 MG TABLET
325 mg | ORAL | Status: DC | PRN
Start: 2020-08-06 — End: 2020-08-14
  Administered 2020-08-07 – 2020-08-11 (×3): via ORAL

## 2020-08-06 MED ORDER — INSULIN ASPART 100 UNIT/ML (3 ML) SUB-Q PEN
100 unit/mL (3 mL) | Freq: Four times a day (QID) | SUBCUTANEOUS | Status: DC
Start: 2020-08-06 — End: 2020-08-14
  Administered 2020-08-07 – 2020-08-14 (×25): via SUBCUTANEOUS

## 2020-08-06 MED ORDER — BISACODYL 10 MG RECTAL SUPPOSITORY
10 mg | Freq: Every day | RECTAL | Status: DC | PRN
Start: 2020-08-06 — End: 2020-08-14

## 2020-08-06 MED ORDER — BISACODYL 5 MG TAB, DELAYED RELEASE
5 mg | Freq: Every day | ORAL | Status: DC | PRN
Start: 2020-08-06 — End: 2020-08-14

## 2020-08-06 MED ORDER — THERAPEUTIC MULTIVITAMIN TAB
Freq: Every day | ORAL | Status: DC
Start: 2020-08-06 — End: 2020-08-14
  Administered 2020-08-07 – 2020-08-14 (×8): via ORAL

## 2020-08-06 MED ORDER — ACETAMINOPHEN (TYLENOL) SOLUTION 32MG/ML
ORAL | Status: DC | PRN
Start: 2020-08-06 — End: 2020-08-14

## 2020-08-06 MED ORDER — DONEPEZIL 5 MG TAB
5 mg | Freq: Every evening | ORAL | Status: DC
Start: 2020-08-06 — End: 2020-08-14
  Administered 2020-08-07 – 2020-08-14 (×8): via ORAL

## 2020-08-06 MED ORDER — GLUCOSE 4 GRAM CHEWABLE TAB
4 gram | ORAL | Status: DC | PRN
Start: 2020-08-06 — End: 2020-08-14

## 2020-08-06 MED ORDER — NALOXONE 0.4 MG/ML INJECTION
0.4 mg/mL | INTRAMUSCULAR | Status: DC | PRN
Start: 2020-08-06 — End: 2020-08-14

## 2020-08-06 MED ORDER — LEVOTHYROXINE 50 MCG TAB
50 mcg | Freq: Every day | ORAL | Status: DC
Start: 2020-08-06 — End: 2020-08-14
  Administered 2020-08-07 – 2020-08-14 (×8): via ORAL

## 2020-08-06 MED ORDER — TAMSULOSIN SR 0.4 MG 24 HR CAP
0.4 mg | Freq: Every day | ORAL | Status: DC
Start: 2020-08-06 — End: 2020-08-14
  Administered 2020-08-07 – 2020-08-14 (×8): via ORAL

## 2020-08-06 MED ORDER — APIXABAN 2.5 MG TABLET
2.5 mg | Freq: Two times a day (BID) | ORAL | Status: DC
Start: 2020-08-06 — End: 2020-08-14
  Administered 2020-08-07 – 2020-08-14 (×16): via ORAL

## 2020-08-06 MED ORDER — DIPHENHYDRAMINE HCL 50 MG/ML IJ SOLN
50 mg/mL | INTRAMUSCULAR | Status: DC | PRN
Start: 2020-08-06 — End: 2020-08-14

## 2020-08-06 MED ORDER — ONDANSETRON 4 MG TAB, RAPID DISSOLVE
4 mg | Freq: Four times a day (QID) | ORAL | Status: DC | PRN
Start: 2020-08-06 — End: 2020-08-14

## 2020-08-06 MED ORDER — MAGNESIUM HYDROXIDE 400 MG/5 ML ORAL SUSP
400 mg/5 mL | Freq: Every evening | ORAL | Status: DC | PRN
Start: 2020-08-06 — End: 2020-08-14

## 2020-08-06 MED ORDER — LISINOPRIL 10 MG TAB
10 mg | Freq: Every day | ORAL | Status: DC
Start: 2020-08-06 — End: 2020-08-14
  Administered 2020-08-07 – 2020-08-14 (×8): via ORAL

## 2020-08-06 MED FILL — DEXAMETHASONE SODIUM PHOSPHATE (PF) 10 MG/ML INJECTION: 10 mg/mL | INTRAMUSCULAR | Qty: 1

## 2020-08-06 MED FILL — OLUMIANT 2 MG TABLET: 2 mg | ORAL | Qty: 2

## 2020-08-06 MED FILL — VEKLURY 100 MG INTRAVENOUS POWDER FOR SOLUTION: 100 mg | INTRAVENOUS | Qty: 100

## 2020-08-06 MED FILL — OMNIPAQUE 350 MG IODINE/ML INTRAVENOUS SOLUTION: 350 mg iodine/mL | INTRAVENOUS | Qty: 90

## 2020-08-06 MED FILL — VEKLURY 100 MG INTRAVENOUS POWDER FOR SOLUTION: 100 mg | INTRAVENOUS | Qty: 200

## 2020-08-06 NOTE — ED Notes (Signed)
Patient taken to Xray

## 2020-08-06 NOTE — ED Notes (Signed)
Remdesivir received from pharmacy, Dr Weston Brass made aware that consent has not been signed for medication.

## 2020-08-06 NOTE — ED Provider Notes (Signed)
History is limited due to the patient's dementia.  Review the triage notes and his last ED visit and admission.    Chief complaint:  Hypoxia.    HPI:  This is a 79 year old male admitted to the hospital approximately 1 week ago for mild DKA.  He was diagnosed with COVID-19 at that time.  By report today he was complaining of some shortness of breath and was found to have an oxygen saturation in the 80s.  He was placed on oxygen at 6 liters/minute and sent to the emergency department.  Here he denies chest pain, shortness of breath, cough, fever, chills, abdominal pain, nausea, vomiting, leg pain, headache, rash, dietary changes, or bowel or bladder changes.  However, the reliability is unclear given his dementia because he denies he has ever had these symptoms.  No further HPI or history available from the patient due to his dementia.           Past Medical History:   Diagnosis Date   ??? Dementia (HCC)    ??? Diabetes (HCC)    ??? Grief     loss of wife 2014   ??? Hypertension        No past surgical history on file.      No family history on file.    Social History     Socioeconomic History   ??? Marital status: SINGLE     Spouse name: Not on file   ??? Number of children: Not on file   ??? Years of education: Not on file   ??? Highest education level: Not on file   Occupational History   ??? Not on file   Tobacco Use   ??? Smoking status: Never Smoker   ??? Smokeless tobacco: Never Used   Substance and Sexual Activity   ??? Alcohol use: Never   ??? Drug use: Never   ??? Sexual activity: Not on file   Other Topics Concern   ??? Not on file   Social History Narrative    Widowed without children. Wife died about 2014    Previous employment as a Runner, broadcasting/film/videoteacher and principal    4 years of college     Social Determinants of Health     Financial Resource Strain:    ??? Difficulty of Paying Living Expenses:    Food Insecurity:    ??? Worried About Programme researcher, broadcasting/film/videounning Out of Food in the Last Year:    ??? Baristaan Out of Food in the Last Year:    Transportation Needs:    ??? Automotive engineerLack of  Transportation (Medical):    ??? Lack of Transportation (Non-Medical):    Physical Activity:    ??? Days of Exercise per Week:    ??? Minutes of Exercise per Session:    Stress:    ??? Feeling of Stress :    Social Connections:    ??? Frequency of Communication with Friends and Family:    ??? Frequency of Social Gatherings with Friends and Family:    ??? Attends Religious Services:    ??? Database administratorActive Member of Clubs or Organizations:    ??? Attends Engineer, structuralClub or Organization Meetings:    ??? Marital Status:    Intimate Programme researcher, broadcasting/film/videoartner Violence:    ??? Fear of Current or Ex-Partner:    ??? Emotionally Abused:    ??? Physically Abused:    ??? Sexually Abused:          ALLERGIES: Shellfish derived    Review of Systems   Unable to perform  ROS: Dementia       Vitals:    08/06/20 0747   BP: (!) 155/59   Pulse: 71   Resp: 18   Temp: 98.4 ??F (36.9 ??C)   SpO2: 94%            Physical Exam  Vitals and nursing note reviewed.   Constitutional:       General: He is not in acute distress.     Appearance: He is well-developed.   HENT:      Head: Normocephalic and atraumatic.   Eyes:      Extraocular Movements: Extraocular movements intact.   Cardiovascular:      Rate and Rhythm: Normal rate and regular rhythm.      Heart sounds: Normal heart sounds.   Pulmonary:      Effort: Pulmonary effort is normal. No tachypnea, bradypnea or respiratory distress.      Breath sounds: Normal breath sounds.   Abdominal:      General: There is no distension.      Palpations: Abdomen is soft.      Tenderness: There is no abdominal tenderness.   Musculoskeletal:         General: Normal range of motion.      Cervical back: Normal range of motion.      Right lower leg: No tenderness. No edema.      Left lower leg: No tenderness. No edema.   Skin:     General: Skin is warm and dry.      Coloration: Skin is not cyanotic or pale.      Findings: No rash.   Neurological:      Mental Status: He is alert. Mental status is at baseline. He is disoriented and confused.      Sensory: Sensation is intact.       Motor: Motor function is intact.   Psychiatric:         Mood and Affect: Mood normal.          MDM  This patient presents to the emergency department within COVID-19 and reported new hypoxia.  Here the patient denies any symptoms.  On 6 liters/minute nasal cannula his oxygen saturation is 100% at the time of my evaluation.  I turned off the oxygen and we will obtain a room air oxygen saturation.  Moving around the bed he does develop mild tachypnea but does not complain of dyspnea.  I have ordered laboratory studies and chest x-ray and the patient will be kept on droplet precautions.  ED Course as of Aug 06 1509   Wed Aug 06, 2020   0350 The patient's chest x-ray shows bilateral infiltrates consistent with viral pneumonitis.  He had been put back on his oxygen before a room air sat was recorded.  We will discontinue oxygen now to see what his room air oxygen saturation is.  If he does not become significantly hypoxic with may be able to treat him with regenerate on and discharge him back to [facility].  Otherwise, he will require admission.    [JB]   0959 The patient's oxygen saturation dropped to 91% on room air.  His troponin is borderline elevated.  His D-dimer is elevated and so I ordered a CT angiogram of the chest.  Given the persistent hypoxia, borderline elevated troponin, and presumed new infiltrates, a consult is placed to the hospitalist service for admission.    [JB]      ED Course User Index  [JB] [place],  Karolee Stamps, MD       Procedures    EKG:  Paced rhythm.    Vital Signs for this visit:  Patient Vitals for the past 12 hrs:   Temp Pulse Resp BP SpO2   08/06/20 1330 ??? 71 ??? (!) 151/70 99 %   08/06/20 1311 ??? ??? ??? ??? 92 %   08/06/20 1310 ??? ??? ??? ??? (!) 88 %   08/06/20 1259 ??? 80 17 (!) 150/73 91 %   08/06/20 1230 ??? 64 27 (!) 151/62 95 %   08/06/20 1200 ??? 64 25 (!) 140/65 97 %   08/06/20 1100 ??? 74 10 (!) 146/64 93 %   08/06/20 1030 ??? 79 (!) 7 134/68 92 %   08/06/20 0900 ??? (!) 59 11 (!) 128/50 95 %    08/06/20 0813 ??? ??? ??? ??? 94 %   08/06/20 0747 98.4 ??F (36.9 ??C) 71 18 (!) 155/59 94 %       Lab findings during this visit (only abnormal values will be noted, if no value noted then the result was normal range):  Labs Reviewed   CBC WITH AUTOMATED DIFF - Abnormal; Notable for the following components:       Result Value    RBC 3.82 (*)     HGB 11.9 (*)     HCT 34.1 (*)     ABS. LYMPHOCYTES 0.7 (*)     ABS. NEUTROPHILS 8.0 (*)     All other components within normal limits   METABOLIC PANEL, COMPREHENSIVE - Abnormal; Notable for the following components:    Calcium 8.4 (*)     Bilirubin, total 1.70 (*)     AST (SGOT) 56 (*)     Albumin 2.9 (*)     All other components within normal limits   TROPONIN I - Abnormal; Notable for the following components:    Troponin-I, Qt. 0.050 (*)     All other components within normal limits   D DIMER - Abnormal; Notable for the following components:    D DIMER 3.03 (*)     All other components within normal limits   TROPONIN I - Abnormal; Notable for the following components:    Troponin-I, Qt. 0.040 (*)     All other components within normal limits   CULTURE, BLOOD   CULTURE, BLOOD,  2ND DRAW   LACTIC ACID W/REFLEX       Radiology studies during this visit  XR CHEST PA LAT    Result Date: 08/06/2020  New appearance of ground-glass opacities involving the bilateral lungs.  The appearance would be consistent with an atypical infection, but in the acute setting could also be seen secondary to pulmonary edema or pulmonary hemorrhage.  Viral pneumonia could certainly have this appearance.     CTA CHEST W OR W WO CONT    Result Date: 08/06/2020  1. The lung abnormalities these may be related to COVID infection but deserves outpatient imaging follow-up to ascertain stability or resolution. 2. No evidence of acute pulmonary embolism. 3. Lymph nodes are likely reactive. 4. 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 5. Borderline cardiomegaly. 6. Small hiatal  hernia. 7. Left nephrolithiasis. 8. Few other findings detailed above.       Medications given in the ED:  Medications   remdesivir 200 mg in 0.9% sodium chloride 250 mL IVPB (200 mg IntraVENous New Bag 08/06/20 1412)     Followed by   remdesivir 100 mg  in 0.9% sodium chloride 250 mL IVPB (has no administration in time range)   baricitinib (OLUMIANT) tablet 4 mg (4 mg Oral Given 08/06/20 1108)   dexamethasone (PF) (DECADRON) 10 mg/mL injection 6 mg (6 mg IntraVENous Given 08/06/20 1108)   iohexoL (OMNIPAQUE) 350 mg iodine/mL contrast injection 90 mL (70 mL IntraVENous Given 08/06/20 1135)       Diagnosis:    ICD-10-CM ICD-9-CM   1. COVID-19  U07.1 079.89   2. Viral pneumonia  J12.9 480.9   3. Elevated troponin  R77.8 790.6   4. Hypoxia  R09.02 799.02       Condition at disposition:  Condition stable    Disposition:  Admitted    Discharge prescriptions and/or changes if applicable:  Current Discharge Medication List          Follow-up:  No follow-up provider specified.    Please note that portions of this document were created using the M*Modal Fluency Direct dictation system.  Any inconsistencies or typographical errors may be the result of mis-transcription that persist in spite of proof-reading and should be addressed with the document creator.

## 2020-08-06 NOTE — ED Notes (Signed)
Pt. Was sitting on edge of bed attempting to stand for urinal use.  Pt's oxygen was in the 80's on 2 liters. I assisted him back to bed. He is very weak and some confusion.  He is now 92% on 4 liters via NC.  Pt. Lying in bed his RN laura is aware and will be applying a bed alarm unit for safety as his door is shut due to covid precautions.

## 2020-08-06 NOTE — ED Notes (Signed)
Comes from [facility], Staff states O2 was mid 80s on RA, placed on 6L for 94%. Hx of dementia.

## 2020-08-06 NOTE — H&P (Signed)
H&P by Yves Dill, MD at 08/06/20 1754                Author: Yves Dill, MD  Service: Hospitalist  Author Type: Physician       Filed: 08/06/20 1818  Date of Service: 08/06/20 1754  Status: Signed          Editor: Yves Dill, MD (Physician)                                  History and Physical        Subjective:        Alejandro [name] is a 79 y.o. male who presents with a diagnosis of COVID first detected on 07/29/20. He did not meet hospitalization criteria and he returned to his  supervised residence. He has advancing dementia and healthcare decisions are governed by his guardians, Altha Harm and Aaron Edelman (niece and nephew). Patient was noted to have a progressive worsening in his breathing with shortness of breath. He denies nausea  and vomiting and diarrhea. He feels short of breath which is aggravated by exertion and improves with rest. Previous studies include a positive COVID testing on 07/29/20 and a chest x-ray from 07/29/20 with no infiltrates.       Advanced Directive: Discussed with [name], one of patient's guardians, pt had done a living will when he was of sound mind and advised no heroic measures and no resuscitation in event of cardiac or pulmonary arrest. Code Status is DNR.           Past Medical History:        Diagnosis  Date         ?  Dementia ([place])       ?  Diabetes ([place])       ?  Grief            loss of wife 2014         ?  Hypertension           History reviewed. No pertinent surgical history.   No family history on file.      Social History          Tobacco Use         ?  Smoking status:  Never Smoker     ?  Smokeless tobacco:  Never Used       Substance Use Topics         ?  Alcohol use:  Never            Prior to Admission medications             Medication  Sig  Start Date  End Date  Taking?  Authorizing Provider            bisacodyL (DULCOLAX) 10 mg supp  Insert 10 mg into rectum daily as needed for Constipation.        Provider, Historical             ibuprofen (MOTRIN) 400 mg tablet  Take 400 mg by mouth every six (6) hours as needed for Pain.        Provider, Historical            glucose blood VI test strips (Prodigy No Coding) strip  USE 1 STRIP TO TEST BLOOD GLUCOSE 4 TIMES DAILY.  07/28/20      Duayne Cal,  FNP     insulin glargine (Basaglar KwikPen U-100 Insulin) 100 unit/mL (3 mL) inpn  Inject 38 units Am and 10 units PM subq E10.65  Indications: type 1 diabetes mellitus  07/23/20      Sparlin, Sheryl A, FNP     Eliquis 2.5 mg tablet  TAKE 1 TABLET BY MOUTH TWICE DAILY.  07/21/20      Duayne Cal, FNP     rosuvastatin (CRESTOR) 10 mg tablet  Take 1 Tablet by mouth nightly.  07/03/20      Duayne Cal, FNP     insulin aspart U-100 (NOVOLOG) 100 unit/mL (3 mL) inpn  Inject 7 units breakfast, lunch, supper plus correction (3 units for every 30 points over 150 to max of 9 extra units)  06/10/20      Sparlin, Sheryl A, FNP     pen needle,diabetic dual safty (BD AutoShield Duo Pen Needle) 30 gauge x 3/16" ndle  For use with insulin pens 5 times daily  06/10/20      Sparlin, Sheryl A, FNP     levothyroxine (SYNTHROID) 50 mcg tablet  Take 1 Tablet by mouth Daily (before breakfast).  05/15/20      Duayne Cal, FNP     pantoprazole (PROTONIX) 40 mg tablet  Take 1 Tablet by mouth daily.  05/15/20      Duayne Cal, FNP     tamsulosin (Flomax) 0.4 mg capsule  Take 1 Capsule by mouth daily.  05/14/20      Duayne Cal, FNP     multivitamin (ONE A DAY) tablet  Take 1 Tablet by mouth daily.  05/14/20      Duayne Cal, FNP     OTHER  Monitor Blood Glucose 4 times daily, before meals and at [facility]  05/14/20      Riker, Neomia Dear, MD     QUEtiapine (SEROquel) 50 mg tablet  Take 1 Tablet by mouth two (2) times a day.  05/06/20      Duayne Cal, FNP     lancets misc  Use one lancet to test blood glucose 4 times daily  05/06/20      Duayne Cal, FNP     Insulin Syringe-Needle U-100 0.3 mL 30 syrg  1 Each by SubCUTAneous route four (4) times daily.  05/06/20      Duayne Cal, FNP     amLODIPine (NORVASC)  10 mg tablet  Take 1 Tab by mouth daily. Indications: high blood pressure  03/18/20      Duayne Cal, FNP     acetaminophen (TYLENOL) 325 mg tablet  Take 650 mg by mouth every four (4) hours as needed for Pain or Fever (tamp > 100).        Provider, Historical     magnesium citrate solution  Take 296 mL by mouth now. Prn if no results after enema. If no results in 1 hour of completion of bowl protocol, call MD immediately for further  orders.         Provider, Historical     magnesium hydroxide (Phillips Milk of Magnesia) 400 mg/5 mL suspension  Take 30 mL by mouth nightly as needed for Constipation (If no BM for 3 days). Prn if no BM in 3 days        Provider, Historical     benazepriL (LOTENSIN) 5 mg tablet  Take 10 mg by mouth daily.        Provider, Historical     cetirizine (ZYRTEC) 10 mg tablet  Take 10 mg by mouth daily.        Provider, Historical     donepeziL (ARICEPT) 5 mg tablet  Take 5 mg by mouth nightly.        Provider, Historical            glucagon (GlucaGen HypoKit) 1 mg injection  1 mg by IntraVENous route once.        Provider, Historical          Allergies        Allergen  Reactions         ?  Shellfish Derived  Anaphylaxis            Review of Systems:   Review of Systems    Constitutional: Negative for chills and fever.    HENT: Negative for hearing loss and sore throat.     Eyes: Negative for blurred vision and redness.    Respiratory: Positive for cough. Negative for sputum production.     Cardiovascular: Negative for chest pain, palpitations and leg swelling.    Gastrointestinal: Negative for abdominal pain, diarrhea, heartburn, nausea and vomiting.    Genitourinary: Negative for dysuria.    Musculoskeletal: Negative for myalgias.    Skin: Negative for rash.    Neurological: Negative for dizziness, focal weakness and headaches.    Endo/Heme/Allergies: Does not bruise/bleed easily.    Psychiatric/Behavioral: Positive for memory loss.             Objective:        Intake and Output:     No  intake/output data recorded.   No intake/output data recorded.      Physical Exam:    Physical Exam   Vitals reviewed.    Constitutional:        Appearance: He is normal weight.   HENT :       Head: Normocephalic and atraumatic.      Right Ear: External ear normal.      Left Ear: External ear normal.      Nose: Nose normal.      Mouth/Throat:      Mouth: Mucous membranes are moist.   Eyes:       Conjunctiva/sclera: Conjunctivae normal.      Pupils: Pupils are equal, round, and reactive to light.   Cardiovascular:       Rate and Rhythm: Normal rate and regular rhythm.      Pulses: Normal pulses.      Heart sounds: Normal heart sounds.    Pulmonary:       Effort: Pulmonary effort is normal.      Breath sounds: Normal breath sounds.   Abdominal :      General: Abdomen is flat.      Palpations: Abdomen is soft.     Musculoskeletal:          General: Normal range of motion.      Cervical back: Normal range of motion and neck supple.    Skin:      General: Skin is warm.      Coloration: Skin is not jaundiced or pale.    Neurological:       General: No focal deficit present.      Mental Status: He is alert and oriented to person, place, and time. Mental status is at baseline.   Psychiatric:         Mood and Affect: Mood normal.         Behavior: Behavior normal.  Thought Content:  Thought content normal.              ECG:  Sinus rhythm      Data Review:      Recent Results (from the past 24 hour(s))     EKG, 12 LEAD, INITIAL          Collection Time: 08/06/20  7:47 AM         Result  Value  Ref Range            Heart Rate  76  bpm       RR Interval  789  ms       Atrial Rate  73  ms       P-R Interval  168  ms       P Duration  0  ms       P Horizontal Axis  -69  deg       P Front Axis    deg       Q Onset  499  ms       QRSD Interval  109  ms       QT Interval  373  ms       QTcB  420  ms       QTcF  404  ms       QRS Horizontal Axis  -74  deg       QRS Axis  -35  deg       I-40 Horizontal Axis  46  deg       I-40  Front Axis  44  deg       T-40 Horizontal Axis  250  deg       T-40 Front Axis  -39  deg       T Horizontal Axis  60  deg       T Wave Axis  114  deg       S-T Horizontal Axis  196  deg       S-T Front Axis  177  deg       CBC WITH AUTOMATED DIFF          Collection Time: 08/06/20  8:10 AM         Result  Value  Ref Range            WBC  9.0  4.8 - 10.8 K/uL       RBC  3.82 (L)  4.50 - 6.00 M/uL       HGB  11.9 (L)  14.0 - 18.0 g/dL       HCT  34.1 (L)  42.0 - 52.0 %            MCV  89.3  80.0 - 100.0 FL            MCH  31.2  28.0 - 34.0 PG       MCHC  34.9  32.0 - 36.0 g/dL       RDW  11.9  11.5 - 13.5 %       PLATELET  160  150 - 400 K/uL       MPV  9.9  7.0 - 12.0 FL       NEUTROPHILS  89  %       LYMPHOCYTES  8  %       MONOCYTES  2  %       EOSINOPHILS  0  %  BASOPHILS  0  %       NRBC  0.0  0.0 - 1.0 PER 100 WBC       ABS. LYMPHOCYTES  0.7 (L)  1.0 - 4.5 K/UL       ABS. MONOCYTES  0.2  0.1 - 0.8 K/UL       ABS. BASOPHILS  0.0  0.0 - 0.2 K/UL       ABS. EOSINOPHILS  0.0  0.0 - 0.5 K/UL       IMMATURE GRANULOCYTES  1  %       ABS. NEUTROPHILS  8.0 (H)  1.9 - 7.8 K/UL       ABS. IMM. GRANS.  0.1  0.0 - 0.1 K/UL       DF  AUTOMATED         METABOLIC PANEL, COMPREHENSIVE          Collection Time: 08/06/20  8:10 AM         Result  Value  Ref Range            Sodium  138  135 - 145 mmol/L       Potassium  3.6  3.5 - 5.0 mmol/L       Chloride  101  100 - 110 mmol/L       CO2  22  20 - 32 mmol/L       Anion gap  19  mmol/L       Glucose  98  75 - 110 mg/dL       BUN  18  7 - 20 MG/DL       Creatinine  0.88  0.40 - 1.20 MG/DL       BUN/Creatinine ratio  20         GFR est AA  >60  >60 ml/min/1.56m            GFR est non-AA  >60  >60 ml/min/1.728m           Calcium  8.4 (L)  8.8 - 10.5 MG/DL       Bilirubin, total  1.70 (H)  0.10 - 1.20 mg/dL       ALT (SGPT)  27  3 - 35 U/L       AST (SGOT)  56 (H)  15 - 40 U/L       Alk. phosphatase  66  35 - 100 U/L       Protein, total  6.5  6.2 - 8.0 g/dL       Albumin  2.9  (L)  3.5 - 5.0 g/dL       Globulin  3.6  g/dL       A-G Ratio  0.8          LACTIC ACID W/REFLEX          Collection Time: 08/06/20  8:10 AM         Result  Value  Ref Range            Lactic Acid w/ Reflex  1.5  0.5 - 2.0 MMOL/L       D DIMER          Collection Time: 08/06/20  8:48 AM         Result  Value  Ref Range            D DIMER  3.03 (H)  0.00 - 0.49 ug/ml(FEU)       TROPONIN I  Collection Time: 08/06/20  8:48 AM         Result  Value  Ref Range            Troponin-I, Qt.  0.040 (HH)  <0.040 ng/mL       TROPONIN I          Collection Time: 08/06/20 11:08 AM         Result  Value  Ref Range            Troponin-I, Qt.  0.050 (HH)  <0.040 ng/mL            XR CHEST PA LAT      Result Date: 08/06/2020   New appearance of ground-glass opacities involving the bilateral lungs.  The appearance would be consistent with an atypical infection, but in the acute setting could also be seen secondary to pulmonary edema or pulmonary hemorrhage.  Viral pneumonia  could certainly have this appearance.       CTA CHEST W OR W WO CONT      Result Date: 08/06/2020   1. The lung abnormalities these may be related to COVID infection but deserves outpatient imaging follow-up to ascertain stability or resolution. 2. No evidence of acute pulmonary embolism. 3. Lymph nodes are likely reactive. 4. Suspect left subclavian  vein chronic occlusion versus high-grade stenosis. 5. Borderline cardiomegaly. 6. Small hiatal hernia. 7. Left nephrolithiasis. 8. Few other findings detailed above.            Assessment:        Active Problems:     Dementia ([place]) (08/06/2020)        Acute hypoxemic respiratory failure due to COVID-19 [facility]) (08/06/2020)       Diabetes mellitus, type 1    Hypertension    History of CVA    Cardiac pacemaker            Plan:        Supportive care   Baricitinib day 1/14   Remdesivir 1/5   Decadron 1/10   Oxygen to maintain sats 92%   COVID precautions for several days   Long and short acting insulin   Home  medications continued   Discussed with Guardian [name]         Signed By:  Yves Dill, MD           August 06, 2020

## 2020-08-07 LAB — METABOLIC PANEL, COMPREHENSIVE
A-G Ratio: 0.7
ALT (SGPT): 25 U/L (ref 3–35)
AST (SGOT): 34 U/L (ref 15–40)
Albumin: 2.7 g/dL — ABNORMAL LOW (ref 3.5–5.0)
Alk. phosphatase: 61 U/L (ref 35–100)
Anion gap: 22 mmol/L
BUN/Creatinine ratio: 29
BUN: 30 MG/DL — ABNORMAL HIGH (ref 7–20)
Bilirubin, total: 1.8 mg/dL — ABNORMAL HIGH (ref 0.10–1.20)
CO2: 15 mmol/L — ABNORMAL LOW (ref 20–32)
Calcium: 8.4 MG/DL — ABNORMAL LOW (ref 8.8–10.5)
Chloride: 101 mmol/L (ref 100–110)
Creatinine: 1.04 MG/DL (ref 0.40–1.20)
GFR est AA: >60 mL/min/{1.73_m2} (ref >60)
GFR est non-AA: >60 mL/min/{1.73_m2} (ref >60)
Globulin: 3.9 g/dL
Glucose: 412 mg/dL — ABNORMAL HIGH (ref 75–110)
Potassium: 4.3 mmol/L (ref 3.5–5.0)
Protein, total: 6.6 g/dL (ref 6.2–8.0)
Sodium: 134 mmol/L — ABNORMAL LOW (ref 135–145)

## 2020-08-07 LAB — GLUCOSE, POC
Glucose (POC): 362 mg/dL — ABNORMAL HIGH (ref 75–110)
Glucose (POC): 391 mg/dL — ABNORMAL HIGH (ref 75–110)
Glucose (POC): 388 mg/dL — ABNORMAL HIGH (ref 75–110)
Glucose (POC): 331 mg/dL — ABNORMAL HIGH (ref 75–110)

## 2020-08-07 LAB — COMPREHENSIVE METABOLIC PANEL
ALT: 25 U/L (ref 3–35)
AST: 34 U/L (ref 15–40)
Albumin/Globulin Ratio: 0.7
Albumin: 2.7 g/dL — ABNORMAL LOW (ref 3.5–5.0)
Alkaline Phosphatase: 61 U/L (ref 35–100)
Anion Gap: 22 mmol/L
BUN: 30 MG/DL — ABNORMAL HIGH (ref 7–20)
Bun/Cre Ratio: 29 NA
CO2: 15 mmol/L — ABNORMAL LOW (ref 20–32)
Calcium: 8.4 MG/DL — ABNORMAL LOW (ref 8.8–10.5)
Chloride: 101 mmol/L (ref 100–110)
Creatinine: 1.04 MG/DL (ref 0.40–1.20)
EGFR IF NonAfrican American: >60 mL/min/{1.73_m2} (ref >60)
GFR African American: >60 mL/min/{1.73_m2} (ref >60)
Globulin: 3.9 g/dL
Glucose: 412 mg/dL — ABNORMAL HIGH (ref 75–110)
Potassium: 4.3 mmol/L (ref 3.5–5.0)
Sodium: 134 mmol/L — ABNORMAL LOW (ref 135–145)
Total Bilirubin: 1.8 mg/dL — ABNORMAL HIGH (ref 0.10–1.20)
Total Protein: 6.6 g/dL (ref 6.2–8.0)

## 2020-08-07 LAB — POCT GLUCOSE
POC Glucose: 362 mg/dL — ABNORMAL HIGH (ref 75–110)
POC Glucose: 391 mg/dL — ABNORMAL HIGH (ref 75–110)
POC Glucose: 388 mg/dL — ABNORMAL HIGH (ref 75–110)
POC Glucose: 331 mg/dL — ABNORMAL HIGH (ref 75–110)

## 2020-08-07 MED ORDER — INSULIN GLARGINE 100 UNIT/ML (3 ML) SUB-Q PEN
100 unit/mL (3 mL) | Freq: Every evening | SUBCUTANEOUS | Status: DC
Start: 2020-08-07 — End: 2020-08-07
  Administered 2020-08-07: 01:00:00 via SUBCUTANEOUS

## 2020-08-07 MED FILL — LISINOPRIL 10 MG TAB: 10 mg | ORAL | Qty: 1

## 2020-08-07 MED FILL — ACETAMINOPHEN 325 MG TABLET: 325 mg | ORAL | Qty: 2

## 2020-08-07 MED FILL — LANTUS U-100 INSULIN 100 UNIT/ML SUBCUTANEOUS SOLUTION: 100 unit/mL | SUBCUTANEOUS | Qty: 0.08

## 2020-08-07 MED FILL — PANTOPRAZOLE 40 MG TAB, DELAYED RELEASE: 40 mg | ORAL | Qty: 1

## 2020-08-07 MED FILL — QUETIAPINE 25 MG TAB: 25 mg | ORAL | Qty: 2

## 2020-08-07 MED FILL — ELIQUIS 2.5 MG TABLET: 2.5 mg | ORAL | Qty: 1

## 2020-08-07 MED FILL — THERAPEUTIC MULTIVITAMIN TAB: ORAL | Qty: 1

## 2020-08-07 MED FILL — BD POSIFLUSH NORMAL SALINE 0.9 % INJECTION SYRINGE: INTRAMUSCULAR | Qty: 10

## 2020-08-07 MED FILL — OLUMIANT 2 MG TABLET: 2 mg | ORAL | Qty: 2

## 2020-08-07 MED FILL — ROSUVASTATIN 10 MG TAB: 10 mg | ORAL | Qty: 1

## 2020-08-07 MED FILL — AMLODIPINE 5 MG TAB: 5 mg | ORAL | Qty: 2

## 2020-08-07 MED FILL — CITROMA ORAL SOLUTION: ORAL | Qty: 296

## 2020-08-07 MED FILL — VEKLURY 100 MG INTRAVENOUS POWDER FOR SOLUTION: 100 mg | INTRAVENOUS | Qty: 100

## 2020-08-07 MED FILL — NOVOLOG FLEXPEN U-100 INSULIN ASPART 100 UNIT/ML (3 ML) SUBCUTANEOUS: 100 unit/mL (3 mL) | SUBCUTANEOUS | Qty: 3

## 2020-08-07 MED FILL — DEXAMETHASONE SODIUM PHOSPHATE (PF) 10 MG/ML INJECTION: 10 mg/mL | INTRAMUSCULAR | Qty: 1

## 2020-08-07 MED FILL — LEVOTHYROXINE 50 MCG TAB: 50 mcg | ORAL | Qty: 1

## 2020-08-07 MED FILL — DONEPEZIL 5 MG TAB: 5 mg | ORAL | Qty: 1

## 2020-08-07 MED FILL — TAMSULOSIN SR 0.4 MG 24 HR CAP: 0.4 mg | ORAL | Qty: 1

## 2020-08-07 MED FILL — SEMGLEE PEN U-100 INSULIN 100 UNIT/ML (3 ML) SUBCUTANEOUS: 100 unit/mL (3 mL) | SUBCUTANEOUS | Qty: 3

## 2020-08-07 NOTE — Assessment & Plan Note (Signed)
No recurrence thus far

## 2020-08-07 NOTE — Assessment & Plan Note (Signed)
Oxygen needs have not escalated, 2-4 L/min  Continue current treatment plan for COVID  5/10, Decadron  5/14, Baricitinib  Remdesivir completed on 10/3  Maintain oxygen saturation around 92%  Pt required 15L/min when active with PT

## 2020-08-07 NOTE — Progress Notes (Signed)
 Date of admission: 08/06/20       Admission Diagnosis: acute hypoxemic respiratory failure due to COVID 19       New diagnoses:       Readmission:yes       Number of days between admissions: 7       Previous Admission diagnosis:DKA        Reason for readmission (not patient stated):oxygen needs / 4 l/min       Did patient see pcp prior to admission? No ,  If no, why not?PCP was notified of discharge .        Did patient decline hhs or SNF prior admission?  Lives group home .          For patient 49 and older ask: What matters most to you (regarding their care/health)-     Patient own guardian and can make their own decisions? {yes/ no default yes:1940  Caregiver and/or guardian contact:  Please obtain copy of guardian papers           Medications:         Can patient afford new/current medications yes       Does patient require medication assistance? yes       Date referred/arranged       Pharmacist Referral yes         Wound care/IV       Caregiver needed?        Ask caregiver to demonstrate prior to discharge         Date this occurred:            PT/OT/RT       Level of function prior to admission         Current level of function:       Assessment/recommendations                               RT Assessment/recommendations.       DME equipment ordered:       When will it arrive:           Discharge Planning     Are there any concerns about discharge? (Ask patient)      "Let's talk about what to do if you think you're feeling worse."       Location after discharge:     Follow-up appointments:     Discharge planning with community CM:          HHS     Currently open?      Agency/date of outreach     New referral?      Date of referral:       Referral to Pulmonary Rehab      Referral to [facility]      [facility]erral to [facility]         Notes:

## 2020-08-07 NOTE — Progress Notes (Signed)
Hospitalist Progress Note    Subjective:   Daily Progress Note: 08/07/2020 8:39 PM    Chief Complaint:     Pt with a lot of questions which he repeats and does not retain information provided. His questions are on point.    Current Facility-Administered Medications   Medication Dose Route Frequency   ??? insulin glargine (LANTUS,BASAGLAR) pen 12 Units  12 Units SubCUTAneous QHS   ??? [START ON 08/08/2020] insulin glargine (LANTUS,BASAGLAR) pen 40 Units  40 Units SubCUTAneous DAILY   ??? remdesivir 100 mg in 0.9% sodium chloride 250 mL IVPB  100 mg IntraVENous Q24H   ??? baricitinib (OLUMIANT) tablet 4 mg  4 mg Oral DAILY   ??? dexamethasone (PF) (DECADRON) 10 mg/mL injection 6 mg  6 mg IntraVENous DAILY   ??? amLODIPine (NORVASC) tablet 10 mg  10 mg Oral DAILY   ??? lisinopriL (PRINIVIL, ZESTRIL) tablet 10 mg  10 mg Oral DAILY   ??? bisacodyL (DULCOLAX) suppository 10 mg  10 mg Rectal DAILY PRN   ??? donepeziL (ARICEPT) tablet 5 mg  5 mg Oral QHS   ??? apixaban (ELIQUIS) tablet 2.5 mg  2.5 mg Oral BID   ??? glucose chewable tablet 16 g  4 Tablet Oral PRN   ??? dextrose 40% (GLUTOSE) oral gel 1 Tube  1 Tube Oral PRN   ??? glucagon (GLUCAGEN) injection 1 mg  1 mg IntraMUSCular PRN   ??? dextrose 10% infusion 250 mL  250 mL IntraVENous PRN   ??? insulin aspart U-100 (NOVOLOG) pen   SubCUTAneous AC&HS   ??? levothyroxine (SYNTHROID) tablet 50 mcg  50 mcg Oral ACB   ??? magnesium hydroxide (MILK OF MAGNESIA) 400 mg/5 mL oral suspension 30 mL  30 mL Oral QHS PRN   ??? therapeutic multivitamin (THERAGRAN) tablet 1 Tablet  1 Tablet Oral DAILY   ??? pantoprazole (PROTONIX) tablet 40 mg  40 mg Oral DAILY   ??? QUEtiapine (SEROquel) tablet 50 mg  50 mg Oral BID   ??? rosuvastatin (CRESTOR) tablet 10 mg  10 mg Oral QHS   ??? tamsulosin (FLOMAX) capsule 0.4 mg  0.4 mg Oral DAILY   ??? sodium chloride (NS) flush 3-10 mL  3-10 mL IntraVENous Q12H   ??? sodium chloride (NS) flush 3-10 mL  3-10 mL IntraVENous PRN   ??? acetaminophen (TYLENOL) tablet 650 mg  650 mg Oral Q4H PRN    Or    ??? acetaminophen (TYLENOL) solution 650 mg  650 mg Oral Q4H PRN    Or   ??? acetaminophen (TYLENOL) suppository 650 mg  650 mg Rectal Q4H PRN   ??? oxyCODONE IR (ROXICODONE) tablet 5 mg  5 mg Oral Q4H PRN   ??? naloxone (NARCAN) injection 0.4 mg  0.4 mg IntraVENous PRN   ??? dicyclomine (BENTYL) capsule 10 mg  10 mg Oral Q6H PRN   ??? diphenhydrAMINE (BENADRYL) injection 12.5 mg  12.5 mg IntraVENous Q4H PRN   ??? ondansetron (ZOFRAN) injection 4 mg  4 mg IntraVENous Q6H PRN    Or   ??? ondansetron (ZOFRAN ODT) tablet 4 mg  4 mg Oral Q6H PRN   ??? magnesium hydroxide (MILK OF MAGNESIA) 400 mg/5 mL oral suspension 30 mL  30 mL Oral DAILY PRN   ??? bisacodyL (DULCOLAX) tablet 10 mg  10 mg Oral DAILY PRN        Review of Systems  Review of Systems   Constitutional: Negative for chills and fever.   HENT: Negative for hearing loss and  sore throat.    Eyes: Negative for blurred vision and redness.   Respiratory: Positive for cough. Negative for sputum production and shortness of breath.    Cardiovascular: Negative for chest pain and leg swelling.   Gastrointestinal: Negative for abdominal pain.   Genitourinary: Negative for dysuria.   Musculoskeletal: Negative for myalgias.   Skin: Negative for rash.   Neurological: Negative for dizziness, speech change, focal weakness and headaches.   Endo/Heme/Allergies: Does not bruise/bleed easily.   Psychiatric/Behavioral: Positive for memory loss.       Objective:     Visit Vitals  BP 128/61 (BP 1 Location: Right upper arm, BP Patient Position: At rest)   Pulse (!) 58   Temp 98 ??F (36.7 ??C)   Resp 18   SpO2 95%    O2 Flow Rate (L/min): 3 l/min O2 Device: Nasal cannula    Temp (24hrs), Avg:97.8 ??F (36.6 ??C), Min:97.5 ??F (36.4 ??C), Max:98.2 ??F (36.8 ??C)      No intake/output data recorded.  09/29 0701 - 09/30 1900  In: 700 [P.O.:200; I.V.:500]  Out: 1400 [Urine:1400]    Physical Exam:  Constitutional: Alert and appropriate, not toxic appearing  HEENT: moist mucus membranes, no scleral icterus, no  cervical lymphadenopathy, supple  Respiratory: respirations even with no distress, good air movement, no rales or ronchi  Cardiovascular: RRR, nl S1S2, physiologically split S4, no murmurs, rubs or gallops, no jugular venous distention  Abdominal Exam: soft without guarding, bowel sounds present, organs difficult to discern, no masses  Extremities: no edema, no clubbing, no cyanosis  Neurologic: no focal changes, moves all extremities  Psychiatric: no suicidal ideation, mood is normal  Musculoskeletal: no acute synovitis, appropriate range of motion (ROM)      Data Review     Recent Results (from the past 24 hour(s))   GLUCOSE, POC    Collection Time: 08/06/20  9:04 PM   Result Value Ref Range    Glucose (POC) 362 (H) 75 - 110 mg/dL    Performed by Pryor Montes    METABOLIC PANEL, COMPREHENSIVE    Collection Time: 08/07/20  6:29 AM   Result Value Ref Range    Sodium 134 (L) 135 - 145 mmol/L    Potassium 4.3 3.5 - 5.0 mmol/L    Chloride 101 100 - 110 mmol/L    CO2 15 (L) 20 - 32 mmol/L    Anion gap 22 mmol/L    Glucose 412 (H) 75 - 110 mg/dL    BUN 30 (H) 7 - 20 MG/DL    Creatinine 1.04 0.40 - 1.20 MG/DL    BUN/Creatinine ratio 29     GFR est AA >60 >60 ml/min/1.49m    GFR est non-AA >60 >60 ml/min/1.723m   Calcium 8.4 (L) 8.8 - 10.5 MG/DL    Bilirubin, total 1.80 (H) 0.10 - 1.20 mg/dL    ALT (SGPT) 25 3 - 35 U/L    AST (SGOT) 34 15 - 40 U/L    Alk. phosphatase 61 35 - 100 U/L    Protein, total 6.6 6.2 - 8.0 g/dL    Albumin 2.7 (L) 3.5 - 5.0 g/dL    Globulin 3.9 g/dL    A-G Ratio 0.7     GLUCOSE, POC    Collection Time: 08/07/20  8:10 AM   Result Value Ref Range    Glucose (POC) 391 (H) 75 - 110 mg/dL    Performed by ClLedell Peoples  GLUCOSE, POC  Collection Time: 08/07/20 12:04 PM   Result Value Ref Range    Glucose (POC) 388 (H) 75 - 110 mg/dL    Performed by Ledell Peoples    GLUCOSE, POC    Collection Time: 08/07/20  4:47 PM   Result Value Ref Range    Glucose (POC) 331 (H) 75 - 110 mg/dL    Performed by  Ledell Peoples        Reviewed notes from previous day   Reviewed new clinical lab tests from today    Reviewed imaging independently ??? CXR imaging     Assessment/Plan:     Acute hypoxemic respiratory failure due to COVID-19 [facility])  Oxygen needs have not escalated, initially on 4L, now on 3L/min  Continue current treatment plan for COVID  2/10, Decadron  2/14, Baricitinib  2/5, Remdesiver  Maintain oxygen saturation around 92%    Dementia ([place])  I spoke with pt's guardian Altha Harm on admission and on 08/07/20; she is aware of the treatment plan and approves    Type 1 diabetes mellitus with hyperglycemia, with long-term current use of insulin (HCC)  BID Lantus with sliding scale, hyperglycemia due to Decadron, Lantus dose increased    Generalized muscle weakness  PT and OT consulted    Hypo-osmolality and hyponatremia  No recurrence thus far    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  Plan was also discussed with guardian, Altha Harm by phone tonight.    I spent 35 minutes of which greater than 50% was spent face-to-face counseling and/or coordinating care to discuss the diagnosis, treatment and management options for patient today.    Signed By: Yves Dill, MD     August 07, 2020 8:39 PM

## 2020-08-07 NOTE — Assessment & Plan Note (Signed)
Stable with pacing, continue outpatient battery checks and interrogations

## 2020-08-07 NOTE — Progress Notes (Signed)
Rounding note:  covid + 6 liters o2

## 2020-08-07 NOTE — Assessment & Plan Note (Signed)
Blood pressure is fairly well controlled, no worrisome highs or lows

## 2020-08-07 NOTE — Assessment & Plan Note (Signed)
BID Lantus with sliding scale, hyperglycemia due to Decadron, Lantus dose increased, improving to 160 range

## 2020-08-07 NOTE — Progress Notes (Signed)
Chart has been reviewed for care management needs: guardianship, substance use disorder, homelessness, and  readmission risk. PT to eval. The patient was also reviewed during multidisciplinary rounds for purposes of discharge planning. Pt being treated for COVID.CM to follow for discharge planning needs.

## 2020-08-07 NOTE — Progress Notes (Signed)
Problem: Falls - Risk of  Goal: *Absence of Falls  Description: Document Alejandro [name] Fall Risk and appropriate interventions in the flowsheet.  Outcome: Progressing Towards Goal  Note: Fall Risk Interventions:  Mobility Interventions: Bed/chair exit alarm, Patient to call before getting OOB, Strengthening exercises (ROM-active/passive), Utilize walker, cane, or other assistive device, Utilize gait belt for transfers/ambulation    Mentation Interventions: Bed/chair exit alarm, Adequate sleep, hydration, pain control, Increase mobility, More frequent rounding    Medication Interventions: Bed/chair exit alarm, Patient to call before getting OOB    Elimination Interventions: Call light in reach, Bed/chair exit alarm              Problem: Patient Education: Go to Patient Education Activity  Goal: Patient/Family Education  Outcome: Progressing Towards Goal

## 2020-08-07 NOTE — Assessment & Plan Note (Signed)
I spoke with pt's guardian Alejandro [name] on 08/09/20; we reviewed Alejandro [name] clinical course, fluctuating oxygen needs, criteria for home oxygen; she is aware of the treatment plan and approves

## 2020-08-07 NOTE — Assessment & Plan Note (Signed)
PT and OT consulted, advance activity as able

## 2020-08-08 LAB — GLUCOSE, POC
Glucose (POC): 253 mg/dL — ABNORMAL HIGH (ref 75–110)
Glucose (POC): 310 mg/dL — ABNORMAL HIGH (ref 75–110)
Glucose (POC): 340 mg/dL — ABNORMAL HIGH (ref 75–110)
Glucose (POC): 251 mg/dL — ABNORMAL HIGH (ref 75–110)

## 2020-08-08 LAB — METABOLIC PANEL, COMPREHENSIVE
A-G Ratio: 0.7
ALT (SGPT): 21 U/L (ref 3–35)
AST (SGOT): 28 U/L (ref 15–40)
Albumin: 2.7 g/dL — ABNORMAL LOW (ref 3.5–5.0)
Alk. phosphatase: 63 U/L (ref 35–100)
Anion gap: 19 mmol/L
BUN/Creatinine ratio: 36
BUN: 33 MG/DL — ABNORMAL HIGH (ref 7–20)
Bilirubin, total: 1.4 mg/dL — ABNORMAL HIGH (ref 0.10–1.20)
CO2: 20 mmol/L (ref 20–32)
Calcium: 8.6 MG/DL — ABNORMAL LOW (ref 8.8–10.5)
Chloride: 101 mmol/L (ref 100–110)
Creatinine: 0.91 MG/DL (ref 0.40–1.20)
GFR est AA: >60 mL/min/{1.73_m2} (ref >60)
GFR est non-AA: >60 mL/min/{1.73_m2} (ref >60)
Globulin: 3.9 g/dL
Glucose: 356 mg/dL — ABNORMAL HIGH (ref 75–110)
Potassium: 4.4 mmol/L (ref 3.5–5.0)
Protein, total: 6.6 g/dL (ref 6.2–8.0)
Sodium: 136 mmol/L (ref 135–145)

## 2020-08-08 LAB — LIPID PANEL W/ REFLX DIRECT LDL
Cholesterol, total: 107 MG/DL — ABNORMAL LOW (ref 115–200)
HDL Cholesterol: 32 MG/DL — ABNORMAL LOW (ref 40–60)
LDL, calculated: 64.4 MG/DL — ABNORMAL LOW (ref 65–130)
N-HDL-C: 75 mg/dL
Triglyceride: 53 MG/DL (ref 30–150)

## 2020-08-08 LAB — COMPREHENSIVE METABOLIC PANEL
ALT: 21 U/L (ref 3–35)
AST: 28 U/L (ref 15–40)
Albumin/Globulin Ratio: 0.7
Albumin: 2.7 g/dL — ABNORMAL LOW (ref 3.5–5.0)
Alkaline Phosphatase: 63 U/L (ref 35–100)
Anion Gap: 19 mmol/L
BUN: 33 MG/DL — ABNORMAL HIGH (ref 7–20)
Bun/Cre Ratio: 36 NA
CO2: 20 mmol/L (ref 20–32)
Calcium: 8.6 MG/DL — ABNORMAL LOW (ref 8.8–10.5)
Chloride: 101 mmol/L (ref 100–110)
Creatinine: 0.91 MG/DL (ref 0.40–1.20)
EGFR IF NonAfrican American: >60 mL/min/{1.73_m2} (ref >60)
GFR African American: >60 mL/min/{1.73_m2} (ref >60)
Globulin: 3.9 g/dL
Glucose: 356 mg/dL — ABNORMAL HIGH (ref 75–110)
Potassium: 4.4 mmol/L (ref 3.5–5.0)
Sodium: 136 mmol/L (ref 135–145)
Total Bilirubin: 1.4 mg/dL — ABNORMAL HIGH (ref 0.10–1.20)
Total Protein: 6.6 g/dL (ref 6.2–8.0)

## 2020-08-08 LAB — LIPID PANEL W/ REFLEX DIRECT LDL
Cholesterol, Total: 107 MG/DL — ABNORMAL LOW (ref 115–200)
HDL: 32 MG/DL — ABNORMAL LOW (ref 40–60)
LDL Calculated: 64.4 MG/DL — ABNORMAL LOW (ref 65–130)
Non HDL Chol. (LDL+VLDL): 75 mg/dL
Triglycerides: 53 MG/DL (ref 30–150)

## 2020-08-08 LAB — POCT GLUCOSE
POC Glucose: 253 mg/dL — ABNORMAL HIGH (ref 75–110)
POC Glucose: 310 mg/dL — ABNORMAL HIGH (ref 75–110)
POC Glucose: 340 mg/dL — ABNORMAL HIGH (ref 75–110)
POC Glucose: 251 mg/dL — ABNORMAL HIGH (ref 75–110)

## 2020-08-08 MED ORDER — INSULIN GLARGINE 100 UNIT/ML (3 ML) SUB-Q PEN
100 unit/mL (3 mL) | Freq: Every evening | SUBCUTANEOUS | Status: DC
Start: 2020-08-08 — End: 2020-08-14
  Administered 2020-08-09 – 2020-08-14 (×6): via SUBCUTANEOUS

## 2020-08-08 MED ORDER — INSULIN GLARGINE 100 UNIT/ML (3 ML) SUB-Q PEN
100 unit/mL (3 mL) | Freq: Every evening | SUBCUTANEOUS | Status: DC
Start: 2020-08-08 — End: 2020-08-08
  Administered 2020-08-08: 01:00:00 via SUBCUTANEOUS

## 2020-08-08 MED ORDER — INSULIN GLARGINE 100 UNIT/ML (3 ML) SUB-Q PEN
100 unit/mL (3 mL) | Freq: Every day | SUBCUTANEOUS | Status: DC
Start: 2020-08-08 — End: 2020-08-12
  Administered 2020-08-08 – 2020-08-12 (×4): via SUBCUTANEOUS

## 2020-08-08 MED FILL — VEKLURY 100 MG INTRAVENOUS POWDER FOR SOLUTION: 100 mg | INTRAVENOUS | Qty: 100

## 2020-08-08 MED FILL — TAMSULOSIN SR 0.4 MG 24 HR CAP: 0.4 mg | ORAL | Qty: 1

## 2020-08-08 MED FILL — LEVOTHYROXINE 50 MCG TAB: 50 mcg | ORAL | Qty: 1

## 2020-08-08 MED FILL — QUETIAPINE 25 MG TAB: 25 mg | ORAL | Qty: 2

## 2020-08-08 MED FILL — AMLODIPINE 5 MG TAB: 5 mg | ORAL | Qty: 2

## 2020-08-08 MED FILL — ELIQUIS 2.5 MG TABLET: 2.5 mg | ORAL | Qty: 1

## 2020-08-08 MED FILL — LISINOPRIL 10 MG TAB: 10 mg | ORAL | Qty: 1

## 2020-08-08 MED FILL — DEXAMETHASONE SODIUM PHOSPHATE (PF) 10 MG/ML INJECTION: 10 mg/mL | INTRAMUSCULAR | Qty: 1

## 2020-08-08 MED FILL — PANTOPRAZOLE 40 MG TAB, DELAYED RELEASE: 40 mg | ORAL | Qty: 1

## 2020-08-08 MED FILL — DONEPEZIL 5 MG TAB: 5 mg | ORAL | Qty: 1

## 2020-08-08 MED FILL — ROSUVASTATIN 10 MG TAB: 10 mg | ORAL | Qty: 1

## 2020-08-08 MED FILL — OLUMIANT 2 MG TABLET: 2 mg | ORAL | Qty: 2

## 2020-08-08 MED FILL — THERAPEUTIC MULTIVITAMIN TAB: ORAL | Qty: 1

## 2020-08-08 NOTE — Progress Notes (Signed)
Hospitalist Progress Note    Subjective:   Daily Progress Note: 08/08/2020 7:03 PM    Chief Complaint:     Am I getting better?    Current Facility-Administered Medications   Medication Dose Route Frequency   ??? insulin glargine (LANTUS,BASAGLAR) pen 12 Units  12 Units SubCUTAneous QHS   ??? insulin glargine (LANTUS,BASAGLAR) pen 40 Units  40 Units SubCUTAneous DAILY   ??? remdesivir 100 mg in 0.9% sodium chloride 250 mL IVPB  100 mg IntraVENous Q24H   ??? baricitinib (OLUMIANT) tablet 4 mg  4 mg Oral DAILY   ??? dexamethasone (PF) (DECADRON) 10 mg/mL injection 6 mg  6 mg IntraVENous DAILY   ??? amLODIPine (NORVASC) tablet 10 mg  10 mg Oral DAILY   ??? lisinopriL (PRINIVIL, ZESTRIL) tablet 10 mg  10 mg Oral DAILY   ??? bisacodyL (DULCOLAX) suppository 10 mg  10 mg Rectal DAILY PRN   ??? donepeziL (ARICEPT) tablet 5 mg  5 mg Oral QHS   ??? apixaban (ELIQUIS) tablet 2.5 mg  2.5 mg Oral BID   ??? glucose chewable tablet 16 g  4 Tablet Oral PRN   ??? dextrose 40% (GLUTOSE) oral gel 1 Tube  1 Tube Oral PRN   ??? glucagon (GLUCAGEN) injection 1 mg  1 mg IntraMUSCular PRN   ??? dextrose 10% infusion 250 mL  250 mL IntraVENous PRN   ??? insulin aspart U-100 (NOVOLOG) pen   SubCUTAneous AC&HS   ??? levothyroxine (SYNTHROID) tablet 50 mcg  50 mcg Oral ACB   ??? magnesium hydroxide (MILK OF MAGNESIA) 400 mg/5 mL oral suspension 30 mL  30 mL Oral QHS PRN   ??? therapeutic multivitamin (THERAGRAN) tablet 1 Tablet  1 Tablet Oral DAILY   ??? pantoprazole (PROTONIX) tablet 40 mg  40 mg Oral DAILY   ??? QUEtiapine (SEROquel) tablet 50 mg  50 mg Oral BID   ??? rosuvastatin (CRESTOR) tablet 10 mg  10 mg Oral QHS   ??? tamsulosin (FLOMAX) capsule 0.4 mg  0.4 mg Oral DAILY   ??? sodium chloride (NS) flush 3-10 mL  3-10 mL IntraVENous Q12H   ??? sodium chloride (NS) flush 3-10 mL  3-10 mL IntraVENous PRN   ??? acetaminophen (TYLENOL) tablet 650 mg  650 mg Oral Q4H PRN    Or   ??? acetaminophen (TYLENOL) solution 650 mg  650 mg Oral Q4H PRN    Or   ??? acetaminophen (TYLENOL) suppository  650 mg  650 mg Rectal Q4H PRN   ??? oxyCODONE IR (ROXICODONE) tablet 5 mg  5 mg Oral Q4H PRN   ??? naloxone (NARCAN) injection 0.4 mg  0.4 mg IntraVENous PRN   ??? dicyclomine (BENTYL) capsule 10 mg  10 mg Oral Q6H PRN   ??? diphenhydrAMINE (BENADRYL) injection 12.5 mg  12.5 mg IntraVENous Q4H PRN   ??? ondansetron (ZOFRAN) injection 4 mg  4 mg IntraVENous Q6H PRN    Or   ??? ondansetron (ZOFRAN ODT) tablet 4 mg  4 mg Oral Q6H PRN   ??? magnesium hydroxide (MILK OF MAGNESIA) 400 mg/5 mL oral suspension 30 mL  30 mL Oral DAILY PRN   ??? bisacodyL (DULCOLAX) tablet 10 mg  10 mg Oral DAILY PRN        Review of Systems  Review of Systems   Constitutional: Negative for fever.   HENT: Negative for hearing loss and sore throat.    Eyes: Negative for blurred vision and redness.   Respiratory: Positive for cough and shortness of  breath.    Cardiovascular: Negative for chest pain and PND.   Gastrointestinal: Negative for abdominal pain.   Genitourinary: Negative for dysuria.   Musculoskeletal: Negative for myalgias.   Skin: Negative for rash.   Neurological: Negative for dizziness, focal weakness and headaches.   Endo/Heme/Allergies: Does not bruise/bleed easily.   Psychiatric/Behavioral: Positive for memory loss.       Objective:     Visit Vitals  BP (!) 144/72 (BP 1 Location: Right upper arm, BP Patient Position: At rest) Comment: RN aware   Pulse (!) 59   Temp 97.7 ??F (36.5 ??C)   Resp 20   SpO2 99%    O2 Flow Rate (L/min): 4 l/min O2 Device: Hi flow nasal cannula    Temp (24hrs), Avg:97.9 ??F (36.6 ??C), Min:97.4 ??F (36.3 ??C), Max:98.9 ??F (37.2 ??C)      No intake/output data recorded.  09/30 0701 - 10/01 1900  In: 980 [P.O.:480; I.V.:500]  Out: 850 [Urine:850]    Physical Exam:  Constitutional: Alert and appropriate, not toxic appearing  HEENT: moist mucus membranes, no scleral icterus, EOMI, PERRL, no cervical lymphadenopathy, supple  Respiratory: respirations even with no distress, good air movement, no rales or ronchi  Cardiovascular:  RRR, nl S1S2, physiologically split S4, no murmurs, rubs or gallops, no jugular venous distention  Abdominal Exam: soft without guarding, bowel sounds present, organs difficult to discern, no masses  Extremities: no edema, no clubbing, no cyanosis  Neurologic: no focal changes, moves all extremities, gait is normal, at baseline  Psychiatric: no suicidal ideation, mood is normal  Musculoskeletal: no acute synovitis, appropriate range of motion (ROM)      Data Review     Recent Results (from the past 24 hour(s))   GLUCOSE, POC    Collection Time: 08/07/20  9:14 PM   Result Value Ref Range    Glucose (POC) 253 (H) 75 - 110 mg/dL    Performed by Cheri Guppy    GLUCOSE, POC    Collection Time: 08/08/20  7:27 AM   Result Value Ref Range    Glucose (POC) 310 (H) 75 - 110 mg/dL    Performed by Roma Kayser    LIPID PANEL W/ REFLX DIRECT LDL    Collection Time: 08/08/20 11:00 AM   Result Value Ref Range    Cholesterol, total 107 (L) 115 - 200 MG/DL    Triglyceride 53 30 - 150 MG/DL    HDL Cholesterol 32 (L) 40 - 60 MG/DL    LDL, calculated 64.4 (L) 65 - 130 MG/DL    N-HDL-C 75 mg/dL   METABOLIC PANEL, COMPREHENSIVE    Collection Time: 08/08/20 11:00 AM   Result Value Ref Range    Sodium 136 135 - 145 mmol/L    Potassium 4.4 3.5 - 5.0 mmol/L    Chloride 101 100 - 110 mmol/L    CO2 20 20 - 32 mmol/L    Anion gap 19 mmol/L    Glucose 356 (H) 75 - 110 mg/dL    BUN 33 (H) 7 - 20 MG/DL    Creatinine 0.91 0.40 - 1.20 MG/DL    BUN/Creatinine ratio 36     GFR est AA >60 >60 ml/min/1.47m    GFR est non-AA >60 >60 ml/min/1.762m   Calcium 8.6 (L) 8.8 - 10.5 MG/DL    Bilirubin, total 1.40 (H) 0.10 - 1.20 mg/dL    ALT (SGPT) 21 3 - 35 U/L    AST (SGOT) 28 15 - 40  U/L    Alk. phosphatase 63 35 - 100 U/L    Protein, total 6.6 6.2 - 8.0 g/dL    Albumin 2.7 (L) 3.5 - 5.0 g/dL    Globulin 3.9 g/dL    A-G Ratio 0.7     GLUCOSE, POC    Collection Time: 08/08/20 11:21 AM   Result Value Ref Range    Glucose (POC) 340 (H) 75 - 110 mg/dL     Performed by Roma Kayser    GLUCOSE, POC    Collection Time: 08/08/20  4:37 PM   Result Value Ref Range    Glucose (POC) 251 (H) 75 - 110 mg/dL    Performed by Rosie Fate        Reviewed notes from previous day   Reviewed new clinical lab tests from today    Reviewed imaging independently ??? CXR imaging     Assessment/Plan:     Acute hypoxemic respiratory failure due to COVID-19 [facility])  Oxygen needs are from 4-8 L/min, now on 4L/min  Continue current treatment plan for COVID  3/10, Decadron  3/14, Baricitinib  3/5, Remdesiver  Maintain oxygen saturation around 92%    Dementia ([place])  I spoke with pt's guardian Altha Harm on admission and on 08/07/20; she is aware of the treatment plan and approves    Type 1 diabetes mellitus with hyperglycemia, with long-term current use of insulin (HCC)  BID Lantus with sliding scale, hyperglycemia due to Decadron, Lantus dose increased    Generalized muscle weakness  PT and OT consulted    Hypo-osmolality and hyponatremia  No recurrence thus far    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      I spent 35 minutes of which greater than 50% was spent face-to-face counseling and/or coordinating care to discuss the diagnosis, treatment and management options for patient today.    Signed By: Yves Dill, MD     August 08, 2020 7:03 PM

## 2020-08-08 NOTE — Progress Notes (Signed)
GENERAL THERAPY EVALUATION  Background  Precautions at Admission    Home Situation  Home Environment: Assisted living  Care Facility Name: [facility] Residence: One story  Living Alone:  (Yes, in apartment but support from staff)  Support Systems: Assisted Living  Patient Expects to be Discharged to:: Assisted living  Current DME Used/Available at Home: None  Psychosocial                 Mental Status                    Vision                       Auditory     Balance  Balance  Sitting: Intact  Standing: Impaired  Standing - Static: Fair  Standing - Dynamic : Poor  Bed Mobility     Supine to Sit: Stand-by assistance  Sit to Supine: Stand-by assistance  Scooting: Moderate assistance;Assist x2 Functional Transfers  Sit to Stand: Minimum assistance  Stand to Sit: Contact guard assistance (cues needed for hand placement)  Bed to Chair: Minimum assistance                    Assistive Device: Aeronautical engineer of Support: Widened  Speed/Cadence: Slow;Shuffled  Step Length: Right shortened;Left shortened  Gait Abnormalities: Decreased step clearance  Ambulation - Level of Assistance: Contact guard assistance  Distance (ft): 2 Feet (ft)  Assistive Device: Financial planner Device: Walker  [employer] Bearing Status        Teaching laboratory technician of Support: Widened  Speed/Cadence: Slow;Shuffled  Step Length: Right shortened;Left shortened        Gait Abnormalities: Decreased step clearance  Ambulation - Level of Assistance: Contact guard assistance  Distance (ft): 2 Feet (ft)                       Wheelchair Mobility       Basic ADL                    IADLs              Upper extremity assessment:     Lower extremity assessment:      Therapy Evaluation  Subjective: Patient reports he "just isn't feeling well" he kept asking multiple questions throughout session about his status and when the doctor is going to come see him. Patient also adamant during session about seeing a priest.   Prior Level of Function:  Patient is a fair historian. Patient could not recall the name of the facility, however, PT/OT saw in chart he resides at [place]. He reports he was fairly functional and he did not use a device for mobility.   Diagnosis:  Active Problems:    Hypertension ()      Presence of cardiac pacemaker ()      Hypo-osmolality and hyponatremia ()      Generalized muscle weakness ()      Type 1 diabetes mellitus with hyperglycemia, with long-term current use of insulin (HCC) (06/10/2020)      Overview: Dx 1960      Dementia (HCC) (08/06/2020)      Acute hypoxemic respiratory failure due to COVID-19 [facility]) (08/06/2020)         Past medical history:  Past Medical History:   Diagnosis Date   .  Dementia (HCC)    . Diabetes (HCC)    . Grief     loss of wife 2014   . Hypertension    . Presence of cardiac pacemaker      Assessment: Based on the objective data in flowsheets, the patient presents with decreased endurance, activity tolerance, mobility and decreased strength. Patient demonstrates supine to sit transfer with SBA with additional time and HOB slightly raised. Sitting EOB patients SpO2 was 93% on 8L. Patient demonstrates sit to stand transfer to standard walker with min A with unsteadiness noted and cues provided for hand placement and sequencing. Patient then reports he can't stand any longer and sits back down without reaching back; education provided. Patient completes another sit to stand transfer with min A demonstrating unsteadiness initially but once balance regained patient only needed CGA to stand. Patient pivots to chair with CGA, again not reaching back with education provided. Sitting in chair SpO2 was 92%. Patient tolerates sitting in chair with good tolerance while completing hygiene activities. Patient's bed sheets were soaked with urine and these were changed. Patient completes sit to stand from chair with min A with cues for hand placement and sequencing. Patient transfers back to bed with CGA. Patient  demonstrates sit to supine transfer with SBA. Supine SpO2 was 91%. Patient demonstrated SOB at this time and reporting not feeling well. At this time both therapists attempted to leave the room then patient increasingly became concerned about his status, wanting a priest and wanting to talk to the doctor. OT tried to re-orient and calm patient and RN notified of patient's request. At this time due to medical status recommending SNF for further rehab, however, patient has potential for home health after further therapy sessions. Recommending use of 2WW for mobility and transferring to commode for toileting at this time due to unsteadiness.   Ampac: [employer] AM-PAC '6 Clicks'  Basic Mobility (V.2) Inpatient Short Form  Please check the box that reflects your best answer to each question. How much help from another person do you currently need.  (If the patient hasn't done an activity recently, how much help from another person do you think he/she would need if he/she tried?) Total A Lot A Little None   1. Turning from your back to your side while in a flat bed without using bedrails? 1 2 3 4    2. Moving from lying on your back to sitting on the side of a flat bed without using bedrails? 1 2 3 4    3. Moving to and from a bed to a chair (including a wheelchair)? 1 2 3 4    4. Standing up from a chair using your arms (e.g., wheelchair, or bedside chair)? 1 2 3 4    5. To walk in hospital room? 1 2 3 4    6. Climbing 3-5 steps with a railing?* 1 2 3 4    AM-PAC Short Form Manual (v. 3.0)  2016, Trustees of [address], under license to [place], [place]. All rights reserved.  *If stair climbing cannot be assessed, skip item #6. Summarize responses for items 1-5 and use the 5-item conversion table to obtain the Standardized (t-scale) score.    Raw Score: 15/20  Patient Name: Alejandro [name]  Evaluating Therapist: Tobey Grim, PT  Date: 08/08/2020      Bed alarm activated: YES  TABS unit applied: N/A  Call bed  within reach: YES  Urinal within reach or Purwick/Foley Catheter in place: YES  Transportation Plan: Wheelchair  Zenaida Niece  Communicated with: Susy Frizzle, RN; Denny Peon, OT  Handouts: NA      PT Therapy Comments: SNF (HH potential after few sessions); PTA - N; **May need 2WW  OT    PT Plan of Care: 7 times/week       Therapist: Tobey Grim, PT

## 2020-08-08 NOTE — Progress Notes (Signed)
 GENERAL THERAPY EVALUATION  Background  Precautions at Admission    Home Situation  Home Environment: Assisted living  Care Facility Name: [name]  # Steps to Enter: 0  One/Two Story Residence: One story  Support Systems: Assisted Living  Tub or Shower Type:  (he says he showers)  Psychosocial  Patient Behaviors: Cooperative;Confused              Mental Status  Neurologic State: Alert  Orientation Level: Oriented to person;Disoriented to situation;Disoriented to place  Cognition: Follows commands;Appropriate safety awareness;Appropriate decision making;Appropriate for age attention/concentration           Vision                       Auditory     Balance  Balance  Sitting: Intact  Standing: Impaired  Standing - Static: Fair  Standing - Dynamic : Poor  Bed Mobility     Supine to Sit: Minimum assistance  Sit to Supine: Stand-by assistance  Scooting: Moderate assistance;Assist x1 (for boosting up in bed) Functional Transfers  Sit to Stand: Minimum assistance;Assist x1  Stand to Sit: Contact guard assistance  Bed to Chair: Minimum assistance                          Gait           Weight Bearing Status        Gait                                               Wheelchair Mobility       Basic ADL  Feeding: Independent        Upper Body Dressing: Contact guard assistance     Toileting: Contact guard assistance (urinal use in bed)  IADLs              Upper extremity assessment:  Upper Extremity Assessment  UE Assessment (WDL): Within defined limits  Lower extremity assessment:      Therapy Evaluation  Subjective: Patient resting in bed, CNA had difficulties getting his telemetry changed, he didn't want it.   Prior Level of Function: Patient is only oriented to himself, didn't understand his situation and didn't know where he was. The chart was listing he is from [facility] assisted living and has history of dementia. He reports he doesn't use a walker and is independent. The evaluation is shows patient to be pretty  functional and independent at baseline.  Diagnosis:  Active Problems:    Hypertension ()      Presence of cardiac pacemaker ()      Hypo-osmolality and hyponatremia ()      Generalized muscle weakness ()      Type 1 diabetes mellitus with hyperglycemia, with long-term current use of insulin  (HCC) (06/10/2020)      Overview: Dx 1960      Dementia (HCC) (08/06/2020)      Acute hypoxemic respiratory failure due to COVID-19 [facility]) (08/06/2020)         Past medical history:  Past Medical History:   Diagnosis Date   . Dementia (HCC)    . Diabetes (HCC)    . Grief     loss of wife 2014   . Hypertension    . Presence of cardiac pacemaker  Assessment: Based on the objective data in flowsheets, the patient presents with decreased activity tolerance, decreased balance, decreased ADL independence and decreased functional mobility independence. He was agreeable to work with therapy and attempt out of bed activity. His first stance, he stood briefly and needed to sit down without advancing steps. A chair was brought over and he agreed to get to the chair while fresh linen was changed. He requested to brush his teeth and drink water during the eval. He was able to brush his teeth with setup and SBA as needed. He washed his face/mouth. He did cough some and spit up some phlegm. His balance in his transfer was fair to poor and he required min assist with use of walker. Possible upgrading of the walker in future sessions, communicate with PT. He demonstrates potential for [facility] if here for a few days and progresses well.  Ampac: [employer] AM-PACTM '6 Clicks'  Daily Activity Inpatient Short Form  Please check the box that reflects your best answer to each question. How much help from another person does the patient currently need.  (If the patient hasn't done an activity recently, how much help from another person do you think he/she would need if he/she tried?) Total A Lot A Little None   1. Putting on and taking off regular  lower body clothing? 1 2 3 4    2. Bathing (including washing, rinsing, drying)? 1 2 3 4    3. Toileting, which includes using toilet, bedpan or urinal? 1 2 3 4    4. Putting on and taking off regular upper body clothing? 1 2 3 4    5. Taking care of personal grooming such as brushing teeth? 1 2 3 4    6. Eating meals? 1 2 3 4    AM-PAC Short Form Manual (v. 3.0)  2016, Trustees of [address], under license to [employer], [place]. All rights reserved.  Interpretation of Tool: Represents clinically-significant functional categories (i.e.Activities of daily living).    Raw Score: 16/24  Patient Name: Alejandro [name]  Evaluating Therapist: Rocky CHRISTELLA Mall, OT  Date: 08/08/2020    Bed alarm activated: YES  TABS unit applied: N/A  Call bed within reach: YES  Urinal within reach or Purwick/Foley Catheter in place: YES  Transportation Plan: wheelchair fleeta  Communicated with: RN Adina, PT Haley  Handouts: NA      PT    OT Therapy Comments: SNFbut potential for [facility] with progress     OT Plan of Care: 6 times/week    Therapist: Rocky CHRISTELLA Mall, OT

## 2020-08-08 NOTE — Progress Notes (Signed)
I had a phone conversation with the pt. The call was initiated by the pt. Records show that the pt is non catholic. He was asking for [facility]. He confirmed that he is actually Catholic. Nurse says pt is confused. I am unable to visit Covid positive pts a tthis moment. I have promised to make a follow up with him tomorrow. I have also discussed this matter with pt's nurse.

## 2020-08-08 NOTE — Progress Notes (Signed)
Rounding note : covid+ dx sept 21. 6 liters o2 .Has dementia limiting his ability to remember .

## 2020-08-08 NOTE — Progress Notes (Signed)
 2100: Patient became agitated when staff was present in the room and refused to have assessment completed. Patient said leave me alone. Patient took medications orally with no issues swallowing.    2345: Staff assisted patient with using urinal. Patient refused vitals signs and said to just let me sleep. Patient oxygenation was increased to 5 liters nasal cannula.

## 2020-08-08 NOTE — Progress Notes (Signed)
Bedside and Verbal shift change report given to Kris, RN and Sasha, GN (oncoming nurse) by Matthew, RN (offgoing nurse). Report included the following information SBAR, Kardex, Intake/Output and Recent Results.

## 2020-08-09 LAB — METABOLIC PANEL, COMPREHENSIVE
A-G Ratio: 0.7
ALT (SGPT): 22 U/L (ref 3–35)
AST (SGOT): 31 U/L (ref 15–40)
Albumin: 2.8 g/dL — ABNORMAL LOW (ref 3.5–5.0)
Alk. phosphatase: 63 U/L (ref 35–100)
Anion gap: 17 mmol/L
BUN/Creatinine ratio: 31
BUN: 26 MG/DL — ABNORMAL HIGH (ref 7–20)
Bilirubin, total: 0.9 mg/dL (ref 0.10–1.20)
CO2: 22 mmol/L (ref 20–32)
Calcium: 9 MG/DL (ref 8.8–10.5)
Chloride: 103 mmol/L (ref 100–110)
Creatinine: 0.85 MG/DL (ref 0.40–1.20)
GFR est AA: >60 mL/min/{1.73_m2} (ref >60)
GFR est non-AA: >60 mL/min/{1.73_m2} (ref >60)
Globulin: 3.9 g/dL
Glucose: 182 mg/dL — ABNORMAL HIGH (ref 75–110)
Potassium: 4.2 mmol/L (ref 3.5–5.0)
Protein, total: 6.7 g/dL (ref 6.2–8.0)
Sodium: 138 mmol/L (ref 135–145)

## 2020-08-09 LAB — GLUCOSE, POC
Glucose (POC): 219 mg/dL — ABNORMAL HIGH (ref 75–110)
Glucose (POC): 189 mg/dL — ABNORMAL HIGH (ref 75–110)
Glucose (POC): 153 mg/dL — ABNORMAL HIGH (ref 75–110)
Glucose (POC): 168 mg/dL — ABNORMAL HIGH (ref 75–110)
Glucose (POC): 162 mg/dL — ABNORMAL HIGH (ref 75–110)

## 2020-08-09 LAB — POCT GLUCOSE
POC Glucose: 219 mg/dL — ABNORMAL HIGH (ref 75–110)
POC Glucose: 189 mg/dL — ABNORMAL HIGH (ref 75–110)
POC Glucose: 153 mg/dL — ABNORMAL HIGH (ref 75–110)
POC Glucose: 168 mg/dL — ABNORMAL HIGH (ref 75–110)
POC Glucose: 162 mg/dL — ABNORMAL HIGH (ref 75–110)

## 2020-08-09 LAB — COMPREHENSIVE METABOLIC PANEL
ALT: 22 U/L (ref 3–35)
AST: 31 U/L (ref 15–40)
Albumin/Globulin Ratio: 0.7
Albumin: 2.8 g/dL — ABNORMAL LOW (ref 3.5–5.0)
Alkaline Phosphatase: 63 U/L (ref 35–100)
Anion Gap: 17 mmol/L
BUN: 26 MG/DL — ABNORMAL HIGH (ref 7–20)
Bun/Cre Ratio: 31 NA
CO2: 22 mmol/L (ref 20–32)
Calcium: 9 MG/DL (ref 8.8–10.5)
Chloride: 103 mmol/L (ref 100–110)
Creatinine: 0.85 MG/DL (ref 0.40–1.20)
EGFR IF NonAfrican American: >60 mL/min/{1.73_m2} (ref >60)
GFR African American: >60 mL/min/{1.73_m2} (ref >60)
Globulin: 3.9 g/dL
Glucose: 182 mg/dL — ABNORMAL HIGH (ref 75–110)
Potassium: 4.2 mmol/L (ref 3.5–5.0)
Sodium: 138 mmol/L (ref 135–145)
Total Bilirubin: 0.9 mg/dL (ref 0.10–1.20)
Total Protein: 6.7 g/dL (ref 6.2–8.0)

## 2020-08-09 MED FILL — ROSUVASTATIN 10 MG TAB: 10 mg | ORAL | Qty: 1

## 2020-08-09 MED FILL — VEKLURY 100 MG INTRAVENOUS POWDER FOR SOLUTION: 100 mg | INTRAVENOUS | Qty: 100

## 2020-08-09 MED FILL — TAMSULOSIN SR 0.4 MG 24 HR CAP: 0.4 mg | ORAL | Qty: 1

## 2020-08-09 MED FILL — ELIQUIS 2.5 MG TABLET: 2.5 mg | ORAL | Qty: 1

## 2020-08-09 MED FILL — LEVOTHYROXINE 50 MCG TAB: 50 mcg | ORAL | Qty: 1

## 2020-08-09 MED FILL — QUETIAPINE 25 MG TAB: 25 mg | ORAL | Qty: 2

## 2020-08-09 MED FILL — AMLODIPINE 5 MG TAB: 5 mg | ORAL | Qty: 2

## 2020-08-09 MED FILL — DEXAMETHASONE SODIUM PHOSPHATE (PF) 10 MG/ML INJECTION: 10 mg/mL | INTRAMUSCULAR | Qty: 1

## 2020-08-09 MED FILL — PANTOPRAZOLE 40 MG TAB, DELAYED RELEASE: 40 mg | ORAL | Qty: 1

## 2020-08-09 MED FILL — THERAPEUTIC MULTIVITAMIN TAB: ORAL | Qty: 1

## 2020-08-09 MED FILL — OLUMIANT 2 MG TABLET: 2 mg | ORAL | Qty: 2

## 2020-08-09 MED FILL — DONEPEZIL 5 MG TAB: 5 mg | ORAL | Qty: 1

## 2020-08-09 MED FILL — LISINOPRIL 10 MG TAB: 10 mg | ORAL | Qty: 1

## 2020-08-09 NOTE — Progress Notes (Signed)
Problem: Falls - Risk of  Goal: *Absence of Falls  Description: Document Schmid Fall Risk and appropriate interventions in the flowsheet.  Outcome: Progressing Towards Goal  Note: Fall Risk Interventions:  Mobility Interventions: Bed/chair exit alarm, Patient to call before getting OOB    Mentation Interventions: Adequate sleep, hydration, pain control, Bed/chair exit alarm, Door open when patient unattended    Medication Interventions: Bed/chair exit alarm, Patient to call before getting OOB, Teach patient to arise slowly    Elimination Interventions: Bed/chair exit alarm, Call light in reach, Patient to call for help with toileting needs

## 2020-08-09 NOTE — Progress Notes (Signed)
Patient alert and oriented to person only; respirations even and unlabored, dyspnea with exertion; denies pain. No other needs or complaints voiced at this time,

## 2020-08-09 NOTE — Progress Notes (Signed)
THERAPY TREATMENT NOTE  Mental Status                    Pain:                       Bed/Mat Mobility     Supine to Sit: Stand-by assistance;Bed Modified;Additional time  Sit to Supine: Stand-by assistance  Sit to Stand: Minimum assistance  Scooting: Contact guard assistance (to EOB)  Balance  Balance  Sitting: Impaired;Without support  Sitting - Static: Good (unsupported)  Sitting - Dynamic: Fair (occasional)  Standing: Impaired;With support  Standing - Static: Fair  Standing - Dynamic : Fair  Teacher, early years/pre  Sit to Stand: Minimum assistance  Stand to Sit: Contact guard assistance     Bed to Chair: Minimum assistance                               Wheelchair Management                        Gait Training     Assistive Device: Walker  [employer] Bearing Status        Gait  Base of Support: Widened  Speed/Cadence: Slow;Shuffled  Step Length: Right shortened;Left shortened        Gait Abnormalities: Decreased step clearance  Ambulation - Level of Assistance: Contact guard assistance  Distance (ft): 8 Feet (ft)                      Cognitive Retraining     Feeding     Toileting     Grooming     Upper Body Bathing     Lower Body Bathing     Upper Body Dressing Assistance     Lower Body Dressing Assistance     Upper extremity assessment:     Lower extremity assessment:              Subjective: Patient initially declines out of bed but with encouragement agreeable to participate.   Change in Medical Condition: Patient on 3L of O2 at rest but during session required increase to 15L with activity.   Assessment: Patient will continue to benefit from skilled therapy to address remaining functional deficits: decreased strength, endurance, and activity tolerance. Patient completes supine to sit transfer with SBA with HOB elevated and and additional time to complete. Sitting EOB patient had to use support from bed to maintain upright position. SpO2 was 93%-94%. Patient completed sit to stand with min A and ambulated from bed to  sink with standard walker, CGA. Once to sink patient reported needing a rest break and he was 92% still on 3L. Patient agreed to try to stand and brush his teeth and use the urinal requiring min A-CGA to maintain upright posture; patient stood for a total of 3 mins. SOB noted and patient requested to sit; SpOe was 79%; increase SpO2 to 6L and patient was cued for pursed lip breathing several times, however, patient continued to mouth breath. Patient only increased to 84%-86%; patient increased to 10L with same cues for pursed lip breathing with patient still mouth breathing. Eventually patient increased to 15L and patient finally increased to 90%. PT/OT slid chair back to bed and patient completed stand pivot transfer with standard walker with min A. Patient completed sit to supine transfer with SBA. Once in bed continued cued provided  for pursed lip breathing and patient increased to 90%-94%; eventually patient titrated back down to 3L. At end of session patient had several questions about his POC and what is being done to help him. PT/OT educated patient and provided encouragement. Continuing to recommend SNF at this time with the potential for [facility] after a few therapy sessions. Continuing to recommend use of standard walker for mobility 1 assist for transfers to commode.   Plan: Continue plan of care  Therapeutic Exercises: no  Bed alarm activated: YES  TABS unit applied: N/A  Call bell within reach: YES  Urinal within reach or Purwick/Foley Catheter in place: YES  Transportation Plan: Wheelchair Sales executive with: Beth, RN; Addison, OT  Handouts: NA    PT Therapy Comments: SNF (HH potential after few sessions); PTA - N; **May need 2WW  OT Therapy Comments: SNFbut potential for [facility] with progress  PT Plan of Care: 7 times/week       Therapist: Tobey Grim, PT

## 2020-08-09 NOTE — Progress Notes (Signed)
THERAPY TREATMENT NOTE  Mental Status  Neurologic State: Alert  Orientation Level: Oriented to person;Disoriented to place;Disoriented to time;Disoriented to situation  Cognition: Follows commands        Safety/Judgement: Decreased awareness of environment;Decreased awareness of need for assistance;Decreased awareness of need for safety;Decreased insight into deficits  Pain:                    Time Calculation: 46 mins  Bed/Mat Mobility     Supine to Sit: Stand-by assistance;Bed Modified;Additional time     Sit to Stand: Minimum assistance  Scooting: Contact guard assistance (scooting EOB)  Balance  Balance  Sitting: Impaired;Without support  Sitting - Static: Good (unsupported)  Sitting - Dynamic: Fair (occasional)  Standing: Impaired;With support  Standing - Static: Fair  Standing - Dynamic : Fair  Teacher, early years/pre  Sit to Stand: Minimum assistance  Stand to Sit: Minimum assistance     Bed to Chair: Contact guard assistance (to chair in front of sink)                               Wheelchair Management                        Gait Training        Weight Bearing Status        Gait                                              Cognitive Retraining  Cognitive Retraining  Safety/Judgement: Decreased awareness of environment;Decreased awareness of need for assistance;Decreased awareness of need for safety;Decreased insight into deficits  Feeding     Toileting     Grooming  Grooming  Grooming Assistance: Set-up;Contact guard assistance;Minimum assistance  Position Performed: Standing  Washing Face: Set-up (lying in bed)  Brushing Teeth: Set-up;Contact guard assistance;Minimum assistance (CGA and min A for stnading balance)  Upper Body Bathing     Lower Body Bathing     Upper Body Dressing Assistance     Lower Body Dressing Assistance     Upper extremity assessment:     Lower extremity assessment:              Subjective: Patient presents supine in bed, agreeable to participate in therapy once he was educated on the  purpose of OT/PT.   Patient presents on 3L hi flow NC supine in bed at 94%  Change in Medical Condition: n/a  Assessment: Patient will continue to benefit from skilled therapy to address remaining functional deficits: Decreased activity tolerance, decreased standing balance/tolerance, decreased ADL independence and decreased functional mobility/transfers, increase O2 needs with activity. Patient is pleasantly confused, alert and oriented to self only. He completes supine to sit with HOB raised, additional time and SBA. Patient scoots towards EOB with CGA. Presents with fair sitting balance, requires occasional support for dynamic sitting balance while scooting towards EOB. He requires min A, bed raised and VCs for safe hand placement with sit to stand. He ambulates to the  sink using 2WW and close CGA. Patient requires a seated rest break once he gets ot the sink (approxtialyet 5'). Patient's O2 at 92% hi flow NC seated in chair. Patient stands at sink to brush his teeth and has a quick urge to urinate,  urinal provided and he stands holding urinal in place with min A. He presents with fair standing balance, requiring occasional min A for standing balance, swaying and leaning forwards to spit into sink. Patient stood completing oral hygiene and urinates for approximately 3-4 minutes then sits, O2 at 79%, requires increase O2 to 6L to attempt to recover, patient does not recover after a minute, requires this therapist to bump O2 to 10L. Patient spo2 @ 84-86% seated in chair at 10L hi flow NC. O2 was then bumped up to the highest it could go, 15L and spo2 then increase to 91%.  PT/OT slid chair back to bed and patient completed stand pivot transfer with standard walker with min A. Patient completed sit to supine transfer with SBA. Once in bed continued cued provided for pursed lip breathing and patient increased to 90%-94%;  Patient requires constant verbal and visual education on PLB as he appears to breath in through  his mouth only. He returns to the bed to rest and therapy titrate him back down to 3L  With time given and patient left on 3L at 91-94%. Patient provided with a warm wash cloth for his face and all needs in place. RN updated on O2 needs with activity. Recommend one assist, gait belt and 2WW to bedside commode. Recommend monitor O2 closely, requiring 8-10L O2 with activity. Recommend D/C to SNF upon discharge with potential to progress to back to [place] with [facility] services.     Plan: Update activities as tolerated and Continue plan of care  Therapeutic Exercises: no  Bed alarm activated: YES  TABS unit applied: N/A  Call bell within reach: YES  Urinal within reach or Purwick/Foley Catheter in place: YES  Transportation Plan: WC Zenaida Niece  Communicated with: Beth, RN  Handouts: none     PT Therapy Comments: SNF (HH potential after few sessions); PTA - N; **May need 2WW  OT Therapy Comments: SNFbut potential for [facility] with progress     OT Plan of Care: 6 times/week    Therapist: Aquilla Solian, OT

## 2020-08-09 NOTE — Progress Notes (Signed)
Hospitalist Progress Note    Subjective:   Daily Progress Note: 08/09/2020 5:32 PM    Chief Complaint:     No complaints today. He says that he is feeling better    Current Facility-Administered Medications   Medication Dose Route Frequency   ??? insulin glargine (LANTUS,BASAGLAR) pen 20 Units  20 Units SubCUTAneous QHS   ??? insulin glargine (LANTUS,BASAGLAR) pen 40 Units  40 Units SubCUTAneous DAILY   ??? remdesivir 100 mg in 0.9% sodium chloride 250 mL IVPB  100 mg IntraVENous Q24H   ??? baricitinib (OLUMIANT) tablet 4 mg  4 mg Oral DAILY   ??? dexamethasone (PF) (DECADRON) 10 mg/mL injection 6 mg  6 mg IntraVENous DAILY   ??? amLODIPine (NORVASC) tablet 10 mg  10 mg Oral DAILY   ??? lisinopriL (PRINIVIL, ZESTRIL) tablet 10 mg  10 mg Oral DAILY   ??? bisacodyL (DULCOLAX) suppository 10 mg  10 mg Rectal DAILY PRN   ??? donepeziL (ARICEPT) tablet 5 mg  5 mg Oral QHS   ??? apixaban (ELIQUIS) tablet 2.5 mg  2.5 mg Oral BID   ??? glucose chewable tablet 16 g  4 Tablet Oral PRN   ??? dextrose 40% (GLUTOSE) oral gel 1 Tube  1 Tube Oral PRN   ??? glucagon (GLUCAGEN) injection 1 mg  1 mg IntraMUSCular PRN   ??? dextrose 10% infusion 250 mL  250 mL IntraVENous PRN   ??? insulin aspart U-100 (NOVOLOG) pen   SubCUTAneous AC&HS   ??? levothyroxine (SYNTHROID) tablet 50 mcg  50 mcg Oral ACB   ??? magnesium hydroxide (MILK OF MAGNESIA) 400 mg/5 mL oral suspension 30 mL  30 mL Oral QHS PRN   ??? therapeutic multivitamin (THERAGRAN) tablet 1 Tablet  1 Tablet Oral DAILY   ??? pantoprazole (PROTONIX) tablet 40 mg  40 mg Oral DAILY   ??? QUEtiapine (SEROquel) tablet 50 mg  50 mg Oral BID   ??? rosuvastatin (CRESTOR) tablet 10 mg  10 mg Oral QHS   ??? tamsulosin (FLOMAX) capsule 0.4 mg  0.4 mg Oral DAILY   ??? sodium chloride (NS) flush 3-10 mL  3-10 mL IntraVENous Q12H   ??? sodium chloride (NS) flush 3-10 mL  3-10 mL IntraVENous PRN   ??? acetaminophen (TYLENOL) tablet 650 mg  650 mg Oral Q4H PRN    Or   ??? acetaminophen (TYLENOL) solution 650 mg  650 mg Oral Q4H PRN    Or   ???  acetaminophen (TYLENOL) suppository 650 mg  650 mg Rectal Q4H PRN   ??? oxyCODONE IR (ROXICODONE) tablet 5 mg  5 mg Oral Q4H PRN   ??? naloxone (NARCAN) injection 0.4 mg  0.4 mg IntraVENous PRN   ??? dicyclomine (BENTYL) capsule 10 mg  10 mg Oral Q6H PRN   ??? diphenhydrAMINE (BENADRYL) injection 12.5 mg  12.5 mg IntraVENous Q4H PRN   ??? ondansetron (ZOFRAN) injection 4 mg  4 mg IntraVENous Q6H PRN    Or   ??? ondansetron (ZOFRAN ODT) tablet 4 mg  4 mg Oral Q6H PRN   ??? magnesium hydroxide (MILK OF MAGNESIA) 400 mg/5 mL oral suspension 30 mL  30 mL Oral DAILY PRN   ??? bisacodyL (DULCOLAX) tablet 10 mg  10 mg Oral DAILY PRN        Review of Systems  Review of Systems   Constitutional: Negative for chills and fever.   HENT: Negative for hearing loss and sore throat.    Eyes: Negative for blurred vision and redness.  Respiratory: Positive for cough. Negative for shortness of breath and wheezing.    Cardiovascular: Negative for chest pain and PND.   Gastrointestinal: Negative for abdominal pain.   Genitourinary: Negative for dysuria.   Musculoskeletal: Negative for myalgias.   Skin: Negative for rash.   Neurological: Negative for dizziness, focal weakness and headaches.   Endo/Heme/Allergies: Does not bruise/bleed easily.   Psychiatric/Behavioral: Positive for memory loss.       Objective:     Visit Vitals  BP 122/64 (BP 1 Location: Right upper arm, BP Patient Position: At rest;Lying)   Pulse 60   Temp 96.8 ??F (36 ??C)   Resp 20   Wt 67.9 kg (149 lb 11.1 oz)   SpO2 91%   BMI 20.88 kg/m??    O2 Flow Rate (L/min): 15 l/min (had to increase with activity; back to 3L at rest) O2 Device: Hi flow nasal cannula    Temp (24hrs), Avg:97.4 ??F (36.3 ??C), Min:96.8 ??F (36 ??C), Max:97.7 ??F (36.5 ??C)      10/02 0701 - 10/02 1900  In: 250 [I.V.:250]  Out: 325 [Urine:325]  09/30 1901 - 10/02 0700  In: 730 [P.O.:480; I.V.:250]  Out: 550 [Urine:550]    Physical Exam:  Constitutional: Alert and appropriate, not toxic appearing, speaking in full  sentences  HEENT: moist mucus membranes, no scleral icterus, EOMI, PERRL, no cervical lymphadenopathy, supple  Respiratory: respirations even with no distress, reduced air movement  Cardiovascular: RRR, nl S1S2, physiologically split S4, no murmurs, rubs or gallops, no jugular venous distention  Abdominal Exam: soft without guarding, bowel sounds present, organs difficult to discern, no masses  Extremities: no edema, no clubbing, no cyanosis  Neurologic: no focal changes, moves all extremities  Psychiatric: mood is normal, we discussed faith  Musculoskeletal: no acute synovitis, appropriate range of motion (ROM)      Data Review     Recent Results (from the past 24 hour(s))   GLUCOSE, POC    Collection Time: 08/08/20  9:33 PM   Result Value Ref Range    Glucose (POC) 219 (H) 75 - 110 mg/dL    Performed by [place], POC    Collection Time: 08/09/20  6:40 AM   Result Value Ref Range    Glucose (POC) 189 (H) 75 - 110 mg/dL    Performed by Bobbe Medico    METABOLIC PANEL, COMPREHENSIVE    Collection Time: 08/09/20  8:50 AM   Result Value Ref Range    Sodium 138 135 - 145 mmol/L    Potassium 4.2 3.5 - 5.0 mmol/L    Chloride 103 100 - 110 mmol/L    CO2 22 20 - 32 mmol/L    Anion gap 17 mmol/L    Glucose 182 (H) 75 - 110 mg/dL    BUN 26 (H) 7 - 20 MG/DL    Creatinine 0.85 0.40 - 1.20 MG/DL    BUN/Creatinine ratio 31     GFR est AA >60 >60 ml/min/1.32m    GFR est non-AA >60 >60 ml/min/1.753m   Calcium 9.0 8.8 - 10.5 MG/DL    Bilirubin, total 0.90 0.10 - 1.20 mg/dL    ALT (SGPT) 22 3 - 35 U/L    AST (SGOT) 31 15 - 40 U/L    Alk. phosphatase 63 35 - 100 U/L    Protein, total 6.7 6.2 - 8.0 g/dL    Albumin 2.8 (L) 3.5 - 5.0 g/dL    Globulin 3.9 g/dL  A-G Ratio 0.7     GLUCOSE, POC    Collection Time: 08/09/20 10:06 AM   Result Value Ref Range    Glucose (POC) 153 (H) 75 - 110 mg/dL    Performed by Harolyn Rutherford    GLUCOSE, POC    Collection Time: 08/09/20 11:09 AM   Result Value Ref Range    Glucose (POC)  168 (H) 75 - 110 mg/dL    Performed by Bobbe Medico    GLUCOSE, POC    Collection Time: 08/09/20  4:07 PM   Result Value Ref Range    Glucose (POC) 162 (H) 75 - 110 mg/dL    Performed by Bobbe Medico        Reviewed notes from previous day   Reviewed new clinical lab tests from today    Reviewed imaging independently ??? CXR imaging     Assessment/Plan:     Acute hypoxemic respiratory failure due to COVID-19 [facility])  Oxygen needs have not escalated, initially on 4L, now on 3L/min  Continue current treatment plan for COVID  4/10, Decadron  4/14, Baricitinib  4/5, Remdesiver  Maintain oxygen saturation around 92%    Dementia ([place])  I spoke with pt's guardian Altha Harm; we reviewed Mr. Mahler clinical course, fluctuating oxygen needs, criteria for home oxygen; she is aware of his treatment plan and approves    Type 1 diabetes mellitus with hyperglycemia, with long-term current use of insulin (HCC)  BID Lantus with sliding scale, hyperglycemia due to Decadron, Lantus dose increased, improving to 160 range    Generalized muscle weakness  PT and OT consulted, advance activity as able    Presence of cardiac pacemaker  Stable with pacing, continue outpatient battery checks and interrogations    Hypertension  Blood pressure is fairly well controlled, no worrisome highs or lows    Hypo-osmolality and hyponatremia???resolved as of 08/09/2020  No recurrence thus far            Care Plan discussed with: Patient andwith care team including nursing, and respiratory therapy.    I spent 35 minutes of which greater than 50% was spent face-to-face counseling and/or coordinating care to discuss the diagnosis, treatment and management options for patient today.    Signed By: Yves Dill, MD     August 09, 2020 5:32 PM

## 2020-08-10 LAB — GLUCOSE, POC
Glucose (POC): 191 mg/dL — ABNORMAL HIGH (ref 75–110)
Glucose (POC): 122 mg/dL — ABNORMAL HIGH (ref 75–110)
Glucose (POC): 116 mg/dL — ABNORMAL HIGH (ref 75–110)
Glucose (POC): 165 mg/dL — ABNORMAL HIGH (ref 75–110)

## 2020-08-10 LAB — POCT GLUCOSE
POC Glucose: 191 mg/dL — ABNORMAL HIGH (ref 75–110)
POC Glucose: 122 mg/dL — ABNORMAL HIGH (ref 75–110)
POC Glucose: 116 mg/dL — ABNORMAL HIGH (ref 75–110)
POC Glucose: 165 mg/dL — ABNORMAL HIGH (ref 75–110)

## 2020-08-10 MED FILL — LEVOTHYROXINE 50 MCG TAB: 50 mcg | ORAL | Qty: 1

## 2020-08-10 MED FILL — AMLODIPINE 5 MG TAB: 5 mg | ORAL | Qty: 2

## 2020-08-10 MED FILL — ELIQUIS 2.5 MG TABLET: 2.5 mg | ORAL | Qty: 1

## 2020-08-10 MED FILL — THERAPEUTIC MULTIVITAMIN TAB: ORAL | Qty: 1

## 2020-08-10 MED FILL — QUETIAPINE 25 MG TAB: 25 mg | ORAL | Qty: 2

## 2020-08-10 MED FILL — TAMSULOSIN SR 0.4 MG 24 HR CAP: 0.4 mg | ORAL | Qty: 1

## 2020-08-10 MED FILL — DONEPEZIL 5 MG TAB: 5 mg | ORAL | Qty: 1

## 2020-08-10 MED FILL — PANTOPRAZOLE 40 MG TAB, DELAYED RELEASE: 40 mg | ORAL | Qty: 1

## 2020-08-10 MED FILL — DEXAMETHASONE SODIUM PHOSPHATE (PF) 10 MG/ML INJECTION: 10 mg/mL | INTRAMUSCULAR | Qty: 1

## 2020-08-10 MED FILL — LISINOPRIL 10 MG TAB: 10 mg | ORAL | Qty: 1

## 2020-08-10 MED FILL — OLUMIANT 2 MG TABLET: 2 mg | ORAL | Qty: 2

## 2020-08-10 MED FILL — ROSUVASTATIN 10 MG TAB: 10 mg | ORAL | Qty: 1

## 2020-08-10 NOTE — Progress Notes (Signed)
THERAPY TREATMENT NOTE  Mental Status                    Pain:                       Bed/Mat Mobility     Supine to Sit: Stand-by assistance;Assist x1;Bed Modified;Additional time  Sit to Supine: Stand-by assistance  Sit to Stand: Minimum assistance;Additional time;Assist x1  Scooting: Contact guard assistance (to EOB)  Balance  Balance  Sitting: Impaired;Without support  Sitting - Static: Fair (occasional)  Sitting - Dynamic: Fair (occasional)  Standing: Impaired;With support  Standing - Static: Fair  Standing - Dynamic : Fair  Training and development officer  Sit to Stand: Minimum assistance;Additional time;Assist x1  Stand to Sit: Contact guard assistance (VC for hand placement)                                     Wheelchair Management                        Gait Training     Assistive Device: Walker  Weight Bearing Status        Gait  Base of Support: Widened  Speed/Cadence: Slow;Shuffled  Step Length: Left shortened;Right shortened        Gait Abnormalities: Decreased step clearance  Ambulation - Level of Assistance: Contact guard assistance  Distance (ft): 8 Feet (ft)                      Cognitive Retraining     Feeding     Toileting     Grooming     Upper Body Bathing     Lower Body Bathing     Upper Body Dressing Assistance     Lower Body Dressing Assistance     Upper extremity assessment:     Lower extremity assessment:              Subjective: Pt reports he is agreeable to working with PT at this time.   Change in Medical Condition: RN states OK to see, put O2 up to 10L for activity and monitor O2 sat.   Assessment: Patient will continue to benefit from skilled therapy to address remaining functional deficits: impaired strength, endurance and activity tolerance. At rest in supine pt on 2L O2 with O2 sat 98%. Pt completes supine to sit transfer with SBA and bed modified, requires CGA for trunk stability when scooting EOB. Once in sitting pt O2 at 92%. Increased flow to 10 L prior to pt first sit to stand, once standing  O2 at 92% on 10L. Pt reports he is tired and quickly sits without using hands to control descent, resulting in a plop to the bed and min A from therapist to prevent posterior trunk lean into supine.  Pt able to recover sitting balance to using his own UE to support. Sit to stand a second time required min A x1 with bed slightly elevated and VC for hand placement. Pt demo ability to ambulate 8 feet with increased trunk sway during turning, once back at bed he demonstrates improved compliance with hand placement to control descent. Pt O2 sat at this time 83%, bumped O2 up to 15L, pt requires nearly 5 minutes sitting EOB to recover O2 with persistent cuing for PLB, pt completes correctly about 25% of cues.  Once Pt O2 at 92%, he demo CGA for transfer back to supine and requires min A to boost up in bed. Pt left in bed with needs met, he is asking about lunch, which I told him would be along shortly and he is asking about a priest, I have informed his RN. PT continuing to recommend SNF at this time but he may demo improved potential for Home with [facility] in 2-3 more days. Cont using walker and 1 A for transfers to commode.   Plan: Continue plan of care  Therapeutic Exercises: no  Bed alarm activated: YES  TABS unit applied: N/A  Call bell within reach: YES  Urinal within reach or Purwick/Foley Catheter in place: YES  Transportation Plan: Wheelchair [employer] with: RN, Beth  Handouts: NA    PT Therapy Comments: SNF ([place] potential after a few sessions); PTA- N; ** May need 2WW  OT    PT Plan of Care: 7 times/week       Therapist: Cornell Barman, PT        t

## 2020-08-10 NOTE — Progress Notes (Signed)
Problem: Loneliness or Risk for Loneliness  Goal: Demonstrate positive use of time alone when socialization is not possible  Outcome: Progressing Towards Goal     Problem: Falls - Risk of  Goal: *Absence of Falls  Description: Document Schmid Fall Risk and appropriate interventions in the flowsheet.  Outcome: Progressing Towards Goal  Note: Fall Risk Interventions:  Mobility Interventions: Bed/chair exit alarm, Patient to call before getting OOB, PT Consult for mobility concerns    Mentation Interventions: Bed/chair exit alarm, Evaluate medications/consider consulting pharmacy    Medication Interventions: Bed/chair exit alarm, Patient to call before getting OOB, Teach patient to arise slowly    Elimination Interventions: Bed/chair exit alarm, Call light in reach, Stay With Me (per policy), Urinal in reach

## 2020-08-10 NOTE — Progress Notes (Signed)
Problem: Falls - Risk of  Goal: *Absence of Falls  Description: Document Schmid Fall Risk and appropriate interventions in the flowsheet.  Outcome: Progressing Towards Goal  Note: Fall Risk Interventions:  Mobility Interventions: Bed/chair exit alarm, Patient to call before getting OOB    Mentation Interventions: Adequate sleep, hydration, pain control, Bed/chair exit alarm    Medication Interventions: Bed/chair exit alarm, Patient to call before getting OOB, Teach patient to arise slowly    Elimination Interventions: Bed/chair exit alarm, Call light in reach, Patient to call for help with toileting needs

## 2020-08-10 NOTE — Progress Notes (Signed)
Hospitalist Progress Note    Subjective:   Daily Progress Note: 08/10/2020 2:56 PM    Chief Complaint:     How am I doing? I want to see the priest Hospital doctor RN to help arrange a phone call for 10/4)    Current Facility-Administered Medications   Medication Dose Route Frequency   ??? insulin glargine (LANTUS,BASAGLAR) pen 20 Units  20 Units SubCUTAneous QHS   ??? insulin glargine (LANTUS,BASAGLAR) pen 40 Units  40 Units SubCUTAneous DAILY   ??? baricitinib (OLUMIANT) tablet 4 mg  4 mg Oral DAILY   ??? dexamethasone (PF) (DECADRON) 10 mg/mL injection 6 mg  6 mg IntraVENous DAILY   ??? amLODIPine (NORVASC) tablet 10 mg  10 mg Oral DAILY   ??? lisinopriL (PRINIVIL, ZESTRIL) tablet 10 mg  10 mg Oral DAILY   ??? bisacodyL (DULCOLAX) suppository 10 mg  10 mg Rectal DAILY PRN   ??? donepeziL (ARICEPT) tablet 5 mg  5 mg Oral QHS   ??? apixaban (ELIQUIS) tablet 2.5 mg  2.5 mg Oral BID   ??? glucose chewable tablet 16 g  4 Tablet Oral PRN   ??? dextrose 40% (GLUTOSE) oral gel 1 Tube  1 Tube Oral PRN   ??? glucagon (GLUCAGEN) injection 1 mg  1 mg IntraMUSCular PRN   ??? dextrose 10% infusion 250 mL  250 mL IntraVENous PRN   ??? insulin aspart U-100 (NOVOLOG) pen   SubCUTAneous AC&HS   ??? levothyroxine (SYNTHROID) tablet 50 mcg  50 mcg Oral ACB   ??? magnesium hydroxide (MILK OF MAGNESIA) 400 mg/5 mL oral suspension 30 mL  30 mL Oral QHS PRN   ??? therapeutic multivitamin (THERAGRAN) tablet 1 Tablet  1 Tablet Oral DAILY   ??? pantoprazole (PROTONIX) tablet 40 mg  40 mg Oral DAILY   ??? QUEtiapine (SEROquel) tablet 50 mg  50 mg Oral BID   ??? rosuvastatin (CRESTOR) tablet 10 mg  10 mg Oral QHS   ??? tamsulosin (FLOMAX) capsule 0.4 mg  0.4 mg Oral DAILY   ??? sodium chloride (NS) flush 3-10 mL  3-10 mL IntraVENous Q12H   ??? sodium chloride (NS) flush 3-10 mL  3-10 mL IntraVENous PRN   ??? acetaminophen (TYLENOL) tablet 650 mg  650 mg Oral Q4H PRN    Or   ??? acetaminophen (TYLENOL) solution 650 mg  650 mg Oral Q4H PRN    Or   ??? acetaminophen (TYLENOL) suppository 650 mg  650 mg  Rectal Q4H PRN   ??? oxyCODONE IR (ROXICODONE) tablet 5 mg  5 mg Oral Q4H PRN   ??? naloxone (NARCAN) injection 0.4 mg  0.4 mg IntraVENous PRN   ??? dicyclomine (BENTYL) capsule 10 mg  10 mg Oral Q6H PRN   ??? diphenhydrAMINE (BENADRYL) injection 12.5 mg  12.5 mg IntraVENous Q4H PRN   ??? ondansetron (ZOFRAN) injection 4 mg  4 mg IntraVENous Q6H PRN    Or   ??? ondansetron (ZOFRAN ODT) tablet 4 mg  4 mg Oral Q6H PRN   ??? magnesium hydroxide (MILK OF MAGNESIA) 400 mg/5 mL oral suspension 30 mL  30 mL Oral DAILY PRN   ??? bisacodyL (DULCOLAX) tablet 10 mg  10 mg Oral DAILY PRN        Review of Systems  Review of Systems   Constitutional: Negative for chills and fever.   HENT: Negative for sore throat.    Eyes: Negative for blurred vision and redness.   Respiratory: Positive for cough. Negative for hemoptysis, sputum production and  shortness of breath.    Cardiovascular: Negative for chest pain.   Gastrointestinal: Negative for abdominal pain.   Genitourinary: Negative for dysuria.   Musculoskeletal: Negative for myalgias.   Skin: Negative for rash.   Neurological: Negative for dizziness, focal weakness and headaches.   Endo/Heme/Allergies: Does not bruise/bleed easily.   Psychiatric/Behavioral: Positive for memory loss.       Objective:     Visit Vitals  BP 125/65 (BP 1 Location: Right upper arm, BP Patient Position: At rest)   Pulse 63   Temp 98.1 ??F (36.7 ??C)   Resp 18   Wt 67.9 kg (149 lb 11.1 oz)   SpO2 (!) 83%   BMI 20.88 kg/m??    O2 Flow Rate (L/min): 15 l/min O2 Device: Hi flow nasal cannula    Temp (24hrs), Avg:97.7 ??F (36.5 ??C), Min:96.8 ??F (36 ??C), Max:98.2 ??F (36.8 ??C)      10/03 0701 - 10/03 1900  In: -   Out: 161 [WRUEA:540]  10/01 1901 - 10/03 0700  In: 450 [P.O.:200; I.V.:250]  Out: 950 [Urine:950]    Physical Exam:  Constitutional: Alert, repeats questions, not toxic appearing  HEENT: moist mucus membranes, no scleral icterus, no cervical lymphadenopathy, supple  Respiratory: respirations even with no distress,  reduced air movement, bilateral crackles with deep inspiration  Cardiovascular: RRR, nl S1S2, physiologically split S4, no murmurs, rubs or gallops, no jugular venous distention  Abdominal Exam: soft without guarding, bowel sounds present, organs difficult to discern, no masses  Extremities: no edema, no clubbing, no cyanosis  Neurologic: no focal changes, moves all extremities, gait is normal, at baseline  Psychiatric: no suicidal ideation, mood is normal  Musculoskeletal: no acute synovitis, appropriate range of motion (ROM)      Data Review     Recent Results (from the past 24 hour(s))   GLUCOSE, POC    Collection Time: 08/09/20  4:07 PM   Result Value Ref Range    Glucose (POC) 162 (H) 75 - 110 mg/dL    Performed by Bobbe Medico    GLUCOSE, POC    Collection Time: 08/09/20  9:34 PM   Result Value Ref Range    Glucose (POC) 191 (H) 75 - 110 mg/dL    Performed by Whipple Levada Dy    GLUCOSE, POC    Collection Time: 08/10/20  6:56 AM   Result Value Ref Range    Glucose (POC) 122 (H) 75 - 110 mg/dL    Performed by [place], POC    Collection Time: 08/10/20 10:54 AM   Result Value Ref Range    Glucose (POC) 116 (H) 75 - 110 mg/dL    Performed by [place], COMPREHENSIVE    Collection Time: 08/10/20 11:33 AM   Result Value Ref Range    Sodium 137 135 - 145 mmol/L    Potassium 4.1 3.5 - 5.0 mmol/L    Chloride 106 100 - 110 mmol/L    CO2 PENDING mmol/L    Anion gap PENDING mmol/L    Glucose 124 (H) 75 - 110 mg/dL    BUN 20 7 - 20 MG/DL    Creatinine 0.79 0.40 - 1.20 MG/DL    BUN/Creatinine ratio 25     GFR est AA >60 >60 ml/min/1.31m    GFR est non-AA >60 >60 ml/min/1.760m   Calcium 8.2 (L) 8.8 - 10.5 MG/DL    Bilirubin, total 0.70 0.10 - 1.20 mg/dL  ALT (SGPT) 24 3 - 35 U/L    AST (SGOT) 40 15 - 40 U/L    Alk. phosphatase 56 35 - 100 U/L    Protein, total 6.2 6.2 - 8.0 g/dL    Albumin 2.6 (L) 3.5 - 5.0 g/dL    Globulin 3.6 g/dL    A-G Ratio 0.7         Reviewed notes  from previous day   Reviewed new clinical lab tests from today    Reviewed imaging independently ??? CXR imaging     Assessment/Plan:     Acute hypoxemic respiratory failure due to COVID-19 [facility])  Oxygen needs have not escalated, 2-4 L/min  Continue current treatment plan for COVID  5/10, Decadron  5/14, Baricitinib  Remdesivir completed on 10/3  Maintain oxygen saturation around 92%  Pt required 15L/min when active with PT    Dementia ([place])  I spoke with pt's guardian Altha Harm on 08/09/20; we reviewed Mr. Wieman's clinical course, fluctuating oxygen needs, criteria for home oxygen; she is aware of the treatment plan and approves    Type 1 diabetes mellitus with hyperglycemia, with long-term current use of insulin (HCC)  BID Lantus with sliding scale, hyperglycemia due to Decadron, Lantus dose increased, improving to 160 range    Generalized muscle weakness  PT and OT consulted, advance activity as able    Presence of cardiac pacemaker  Stable with pacing, continue outpatient battery checks and interrogations    Hypertension  Blood pressure is fairly well controlled, no worrisome highs or lows    Hypo-osmolality and hyponatremia???resolved as of 08/09/2020  No recurrence thus far    Nursing to reach out for a telephone visit with our priest, and a face to face once off precautions        Care Plan discussed with: Patient and Nursing.        I spent 35 minutes of which greater than 50% was spent face-to-face counseling and/or coordinating care to discuss the diagnosis, treatment and management options for patient today.    Signed By: Yves Dill, MD     August 10, 2020 2:56 PM

## 2020-08-10 NOTE — Progress Notes (Signed)
Patient alert and oriented to person, respirations even and unlabored at rest; dyspnea with exertion; maintaining SpO2 90s with 2LPM at rest; required 15LPM with activity per PT.

## 2020-08-11 LAB — GLUCOSE, POC
Glucose (POC): 168 mg/dL — ABNORMAL HIGH (ref 75–110)
Glucose (POC): 68 mg/dL — ABNORMAL LOW (ref 75–110)
Glucose (POC): 124 mg/dL — ABNORMAL HIGH (ref 75–110)
Glucose (POC): 184 mg/dL — ABNORMAL HIGH (ref 75–110)
Glucose (POC): 315 mg/dL — ABNORMAL HIGH (ref 75–110)

## 2020-08-11 LAB — METABOLIC PANEL, COMPREHENSIVE
A-G Ratio: 0.7
ALT (SGPT): 24 U/L (ref 3–35)
AST (SGOT): 40 U/L (ref 15–40)
Albumin: 2.6 g/dL — ABNORMAL LOW (ref 3.5–5.0)
Alk. phosphatase: 56 U/L (ref 35–100)
Anion gap: 13 mmol/L
BUN/Creatinine ratio: 25
BUN: 20 MG/DL (ref 7–20)
Bilirubin, total: 0.7 mg/dL (ref 0.10–1.20)
CO2: 22 mmol/L (ref 20–32)
Calcium: 8.2 MG/DL — ABNORMAL LOW (ref 8.8–10.5)
Chloride: 106 mmol/L (ref 100–110)
Creatinine: 0.79 MG/DL (ref 0.40–1.20)
GFR est AA: >60 mL/min/{1.73_m2} (ref >60)
GFR est non-AA: >60 mL/min/{1.73_m2} (ref >60)
Globulin: 3.6 g/dL
Glucose: 124 mg/dL — ABNORMAL HIGH (ref 75–110)
Potassium: 4.1 mmol/L (ref 3.5–5.0)
Protein, total: 6.2 g/dL (ref 6.2–8.0)
Sodium: 137 mmol/L (ref 135–145)

## 2020-08-11 LAB — CULTURE, BLOOD: Culture result:: NO GROWTH

## 2020-08-11 LAB — CULTURE, BLOOD,  2ND DRAW
Culture result:: NO GROWTH
Culture: NO GROWTH

## 2020-08-11 LAB — POCT GLUCOSE
POC Glucose: 168 mg/dL — ABNORMAL HIGH (ref 75–110)
POC Glucose: 68 mg/dL — ABNORMAL LOW (ref 75–110)
POC Glucose: 124 mg/dL — ABNORMAL HIGH (ref 75–110)
POC Glucose: 184 mg/dL — ABNORMAL HIGH (ref 75–110)
POC Glucose: 315 mg/dL — ABNORMAL HIGH (ref 75–110)

## 2020-08-11 LAB — CULTURE, BLOOD 1: Culture: NO GROWTH

## 2020-08-11 LAB — COMPREHENSIVE METABOLIC PANEL
ALT: 24 U/L (ref 3–35)
AST: 40 U/L (ref 15–40)
Albumin/Globulin Ratio: 0.7
Albumin: 2.6 g/dL — ABNORMAL LOW (ref 3.5–5.0)
Alkaline Phosphatase: 56 U/L (ref 35–100)
Anion Gap: 13 mmol/L
BUN: 20 MG/DL (ref 7–20)
Bun/Cre Ratio: 25 NA
CO2: 22 mmol/L (ref 20–32)
Calcium: 8.2 MG/DL — ABNORMAL LOW (ref 8.8–10.5)
Chloride: 106 mmol/L (ref 100–110)
Creatinine: 0.79 MG/DL (ref 0.40–1.20)
EGFR IF NonAfrican American: >60 mL/min/{1.73_m2} (ref >60)
GFR African American: >60 mL/min/{1.73_m2} (ref >60)
Globulin: 3.6 g/dL
Glucose: 124 mg/dL — ABNORMAL HIGH (ref 75–110)
Potassium: 4.1 mmol/L (ref 3.5–5.0)
Sodium: 137 mmol/L (ref 135–145)
Total Bilirubin: 0.7 mg/dL (ref 0.10–1.20)
Total Protein: 6.2 g/dL (ref 6.2–8.0)

## 2020-08-11 MED FILL — QUETIAPINE 25 MG TAB: 25 mg | ORAL | Qty: 2

## 2020-08-11 MED FILL — DONEPEZIL 5 MG TAB: 5 mg | ORAL | Qty: 1

## 2020-08-11 MED FILL — TAMSULOSIN SR 0.4 MG 24 HR CAP: 0.4 mg | ORAL | Qty: 1

## 2020-08-11 MED FILL — THERAPEUTIC MULTIVITAMIN TAB: ORAL | Qty: 1

## 2020-08-11 MED FILL — ELIQUIS 2.5 MG TABLET: 2.5 mg | ORAL | Qty: 1

## 2020-08-11 MED FILL — ACETAMINOPHEN 325 MG TABLET: 325 mg | ORAL | Qty: 2

## 2020-08-11 MED FILL — AMLODIPINE 5 MG TAB: 5 mg | ORAL | Qty: 2

## 2020-08-11 MED FILL — PANTOPRAZOLE 40 MG TAB, DELAYED RELEASE: 40 mg | ORAL | Qty: 1

## 2020-08-11 MED FILL — LISINOPRIL 10 MG TAB: 10 mg | ORAL | Qty: 1

## 2020-08-11 MED FILL — DEXAMETHASONE SODIUM PHOSPHATE (PF) 10 MG/ML INJECTION: 10 mg/mL | INTRAMUSCULAR | Qty: 1

## 2020-08-11 MED FILL — LEVOTHYROXINE 50 MCG TAB: 50 mcg | ORAL | Qty: 1

## 2020-08-11 MED FILL — ROSUVASTATIN 10 MG TAB: 10 mg | ORAL | Qty: 1

## 2020-08-11 MED FILL — OLUMIANT 2 MG TABLET: 2 mg | ORAL | Qty: 2

## 2020-08-11 NOTE — Progress Notes (Signed)
Rounding note; pt/ot = snf . Awaiting guardianship verification .

## 2020-08-11 NOTE — Progress Notes (Signed)
Met with patient:  no       Caregiver support person aware of discharge planning:  yes       Will patient require specialized medical equipment or home modification: no       Does patient have insurance: yes       Is patient currently receiving support with substance use disorder? no       Does patient have community case worker or agency involved: no       Did the patient share any financial concerns? no       POA/Guardian/Advanced Directives if yes, do we have a copy? no    Attempting to obtain copy of guardianship papers .     The Plan for Transition of Care is Related to the Following Treatment Goals: TBD     The Patient and/or Patient Representative was Provided with a Choice of Provider and Agrees      with the Discharge Plan? n/a       If they do not agree what are concerns:      Was patient informed that the hospital benefits financially when services are provided by: yes    Care Management Interventions  Physical Therapy Consult: Yes  Occupational Therapy Consult: Yes  Support Systems: Assisted Living  The Plan for Transition of Care is Related to the Following Treatment Goals : snf vs hhs at Assisted living  The Patient and/or Patient Representative was Provided with a Choice of Provider and Agrees with the Discharge Plan?: Yes  Name of the Patient Representative Who was Provided with a Choice of Provider and Agrees with the Discharge Plan: Alejandro [name], guardian  Freedom of Choice List was Provided with Basic Dialogue that Supports the Patient's Individualized Plan of Care/Goals, Treatment Preferences and Shares the Quality Data Associated with the Providers?: Yes  Discharge Location  Discharge Placement: Unable to determine at this time     Cm spoke with Alejandro [name], guardian. I don't see guardianship paperwork in the chart so CM asked if she can bring it in. She said pt has a [facility] PCP and they should have a copy. CM assistant called the PCP office and they will call back about whether they have it or not.    Alejandro [name] said pt lives at [facility]. She agrees to snf if needed. She understands that it may need to be out of area. She said they would want [facility] if pt progressed to hhs. Cm let her know that therapy is recommending snf right now but are saying there is potential for hhs.CM will follow and will keep [name] updated.   She said her brother, Alejandro [name], is also guardian so if I can't reach her, I can call Alejandro [name].

## 2020-08-11 NOTE — Progress Notes (Signed)
08/11/20 1554   Type and Reason for Visit   Type and Reason for Visit Initial  (LOS)   Food/Nutrition-Related History/Allergies   Nutrition History and Allergies Shellfish   Nutrition Assessment   Nutrition Assessment Pt admitted for COVID infections. Attempted to call pt multiple times. Pt has been stable in the last severals days in term of O2 requirement. In take has been fair, around 26-50%. Will make his snacks Hi protein to aid in oral intake.   Anthropometric Measures   Height 5\' 11"  (1.803 m)   Current Body Weight 69.5 kg (153 lb 3.5 oz)   Weight Source   (chart)   Ideal Body Weight (lbs) (Calculated) 172 lbs   Ideal Body Weight (Kg) (Calculated) 78 kg   % IBW (Calculated) 89.1 %   BMI (calculated) 21.4   BMI Category Underweight (BMI less than 22) age over 35   Nutrient-Energy (Caloric) Requirements   Mifflin-St. Jeor (Overwt/Obese Pts) (kcals/day) 1442.13   Weight Used for Energy Requirements Current   Energy Requirements Based On Kcal/kg  (25-30kcal)   Total Energy Requirements (kcals/day) 1750-2100kcal   Nutrient-Protein Requirements   Weight Used for Protein Requirements Current  (1.2-1.5g/kg)   Total Protein Requirements (g/day) 84-105g/kg   Fluid Requirements   Total Fluid Requirements (ml/day) 1750-2156mL or per MD   Current Oral Intake   Average Meal Intake 26-50%  (over 6 meals)   Nutrition Diagnosis    Problem #1 Inadequate oral intake   Etiology #1 other (specify)  (COVID 19)   Signs/Symptoms #1 intake 26-50%   Nutrition Intervention   Food and/or Nutrient Delivery Continue current diet  (Encourage oral intake as tolerated within the diet order, high protein DM snacks)   Goals   Goals For pt to consume at least 50% of meals   Monitoring and Evaluation   Food/Nutrient Intake Outcomes Food and nutrient intake  (For pt to consume at least 50% of meals)   Physical Signs/Symptoms Outcomes Biochemical data;GI status;Weight   Nutrition Follow-Up Date   Nutrition Follow-up Date   (RA 10/7)

## 2020-08-11 NOTE — Telephone Encounter (Signed)
Alejandro [name] called from [facility] and states that pt is there right now with guardian who states that we have pts POA paperwork on file. Alejandro [name] can't see this in chart and wants to know if we have a copy of this. Please advise.    [phone]

## 2020-08-11 NOTE — Progress Notes (Signed)
THERAPY TREATMENT NOTE  Mental Status  Neurologic State: Alert     Cognition: Follows commands;Appropriate for age attention/concentration        Safety/Judgement: Decreased awareness of environment;Insight into deficits  Pain:                    Time Calculation: 45 mins  Bed/Mat Mobility     Supine to Sit: Contact guard assistance;Bed Modified     Sit to Stand: Contact guard assistance (off bed, min assist off chair with no rails)  Scooting: Stand-by assistance (to edge of bed)  Balance  Balance  Sitting: Impaired;With support  Sitting - Static: Fair (occasional)  Sitting - Dynamic: Fair (occasional)  Standing: Impaired;With support  Standing - Static: Fair  Standing - Dynamic : Fair  Teacher, early years/pre  Sit to Stand: Contact guard assistance (off bed, min assist off chair with no rails)  Stand to Sit: Minimum assistance     Bed to Chair: Contact guard assistance                               Wheelchair Management                        Gait Training        Weight Bearing Status        Gait                                              Cognitive Retraining  Cognitive Retraining  Safety/Judgement: Decreased awareness of environment;Insight into deficits  Feeding  Feeding  Food to Mouth: Independent  Drink to Mouth: Independent  Toileting     Grooming  Grooming  Grooming Assistance: Set-up;Stand-by assistance  Position Performed: Seated in chair (up to sink, sitting)  Brushing Teeth: Stand-by assistance  Cues: Verbal cues provided  Upper Body Bathing     Lower Body Bathing     Upper Body Dressing Assistance     Lower Body Dressing Assistance     Upper extremity assessment:     Lower extremity assessment:              Subjective: Patient in the bed, agreeable to work with therapy with encouragement.  Change in Medical Condition: RN reports okay to see.Decreased activity tolerance,   Assessment: Patient will continue to benefit from skilled therapy to address remaining functional deficits: Decreased activity tolerance,  decreased standing balance/tolerance, decreased ADL independence and decreased functional mobility/transfers, increase O2 needs with activity. Patient is pleasantly confused, alert and oriented to self only. Patient attempting to complete brushing his teeth up to the sink, however, he could not tolerate standing and needed to sit down. He was lethargic and demonstrating gross weakness. He was able to do the ADL task seated with setup and supervision. Cues for pursed lip breathing as he was desatting on 2 liters with activity, increased to 8L of oxygen to complete seated ADL. He was able to ambulate a few feet to sink with CGA and Walker( small shuffling steps) and then after seated ADL, ambulated to the recliner with a 2WW and CGA. He was left on 8L to recover from activity as he was 88-90% consistently. He was educated to press call bell when he wants to get back to bed. Continue  to progress activity as tolerated.  Plan: Continue plan of care  Therapeutic Exercises: no  Bed alarm activated: NO  TABS unit applied: YES  Call bell within reach: YES  Urinal within reach or Purwick/Foley Catheter in place: YES  Transportation Plan: wheelchair [employer] vs private vehicle  Communicated with: RN Molli Hazard, PT Marchelle Folks  Handouts: NA    PT Therapy Comments: SNF - [facility]; PTA - yes; **no DME needs if goes to SNF  OT Therapy Comments: SNF-[facility]     OT Plan of Care: 6 times/week    Therapist: Armanda Magic, OT

## 2020-08-11 NOTE — Progress Notes (Signed)
Bedside and Verbal shift change report given to R. Mott, Charity fundraiser (Cabin crew) by Molli Hazard, RN Physiological scientist). Report included the following information SBAR, Kardex and Recent Results.

## 2020-08-11 NOTE — Progress Notes (Signed)
THERAPY TREATMENT NOTE  Mental Status                    Pain:                       Bed/Mat Mobility     Supine to Sit: Contact guard assistance;Bed Modified (slight raised HOB and rail)     Sit to Stand: Contact guard assistance (from the bed; min A x2 to stand from chair)  Scooting: Contact guard assistance (to EOB in sitting)  Balance  Balance  Sitting: Impaired;Without support  Sitting - Static: Fair (occasional)  Sitting - Dynamic: Fair (occasional)  Standing: Impaired;With support  Standing - Static: Fair  Standing - Dynamic : Fair  Teacher, early years/pre  Sit to Stand: Contact guard assistance (from the bed; min A x2 to stand from chair)  Stand to Sit: Minimum assistance (for controlled descent)                                     Higher education careers adviser Training: Yes  Assistive Device: Environmental consultant, rolling  Weight Bearing Status        Gait  Base of Support: Widened  Speed/Cadence: Slow;Shuffled  Step Length: Right shortened;Left shortened        Gait Abnormalities: Decreased step clearance;Shuffling gait  Ambulation - Level of Assistance: Contact guard assistance  Distance (ft): 4 Feet (ft) (+4)                      Cognitive Retraining     Feeding     Toileting     Grooming     Upper Body Bathing     Lower Body Bathing     Upper Body Dressing Assistance     Lower Body Dressing Assistance     Upper extremity assessment:     Lower extremity assessment:              Subjective: RN reports okay to see and patient is agreeable to working with therapy.   Change in Medical Condition: none  Assessment: Patient will continue to benefit from skilled therapy to address remaining functional deficits: decreased strength, balance, transfers, gait and activity tolerance.  Patient SpO2 91% on 2L O2 at rest, sitting EOB.  Patient sat EOB for several minutes SPO2 91% on 2L O2.  Patient able to stand from the bed with CGA, using raised rail for support.  Patient initially utilized a standard  walker and was able to ambulate to the sink to brush his teeth with CGA.  Patient appeared to be attempting to push the walker, rather than pick it up. He demonstrates slow shuffling steps. Patient stood statically at the sink for 1-2 minutes, but was unable to remain standing to brush his teeth.  Patient provided with a chair and was able to complete brushing his teeth in sitting.  Patient found to have desat to 78% on 2L O2 and increased to 6L, only able to improve to 86%. Increased to 8L and improved to 88-90% after at least 5 minutes seated rest. Patient did require min A x2 to stand from low height chair without handles.  Patient progressed to 2WW and was able to ambulate to  the recliner chair and was agreeable to sit up for a while.  Patient left on 8L O2 at end of session to recover at he was 91%. PT recommending SNF at this time.     Plan: Continue plan of care  Therapeutic Exercises: no  Bed alarm activated: N/A  TABS unit applied: YES  Call bell within reach: YES  Urinal within reach or Purwick/Foley Catheter in place: YES  Transportation Plan: wheelchair [employer] with: RN Matt;CNA Felicia  Handouts: NA    PT Therapy Comments: SNF - [facility]; PTA - yes; **no DME needs if goes to SNF  OT Therapy Comments: SNF-[facility]  PT Plan of Care: 7 times/week       Therapist: Aris Lot, PT

## 2020-08-11 NOTE — Progress Notes (Signed)
Hospitalist Progress Note    Subjective:   CC: "Why am I still sick?"    S: Pt denies CP, SOB.  Feels myalgias and "just not well."      O:  Physical Exam  Vitals: Reviewed  General:  No acute distress.  Nontoxic.  Alert.    Eyes:  No scleral icterus.  No conjunctival injection.  Tracking with eyes.  Pupils equally round  HEENT:  Normocephalic. Atraumatic. No angioedema.  Voice is not muffled.  Neck:   No JVD.  No stridor  Cardiovascular:  Regular rate and rhythm.  No anasarca.  No lower extremity edema  Respiratory:  Speaks in full sentences.  No accessory muscle usage noted.  Work of breathing appears unlabored.  Acyanotic.  Lungs clear to auscultation bilaterally.  Abdomen/GI:  Soft, non-tender.  No rebound or guarding, bowel sounds present.  MSK:  All limbs are noted to be moving spontaneously  Integumentary:  Does not appear diaphoretic.  No pallor    A/P    Acute hypoxemic respiratory failure due to COVID-19 [facility])  Oxygen needs increasing, to 8 L/min  6/10, Decadron - change to oral  6/14, Baricitinib  Remdesivir completed   Maintain oxygen saturation around 92%    Hypertension well controlled    Generalized muscle weakness  PT and OT consulted, advance activity as able    Type 1 diabetes mellitus with hyperglycemia, with long-term current use of insulin (HCC)  BID Lantus with sliding scale, hyperglycemia due to Decadron, Lantus dose increased, improving to 160 range     DNR/DNI  DIET - Cardiac  DVT -  Eliquis        Visit Vitals  BP 125/72 (BP 1 Location: Left lower arm, BP Patient Position: At rest)   Pulse 60   Temp 97.8 ??F (36.6 ??C)   Resp 19   Ht 5\' 11"  (1.803 m)   Wt 69.5 kg (153 lb 3.5 oz)   SpO2 99%   BMI 21.37 kg/m??    O2 Flow Rate (L/min): 8 l/min O2 Device: Hi flow nasal cannula    Temp (24hrs), Avg:97.8 ??F (36.6 ??C), Min:97.6 ??F (36.4 ??C), Max:98 ??F (36.7 ??C)      No intake/output data recorded.  10/03 0701 - 10/04 1900  In: 1080 [P.O.:830; I.V.:250]  Out: 1590 [Urine:1590]       I reviewed labs  below:   Labs: Results:       Chemistry Recent Labs     08/10/20  1133 08/09/20  0850   GLU 124* 182*   NA 137 138   K 4.1 4.2   CL 106 103   CO2 22 22   BUN 20 26*   CREA 0.79 0.85   CA 8.2* 9.0   AGAP 13 17   BUCR 25 31   AP 56 63   TP 6.2 6.7   ALB 2.6* 2.8*   GLOB 3.6 3.9   AGRAT 0.7 0.7      CBC w/Diff No results for input(s): WBC, RBC, HGB, HCT, PLT, GRANS, LYMPH, EOS, RETIC, HGBEXT, HCTEXT, PLTEXT in the last 72 hours.   Coagulation No results for input(s): PTP, INR, APTT, INREXT in the last 72 hours.    Liver Enzymes Recent Labs     08/10/20  1133   TP 6.2   ALB 2.6*   AP 56   resultrcnt{24h@       ~~~~~~~~~        Data Review    Recent  Results (from the past 12 hour(s))   GLUCOSE, POC    Collection Time: 08/11/20  8:53 AM   Result Value Ref Range    Glucose (POC) 124 (H) 75 - 110 mg/dL    Performed by Carlyle Lipa    GLUCOSE, POC    Collection Time: 08/11/20 11:10 AM   Result Value Ref Range    Glucose (POC) 184 (H) 75 - 110 mg/dL    Performed by Lemar Lofty    GLUCOSE, POC    Collection Time: 08/11/20  4:12 PM   Result Value Ref Range    Glucose (POC) 315 (H) 75 - 110 mg/dL    Performed by Lemar Lofty    GLUCOSE, POC    Collection Time: 08/11/20  8:32 PM   Result Value Ref Range    Glucose (POC) 252 (H) 75 - 110 mg/dL    Performed by Douglass Rivers          Signed By: Antony Madura, DO     August 11, 2020       I apologize for any spelling or grammatical errors I may have overlooked while proof-reading.  If there are any questions or confusion please contact the hospitalist office at [phone]

## 2020-08-11 NOTE — Telephone Encounter (Signed)
Talked to Alvino Chapel, told her we do not have paperwork.    She is going to try [name] next to obtain a copy.    Signed By: Candi Leash     August 11, 2020

## 2020-08-12 ENCOUNTER — Ambulatory Visit: Primary: Registered Nurse

## 2020-08-12 LAB — GLUCOSE, POC
Glucose (POC): 252 mg/dL — ABNORMAL HIGH (ref 75–110)
Glucose (POC): 139 mg/dL — ABNORMAL HIGH (ref 75–110)
Glucose (POC): 256 mg/dL — ABNORMAL HIGH (ref 75–110)
Glucose (POC): 226 mg/dL — ABNORMAL HIGH (ref 75–110)

## 2020-08-12 LAB — POCT GLUCOSE
POC Glucose: 252 mg/dL — ABNORMAL HIGH (ref 75–110)
POC Glucose: 139 mg/dL — ABNORMAL HIGH (ref 75–110)
POC Glucose: 256 mg/dL — ABNORMAL HIGH (ref 75–110)
POC Glucose: 226 mg/dL — ABNORMAL HIGH (ref 75–110)

## 2020-08-12 MED ORDER — DEXAMETHASONE 1 MG TAB
1 mg | Freq: Every day | ORAL | Status: DC
Start: 2020-08-12 — End: 2020-08-14
  Administered 2020-08-12 – 2020-08-14 (×3): via ORAL

## 2020-08-12 MED ORDER — INSULIN GLARGINE 100 UNIT/ML (3 ML) SUB-Q PEN
100 unit/mL (3 mL) | Freq: Every day | SUBCUTANEOUS | Status: DC
Start: 2020-08-12 — End: 2020-08-14
  Administered 2020-08-13 – 2020-08-14 (×2): via SUBCUTANEOUS

## 2020-08-12 MED FILL — DONEPEZIL 5 MG TAB: 5 mg | ORAL | Qty: 1

## 2020-08-12 MED FILL — ELIQUIS 2.5 MG TABLET: 2.5 mg | ORAL | Qty: 1

## 2020-08-12 MED FILL — TAMSULOSIN SR 0.4 MG 24 HR CAP: 0.4 mg | ORAL | Qty: 1

## 2020-08-12 MED FILL — AMLODIPINE 5 MG TAB: 5 mg | ORAL | Qty: 2

## 2020-08-12 MED FILL — QUETIAPINE 25 MG TAB: 25 mg | ORAL | Qty: 2

## 2020-08-12 MED FILL — PANTOPRAZOLE 40 MG TAB, DELAYED RELEASE: 40 mg | ORAL | Qty: 1

## 2020-08-12 MED FILL — SEMGLEE PEN U-100 INSULIN 100 UNIT/ML (3 ML) SUBCUTANEOUS: 100 unit/mL (3 mL) | SUBCUTANEOUS | Qty: 3

## 2020-08-12 MED FILL — LEVOTHYROXINE 50 MCG TAB: 50 mcg | ORAL | Qty: 1

## 2020-08-12 MED FILL — OLUMIANT 2 MG TABLET: 2 mg | ORAL | Qty: 2

## 2020-08-12 MED FILL — LISINOPRIL 10 MG TAB: 10 mg | ORAL | Qty: 1

## 2020-08-12 MED FILL — THERAPEUTIC MULTIVITAMIN TAB: ORAL | Qty: 1

## 2020-08-12 MED FILL — DEXAMETHASONE 1 MG TAB: 1 mg | ORAL | Qty: 2

## 2020-08-12 MED FILL — ROSUVASTATIN 10 MG TAB: 10 mg | ORAL | Qty: 1

## 2020-08-12 NOTE — Progress Notes (Signed)
Rounding Note: Pt needing oxygen with ambulation, will need pt/ot at discharge. ? Discharge back to [facility] with [facility].

## 2020-08-12 NOTE — Progress Notes (Signed)
Cm asked RN to call report to Alejandro [name] to see if they can take him back with [facility]. PT is saying snf but snf beds are hard to find lately.

## 2020-08-12 NOTE — Progress Notes (Signed)
THERAPY TREATMENT NOTE  Mental Status  Neurologic State: Alert     Cognition: Follows commands;Memory loss;Appropriate for age attention/concentration        Safety/Judgement: Decreased awareness of need for safety;Decreased awareness of environment  Pain:                    Time Calculation: 64 mins  Bed/Mat Mobility     Supine to Sit: Bed Modified;Minimum assistance (slightly raised head of bed)     Sit to Stand: Minimum assistance  Scooting: Stand-by assistance  Balance  Balance  Sitting: Impaired;With support  Sitting - Static: Fair (occasional)  Sitting - Dynamic: Poor (constant support)  Standing: Impaired;With support  Standing - Static: Fair  Standing - Dynamic : Fair  Teacher, early years/pre  Sit to Stand: Minimum assistance  Stand to Sit: Minimum assistance     Bed to Chair: Contact guard assistance                               Wheelchair Management                        Gait Training        Weight Bearing Status        Gait                                              Cognitive Retraining  Cognitive Retraining  Safety/Judgement: Decreased awareness of need for safety;Decreased awareness of environment  Feeding  Feeding  Feeding Assistance: Set-up;Modified independent  Toileting     Grooming     Upper Body Bathing     Lower Body Bathing     Upper Body Dressing Assistance     Lower Body Dressing Assistance     Upper extremity assessment:     Lower extremity assessment:              Subjective: Patient resting in bed, just finished his snack. He is agreeable to do activity. He reports his muscles feel weak.  Change in Medical Condition: No oxygen on patient and is saturating 91-92%  Assessment: Patient will continue to benefit from skilled therapy to address remaining functional deficits: Decreased activity tolerance, decreasedstandingbalance/tolerance, decreased ADL independence and decreased functional mobility/transfers. Patient is demonstrating improvement in tolerating increased activities and not needing  additional oxygen. He declined to do simple ADL and was perseverating on getting his face shaved. OT educated RN regarding this and they were taking care of it at the end of session getting the equipment to do it. He did complete steps to the chair with CGA and use of 2WW. He initially had decreased balance in standing with posterior lean and OT assisted minimally. He struggled to sit upright at EOB without support and had a hunched over posture when he couldn't be upright any longer. Upper extremity exercises completed in the chair for the back support and even reclined position for task completion. He initially completed all exercises, 10 repetitions with no weight and was asking for more of a challenge. Weights brought to therapist to attempt the exercises again with one pound in each hand. He continued to push himself and wanting to work hard on his muscle weakness. Significant education given to patient regarding pausing for rest and pacing  as he was just being weaned off the oxygen. He was able to complete transfer and exercises without oxygen today. During the transfer he was 90-91% on room air and then with exercises he was 96-98% oxygen saturation. His heart rate was 65-70 during the session. Patient needed consistent education regarding goals of therapy and his progression. Patient is motivated to get better and is looking forward in trying physical therapy later.   Plan: Update activities as tolerated  Therapeutic Exercises: yes   Upper Extremity General Therapeutic Exercises  Activity/Exercise Type Repetitions & Sets   Position performed Weight (lbs)   Bilateral Chess press 1x10, 1x20 Seated reclined 1 lb second set   Bilateral shoulder flexion/extension 1x10, 1x20 Seated, reclined 1lb second set   Bilateral Shoulder abduction/adduction 1x10, 1x20 Seated, reclined 1lb second set                             Bed alarm activated: N/A  TABS unit applied: YES  Call bell within reach: YES  Urinal within reach  or Purwick/Foley Catheter in place: YES  Transportation Plan: wheelchair [employer] vs private vehicle  Communicated with: RN Molli Hazard, PTA Carney Bern  Handouts: NA    PT    OT Therapy Comments: SNF-[facility]     OT Plan of Care: 6 times/week    Therapist: Armanda Magic, OT

## 2020-08-12 NOTE — Progress Notes (Signed)
Bedside and Verbal shift change report given to MATT,RN (oncoming nurse) by R.MOTT,RN (offgoing nurse). Report included the following information SBAR, Kardex, MAR and Cardiac Rhythm AV PACED

## 2020-08-12 NOTE — Progress Notes (Signed)
Rella from [place] called, I spoke with her regarding plan and patient status. Updated her on patient's need for O2 with ambulation and tentative plan for discharge back to [place] with home health services. Informed her that the plan would be further developed tomorrow between charge nurse and care management.

## 2020-08-12 NOTE — Progress Notes (Signed)
Hospitalist Progress Note    Subjective:   CC: "I'm feeling much better"    S: Pt had removed his O2 when I came into the room.  Check his sats and he was 95% on RA.  Denies SOB.  Wants to know when he can return home.    O:  Physical Exam  Vitals: Reviewed  General:  No acute distress.  Nontoxic.  Alert.    Eyes:  No scleral icterus.  No conjunctival injection.  Tracking with eyes.  Pupils equally round  HEENT:  Normocephalic. Atraumatic. No angioedema.  Voice is not muffled.  Neck:   No JVD.  No stridor  Cardiovascular:  Regular rate and rhythm.  No anasarca.  No lower extremity edema  Respiratory:  Speaks in full sentences.  No accessory muscle usage noted.  Work of breathing appears unlabored.  Acyanotic.  Lungs clear to auscultation bilaterally.  Abdomen/GI:  Soft, non-tender.  No rebound or guarding, bowel sounds present.  MSK:  All limbs are noted to be moving spontaneously  Integumentary:  Does not appear diaphoretic.  No pallor  Neuro:  Symmetric face.  Fluent speech.  Follows commands.  No focal motor deficits noted  Psych:  Cooperative.  Pleasant.  Does not appear to be responding to internal stimuli.  Normal mood      A/P    Acute hypoxemic respiratory failure due to COVID-19 (HCC) - OFF ISOLATION ON 08/12/20  Was on RA upon eval today.  I did stand him at the bedside on RA and he dropped to 82%.  7/10, Decadron - oral  7/14, Baricitinib  Remdesivir completed   Maintain oxygen saturation around 92%    Generalized muscle weakness  PT and OT consulted - rec SNF but maybe able to go back to memory care w/ home health  ??  Type 1 diabetes mellitus with hyperglycemia, with long-term current use of insulin (HCC)  BID Lantus with sliding scale, hyperglycemia due to Decadron,   Increase AM latus to 45u  ??  DNR/DNI  DIET - Cardiac  DVT -  Eliquis         Visit Vitals  BP (!) 116/59 (BP 1 Location: Right upper arm, BP Patient Position: At rest)   Pulse 60   Temp 97.5 ??F (36.4 ??C)   Resp 18   Ht 5\' 11"  (1.803 m)    Wt 67.9 kg (149 lb 11.1 oz)   SpO2 91%   BMI 20.88 kg/m??    O2 Flow Rate (L/min): 0 l/min O2 Device: None (Room air)    Temp (24hrs), Avg:97.5 ??F (36.4 ??C), Min:97.4 ??F (36.3 ??C), Max:97.8 ??F (36.6 ??C)      10/05 0701 - 10/05 1900  In: 480 [P.O.:480]  Out: -   10/03 1901 - 10/05 0700  In: 500 [P.O.:500]  Out: 2140 [Urine:2140]       I reviewed labs below:   Labs: Results:       Chemistry Recent Labs     08/10/20  1133   GLU 124*   NA 137   K 4.1   CL 106   CO2 22   BUN 20   CREA 0.79   CA 8.2*   AGAP 13   BUCR 25   AP 56   TP 6.2   ALB 2.6*   GLOB 3.6   AGRAT 0.7      CBC w/Diff No results for input(s): WBC, RBC, HGB, HCT, PLT, GRANS, LYMPH, EOS, RETIC, HGBEXT, HCTEXT, PLTEXT  in the last 72 hours.   Coagulation No results for input(s): PTP, INR, APTT, INREXT in the last 72 hours.    Liver Enzymes Recent Labs     08/10/20  1133   TP 6.2   ALB 2.6*   AP 56   resultrcnt{24h@       ~~~~~~~~~        Data Review    Recent Results (from the past 12 hour(s))   GLUCOSE, POC    Collection Time: 08/12/20  6:54 AM   Result Value Ref Range    Glucose (POC) 139 (H) 75 - 110 mg/dL    Performed by Cox Felicia    GLUCOSE, POC    Collection Time: 08/12/20 11:05 AM   Result Value Ref Range    Glucose (POC) 256 (H) 75 - 110 mg/dL    Performed by Cox Felicia    GLUCOSE, POC    Collection Time: 08/12/20  4:15 PM   Result Value Ref Range    Glucose (POC) 226 (H) 75 - 110 mg/dL    Performed by Audree Bane          Signed By: Antony Madura, DO     August 12, 2020       I apologize for any spelling or grammatical errors I may have overlooked while proof-reading.  If there are any questions or confusion please contact the hospitalist office at [phone]

## 2020-08-12 NOTE — Progress Notes (Signed)
DECLINED, HOLD, AND/OR NOT APPROPRIATE    Patient was not seen for skilled therapy treatment, secondary to  Explain: pt is working with OT Erin at this time , will come back this afternoon   Plan: Will follow up as schedule permits.  Therapist: Conley Canal PTA

## 2020-08-12 NOTE — Progress Notes (Signed)
Problem: Falls - Risk of  Goal: *Absence of Falls  Description: Document Alejandro [name] Fall Risk and appropriate interventions in the flowsheet.  Outcome: Progressing Towards Goal  Note: Fall Risk Interventions:  Mobility Interventions: Bed/chair exit alarm, Patient to call before getting OOB, Utilize walker, cane, or other assistive device, Utilize gait belt for transfers/ambulation    Mentation Interventions: Bed/chair exit alarm, Gait belt with transfers/ambulation    Medication Interventions: Bed/chair exit alarm, Patient to call before getting OOB, Teach patient to arise slowly    Elimination Interventions: Bed/chair exit alarm, Call light in reach, Stay With Me (per policy), Urinal in reach              Problem: Fatigue  Goal: Verbalize increase energy and improved vitality  Outcome: Progressing Towards Goal

## 2020-08-12 NOTE — Progress Notes (Signed)
 THERAPY TREATMENT NOTE  Mental Status                    Pain:  Pain Scale 1: Numeric (0 - 10)  Pain Intensity 1: 0                 Bed/Mat Mobility        Sit to Supine: Supervision  Sit to Stand: Contact guard assistance  Scooting: Supervision  Balance  Balance  Sitting: Intact;With support  Sitting - Static: Fair (occasional)  Sitting - Dynamic: Fair (occasional)  Standing: Impaired;With support  Standing - Static: Fair  Standing - Dynamic : Fair  Teacher, early years/pre  Sit to Stand: Contact guard assistance  Stand to Sit: Contact guard assistance                       Level of Assistance: Contact guard assistance (to stand at sink )  Interventions: Verbal cues;Tactile cues;Safety awareness training (to reach back prior to sitting )          Wheelchair Management                        Gait Training  Gait Training: Yes  Assistive Device: Walker, rolling  Weight Bearing Status        Gait  Base of Support: Widened  Speed/Cadence: Pace decreased (<100 feet/min)  Step Length: Left shortened;Right shortened        Gait Abnormalities: Decreased step clearance  Ambulation - Level of Assistance: Contact guard assistance  Distance (ft): 65 Feet (ft)                      Cognitive Retraining     Feeding     Toileting     Grooming     Upper Body Bathing     Lower Body Bathing     Upper Body Dressing Assistance     Lower Body Dressing Assistance     Upper extremity assessment:     Lower extremity assessment:              Subjective: pt presents up in recliner , expresses he is motivated to participate in PT , is now on room air at 93% saturation , pt has been dc from covid precautions is being transferred to 512 , pt states he was able to shave , he would like to stand at the sink and comb his hair and see what I look like  .  Change in Medical Condition: pt is off covid precautions   Assessment: Patient will continue to benefit from skilled therapy to address remaining functional deficits: dec in general strength and  endurance , transfer and gait training , balance deficits (sitting and standing ) . Pt is a pleasant man , is motivated to participate in PT, requires cueing for taking it slow and for pacing during recovery  , pt was able to stand off recliner with CGA , pt stood at sink unsupported in 2ww to comb hair w/ CGA, pt used hands to wet hair down prior to combing it , had no LOB or sway at sink . Denied a seated rest prior prior to ambulating . Pt ambulated an increase in distance to 65 ft ,  demonstrating a wide BOS , recip gait pattern ,  vc for slow pace , stable using walker w/ CGA, had a chair follow for rests if needed ,  had pt sit in chair for RN to prep 512 for pt . pts oxygen dec to 86-87% on room air, recovered to 90% while resting  , pt performed a stand pivot transfer chair to bed w/ CGA, able to lift LE onto bed w/ supervision , pt reported labored breathing , spo2 87% , RN Matt placed pt on 2L of oxygen to recover from ambulation . Pt remained in supine expressing fatigue , if pt continues to make consistent  progress he would be a candidate for return to [facility] using a 2ww, SNF will cont to be recommended  at this time   Plan: Continue plan of care  Therapeutic Exercises: no, due to fatigue , seated /standing LE HEP's  in room   Bed alarm activated: YES  TABS unit applied: NO  Call bell within reach: YES  Urinal within reach or Purwick/Foley Catheter in place: YES  Transportation Plan: [facility]   Communicated with: RN Adina, OT Erin   Handouts: seated /standing HEPs brought  to room , will instruct when approp    PT Therapy Comments: SNF-[facility], HH pontential , may need 2ww if home , PTA yes,   OT Therapy Comments: SNF[facility]  PT Plan of Care: 7 times/week  Therapist: Cy Brasil PTA

## 2020-08-13 LAB — GLUCOSE, POC
Glucose (POC): 231 mg/dL — ABNORMAL HIGH (ref 75–110)
Glucose (POC): 123 mg/dL — ABNORMAL HIGH (ref 75–110)
Glucose (POC): 337 mg/dL — ABNORMAL HIGH (ref 75–110)
Glucose (POC): 269 mg/dL — ABNORMAL HIGH (ref 75–110)

## 2020-08-13 LAB — POCT GLUCOSE
POC Glucose: 231 mg/dL — ABNORMAL HIGH (ref 75–110)
POC Glucose: 123 mg/dL — ABNORMAL HIGH (ref 75–110)
POC Glucose: 337 mg/dL — ABNORMAL HIGH (ref 75–110)
POC Glucose: 269 mg/dL — ABNORMAL HIGH (ref 75–110)

## 2020-08-13 MED ORDER — INSULIN ASPART 100 UNIT/ML (3 ML) SUB-Q PEN
100 unit/mL (3 mL) | Freq: Three times a day (TID) | SUBCUTANEOUS | Status: DC
Start: 2020-08-13 — End: 2020-08-14
  Administered 2020-08-13 – 2020-08-14 (×4): via SUBCUTANEOUS

## 2020-08-13 MED FILL — DONEPEZIL 5 MG TAB: 5 mg | ORAL | Qty: 1

## 2020-08-13 MED FILL — ROSUVASTATIN 10 MG TAB: 10 mg | ORAL | Qty: 1

## 2020-08-13 MED FILL — THERAPEUTIC MULTIVITAMIN TAB: ORAL | Qty: 1

## 2020-08-13 MED FILL — ELIQUIS 2.5 MG TABLET: 2.5 mg | ORAL | Qty: 1

## 2020-08-13 MED FILL — LEVOTHYROXINE 50 MCG TAB: 50 mcg | ORAL | Qty: 1

## 2020-08-13 MED FILL — TAMSULOSIN SR 0.4 MG 24 HR CAP: 0.4 mg | ORAL | Qty: 1

## 2020-08-13 MED FILL — QUETIAPINE 25 MG TAB: 25 mg | ORAL | Qty: 2

## 2020-08-13 MED FILL — AMLODIPINE 5 MG TAB: 5 mg | ORAL | Qty: 2

## 2020-08-13 MED FILL — OLUMIANT 2 MG TABLET: 2 mg | ORAL | Qty: 2

## 2020-08-13 MED FILL — LISINOPRIL 10 MG TAB: 10 mg | ORAL | Qty: 1

## 2020-08-13 MED FILL — PANTOPRAZOLE 40 MG TAB, DELAYED RELEASE: 40 mg | ORAL | Qty: 1

## 2020-08-13 MED FILL — DEXAMETHASONE 1 MG TAB: 1 mg | ORAL | Qty: 2

## 2020-08-13 NOTE — Discharge Summary (Signed)
Physician Discharge Summary       Patient: Alejandro [name] MRN: [id number]  SSN: [id number]    Date of Birth: 1941-03-28  Age: 79 y.o.  Sex: male    PCP: Ramon Dredge, FNP    Admit date: 08/06/2020  Admitting Provider: Delorise Royals, MD  Discharge date: 08/14/20  Discharging Provider: Nile Dear Lorenz Donley, DO  Subjective for day of discharge:  "I'm feeling good."  Wants to "go home."  Denies fever, chills, SOB.  Is able to ambulate short distances w/ O2.      * Discharge Diagnoses:     COVID-19 Pneumonia  Acute Hypoxic Respiratory Failure secondary to COVID-19  Type 1 Diabetes    * Hospital Course:     Alejandro [name] is a 79 y.o. male w/ PMHx type 1 diabetes, HTN, and dementia who presented with acute hypoxic respiratory failure secondary to COVID-19.  He was admitted to the hospital, started on Baricitinib, Remdesivir, and decadron.  Initially his oxygenation slightly worsened and then rapidly improved.  He currently only needs 2lpm for exertion/ambulation.  He is ready for discharge back to his facility.  He will have home health come see him to continue with physical and occupational therapy.      He has completed his quarantine.      Patient was informed that due to the COVID19 pandemic to remain in self-isolation, obtain vaccine if not received already, and practice social distancing for yourself and encourage family / friends to do similar.     If you experience new or worsening symptoms (including but not limited to: fever, chills, chest pain / discomfort / pressure / tightness, shortness of breath, unusual heart beat sensation, dizziness, passing out, headache, seizures, confusion, weakness, severe aches, loss of strength or sensation of your limbs, change in your hearing/vision/speech, vomiting, abdominal pain, diarrhea, change in urinary habits or patterns, bleeds, rash, blistering of mouth, swelling of tongue/lips/throat or any significant pain/discomfort/loss of function in general) seek medical care  and return to the emergency room.    They understood the information provided.    Micro results for this hospitalization:  Results     Procedure Component Value Units Date/Time    CULTURE, BLOOD,  2ND DRAW [[id number]] Collected: 08/06/20 0848    Order Status: Completed Specimen: Whole Blood Updated: 08/11/20 0811     Special Requests: --     NO SPECIAL REQUESTS  RIGHT  HAND       Culture result: NO GROWTH 5 DAYS    CULTURE, BLOOD [[id number]] Collected: 08/06/20 0838    Order Status: Completed Specimen: Whole Blood Updated: 08/11/20 0811     Special Requests: --     NO SPECIAL REQUESTS  RIGHT  ARM       Culture result: NO GROWTH 5 DAYS          XR CHEST PA LAT    Result Date: 08/06/2020  New appearance of ground-glass opacities involving the bilateral lungs.  The appearance would be consistent with an atypical infection, but in the acute setting could also be seen secondary to pulmonary edema or pulmonary hemorrhage.  Viral pneumonia could certainly have this appearance.     CTA CHEST W OR W WO CONT    Result Date: 08/06/2020  1. The lung abnormalities these may be related to COVID infection but deserves outpatient imaging follow-up to ascertain stability or resolution. 2. No evidence of acute pulmonary embolism. 3. Lymph nodes are likely reactive. 4. 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 5. Borderline cardiomegaly. 6. Small hiatal hernia. 7. Left nephrolithiasis. 8. Few other findings detailed above.       Recent Results (from the past 24 hour(s))   GLUCOSE, POC    Collection Time: 08/12/20  9:00 PM   Result Value Ref Range    Glucose (POC) 231 (H) 75 - 110 mg/dL    Performed by [employer]    GLUCOSE, POC    Collection Time: 08/13/20  7:19 AM   Result Value Ref Range    Glucose (POC) 123 (H) 75 - 110 mg/dL    Performed by Audree Bane    GLUCOSE, POC    Collection Time: 08/13/20 11:22 AM   Result Value Ref Range    Glucose (POC) 337 (H) 75 - 110 mg/dL    Performed by Berna Spare    GLUCOSE, POC    Collection Time: 08/13/20  4:35 PM   Result Value Ref Range    Glucose (POC) 269 (H) 75 - 110 mg/dL    Performed by Audree Bane        Discharge Exam:  Alert, NAD  RRR no RMGT  CTAB  Abdomen S/NT +BS  NO LE edema.    Vitals: Reviewed  Visit Vitals  BP 123/60 (BP 1 Location: Left upper arm, BP Patient Position: At rest)   Pulse 61   Temp 97.8 ??F (36.6 ??C)   Resp 20   Ht 5\' 11"  (1.803 m)   Wt 69 kg (152 lb 1.9 oz)   SpO2 93%   BMI 21.22 kg/m??     O2 Flow Rate (L/min): 2 l/min O2 Device: Nasal cannula      Discharge Medications:  Current Discharge Medication List      CONTINUE these medications which have NOT CHANGED    Details   bisacodyL (DULCOLAX) 10 mg supp Insert 10 mg into rectum daily as needed for Constipation.      ibuprofen (MOTRIN) 400 mg tablet Take 400 mg by mouth every six (6) hours as needed for Pain.      glucose blood VI test strips (Prodigy No Coding) strip USE 1 STRIP TO TEST BLOOD GLUCOSE 4 TIMES DAILY.  Qty: 120 Strip, Refills: 11      insulin glargine (Basaglar KwikPen U-100 Insulin) 100 unit/mL (3 mL) inpn Inject 38 units Am and 10 units PM subq E10.65  Indications: type 1 diabetes mellitus  Qty: 15 mL, Refills: 11    Associated Diagnoses: Type 2 diabetes mellitus treated with insulin (HCC)      Eliquis 2.5 mg tablet TAKE 1 TABLET BY MOUTH TWICE DAILY.  Qty: 60 Tablet, Refills: 11      rosuvastatin (CRESTOR) 10 mg tablet Take 1 Tablet by mouth nightly.  Qty: 90 Tablet, Refills: 0      insulin aspart U-100 (NOVOLOG) 100 unit/mL (3 mL) inpn Inject 7 units breakfast, lunch, supper plus correction (3 units for every 30 points over 150 to max of 9 extra units)  Qty: 15 mL, Refills: 11      pen needle,diabetic dual safty (BD AutoShield Duo Pen Needle) 30 gauge x 3/16" ndle For use with insulin pens 5 times daily  Qty: 200 Pen Needle, Refills: 11      levothyroxine (SYNTHROID) 50 mcg tablet Take 1 Tablet by mouth Daily (before breakfast).  Qty: 90 Tablet, Refills: 0     Comments: MAR purposes only, refill as needed      pantoprazole (PROTONIX) 40 mg tablet  Take 1 Tablet by mouth daily.  Qty: 90 Tablet, Refills: 0    Comments: MAR purposes only, refill as needed      tamsulosin (Flomax) 0.4 mg capsule Take 1 Capsule by mouth daily.  Qty: 90 Capsule, Refills: 0    Comments: Please add to MAR      multivitamin (ONE A DAY) tablet Take 1 Tablet by mouth daily.  Qty: 90 Tablet, Refills: 3    Comments: Please add to [facility]      OTHER Monitor Blood Glucose 4 times daily, before meals and at 8PM  Qty: 1 Each, Refills: 0    Comments: MAR purposes only. please include space to document the site of glucose monitoring      QUEtiapine (SEROquel) 50 mg tablet Take 1 Tablet by mouth two (2) times a day.  Qty: 180 Tablet, Refills: 0      lancets misc Use one lancet to test blood glucose 4 times daily  Qty: 100 Each, Refills: 3      Insulin Syringe-Needle U-100 0.3 mL 30 syrg 1 Each by SubCUTAneous route four (4) times daily.  Qty: 100 Syringe, Refills: 3      amLODIPine (NORVASC) 10 mg tablet Take 1 Tab by mouth daily. Indications: high blood pressure  Qty: 90 Tab, Refills: 3    Associated Diagnoses: Essential hypertension      acetaminophen (TYLENOL) 325 mg tablet Take 650 mg by mouth every four (4) hours as needed for Pain or Fever (tamp > 100).      magnesium citrate solution Take 296 mL by mouth now. Prn if no results after enema. If no results in 1 hour of completion of bowl protocol, call MD immediately for further orders.       magnesium hydroxide (Phillips Milk of Magnesia) 400 mg/5 mL suspension Take 30 mL by mouth nightly as needed for Constipation (If no BM for 3 days). Prn if no BM in 3 days      benazepriL (LOTENSIN) 5 mg tablet Take 10 mg by mouth daily.      cetirizine (ZYRTEC) 10 mg tablet Take 10 mg by mouth daily.      donepeziL (ARICEPT) 5 mg tablet Take 5 mg by mouth nightly.      glucagon (GlucaGen HypoKit) 1 mg injection 1 mg by IntraVENous route once.             Allergies    Allergen Reactions   ??? Shellfish Derived Anaphylaxis       Follow-up Information     Follow up With Specialties Details Why Contact Info    [facility]   [address]  [address]  [place] [address]  [phone]    Ramon Dredge, FNP Internal Medicine   [address] [place] [address]  [phone]            Code status for the hospitalization: DNR    * Follow-up Care/Patient Instructions:  Activity:  As tolerated  Diet: Diabetic Diet   * Discharge Condition:  Stable  * Disposition:  Home with home health    Time Spent Discharging (including direct patient care, counseling and coordination of the discharge plan w RN team & case management): Greater than 30 minutes    Signed:  Nile Dear Georgann Bramble, DO    I apologize for any spelling or grammatical errors I may have overlooked while proof-reading.  If there are any questions or confusion please contact the hospitalist office  at [phone]

## 2020-08-13 NOTE — Progress Notes (Signed)
Hospitalist Progress Note    Subjective:   CC: "When will I be better?"    S: Still mildly SOB w/ ambulation.  Very fatigued w/ movement.  Denies CP.  Appetite good.  Sleeping well.    O:  Physical Exam  Vitals: Reviewed  General:  No acute distress.  Nontoxic.  Alert.   Eyes:  No scleral icterus.  No conjunctival injection.  Tracking with eyes.  Pupils equally round  HEENT:  Normocephalic. Atraumatic. No angioedema.  Voice is not muffled.  Neck:   No JVD.  No stridor  Cardiovascular:  Regular rate and rhythm.  No anasarca.  No lower extremity edema  Respiratory:  Speaks in full sentences.  No accessory muscle usage noted.  Work of breathing appears unlabored.  Acyanotic.  Lungs clear to auscultation bilaterally.  Abdomen/GI:  Soft, non-tender.  No rebound or guarding, bowel sounds present.  MSK:  All limbs are noted to be moving spontaneously  Integumentary:  Does not appear diaphoretic.  No pallor  Neuro:  Symmetric face.  Fluent speech.  Follows commands.  No focal motor deficits noted  Psych:  Cooperative.  Pleasant.  Does not appear to be responding to internal stimuli.  Normal mood      A/P    Acute hypoxemic respiratory failure due to COVID-19 (HCC) - OFF ISOLATION ON 08/12/20  Was on RA upon eval today sitting at the bedside eating breakfast.  Unfortunately he was hypoxic to 85%.  I placed him on 2lpm NC and he improved to 88%.  I increased it to 3lpm.  He is still hypoxic w/ any exertion.  Will continue to monitor and work to wean O2.  8/10, Decadron??- oral  8/14, Baricitinib  Remdesivir completed??  Maintain oxygen??saturation around??92%  ??  Generalized muscle weakness  PT and OT - rec SNF but maybe able to go back to memory care w/ home health  ??  Type 1 diabetes mellitus with hyperglycemia, with long-term current use of insulin (HCC)  BID Lantus with sliding scale, hyperglycemia due to Decadron,   Increase AM latus to 45u  BS 123-256 - highest after lunch and lowest before breakfast.  Will continue w/ 45u  qAM and 20u qPM.  Will add 3u to meal coverage PLUS the correctional scale as it appears he increases w/ meals.      DNR/DNI  DIET - Cardiac  DVT - ??Eliquis     Visit Vitals  BP (!) 142/77 (BP 1 Location: Left upper arm, BP Patient Position: At rest)   Pulse 60   Temp 98 ??F (36.7 ??C)   Resp 18   Ht 5\' 11"  (1.803 m)   Wt 69 kg (152 lb 1.9 oz)   SpO2 94%   BMI 21.22 kg/m??    O2 Flow Rate (L/min): 0 l/min O2 Device: None (Room air)    Temp (24hrs), Avg:97.7 ??F (36.5 ??C), Min:97.4 ??F (36.3 ??C), Max:98 ??F (36.7 ??C)      No intake/output data recorded.  10/04 1901 - 10/06 0700  In: 860 [P.O.:860]  Out: 1450 [Urine:1450]       I reviewed labs below:   Labs: Results:       -Chemistry Recent Labs     08/10/20  1133   GLU 124*   NA 137   K 4.1   CL 106   CO2 22   BUN 20   CREA 0.79   CA 8.2*   AGAP 13   BUCR 25  AP 56   TP 6.2   ALB 2.6*   GLOB 3.6   AGRAT 0.7      CBC w/Diff No results for input(s): WBC, RBC, HGB, HCT, PLT, GRANS, LYMPH, EOS, RETIC, HGBEXT, HCTEXT, PLTEXT in the last 72 hours.   Coagulation No results for input(s): PTP, INR, APTT, INREXT in the last 72 hours.    Liver Enzymes Recent Labs     08/10/20  1133   TP 6.2   ALB 2.6*   AP 56   resultrcnt{24h@       ~~~~~~~~~        Data Review    Recent Results (from the past 12 hour(s))   GLUCOSE, POC    Collection Time: 08/12/20  9:00 PM   Result Value Ref Range    Glucose (POC) 231 (H) 75 - 110 mg/dL    Performed by [employer]    GLUCOSE, POC    Collection Time: 08/13/20  7:19 AM   Result Value Ref Range    Glucose (POC) 123 (H) 75 - 110 mg/dL    Performed by Audree Bane          Signed By: Antony Madura, DO     August 13, 2020       I apologize for any spelling or grammatical errors I may have overlooked while proof-reading.  If there are any questions or confusion please contact the hospitalist office at [phone]

## 2020-08-13 NOTE — Progress Notes (Signed)
DECLINED, HOLD, AND/OR NOT APPROPRIATE  08/13/2020  Patient was not seen for skilled therapy treatment, secondary to  Explain: pt just walked with RT Misty Stanley to determine oxygen needs for home use , pt expressed he would like time to rest ,  asked this therapist to come back , pt remained on 2L   Plan: Will follow up as schedule permits.  Therapist: Conley Canal

## 2020-08-13 NOTE — Progress Notes (Signed)
[facility] can't see pt until Saturday and Dr would like someone in sooner. CM e-mailed [facility] about what their availability is.

## 2020-08-13 NOTE — Progress Notes (Signed)
Patient to discharge to [facility]; report given to Tammy at facility. Report included the following information SBAR, Intake/Output and MAR.     Patient to be set up with home oxygen; Tammy at facility will call back once oxygen arrives.

## 2020-08-13 NOTE — Progress Notes (Signed)
Evaluation for Home Oxygen    Resting (Room Air)  SpO2:  93  HR:  65  During Walk (Room Air)  SpO2:  86  HR:  111    During Walk (On O2)  SpO2:91    HR:  99  O2 Device:  Nasal Cannula  O2 Flow Rate (L/min):  2    After Walk  SpO2:  93  HR:  84  O2 Device:  nasal cannula  O2 Flow Rate (L/min):  2      Additional Comments: Patient required 2 LPM via NC during ambulation to maintain saturation of 90%     Electronically signed by Rockney Ghee, RT on 08/13/2020 at 1:58 PM

## 2020-08-13 NOTE — Progress Notes (Signed)
Patient alert and oriented to person and place; calm and cooperative. Patient fixated on discharge and where he will be going. Patient encouraged to stay calm and relax. Informed no discharge orders have been placed at this time. Call bell within reach; no further needs at this time.

## 2020-08-13 NOTE — Progress Notes (Signed)
 DECLINED, HOLD, AND/OR NOT APPROPRIATE  08/13/2020  Patient was not seen for skilled therapy treatment, secondary to pt declined. Explain: pt ate breakfast ,  states he is still hungry  , has been ringing out consistently asking for more food , RN Molly to call kitchen , will come back once pt has eaten   Plan: Will follow up as schedule permits.  Therapist: Cy Brasil PTA

## 2020-08-13 NOTE — Progress Notes (Signed)
Pt is off precautions.   No snf beds in the area. Pt lives at assisted living.   CM made referral to [facility]. RN will call report to [name] to see when they can take pt back.

## 2020-08-13 NOTE — Progress Notes (Signed)
DME Type :   Home O2   2L continuous     Order Received From:   RT -Stacey Bishop     Did Patient have a preference of DME company No     Company Order was sent to : [employer]     Estimated timeframe for delivery:   1 hour     Delivery to :   Portable tank to patient's room   Concentrator to home

## 2020-08-13 NOTE — Progress Notes (Signed)
 Progress Notes  by Tod Gunner at 08/13/20 1544                Author: Tod Gunner  Service: --  Author Type: Physical Therapy Assistant       Filed: 08/13/20 1602  Date of Service: 08/13/20 1544  Status: Signed          Editor: Tod Gunner (Physical Therapy Assistant)               THERAPY TREATMENT NOTE       Mental Status                           Pain:                               Bed/Mat Mobility       Supine to Sit: Stand-by assistance;Additional time;Bed Modified (HOB slightly raised )   Sit to Supine: Stand-by assistance;Additional time   Sit to Stand: Minimum assistance (vc for hand placement / push off )   Scooting: Stand-by assistance   Balance   Balance   Sitting: Impaired;Without support (pt leaning posterior )   Sitting - Static: Fair (occasional)   Sitting - Dynamic: Fair (occasional)   Standing: Impaired;With support   Standing - Static: Fair   Standing - Dynamic : Fair   Teacher, early years/pre   Sit to Stand: Minimum assistance (vc for hand placement / push off )   Stand to Sit: Contact guard assistance                                   Interventions: Verbal cues;Tactile cues;Safety awareness training (for hand placement to sit/stand)              Wheelchair Management                                Gait Training   Gait Training: Yes   Assistive Device: Walker, rolling   Weight Bearing Status           Gait   Base of Support: Widened   Speed/Cadence: Pace decreased (<100 feet/min)   Step Length: Left shortened;Right shortened           Gait Abnormalities: Decreased step clearance   Ambulation - Level of Assistance: Contact guard assistance   Distance (ft): 70 Feet (ft)                              Cognitive Retraining       Feeding       Toileting       Grooming       Upper Body Bathing       Lower Body Bathing       Upper Body Dressing Assistance       Lower Body Dressing Assistance       Upper extremity assessment:       Lower extremity assessment:                     Subjective: pt presents  supine resting in bed, reports he is tired today   , is on 2L spo2 at 93% saturation , pt is pleasantly confused ,  repeating questions, asking about dc and where he will go  , does not recall where he lives , did not remember living at [facility] ,, who pays for it ? .  pt is concerned about when he will leave , asks about oxygen , why he has to wear it    Change in Medical Condition: n/a   Assessment: Patient will continue to benefit from skilled therapy to address remaining functional deficits: dec in general strength and tolerance  to activity , bed mobility , transfer and gait training , balance deficits , . Pt is pleasant and motivated to participate in therapy despite reports of fatigue , pt is generally weak with a dec in oxygen saturation with mobility . Was able to keep levels  >88% on 2L with recovery fairly quickly to 90-91% . Was able to transfer slowly to sitting EOB w/o assistance . Requires repeated cueing for hand placement to stand /sit for easier transitional movements , required min assist today due to fatigue. Pt  ambulated 70 ft ,partial step through gait pattern, wide BOS , dec step length /height , was stable with fair balance and  CGA , required vc to keep feet inside walker with turning , pts oxygen 88% with ambulation , sat EOB to recover and stated I need  to lie down , pt able to lift LE onto bed actively and position in middle of bed for comfort ,  Was instructed on supine LE therapy exercises w/ a HEP provided for reference , pt asked repeated questions on form , performed 2-3 reps of each for understanding  , pt stated motivation and  Compliance  To perform  once rested . Pt remained in supine to rest . Is  A candidate for rehab at SNF due to the above deficits , pt's confusion and  oxygen requirements, , if pt is to go back to [facility] he would require  24/7 supervision for oxygen tubing and to ensure use of 2ww, as pt is a high fall risk ,    Plan: Continue plan of care    Therapeutic Exercises: yes, see  below    Bed alarm activated: YES   TABS unit applied: NO   Call bell within reach: YES   Urinal within reach or Purwick/Foley Catheter in place: YES   Transportation Plan: [facility]    Communicated with: RN Sari   Handouts:               PT Therapy Comments: SNF-UA, has potential for HH  , PTA yes, may need a 2ww   OT Therapy Comments: SNF-UA   PT Plan of Care: 7 times/week          Therapist: Cy Brasil PTA

## 2020-08-13 NOTE — Progress Notes (Signed)
[facility] could see pt tomorrow for PT but [facility] won't let PT in their building until at least Friday. They will get in to see pt as soon as [facility] will allow. Referral faxed. POA updated by phone. CM coordinator is working with [employer] to get O2 delivered.     Pt will discharge to [facility] today with [facility] and home O2 through [place].

## 2020-08-13 NOTE — Progress Notes (Signed)
THERAPY TREATMENT NOTE  Mental Status  Neurologic State: Alert     Cognition: Appropriate for age attention/concentration;Appropriate safety awareness        Safety/Judgement: Insight into deficits  Pain:                    Time Calculation: 38 mins  Bed/Mat Mobility     Supine to Sit: Bed Modified;Contact guard assistance     Sit to Stand: Minimum assistance (off bed)  Scooting: Stand-by assistance  Balance  Balance  Sitting: Impaired;Without support  Sitting - Static: Fair (occasional)  Sitting - Dynamic: Fair (occasional)  Standing: Impaired;With support  Standing - Static: Fair  Standing - Dynamic : Fair  Teacher, early years/pre  Sit to Stand: Minimum assistance (off bed)  Stand to Sit: Contact guard assistance     Bed to Chair: Contact guard assistance                               Wheelchair Management                        Gait Training        Weight Bearing Status        Gait                                              Cognitive Retraining  Cognitive Retraining  Safety/Judgement: Insight into deficits  Feeding  Feeding  Feeding Assistance: Set-up;Modified independent  Toileting     Grooming     Upper Body Bathing     Lower Body Bathing     Upper Body Dressing Assistance     Lower Body Dressing Assistance  Lower Body Dressing Assistance  Pants With Button/Zipper: Moderate assistance  Socks: Moderate assistance  Leg Crossed Method Used: Yes (for donning socks)  Position Performed: Seated edge of bed  Cues: Verbal cues provided  Upper extremity assessment:     Lower extremity assessment:              Subjective: Patient resting in bed, agreeable to therapy. " I feel tired"  Change in Medical Condition: RN reports patient on 2L, however when in the room he was on 3L. Per MD note, he was desatting and it was bumped up to 3L  Assessment: Patient will continue to benefit from skilled therapy to address remaining functional deficits: Decreased activity tolerance, decreasedstandingbalance/tolerance, decreased ADL  independence and decreased functional mobility/transfers. Patient presents pretty lethargic today. He is agreeable to do what he can, however he repeatedly is stating " I feel tired today". Patient is now back on oxygen and at 96% at rest on 3 L. OT decreased to 2L and he was 91% consistently during all activities. He completed sock doff and don with ability to doff however donning required mod assist and several attempts. He would lose his balance to the left secondary to trunk weakness. He did don pants with mod assist to pull up over his hips secondary to standing balance. He did need min assist off the bed and CGA with 2ww to take steps to he chair. Lunch came and patient assisted briefly with setup.  Plan: Update activities as tolerated  Therapeutic Exercises: no  Bed alarm activated: NO  TABS unit applied: YES  Call  bell within reach: YES  Urinal within reach or Purwick/Foley Catheter in place: YES  Transportation Plan: wheelchair [employer] with: RN [name], PTA Carney Bern  Handouts: NA    PT    OT Therapy Comments: [facility]     OT Plan of Care: 5 times/week    Therapist: Armanda Magic, OT

## 2020-08-14 LAB — GLUCOSE, POC
Glucose (POC): 210 mg/dL — ABNORMAL HIGH (ref 75–110)
Glucose (POC): 355 mg/dL — ABNORMAL HIGH (ref 75–110)

## 2020-08-14 LAB — POCT GLUCOSE
POC Glucose: 210 mg/dL — ABNORMAL HIGH (ref 75–110)
POC Glucose: 355 mg/dL — ABNORMAL HIGH (ref 75–110)

## 2020-08-14 MED FILL — ELIQUIS 2.5 MG TABLET: 2.5 mg | ORAL | Qty: 1

## 2020-08-14 MED FILL — LEVOTHYROXINE 50 MCG TAB: 50 mcg | ORAL | Qty: 1

## 2020-08-14 MED FILL — AMLODIPINE 5 MG TAB: 5 mg | ORAL | Qty: 2

## 2020-08-14 MED FILL — THERAPEUTIC MULTIVITAMIN TAB: ORAL | Qty: 1

## 2020-08-14 MED FILL — LISINOPRIL 10 MG TAB: 10 mg | ORAL | Qty: 1

## 2020-08-14 MED FILL — ROSUVASTATIN 10 MG TAB: 10 mg | ORAL | Qty: 1

## 2020-08-14 MED FILL — QUETIAPINE 25 MG TAB: 25 mg | ORAL | Qty: 2

## 2020-08-14 MED FILL — OLUMIANT 2 MG TABLET: 2 mg | ORAL | Qty: 2

## 2020-08-14 MED FILL — PANTOPRAZOLE 40 MG TAB, DELAYED RELEASE: 40 mg | ORAL | Qty: 1

## 2020-08-14 MED FILL — DONEPEZIL 5 MG TAB: 5 mg | ORAL | Qty: 1

## 2020-08-14 MED FILL — TAMSULOSIN SR 0.4 MG 24 HR CAP: 0.4 mg | ORAL | Qty: 1

## 2020-08-14 MED FILL — DEXAMETHASONE 1 MG TAB: 1 mg | ORAL | Qty: 2

## 2020-08-14 NOTE — Progress Notes (Signed)
DME~Delivered a 2WW to patient in room prior to discharge.  [facility] form has been signed by MD and patient.    [employer]  Rehab Aide

## 2020-08-14 NOTE — Progress Notes (Signed)
Patient discharging back to [facility]; IV removed, site benign. Telemetry removed.  Patient escorted to facility with all belongings via wheel chair van.

## 2020-08-14 NOTE — Progress Notes (Signed)
Patient alert to self and place; calm and cooperative. Respirations even and non-labored on room air. Patient discharging to [facility] today. Call bell within reach; no further needs at this time.

## 2020-08-14 NOTE — Progress Notes (Signed)
DECLINED, HOLD, AND/OR NOT APPROPRIATE  08/14/2020  Patient was not seen for skilled therapy treatment, secondary to . Explain: pt is being placed in a wheel chair  , is being  dc back to [facility] at this time   Plan: DC to [facility]   Therapist: Conley Canal PTA

## 2020-08-14 NOTE — Progress Notes (Signed)
[facility] updated that pt didn't go yesterday but should go today. Addended d/c summary faxed to [facility].

## 2020-08-14 NOTE — Telephone Encounter (Signed)
Patient needs a hospital discharge follow up appointment.  Facility: [facility]  Date of ED Visit/Admission: 9/29  Date of Discharge: 10/7  Time frame needed to be seen: 1 week  Who needs to be contacted with appointment information (patient, nurse etc.): Patient is scheduled for 10/12- Okay per Florentina Addison

## 2020-08-19 ENCOUNTER — Ambulatory Visit: Attending: Registered Nurse | Primary: Registered Nurse

## 2020-08-19 ENCOUNTER — Ambulatory Visit: Admit: 2020-08-19 | Discharge: 2020-08-21 | Payer: MEDICARE | Attending: Registered Nurse | Primary: Registered Nurse

## 2020-08-19 DIAGNOSIS — U071 COVID-19: Secondary | ICD-10-CM

## 2020-08-19 MED ORDER — QUETIAPINE 50 MG TAB
50 mg | ORAL_TABLET | ORAL | 11 refills | Status: AC
Start: 2020-08-19 — End: ?

## 2020-08-19 NOTE — Progress Notes (Signed)
[facility] INTER[facility] MEDIC[facility]   [address]  [place] [address]  [phone]  [name]  Feb 20, 1941      Impression/Plan: Will see patient in 3 months for followup, COVID 19 pneumonia, dementia.    Chronic Conditions Addressed Today     1. Acute hypoxemic respiratory failure due to COVID-19 [facility]) - Primary      Continue to utilize oxygen therapy via nasal cannula 2-4 L minute to maintain oxygen saturation greater than 92 %  Will see patient for followup in 3 months                Chief Compliant: Alejandro [name] presents today for followup following hospitalization from  08/06/20 - 08/14/20, COVID 19 pneumonia, respiratory failure.        HPI:  79 year old pleasant gentleman who is challenged with dementia who was recently hospitalized at [facility] with COVID 19 pneumonia, respiratory failure. Patient is a resident of [facility] dementia unit; he is here with his medical POA [name] who is his niece and has visited our office with patient in the past.  Unfortunately, patient now requires oxygen therapy. Dementia likely complicates consistently using oxygen especially at night. Patient is weaker today than he was previous to contracting COVID. He remains pleasantly confused and is a limited historian.          Current Outpatient Medications   Medication Instructions   ??? acetaminophen (TYLENOL) 650 mg, Oral, EVERY 4 HOURS AS NEEDED   ??? amLODIPine (NORVASC) 10 mg, Oral, DAILY   ??? benazepriL (LOTENSIN) 10 mg, Oral, DAILY   ??? bisacodyL (DULCOLAX) 10 mg, Rectal, DAILY AS NEEDED   ??? cetirizine (ZYRTEC) 10 mg, Oral, DAILY   ??? donepeziL (ARICEPT) 5 mg, Oral, EVERY BEDTIME   ??? Eliquis 2.5 mg tablet TAKE 1 TABLET BY MOUTH TWICE DAILY.   ??? glucagon 1 mg, IntraVENous, ONCE   ??? glucose blood VI test strips (Prodigy No Coding) strip USE 1 STRIP TO TEST BLOOD GLUCOSE 4 TIMES DAILY.   ??? ibuprofen (MOTRIN) 400 mg, Oral, EVERY 6 HOURS AS NEEDED   ??? insulin aspart U-100 (NOVOLOG) 100 unit/mL (3 mL) inpn Inject 7  units breakfast, lunch, supper plus correction (3 units for every 30 points over 150 to max of 9 extra units)   ??? insulin glargine (Basaglar KwikPen U-100 Insulin) 100 unit/mL (3 mL) inpn Inject 38 units Am and 10 units PM subq E10.65   ??? Insulin Syringe-Needle U-100 0.3 mL 30 syrg 1 Each, SubCUTAneous, 4 TIMES DAILY   ??? lancets misc Use one lancet to test blood glucose 4 times daily   ??? levothyroxine (SYNTHROID) 50 mcg, Oral, DAILY BEFORE BREAKFAST   ??? magnesium citrate solution 296 mL, Oral, NOW, Prn if no results after enema. If no results in 1 hour of completion of bowl protocol, call MD immediately for further orders.    ??? magnesium hydroxide (Phillips Milk of Magnesia) 400 mg/5 mL suspension 30 mL, Oral, BEDTIME PRN, Prn if no BM in 3 days   ??? multivitamin (ONE A DAY) tablet 1 Tablet, Oral, DAILY   ??? OTHER Monitor Blood Glucose 4 times daily, before meals and at 8PM   ??? pantoprazole (PROTONIX) 40 mg, Oral, DAILY   ??? pen needle,diabetic dual safty (BD AutoShield Duo Pen Needle) 30 gauge x 3/16" ndle For use with insulin pens 5 times daily   ??? QUEtiapine (SEROquel) 50 mg tablet TAKE 1 TABLET BY MOUTH TWICE DAILY.   ??? rosuvastatin (  CRESTOR) 10 mg, Oral, EVERY BEDTIME   ??? tamsulosin (FLOMAX) 0.4 mg, Oral, DAILY        Allergies   Allergen Reactions   ??? Shellfish Derived Anaphylaxis        Review of Systems   Constitutional: Positive for malaise/fatigue. Negative for chills and fever.   Respiratory: Positive for shortness of breath.    Cardiovascular: Negative.    Gastrointestinal: Negative.    Genitourinary: Negative.    Musculoskeletal: Negative.    Skin: Negative.    Neurological:        Pleasantly confused - dementia at baseline   Psychiatric/Behavioral:        Dementia        Vitals:    08/19/20 1126   BP: 136/72   Pulse: 65   SpO2: 98%   Weight: 157 lb 9.6 oz (71.5 kg)   Height: 5\' 11"  (1.803 m)       Physical Exam  Constitutional:       General: He is not in acute distress.     Appearance: He is  ill-appearing. He is not toxic-appearing or diaphoretic.   HENT:      Head: Normocephalic and atraumatic.   Eyes:      General: No scleral icterus.        Right eye: No discharge.         Left eye: No discharge.      Conjunctiva/sclera: Conjunctivae normal.   Cardiovascular:      Rate and Rhythm: Regular rhythm.      Heart sounds: Normal heart sounds.   Pulmonary:      Effort: Pulmonary effort is normal.      Breath sounds: Normal breath sounds.   Musculoskeletal:         General: Normal range of motion.      Cervical back: Normal range of motion.   Skin:     General: Skin is warm and dry.      Coloration: Skin is pale.   Neurological:      Mental Status: He is alert. Mental status is at baseline.      Motor: Weakness present.      Comments: dementia   Psychiatric:      Comments: Pleasantly confused - dementia              45 minutes spent on this office visit reviewing medical records, documenting, interviewing assessing, planning, educating.  I reviewed and the patient's Past Medical, Surgical, Family and Social History in addition to their Problems, Meds and Allergies.  Recent results reviewed  Any problems listed in the Assessment and Plan were assessed during today's visit and if not explicitly discussed are stable based on history, physical exam, review of pertinent labs, studies and medications.         Follow-up and Dispositions    ?? Return for followup in 3 months for COVID 19 pneumonia, dementia..       Future Appointments   Date Time Provider Department Center   09/11/2020 10:00 AM 13/02/2020, FNP [facility] INT MED   09/25/2020  2:30 PM 09/27/2020, RN [facility] ENDO   10/14/2020  2:20 PM 14/05/2020, MD [facility] INT MED       Patient was seen by Purcell Mouton, FNP and  Ramon Dredge, FNP  08/19/2020      [facility] INTERNAL MEDICINE   [address] [place]  [phone]

## 2020-08-21 ENCOUNTER — Ambulatory Visit: Attending: Registered Nurse | Primary: Registered Nurse

## 2020-08-21 NOTE — Assessment & Plan Note (Signed)
Continue to utilize oxygen therapy via nasal cannula 2-4 L minute to maintain oxygen saturation greater than 92 %  Will see patient for followup in 3 months

## 2020-08-26 NOTE — Telephone Encounter (Signed)
[facility] resident, Alejandro [name] needs refill of Lantus and lancets at [facility].      Please advsie

## 2020-08-26 NOTE — Telephone Encounter (Signed)
Alejandro [name] contacted PCP office.  Advised that Pt blood sugar levels have been inconsistent, and there has been no changes in insulin dosage.     Please advise  Alejandro [name] [phone]

## 2020-08-26 NOTE — Telephone Encounter (Signed)
Patient called in and his blood sugars have been running in the 400 or 500. They are wondering if they need to give him more insulin or what they should do for him.    You can contact Sarah at [phone]

## 2020-08-27 ENCOUNTER — Encounter

## 2020-08-27 MED ORDER — BASAGLAR KWIKPEN U-100 INSULIN 100 UNIT/ML (3 ML) SUBCUTANEOUS
100 unit/mL (3 mL) | SUBCUTANEOUS | 11 refills | Status: DC
Start: 2020-08-27 — End: 2020-11-12

## 2020-08-27 MED ORDER — LANCETS
3 refills | Status: AC
Start: 2020-08-27 — End: ?

## 2020-08-27 NOTE — Telephone Encounter (Signed)
Today's BG levels 205, 239. Called and discussed with them.  Since COVID dementia has worsened.  He is forgetting whether not he is eaten and sometimes eating multiple meals in a row.  This is resulted in severe hyperglycemia.  They are unable to refuse food if patient requested.    RE:  Nitish Roes    DOB:  [id number]  1. Lantus insulin 38 units a.m. and 10 units p.m.  2. Discontinue previous Novolog insulin orders  3. NovoLog insulin 7 units each meal plus 3 units for every 30 points over 150 to a maximum of 9 units per meal.  4. Thanks for your assistance

## 2020-08-27 NOTE — Telephone Encounter (Signed)
Refill sent

## 2020-08-27 NOTE — Telephone Encounter (Signed)
Maralyn Sago is calling from [facility], would like call back to discuss patient.    She is seeing a lot of signs of depression.    [phone]

## 2020-08-27 NOTE — Telephone Encounter (Signed)
 Called [facility] to speak with Lauraine.    Thinks patient is having some depression, not brushing hair, sitting in dining room, not talking as much.     Low energy, just exisiting    Lauraine doesn't think its related to his dementia.    He just seems sad.    Told sarah to keep monitoring him and I will talk to PCP about any recommendations.    Signed By: Izetta CHRISTELLA Pesa     August 27, 2020

## 2020-08-28 NOTE — Telephone Encounter (Signed)
Noted. Patient has had a decline since having COVID- lack of endurance and dementia  can present similarly to depression- please give [place] update on patient next week. thanks

## 2020-09-11 ENCOUNTER — Telehealth: Attending: Registered Nurse | Primary: Registered Nurse

## 2020-09-11 ENCOUNTER — Telehealth: Admit: 2020-09-11 | Discharge: 2020-09-12 | Payer: MEDICARE | Attending: Registered Nurse | Primary: Registered Nurse

## 2020-09-11 DIAGNOSIS — G301 Alzheimer's disease with late onset: Secondary | ICD-10-CM

## 2020-09-11 MED ORDER — BIOFREEZE (MENTHOL) 4 % TOPICAL GEL
4 % | Freq: Every morning | CUTANEOUS | 6 refills | Status: AC
Start: 2020-09-11 — End: 2021-09-06

## 2020-09-11 MED ORDER — ACETAMINOPHEN 325 MG TABLET
325 mg | ORAL_TABLET | Freq: Three times a day (TID) | ORAL | 0 refills | Status: AC
Start: 2020-09-11 — End: 2020-09-18

## 2020-09-11 MED ORDER — OTHER
0 refills | Status: AC
Start: 2020-09-11 — End: ?

## 2020-09-11 NOTE — Assessment & Plan Note (Signed)
Will order scheduled Tylenol and scheduled Biofreeze gel for 1 week  Will reassess with follow-up virtual visit next week

## 2020-09-11 NOTE — Assessment & Plan Note (Signed)
Patient was recently debilitated with COVID  He is returned to his typical level of health  He may have lost some weight during his illness with COVID   Will obtain weekly weights-order sent to [facility] - faxed.

## 2020-09-11 NOTE — Assessment & Plan Note (Signed)
Continue with Aricept and Seroquel  Continue with staying on the memory care unit of assisted living facility as this is working out well  Patient's niece Alejandro [name] will let us know if there is anything patient needs or any change in health

## 2020-09-11 NOTE — Progress Notes (Signed)
[facility] INTERNAL MEDICINE   [address]  [place] [address]  [phone]      TELEMEDICINE VISIT (Performed via Telephone or Audio & Video)     METHOD OF VISIT: {VV Type: Video and Audio  PROVIDER LOCATION:  COV Telehealth Provider Location: Office  PATIENT PHYSICAL ADDRESS AT TIME OF TELEMEDICINE VISIT: Home: [facility] [place]  [address] [place] [address]  PARTICIPANTS: Patient and Family member: Hilton Sinclair (name)    BILLING CODE: Tele-Health (Audio / Video) Visit:  Billing for this visit is time based - 99213 - Est. - 20-29 minutes  and The total time spent was 30, which represents the total time spent on the day of the encounter.  TOTAL TIME SPENT ON ENCOUNTER:  30 minutes    ??? Recent results reviewed in the electronic health record.  o I specifically reviewed the following: Labs and Previous Office Encounters  ??? Problems, Meds and Allergies were reviewed.  ??? Patient gave verbal consent for this TeleHealth visit.    CHIEF COMPLAINT   Alejandro [name] is a 79 y.o. male who presents today via telehealth for follow-up, dementia.      HISTORY OF PRESENT ILLNESS       Dementia  Patient presents with his niece [name] from patient's apartment at [facility] assisted living-memory care unit.  Today patient doing okay.   He says his back bothers him sometimes when he gets up in the morning, but not every morning.  Patient tells me he feels fine when I asked if he feels down or depressed.  [name] was concerned that he may be feeling depressed.  Patient wishes to not have a medication for depression at this time as he feels he is doing okay.  [name] will let me know if patient decides to try antidepressant medication in the future.      Lower back pain  Patient is receptive to having scheduled Tylenol for the next week as well as scheduled Biofreeze gel.  He is currently working with Enriqueta Shutter physical therapist and feels this is going well.  When he is not seen by  home health physical therapist he works with [facility] physical therapy who come to his assisted living home.    MEDICATIONS     Current Outpatient Medications   Medication Sig   ??? acetaminophen (TYLENOL) 325 mg tablet Take 2 Tablets by mouth every eight (8) hours for 7 days. Indications: backache   ??? menthol (Biofreeze, menthol,) 4 % gel 2-6 g by Apply Externally route Every morning for 360 days. Indications: backache   ??? OTHER Please obtain weekly weights and fax to [facility] [place] [facility] fax # [phone] attention Raylene Miyamoto MA/FNP Ramon Dredge. Thanks  Indications: caloric undernutrition   ??? lancets misc Use one lancet to test blood glucose 4 times daily   ??? insulin glargine (Basaglar KwikPen U-100 Insulin) 100 unit/mL (3 mL) inpn Inject 38 units Am and 10 units PM subq E10.65  Indications: type 1 diabetes mellitus   ??? QUEtiapine (SEROquel) 50 mg tablet TAKE 1 TABLET BY MOUTH TWICE DAILY.   ??? bisacodyL (DULCOLAX) 10 mg supp Insert 10 mg into rectum daily as needed for Constipation.   ??? ibuprofen (MOTRIN) 400 mg tablet Take 400 mg by mouth every six (6) hours as needed for Pain.   ??? glucose blood VI test strips (Prodigy No Coding) strip USE 1 STRIP TO TEST BLOOD GLUCOSE 4 TIMES DAILY.   ???  Eliquis 2.5 mg tablet TAKE 1 TABLET BY MOUTH TWICE DAILY.   ??? rosuvastatin (CRESTOR) 10 mg tablet Take 1 Tablet by mouth nightly.   ??? insulin aspart U-100 (NOVOLOG) 100 unit/mL (3 mL) inpn Inject 7 units breakfast, lunch, supper plus correction (3 units for every 30 points over 150 to max of 9 extra units)   ??? pen needle,diabetic dual safty (BD AutoShield Duo Pen Needle) 30 gauge x 3/16" ndle For use with insulin pens 5 times daily   ??? levothyroxine (SYNTHROID) 50 mcg tablet Take 1 Tablet by mouth Daily (before breakfast).   ??? pantoprazole (PROTONIX) 40 mg tablet Take 1 Tablet by mouth daily.   ??? tamsulosin (Flomax) 0.4 mg capsule Take 1 Capsule by mouth daily.   ??? multivitamin (ONE A DAY) tablet Take 1 Tablet by mouth  daily.   ??? OTHER Monitor Blood Glucose 4 times daily, before meals and at 8PM   ??? Insulin Syringe-Needle U-100 0.3 mL 30 syrg 1 Each by SubCUTAneous route four (4) times daily.   ??? amLODIPine (NORVASC) 10 mg tablet Take 1 Tab by mouth daily. Indications: high blood pressure   ??? acetaminophen (TYLENOL) 325 mg tablet Take 650 mg by mouth every four (4) hours as needed for Pain or Fever (tamp > 100).   ??? magnesium citrate solution Take 296 mL by mouth now. Prn if no results after enema. If no results in 1 hour of completion of bowl protocol, call MD immediately for further orders.    ??? magnesium hydroxide (Phillips Milk of Magnesia) 400 mg/5 mL suspension Take 30 mL by mouth nightly as needed for Constipation (If no BM for 3 days). Prn if no BM in 3 days   ??? benazepriL (LOTENSIN) 5 mg tablet Take 10 mg by mouth daily.   ??? cetirizine (ZYRTEC) 10 mg tablet Take 10 mg by mouth daily.   ??? donepeziL (ARICEPT) 5 mg tablet Take 5 mg by mouth nightly.   ??? glucagon (GlucaGen HypoKit) 1 mg injection 1 mg by IntraVENous route once.     No current facility-administered medications for this visit.     There are no discontinued medications.    ALLERGIES     Allergies   Allergen Reactions   ??? Shellfish Derived Anaphylaxis       REVIEW OF SYSTEMS   See HPI    ACTIVE MEDICAL PROBLEMS     Patient Active Problem List   Diagnosis Code   ??? 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 Hypertension I10   ??? 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 Other constipation K59.09   ??? 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 Unspecified protein-calorie malnutrition (HCC) E46   ??? 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 Other abnormalities of gait and mobility R26.89   ??? Generalized muscle weakness  M62.81   ??? Cognitive communication deficit R41.841   ??? Lesion of right external ear H61.91   ??? Type 1 diabetes mellitus with hyperglycemia, with long-term current use of insulin (HCC) E10.65   ??? DKA, type 1 (HCC) E10.10   ??? Rhinorrhea J34.89   ??? Shortness of breath R06.02   ??? Multiple falls R29.6   ??? COVID-19 U07.1   ??? DKA (diabetic ketoacidoses) E11.10   ??? Dementia (HCC)  F03.90   ??? Acute hypoxemic respiratory failure due to COVID-19 (HCC) U07.1, J96.01   ??? Chronic low back pain without sciatica M54.50, G89.29       SOCIAL HISTORY     Social History     Social History Narrative    Widowed without children. Wife died about 02/21/13    Previous employment as a Runner, broadcasting/film/video and principal    4 years of college       VITALS   There were no vitals filed for this visit.  There is no height or weight on file to calculate BMI.    BP Readings from Last 3 Encounters:   08/19/20 136/72   08/14/20 (!) 151/63   07/30/20 (!) 147/64     Wt Readings from Last 3 Encounters:   08/19/20 157 lb 9.6 oz (71.5 kg)   08/14/20 150 lb 9.2 oz (68.3 kg)   07/30/20 161 lb (73 kg)       PHYSICAL EXAM     VV Exam: Audio only encounter without any pertinent exam findings. and Video and audio encounter without any pertinent exam findings.    ORDERS & MEDS (NEW)     Orders Placed This Encounter   ??? acetaminophen (TYLENOL) 325 mg tablet     Sig: Take 2 Tablets by mouth every eight (8) hours for 7 days. Indications: backache     Dispense:  42 Tablet     Refill:  0     Total use of scheduled and as needed tylenol not to exceed 3000 mg in 24 hours.   ??? menthol (Biofreeze, menthol,) 4 % gel     Sig: 2-6 g by Apply Externally route Every morning for 360 days. Indications: backache     Dispense:  1 Each     Refill:  6     Either Biofreeze 4% topical gel or Biofreeze 5% topical gel may be filled for this prescription per pharmacist choice.   ??? OTHER     Sig: Please obtain weekly weights and fax to [facility] [place] [facility] fax # [phone] attention Raylene Miyamoto MA/FNP Ramon Dredge. Thanks  Indications: caloric undernutrition     Dispense:  1 Each     Refill:  0       ASSESSMENT AND PLAN     Diagnoses and all orders for this visit:    1. Late onset Alzheimer's dementia with behavioral disturbance [facility])  Assessment & Plan:  Continue with Aricept and Seroquel  Continue with staying on the memory care unit of assisted living facility as this is working out well  Patient's niece [name] will let us know if there is anything patient needs or any change in health      2. Chronic low back pain without sciatica, unspecified back pain laterality  Assessment & Plan:  Will order scheduled Tylenol and scheduled Biofreeze gel for 1 week  Will reassess with follow-up virtual visit next week    Orders:  -     acetaminophen (TYLENOL) 325 mg tablet; Take 2 Tablets by mouth every eight (8) hours for 7 days. Indications: backache  -     menthol (Biofreeze, menthol,) 4 % gel; 2-6 g by Apply Externally route Every morning for 360 days. Indications: backache    3. Mild protein-calorie malnutrition (HCC)  Assessment & Plan:  Patient was recently debilitated with COVID  He is returned to his typical level of health  He may have lost some weight during his illness  with COVID   Will obtain weekly weights-order sent to [facility] - faxed.    Orders:  -     OTHER; Please obtain weekly weights and fax to [facility] fax # [phone] attention Raylene MiyamotoKatie Simpson MA/FNP Ramon DredgePaul Mizraim Harmening. Thanks  Indications: caloric undernutrition    Follow-up and Dispositions    ?? Return for Follow-up in 1 week, lower back pain, virtual visit.         Future Appointments   Date Time Provider Department Center   09/19/2020  2:00 PM Ramon DredgeCramm, Murtaza Shell, [place] [facility] INT MED   09/25/2020  2:30 PM Mahala Menghiniowns, Lori A, RN [facility]   10/14/2020  2:20 PM Ruby ColaProkop, David, MD [facility]       Ramon DredgePaul Pluma Diniz, FNP  09/11/2020

## 2020-09-12 NOTE — Telephone Encounter (Signed)
 Kacie this is the patient I was speaking of this morning. I am wondering what antidepressant medication would be a good choice in light of the fact he is currently taking Seroquel  and Aricept  for dementia. Thanks       [place],     Here is what I found to be helpful summaries of information. I have linked the guideline for more information for you to review. Https://focus.[email or web address]://hamilton-[email or web address].15106      Overall, the evidence for the efficacy of antidepressant pharmacotherapy for people with depression and dementia is weak, mostly because trials were underpowered and confounded by variability in presenting symptoms, trial methods, and presence of comorbid conditions and differences among treatments and doses used. However, as noted in the 2007 guideline, clinical consensus still supports undertaking one or more trials of an antidepressant to treat clinically significant and persistent depressed mood in patients with dementia because of the increased rates of disability, impaired quality of life, and greater mortality associated with depression.     There is mixed evidence but in general SSRIs are preferred over TCAs. And there is some evidence for citalopram:   The Citalopram for Agitation in Alzheimer Disease (CitAD) study was a randomized,  placebo-controlled, double-blind, multisite trial of citalopram for agitation in patients with probable Alzheimer's disease. Participants received a psychosocial intervention plus either citalopram or placebo for 9 weeks. Citalopram was initiated at a dosage of 10 mg/day with planned titration to 30 mg/day over 3 weeks on the basis of response and tolerability. Participants who received citalopram showed significant improvement compared with those who received placebo on the Neurobehavioral Rating Scale Agitation Subscale and on the modified Alzheimer's Disease Cooperative Study-  Clinical Global Impression of Change. Caregiver distress was also  reduced. As noted by the study authors, cognitive and cardiac adverse effects of citalopram may limit its use at the dosage of 30 mg/day.    Sertraline is usually used in geriatric patients rather than citalopram due to the adverse effects mentioned above but you will see in the guideline that two different studies tried sertraline at target doses of 100 mg and 150 mg and neither were effective and only increased adverse effects.      All antidepressants interact with antipsychotics to increase the risk of neuroleptic malignant syndrome as well as serotonin syndrome but of them SSRIS are preferred:  Serotonergic Agents (High Risk) may enhance the adverse/toxic effect of Antipsychotic Agents. Specifically, serotonergic agents may enhance dopamine blockade, possibly increasing the risk for neuroleptic malignant syndrome. Antipsychotic Agents may enhance the serotonergic effect of Serotonergic Agents (High Risk). This could result in serotonin syndrome.        With regards to Dementia and the use of Antipsychotics:  Antipsychotics should be used with extreme caution and avoided whenever possible because they increase the risk of All-Cause Mortality (BoardLicense.de)    ALERT: US  Boxed Warning  ?Increased mortality in elderly patients with dementia-related psychosis:  Elderly patients with dementia-related psychosis treated with antipsychotic drugs are at an increased risk of death. Quetiapine  is not approved for the treatment of patients with dementia-related psychosis.       Here is a list of medications to avoid in patients with dementia/AD:   Antiemetics (eg. Promethazine)   Antihistamines (eg 1st generation diphenhydramine and doxylamine are worse than 2nd generation loratadine  or cetirizine  which he is taking)   Antipsychotics of any type (non are approved, behavioral management is preferred first unless it doesn't work and patient is harming self or  others)  Barbiturates (eg. Phenobarbital, butalbital   Central anticholinergics (eg benztropine)   Peripheral anticholinergics (including incontinence and IBS drugs)   Skeletal muscle relaxants (eg baclofen)   Other CNS depressants (eg. Opioids, sedative hypnotics)      Therefore based on what I have found I would recommend trying citalopram 10 mg once daily and titrating up to a max dose of 30 mg /day as needed. I would recommend discontinuing the seroquel  if you can. If patient has allergic rhinitis related allergies I would advise topical intranasal flonase versus cetirizine . I would recommend avoiding or limiting the use of Ibuprofen because it increases the risk of bleeding with Eliquis , and the SSRI which has antiplatelet effects.     Let me know if you have any other questions,   Kacie

## 2020-09-12 NOTE — Telephone Encounter (Signed)
-----   Message from Ramon Dredge, FNP sent at 09/11/2020 11:10 AM EDT -----  Alejandro [name] this is the patient I was speaking of this morning.  I am wondering what antidepressant medication would be a good choice in light of the fact he is currently taking Seroquel and Aricept for dementia.  Thanks

## 2020-09-19 ENCOUNTER — Telehealth: Attending: Registered Nurse | Primary: Registered Nurse

## 2020-09-19 ENCOUNTER — Telehealth: Admit: 2020-09-19 | Discharge: 2020-09-23 | Payer: MEDICARE | Attending: Registered Nurse | Primary: Registered Nurse

## 2020-09-19 DIAGNOSIS — M545 Low back pain, unspecified: Secondary | ICD-10-CM

## 2020-09-19 NOTE — Progress Notes (Signed)
[facility] INTERNAL MEDICINE  [address]  [place] [address]  [phone]    TELEMEDICINE VISIT (Performed via Telephone or Audio & Video)   Method of visit: audio only per patient preference.  PROVIDER LOCATION: provider location: office  PATIENT PHYSICAL ADDRESS AT TIME OF TELEMEDICINE VISIT: Home: PARTICIPANTS:  Patient and Family member: Patient's neice Alejandro [name] his medical POA   [facility] [employer]  [address]  [place] [address]  BILLING CODE COVVIRTUALTIMEBILLING [phone]  TOTAL TIME SPENT ON ENCOUNTER:  28 minutes    ??? Recent results reviewed in the electronic health record.  o I specifically reviewed the following: Labs and Previous Office Encounters  ??? Problems, Meds and Allergies were reviewed.  ??? Patient gave verbal consent for this TeleHealth visit.      CHIEF COMPLAINT   Alejandro [name] is a 79 y.o. male who presents today via telehealth for follow-up of lower back pain.    HISTORY OF PRESENT ILLNESS     80 year old gentleman who is challenged with dementia; communicating with patient via telephone; patient's niece Alejandro [name] is present for today's telephone visit for followup, lower back pain.    Back pain  Patient reports he has some lower back pain at times; he feels this is because he is not exercising.  Patient is moving from memory care unit to assisted living at [facility] assisted living.  Patient has been working with physical therapy through home health.  Patient is looking forward to being able to walk more when he moves to assisted living.  The memory care unit is a locked unit and patient has limited area for walking-just a short hallway.  Assisted living will have multiple long hallways for patient to walk.  We discussed utilizing Tylenol as a scheduled medication for lower back pain.  Patient prefers to not take any pain medications at this time- his niece Alejandro [name] will contact me if patient changes his mind and would like to have pain medication scheduled.  Patient  feels when he is able to walk more his back will feel better.      MEDICATIONS     Current Outpatient Medications   Medication Sig   ??? menthol (Biofreeze, menthol,) 4 % gel 2-6 g by Apply Externally route Every morning for 360 days. Indications: backache   ??? OTHER Please obtain weekly weights and fax to [facility] [place] [facility] fax # [phone] attention Raylene Miyamoto MA/FNP Ramon Dredge. Thanks  Indications: caloric undernutrition   ??? lancets misc Use one lancet to test blood glucose 4 times daily   ??? insulin glargine (Basaglar KwikPen U-100 Insulin) 100 unit/mL (3 mL) inpn Inject 38 units Am and 10 units PM subq E10.65  Indications: type 1 diabetes mellitus   ??? QUEtiapine (SEROquel) 50 mg tablet TAKE 1 TABLET BY MOUTH TWICE DAILY.   ??? bisacodyL (DULCOLAX) 10 mg supp Insert 10 mg into rectum daily as needed for Constipation.   ??? ibuprofen (MOTRIN) 400 mg tablet Take 400 mg by mouth every six (6) hours as needed for Pain.   ??? glucose blood VI test strips (Prodigy No Coding) strip USE 1 STRIP TO TEST BLOOD GLUCOSE 4 TIMES DAILY.   ??? Eliquis 2.5 mg tablet TAKE 1 TABLET BY MOUTH TWICE DAILY.   ??? pen needle,diabetic dual safty (BD AutoShield Duo Pen Needle) 30 gauge x 3/16" ndle For use with insulin pens 5 times daily   ??? multivitamin (ONE A DAY) tablet Take 1 Tablet by  mouth daily.   ??? OTHER Monitor Blood Glucose 4 times daily, before meals and at 8PM   ??? Insulin Syringe-Needle U-100 0.3 mL 30 syrg 1 Each by SubCUTAneous route four (4) times daily.   ??? amLODIPine (NORVASC) 10 mg tablet Take 1 Tab by mouth daily. Indications: high blood pressure   ??? acetaminophen (TYLENOL) 325 mg tablet Take 650 mg by mouth every four (4) hours as needed for Pain or Fever (tamp > 100).   ??? magnesium citrate solution Take 296 mL by mouth now. Prn if no results after enema. If no results in 1 hour of completion of bowl protocol, call MD immediately for further orders.    ??? magnesium hydroxide (Phillips Milk of Magnesia) 400 mg/5 mL  suspension Take 30 mL by mouth nightly as needed for Constipation (If no BM for 3 days). Prn if no BM in 3 days   ??? benazepriL (LOTENSIN) 5 mg tablet Take 10 mg by mouth daily.   ??? cetirizine (ZYRTEC) 10 mg tablet Take 10 mg by mouth daily.   ??? donepeziL (ARICEPT) 5 mg tablet Take 5 mg by mouth nightly.   ??? glucagon (GlucaGen HypoKit) 1 mg injection 1 mg by IntraVENous route once.   ??? rosuvastatin (CRESTOR) 10 mg tablet Take 1 Tablet by mouth nightly.   ??? insulin aspart U-100 (NOVOLOG) 100 unit/mL (3 mL) inpn Inject 7 units breakfast, lunch, supper plus correction (3 units for every 30 points over 150 to max of 9 extra units)   ??? levothyroxine (SYNTHROID) 50 mcg tablet Take 1 Tablet by mouth Daily (before breakfast).   ??? tamsulosin (FLOMAX) 0.4 mg capsule TAKE 1 CAPSULE BY MOUTH ONCE DAILY.   ??? pantoprazole (PROTONIX) 40 mg tablet TAKE 1 TABLET BY MOUTH ONCE A DAY     No current facility-administered medications for this visit.     There are no discontinued medications.    ALLERGIES     Allergies   Allergen Reactions   ??? Shellfish Derived Anaphylaxis       REVIEW OF SYSTEMS   See HPI    ACTIVE MEDICAL PROBLEMS     Patient Active Problem List   Diagnosis Code   ??? 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 Hypertension I10   ??? 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 Other constipation K59.09   ??? 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 Unspecified protein-calorie malnutrition (HCC) E46   ??? 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 Other abnormalities of gait and mobility R26.89   ??? Generalized muscle weakness M62.81   ??? Cognitive communication deficit R41.841   ??? Lesion of right external ear H61.91   ??? Type 1 diabetes mellitus with hyperglycemia, with long-term  current use of insulin (HCC) E10.65   ??? DKA, type 1 (HCC) E10.10   ??? Rhinorrhea J34.89   ??? Shortness of breath R06.02   ??? Multiple falls R29.6   ??? COVID-19 U07.1   ??? DKA (diabetic ketoacidoses) E11.10   ??? Dementia (HCC) F03.90   ??? Acute hypoxemic respiratory failure due to COVID-19 (HCC) U07.1, J96.01   ??? Chronic low back pain without sciatica M54.50, G89.29       SOCIAL HISTORY     Social History     Social History  Narrative    Widowed without children. Wife died about 02-18-2013    Previous employment as a Runner, broadcasting/film/video and principal    4 years of college       VITALS   There were no vitals filed for this visit.  There is no height or weight on file to calculate BMI.    BP Readings from Last 3 Encounters:   09/25/20 130/64   08/19/20 136/72   08/14/20 (!) 151/63     Wt Readings from Last 3 Encounters:   09/25/20 157 lb (71.2 kg)   08/19/20 157 lb 9.6 oz (71.5 kg)   08/14/20 150 lb 9.2 oz (68.3 kg)       PHYSICAL EXAM   Audio only visit    ORDERS & MEDS (NEW)   No orders of the defined types were placed in this encounter.      ASSESSMENT AND PLAN     Diagnoses and all orders for this visit:    1. Chronic low back pain without sciatica, unspecified back pain laterality  Assessment & Plan:  Will hold off on scheduling any pain medications per patient request    will monitor with followup visit.          Future Appointments   Date Time Provider Department Center   10/14/2020  2:20 PM Ruby Cola, MD [facility]   02/26/2021  2:30 PM Mahala Menghini, RN [facility]       Ramon Dredge, FNP  09/19/2020

## 2020-09-19 NOTE — Telephone Encounter (Signed)
Please call patient's niece [name] who is his medical power of attorney regarding a followup telephone visit in 5 weeks- Thanks

## 2020-09-22 MED ORDER — PANTOPRAZOLE 40 MG TAB, DELAYED RELEASE
40 mg | ORAL_TABLET | ORAL | 10 refills | Status: AC
Start: 2020-09-22 — End: ?

## 2020-09-22 MED ORDER — TAMSULOSIN SR 0.4 MG 24 HR CAP
0.4 mg | ORAL_CAPSULE | ORAL | 10 refills | Status: AC
Start: 2020-09-22 — End: ?

## 2020-09-23 NOTE — Telephone Encounter (Signed)
Larita Fife from [employer] called back and said pt was given 38 units of lantis at 7:30am and his sugar still hasn't dropped and increased to 560.     Please advise  [phone]

## 2020-09-23 NOTE — Telephone Encounter (Signed)
Larita Fife is calling back to follow up on this. Please advise [phone]

## 2020-09-23 NOTE — Telephone Encounter (Signed)
Service sent a message this morning that Alejandro [name] called with a blood sugar of 544.  No other info given

## 2020-09-23 NOTE — Telephone Encounter (Signed)
I would question if he actually received his Lantus earlier today with this result.  Please return call to caller and find out what patient's pre lunch blood glucose level is and follow sliding scale as ordered. Thanks

## 2020-09-24 MED ORDER — INSULIN ASPART 100 UNIT/ML (3 ML) SUB-Q PEN
100 unit/mL (3 mL) | SUBCUTANEOUS | 11 refills | Status: DC
Start: 2020-09-24 — End: 2020-11-12

## 2020-09-24 MED ORDER — LEVOTHYROXINE 50 MCG TAB
50 mcg | ORAL_TABLET | Freq: Every day | ORAL | 0 refills | Status: DC
Start: 2020-09-24 — End: 2020-12-19

## 2020-09-24 NOTE — Telephone Encounter (Signed)
Called [name] and discussed recent blood sugars. Alejandro [name], the current medication assistant believes the high readings are due to an administration of insulin error.     I have faxed over the correct order and hopefully that will improve the readings.    This mornings fasting reading was: 189    Signed By: Candi Leash     September 24, 2020

## 2020-09-24 NOTE — Telephone Encounter (Signed)
 Requested Prescriptions     Signed Prescriptions Disp Refills   . levothyroxine  (SYNTHROID ) 50 mcg tablet 90 Tablet 0     Sig: Take 1 Tablet by mouth Daily (before breakfast).     Authorizing Provider: JACQUALIN DEWARD PARAS     Ordering User: ANTONETTA IZETTA HERO     Call back needed: no  Medication(s): see above  Quantity: 90                                         Pharmacy:  [facility]  Prescriber:   cramm  Last appt @ PCP Office: 08/19/2020  Future Appointments   Date Time Provider Department Center   09/25/2020  2:30 PM Bethel Katheryn LABOR, RN [facility]   10/14/2020  2:20 PM Rinda Lenis, MD [facility]       MOST RECENT BLOOD PRESSURES  BP Readings from Last 3 Encounters:   08/19/20 136/72   08/14/20 (!) 151/63   07/30/20 (!) 147/64        MOST RECENT LAB DATA  Creatinine   Date Value Ref Range Status   08/10/2020 0.79 0.40 - 1.20 MG/DL Final     Potassium   Date Value Ref Range Status   08/10/2020 4.1 3.5 - 5.0 mmol/L Final     ALT (SGPT)   Date Value Ref Range Status   08/10/2020 24 3 - 35 U/L Final     TSH   Date Value Ref Range Status   07/22/2020 3.17 0.40 - 3.80 mIU/L Final     Comment:     2017 American Thyroid Association pregnancy guidelines:    In the first trimester, the lower reference range of TSH can be reduced by  approximately 0.4 mU/L while the upper reference range is reduced by  approximately 0.5 mU/L.  For the typical patient in early pregnancy, this  corresponds to a TSH upper reference limit of 4.0 mU/L.  This reference limit  should generally be applied beginning with the late first trimester, weeks 7-12,  with a gradual return towards the non-pregnant range in the second and third  trimesters.    Please see the complete ATA guidelines for further information. According to the  manufacturer, there is no biotin interference in serum concentrations up to 1200  ng/mL.       Cholesterol, total   Date Value Ref Range Status   08/08/2020 107 (L) 115 - 200 MG/DL Final     HGB   Date Value Ref Range Status    08/06/2020 11.9 (L) 14.0 - 18.0 g/dL Final     HCT   Date Value Ref Range Status   08/06/2020 34.1 (L) 42.0 - 52.0 % Final     Vitamin B12   Date Value Ref Range Status   07/22/2020 735 200 - 900 pg/mL Final     Comment:     A B12 level less than 200 pg/mL should be regarded as evidence of B12  deficiency.  It has been reported that between 5 and 10% of patients with values  between 200 and 400 pg/ml may experience neuropsychiatric and hematological  abnormalities due to occult B12 deficiency.  For patients with B12 levels  between 200 and 400 pg/mL, methylmalonic acid (MMA) is recommended.  If MMA  levels are elevated, then B12 replacement should be considered.       Hemoglobin  A1c   Date Value Ref Range Status   07/22/2020 8.6 (H) 4.8 - 5.6 % Final     Comment:     A1c > 6.5% can be used to diagnose diabetes mellitus according to ADA Clinical  Practice Recommendations.  Diagnosis should be confirmed by repeating the A1c  test, except in those individuals who are symptomatic and also have an increased  plasma glucose >200 mg/dL.    A1c 5.7-6.4% are considered at an increased risk for developing diabetes  mellitus.    Conditions that shorten red cell survival, such as hemolytic anemia, may yield  falsely low results.  Iron deficiency anemia may yield falsely high results.    ------------------------------------------------------------------------------------------------    A1c <7% has been shown to reduce microvascular complications of diabetes and is  considered a reasonable A1c goal for many nonpregnant diabetic adults.    A1c <6.5% may be suggested for selected individual diabetic patients, if this  can be achieved without significant hypoglycemia or other adverse effects of  treatment.  Appropriate patients might include those with short duration of  diabetes, long life expectancy, and no significant CVD.    A1c <8% may be appropriate for diabetic patients with a history of severe  hypoglycemia, limited life  expectancy, advanced microvascular or macrovascular  complications, and extensive comorbid conditions.       Hemoglobin A1c (POC)   Date Value Ref Range Status   06/10/2020 9.1 % Final

## 2020-09-24 NOTE — Telephone Encounter (Signed)
 Alejandro [name]  10/30/1941      Call back needed: no    Preferred call back number: Call preference: Cell phone   [phone] (home)    Telephone Information:   Mobile [phone]        Medications Requested:  Requested Prescriptions     Pending Prescriptions Disp Refills   . levothyroxine  (SYNTHROID ) 50 mcg tablet 90 Tablet 0     Sig: Take 1 Tablet by mouth Daily (before breakfast).   Pt is out of medication.           Preferred Pharmacy:   [facility] - [place], [place] - [address]  [address]  [place] [address]  Phone: [phone] Fax: [phone]                                       Last visit with provider:  09/19/2020    Future Appointments   Date Time Provider Department Center   09/25/2020  2:30 PM Bethel Katheryn LABOR, RN [facility]   10/14/2020  2:20 PM Rinda Lenis, MD [facility]       MOST RECENT BLOOD PRESSURES  BP Readings from Last 3 Encounters:   08/19/20 136/72   08/14/20 (!) 151/63   07/30/20 (!) 147/64        MOST RECENT LAB DATA  Lab Results   Component Value Date/Time    Creatinine 0.79 08/10/2020 11:33 AM    Potassium 4.1 08/10/2020 11:33 AM    ALT (SGPT) 24 08/10/2020 11:33 AM    TSH 3.17 07/22/2020 08:21 AM    Cholesterol, total 107 (L) 08/08/2020 11:00 AM    HGB 11.9 (L) 08/06/2020 08:10 AM    HCT 34.1 (L) 08/06/2020 08:10 AM    Vitamin B12 735 07/22/2020 08:21 AM    Hemoglobin A1c 8.6 (H) 07/22/2020 08:21 AM    Hemoglobin A1c (POC) 9.1 06/10/2020 02:00 PM

## 2020-09-25 ENCOUNTER — Institutional Professional Consult (permissible substitution): Admit: 2020-09-25 | Discharge: 2020-09-25 | Payer: MEDICARE | Primary: Registered Nurse

## 2020-09-25 DIAGNOSIS — E1065 Type 1 diabetes mellitus with hyperglycemia: Secondary | ICD-10-CM

## 2020-09-25 NOTE — Progress Notes (Signed)
 [facility]  [address] [place] [address]  [phone]    Diabetes Visit Note    Alejandro [name]  is a 79 y.o. male with type 1  diabetes, uncontrolled.     Presents for follow up diabetes visit with registered nurse/certified diabetes educator.     DM measures  A1C 8.6% 07/22/20    The purpose of today's visit is for follow-up to support with blood sugar management/education and may include teaching of various AADE education topics as applicable;   Review of current blood sugars and goals; review of recent labs related to diabetes; review of current behaviors related to diabetes management;   Assessment/education related to hypoglycemia and hyperglycemia; prevention of complications; meal planning; psychosocial needs; goal setting for behavioral change related to diabetes/chronic disease management; discussion and recommendations for insulin  and or other medication initiation/titration; plan for follow up.    Patient continues to receive ongoing diabetes education at subsequent clinic visits.     Current Status/Summary:   Lives Alejandro [name]  He is here with his niece, Alejandro [name]    Back pain     And some leg pain  Working with PT at [employer]  He wants to gain strength in his legs              Basaglar  36 units AM; 10 units   Novolog 7 units with meals  He uses 3 units for each 30 points over 150 to a maximum of 9 extra units    He states staff gives injections; he cannot recite his doses or his blood sugars     SMBG:  No sugars with him today , no MAR  I called facility and they will fax over 2 weeks BGs and MAR    From his description, he may need a slight increase to mealtiome insulin .       I spent the visit educating/reinforcing education for A1C, using mealtime insulin  with meal dose and corrector dose.   Zell feels his insulin  is given before meals and he is able to eat within 15 minutes.     Patient is testing blood glucose  times per day for last 60 days or uses sensor  Patient is  taking 3 or more injections of insulin  daily or on insulin  pump  Patient requires frequent insulin  adjustments based on blood sugar readings (meter or CGM)    The patient has had a doctor visit to evaluate the diabetes within the last 6 months; receives education pertaining to self management at each clinic visit.   The patient is given appropriate education/review of DKA prevention/recognition; sick day protocol; pump back up supply kit as appropriate.  The patient is given information on contact information/emergency contact information                Meal Planning:  3 meals at [employer]  Varied carb amounts.               Exercise: some ambulation  Today in wheelchair         Hypoglycemia :   He states none recently  Carries/understands treatment for lows  Dawn phenomenon    Summary/Plan of Care/recommendations for today's visit/  Revised Med/Insulin  Regimen:     LABS-    Lab Results   Component Value Date/Time    Hemoglobin A1c 8.6 (H) 07/22/2020 08:21 AM    Hemoglobin A1c (POC) 9.1 06/10/2020 02:00 PM     Lab Results   Component Value  Date/Time    TSH 3.17 07/22/2020 08:21 AM     No results found for: MCACR, MCA1, MCA2, MCA3, MCAU, MCAU2, MCALPOCT  Lab Results   Component Value Date/Time    Sodium 137 08/10/2020 11:33 AM    Potassium 4.1 08/10/2020 11:33 AM    Chloride 106 08/10/2020 11:33 AM    CO2 22 08/10/2020 11:33 AM    Anion gap 13 08/10/2020 11:33 AM    Glucose 124 (H) 08/10/2020 11:33 AM    BUN 20 08/10/2020 11:33 AM    Creatinine 0.79 08/10/2020 11:33 AM    BUN/Creatinine ratio 25 08/10/2020 11:33 AM    GFR est AA >60 08/10/2020 11:33 AM    GFR est non-AA >60 08/10/2020 11:33 AM    Calcium  8.2 (L) 08/10/2020 11:33 AM    Bilirubin, total 0.70 08/10/2020 11:33 AM    Alk. phosphatase 56 08/10/2020 11:33 AM    Protein, total 6.2 08/10/2020 11:33 AM    Albumin 2.6 (L) 08/10/2020 11:33 AM    Globulin 3.6 08/10/2020 11:33 AM    A-G Ratio 0.7 08/10/2020 11:33 AM    ALT (SGPT) 24 08/10/2020 11:33  AM        There were no vitals taken for this visit.    Labs Reviewed with patient as appropriate.     History:  Patient Active Problem List   Diagnosis Code   . 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 Hypertension I10   . 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 Other constipation K59.09   . 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 Unspecified protein-calorie malnutrition (HCC) E46   . 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 Other abnormalities of gait and mobility R26.89   . Generalized muscle weakness M62.81   . Cognitive communication deficit R41.841   . Lesion of right external ear H61.91   . Type 1 diabetes mellitus with hyperglycemia, with long-term current use of insulin  (HCC) E10.65   . DKA, type 1 (HCC) E10.10   . Rhinorrhea J34.89   . Shortness of breath R06.02   . Multiple falls R29.6   . COVID-19 U07.1   . DKA (diabetic ketoacidoses) E11.10   . Dementia (HCC) F03.90   . Acute hypoxemic respiratory failure due to COVID-19 (HCC) U07.1, J96.01   . Chronic low back pain without sciatica M54.50, G89.29     Allergies   Allergen Reactions   . Shellfish Derived Anaphylaxis     Social History     Socioeconomic History   . Marital status: SINGLE   Tobacco Use   . Smoking status: Never Smoker   . Smokeless tobacco: Never Used   Substance and Sexual Activity   . Alcohol use: Never   . Drug use: Never   Social History Narrative    Widowed without children. Wife died about 2013/09/19    Previous employment as a Runner, broadcasting/film/video and principal    4 years of college     History reviewed. No pertinent family history.        ( Recommend annual dilated eye exam; annual urine microalbumin/creat; A1C every 3-6 months; Annual comprehensive foot exam by PCP and regular self care; semi  annual dental visits)  Preventive Care/Standards Reviewed with patient   Pre-Conception/pregnancy planning education provided if  appropriate      Diabetes Care Recommendations:  Monitor blood sugars as directed   You are invited to contact or send blood sugars to us  as needed for review  Exercise daily  Monitor portions carefully/plate method of portion control/carb counting  Cut back on starchy carbs  Stay well hydrated by drinking at least 8 glasses of water per day  Check feet daily and wear shoes at all times  Check blood sugar before driving - must be at least 120  -Cholesterol assessment               -goals of LDL <, HDL >, triglycerides<                 -BP goal SBP <140 and DBP < 90              -continue BP medications as instructed by provider  -ACE/ARB treatment for kidney protection              -Continue as instructed by provider  -Yearly urine for microalbuminuria recommended              -Aspirin  81 mg if you have no stomach problems or advised by provider.  -Discussed/ Gave Hypoglycemia treatment plan  -Yearly dilated eye exams  -Annual flu shot/ pneumovax   Hyperglycemia protocol reviewed  Hypoglycemia protocol reviewed as applicable  Exercise/insulin  protocol reviewed as applicable  Emergency contact: [phone], ask to have Sheryl paged.    Good Website: [email or web address]     Written education materials provided as appropriate (AADE approved)  Teachback  method of return demonstration (knowledge/skills ).  Materials for education include written, verbal, visual, hands on. Take-home handouts where applicable.  The patient participated actively and asked appropriate questions; Is able to use appropriate teachback to demonstrate knowledge and skills acquired.   Patient states understand of his/her current diabetes meds/insulin  and proper use.    No orders of the defined types were placed in this encounter.    Katheryn DELENA Dural, RN  09/25/2020  >50% of visit spent counseling concerning diabetes  continuation of care/education topics as appropriate    Electronically signed by Katheryn DELENA Dural, RN @[facility] @    Voice recognition software used for transcription of this note. I apologize for typographical errors or phrases/words that  don't make sense.

## 2020-10-11 MED ORDER — ROSUVASTATIN 10 MG TAB
10 mg | ORAL_TABLET | Freq: Every evening | ORAL | 0 refills | Status: DC
Start: 2020-10-11 — End: 2020-12-16

## 2020-10-11 NOTE — Telephone Encounter (Signed)
Pt has not had their overdue labs/imaging done at the expected date you selected. The reminder for these tests have been postponed until one month before their next appt. Please review your open orders. If you want them done sooner, please advise. If not, no further action required.     Please advise

## 2020-10-11 NOTE — Telephone Encounter (Signed)
On call-[name] called to get a refill of crestor sent to [facility].  Rx sent

## 2020-10-12 NOTE — Assessment & Plan Note (Signed)
Will hold off on scheduling any pain medications per patient request    will monitor with followup visit.

## 2020-10-13 NOTE — Telephone Encounter (Signed)
Noted. Thanks

## 2020-10-14 ENCOUNTER — Ambulatory Visit: Payer: MEDICARE | Attending: Internal Medicine | Primary: Registered Nurse

## 2020-10-15 NOTE — Telephone Encounter (Signed)
This patient no showed you on 12/7 for a office visit.     Would you like a no show letter sent?

## 2020-10-20 NOTE — Telephone Encounter (Signed)
[facility] faxed over the patients weekly weight: 163.2lb    Signed By: Candi Leash     October 20, 2020

## 2020-10-27 NOTE — Telephone Encounter (Signed)
Has this been resolved?

## 2020-10-27 NOTE — Telephone Encounter (Signed)
Been resolved

## 2020-11-12 MED ORDER — INSULIN ASPART 100 UNIT/ML (3 ML) SUB-Q PEN
100 unit/mL (3 mL) | SUBCUTANEOUS | 11 refills | Status: DC
Start: 2020-11-12 — End: 2021-02-16

## 2020-11-12 MED ORDER — INSULIN GLARGINE 100 UNIT/ML (3 ML) SUB-Q PEN
100 unit/mL (3 mL) | SUBCUTANEOUS | 11 refills | Status: AC
Start: 2020-11-12 — End: ?

## 2020-11-17 NOTE — Telephone Encounter (Signed)
 Alejandro [name] at [facility] left message with answering service on 11/15/2020 11:33am stating received a fax to discontinue quik pen and is concerned about why.    [phone]

## 2020-11-17 NOTE — Telephone Encounter (Signed)
Called and discussed with her.

## 2020-12-16 MED ORDER — ROSUVASTATIN 10 MG TAB
10 mg | ORAL_TABLET | Freq: Every evening | ORAL | 0 refills | Status: DC
Start: 2020-12-16 — End: 2021-03-17

## 2020-12-16 NOTE — Telephone Encounter (Signed)
Alejandro [name]  1941-09-27    Pt is out and wondering if we can send in today.         Call back needed: no    Preferred call back number: Call preference: Cell phone   [phone] (home)    Telephone Information:   Mobile [phone]        Medications Requested:  Requested Prescriptions     Pending Prescriptions Disp Refills   . rosuvastatin (CRESTOR) 10 mg tablet 90 Tablet 0     Sig: Take 1 Tablet by mouth nightly.             Preferred Pharmacy:   [facility] - [place], [place] - [address]  [address]  [place] [address]  Phone: [phone] Fax: [phone]                                       Last visit with provider:  09/19/2020    Future Appointments   Date Time Provider Department Center   02/26/2021  2:30 PM Mahala Menghini, RN [facility]       MOST RECENT BLOOD PRESSURES  BP Readings from Last 3 Encounters:   09/25/20 130/64   08/19/20 136/72   08/14/20 (!) 151/63        MOST RECENT LAB DATA  Lab Results   Component Value Date/Time    Creatinine 0.79 08/10/2020 11:33 AM    Potassium 4.1 08/10/2020 11:33 AM    ALT (SGPT) 24 08/10/2020 11:33 AM    TSH 3.17 07/22/2020 08:21 AM    Cholesterol, total 107 (L) 08/08/2020 11:00 AM    HGB 11.9 (L) 08/06/2020 08:10 AM    HCT 34.1 (L) 08/06/2020 08:10 AM    Vitamin B12 735 07/22/2020 08:21 AM    Hemoglobin A1c 8.6 (H) 07/22/2020 08:21 AM    Hemoglobin A1c (POC) 9.1 06/10/2020 02:00 PM

## 2020-12-16 NOTE — Telephone Encounter (Signed)
Future Appointments   Date Time Provider Department Center   02/26/2021  2:30 PM Mahala Menghini, RN [facility]       MOST RECENT BLOOD PRESSURES  BP Readings from Last 3 Encounters:   09/25/20 130/64   08/19/20 136/72   08/14/20 (!) 151/63        MOST RECENT LAB DATA  Creatinine   Date Value Ref Range Status   08/10/2020 0.79 0.40 - 1.20 MG/DL Final     Potassium   Date Value Ref Range Status   08/10/2020 4.1 3.5 - 5.0 mmol/L Final     ALT (SGPT)   Date Value Ref Range Status   08/10/2020 24 3 - 35 U/L Final     TSH   Date Value Ref Range Status   07/22/2020 3.17 0.40 - 3.80 mIU/L Final     Comment:     2017 American Thyroid Association pregnancy guidelines:    In the first trimester, the lower reference range of TSH can be reduced by  approximately 0.4 mU/L while the upper reference range is reduced by  approximately 0.5 mU/L.  For the typical patient in early pregnancy, this  corresponds to a TSH upper reference limit of 4.0 mU/L.  This reference limit  should generally be applied beginning with the late first trimester, weeks 7-12,  with a gradual return towards the non-pregnant range in the second and third  trimesters.    Please see the complete ATA guidelines for further information. According to the  manufacturer, there is no biotin interference in serum concentrations up to 1200  ng/mL.       Cholesterol, total   Date Value Ref Range Status   08/08/2020 107 (L) 115 - 200 MG/DL Final     HGB   Date Value Ref Range Status   08/06/2020 11.9 (L) 14.0 - 18.0 g/dL Final     HCT   Date Value Ref Range Status   08/06/2020 34.1 (L) 42.0 - 52.0 % Final     Vitamin B12   Date Value Ref Range Status   07/22/2020 735 200 - 900 pg/mL Final     Comment:     A B12 level less than 200 pg/mL should be regarded as evidence of B12  deficiency.  It has been reported that between 5 and 10% of patients with values  between 200 and 400 pg/ml may experience neuropsychiatric and hematological  abnormalities due to occult B12  deficiency.  For patients with B12 levels  between 200 and 400 pg/mL, methylmalonic acid (MMA) is recommended.  If MMA  levels are elevated, then B12 replacement should be considered.       Hemoglobin A1c   Date Value Ref Range Status   07/22/2020 8.6 (H) 4.8 - 5.6 % Final     Comment:     A1c > 6.5% can be used to diagnose diabetes mellitus according to ADA Clinical  Practice Recommendations.  Diagnosis should be confirmed by repeating the A1c  test, except in those individuals who are symptomatic and also have an increased  plasma glucose >200 mg/dL.    A1c 5.7-6.4% are considered at an increased risk for developing diabetes  mellitus.    Conditions that shorten red cell survival, such as hemolytic anemia, may yield  falsely low results.  Iron deficiency anemia may yield falsely high results.    ------------------------------------------------------------------------------------------------    A1c <7% has been shown to reduce microvascular complications of diabetes and is  considered  a reasonable A1c goal for many nonpregnant diabetic adults.    A1c <6.5% may be suggested for selected individual diabetic patients, if this  can be achieved without significant hypoglycemia or other adverse effects of  treatment.  Appropriate patients might include those with short duration of  diabetes, long life expectancy, and no significant CVD.    A1c <8% may be appropriate for diabetic patients with a history of severe  hypoglycemia, limited life expectancy, advanced microvascular or macrovascular  complications, and extensive comorbid conditions.       Hemoglobin A1c (POC)   Date Value Ref Range Status   06/10/2020 9.1 % Final

## 2020-12-19 MED ORDER — LEVOTHYROXINE 50 MCG TAB
50 mcg | ORAL_TABLET | Freq: Every day | ORAL | 0 refills | Status: DC
Start: 2020-12-19 — End: 2020-12-22

## 2020-12-19 NOTE — Telephone Encounter (Signed)
 Alejandro [name]  Apr 25, 1941      Call back needed: no    Preferred call back number: Call preference: Home phone   [phone] (home)    Telephone Information:   Mobile [phone]        Medications Requested:  Requested Prescriptions     Pending Prescriptions Disp Refills   . levothyroxine  (SYNTHROID ) 50 mcg tablet 90 Tablet 0     Sig: Take 1 Tablet by mouth Daily (before breakfast).             Preferred Pharmacy:   [facility] - [place], [place] - [address]  [address]  [place] [address]  Phone: [phone] Fax: [phone]                                       Last visit with provider:  09/19/2020    Future Appointments   Date Time Provider Department Center   02/26/2021  2:30 PM Bethel Katheryn LABOR, RN [facility]       MOST RECENT BLOOD PRESSURES  BP Readings from Last 3 Encounters:   09/25/20 130/64   08/19/20 136/72   08/14/20 (!) 151/63        MOST RECENT LAB DATA  Lab Results   Component Value Date/Time    Creatinine 0.79 08/10/2020 11:33 AM    Potassium 4.1 08/10/2020 11:33 AM    ALT (SGPT) 24 08/10/2020 11:33 AM    TSH 3.17 07/22/2020 08:21 AM    Cholesterol, total 107 (L) 08/08/2020 11:00 AM    HGB 11.9 (L) 08/06/2020 08:10 AM    HCT 34.1 (L) 08/06/2020 08:10 AM    Vitamin B12 735 07/22/2020 08:21 AM    Hemoglobin A1c 8.6 (H) 07/22/2020 08:21 AM    Hemoglobin A1c (POC) 9.1 06/10/2020 02:00 PM

## 2021-01-06 MED ORDER — UNISTIK 3 COMFORT DEVICE KIT
PACK | 11 refills | Status: AC
Start: 2021-01-06 — End: ?

## 2021-01-29 ENCOUNTER — Inpatient Hospital Stay
Admit: 2021-01-29 | Discharge: 2021-01-30 | Disposition: A | Discharge status: Other Acute Inpatient Hospital | Payer: MEDICARE | Attending: Family Medicine

## 2021-01-29 DIAGNOSIS — G4762 Sleep related leg cramps: Secondary | ICD-10-CM

## 2021-01-29 LAB — CBC WITH AUTOMATED DIFF
ABS. BASOPHILS: 0.1 10*3/uL (ref 0.0–0.2)
ABS. EOSINOPHILS: 0.6 10*3/uL — ABNORMAL HIGH (ref 0.0–0.5)
ABS. IMM. GRANS.: 0 10*3/uL (ref 0.0–0.1)
ABS. LYMPHOCYTES: 2 10*3/uL (ref 1.0–4.5)
ABS. MONOCYTES: 0.6 10*3/uL (ref 0.1–0.8)
ABS. NEUTROPHILS: 3.2 10*3/uL (ref 1.9–7.8)
ABSOLUTE NRBC: 0 10*3/uL
BASOPHILS: 1 %
EOSINOPHILS: 9 %
HCT: 38.5 % — ABNORMAL LOW (ref 42.0–52.0)
HGB: 12.4 g/dL — ABNORMAL LOW (ref 14.0–18.0)
IMMATURE GRANULOCYTES: 0 %
LYMPHOCYTES: 31 %
MCH: 29.7 PG (ref 28.0–34.0)
MCHC: 32.2 g/dL (ref 32.0–36.0)
MCV: 92.3 FL (ref 80.0–100.0)
MONOCYTES: 10 %
MPV: 10 FL (ref 7.0–12.0)
NEUTROPHILS: 49 %
NRBC: 0 PER 100 WBC
PLATELET: 170 10*3/uL (ref 150–400)
RBC: 4.17 M/uL — ABNORMAL LOW (ref 4.50–6.00)
RDW: 12.9 % (ref 11.5–13.5)
WBC: 6.5 10*3/uL (ref 4.8–10.8)

## 2021-01-29 LAB — METABOLIC PANEL, COMPREHENSIVE
A-G Ratio: 1
ALT (SGPT): 16 U/L (ref 3–35)
AST (SGOT): 24 U/L (ref 15–40)
Albumin: 3.8 g/dL (ref 3.5–5.0)
Alk. phosphatase: 74 U/L (ref 35–100)
Anion gap: 13 mmol/L
BUN/Creatinine ratio: 15
BUN: 15 MG/DL (ref 7–20)
Bilirubin, total: 0.5 mg/dL (ref 0.10–1.20)
CO2: 26 mmol/L (ref 20–32)
Calcium: 8.9 MG/DL (ref 8.8–10.5)
Chloride: 99 mmol/L — ABNORMAL LOW (ref 100–110)
Creatinine: 0.98 MG/DL (ref 0.40–1.20)
GFR est AA: >60 mL/min/{1.73_m2} (ref >60)
GFR est non-AA: >60 mL/min/{1.73_m2} (ref >60)
Globulin: 3.8 g/dL
Glucose: 143 mg/dL — ABNORMAL HIGH (ref 75–110)
Potassium: 4.1 mmol/L (ref 3.5–5.0)
Protein, total: 7.6 g/dL (ref 6.2–8.0)
Sodium: 134 mmol/L — ABNORMAL LOW (ref 135–145)

## 2021-01-29 LAB — UA WITH REFLEX MICRO AND CULTURE
Bilirubin: NEGATIVE
Blood: NEGATIVE
Glucose: NEGATIVE mg/dL
Ketone: NEGATIVE mg/dL
Leukocyte Esterase: NEGATIVE
Nitrites: NEGATIVE
Protein: NEGATIVE mg/dL
Specific gravity: 1.01 (ref 1.005–1.030)
Urobilinogen: 0.2 EU/dL (ref 0.1–1.0)
pH (UA): 6.5 (ref 5.0–9.0)

## 2021-01-29 LAB — MAGNESIUM
Magnesium: 2 mg/dL (ref 1.7–2.5)
Magnesium: 2 mg/dL (ref 1.7–2.5)

## 2021-01-29 LAB — CBC WITH AUTO DIFFERENTIAL
Basophils %: 1 %
Basophils Absolute: 0.1 10*3/uL (ref 0.0–0.2)
Eosinophils %: 9 %
Eosinophils Absolute: 0.6 10*3/uL — ABNORMAL HIGH (ref 0.0–0.5)
Granulocyte Absolute Count: 0 10*3/uL (ref 0.0–0.1)
Hematocrit: 38.5 % — ABNORMAL LOW (ref 42.0–52.0)
Hemoglobin: 12.4 g/dL — ABNORMAL LOW (ref 14.0–18.0)
Immature Granulocytes: 0 %
Lymphocytes %: 31 %
Lymphocytes Absolute: 2 10*3/uL (ref 1.0–4.5)
MCH: 29.7 PG (ref 28.0–34.0)
MCHC: 32.2 g/dL (ref 32.0–36.0)
MCV: 92.3 FL (ref 80.0–100.0)
MPV: 10 FL (ref 7.0–12.0)
Monocytes %: 10 %
Monocytes Absolute: 0.6 10*3/uL (ref 0.1–0.8)
NRBC Absolute: 0 10*3/uL
Neutrophils %: 49 %
Neutrophils Absolute: 3.2 10*3/uL (ref 1.9–7.8)
Nucleated RBCs: 0 PER 100 WBC
Platelets: 170 10*3/uL (ref 150–400)
RBC: 4.17 M/uL — ABNORMAL LOW (ref 4.50–6.00)
RDW: 12.9 % (ref 11.5–13.5)
WBC: 6.5 10*3/uL (ref 4.8–10.8)

## 2021-01-29 LAB — URINALYSIS WITH REFLEX TO CULTURE
Bilirubin, Urine: NEGATIVE
Blood, Urine: NEGATIVE
Glucose, Ur: NEGATIVE mg/dL
Ketones, Urine: NEGATIVE mg/dL
Leukocyte Esterase, Urine: NEGATIVE
Nitrite, Urine: NEGATIVE
Protein, UA: NEGATIVE mg/dL
Specific Gravity, UA: 1.01 NA (ref 1.005–1.030)
Urobilinogen, UA, POCT: 0.2 EU/dL (ref 0.1–1.0)
pH, UA: 6.5 NA (ref 5.0–9.0)

## 2021-01-29 LAB — COMPREHENSIVE METABOLIC PANEL
ALT: 16 U/L (ref 3–35)
AST: 24 U/L (ref 15–40)
Albumin/Globulin Ratio: 1
Albumin: 3.8 g/dL (ref 3.5–5.0)
Alkaline Phosphatase: 74 U/L (ref 35–100)
Anion Gap: 13 mmol/L
BUN: 15 MG/DL (ref 7–20)
Bun/Cre Ratio: 15 NA
CO2: 26 mmol/L (ref 20–32)
Calcium: 8.9 MG/DL (ref 8.8–10.5)
Chloride: 99 mmol/L — ABNORMAL LOW (ref 100–110)
Creatinine: 0.98 MG/DL (ref 0.40–1.20)
EGFR IF NonAfrican American: >60 mL/min/{1.73_m2} (ref >60)
GFR African American: >60 mL/min/{1.73_m2} (ref >60)
Globulin: 3.8 g/dL
Glucose: 143 mg/dL — ABNORMAL HIGH (ref 75–110)
Potassium: 4.1 mmol/L (ref 3.5–5.0)
Sodium: 134 mmol/L — ABNORMAL LOW (ref 135–145)
Total Bilirubin: 0.5 mg/dL (ref 0.10–1.20)
Total Protein: 7.6 g/dL (ref 6.2–8.0)

## 2021-01-29 NOTE — Telephone Encounter (Signed)
Out of med tech's scope of practice to recommend this- please call again and instruct staff to have patient taken to ED for strokelike symptom new onset leg weakness. Thanks

## 2021-01-29 NOTE — ED Provider Notes (Signed)
HPI patient presents with left leg cramping overnight.  He states he was up all night with cramps in his left leg.  This morning he got up the cramping was less intense and it lasted about another hour and he was finally able to get rid of it and then he went for a walk.  Apparently somebody at the assisted living thought his leg was weak so they sent him over for evaluation.  He denies ever having a week leg.  It was cramped and was painful and once the pain went away he was able to walk without difficulty he feels fine now.  He denies any headache changes in vision he denies any nausea vomiting or diarrhea he denies any weakness in the arms or legs difficulty finding words or voice.  He does have dementia but he is awake and alert now    Past Medical History:   Diagnosis Date   ??? Dementia (HCC)    ??? Diabetes (HCC)    ??? Grief     loss of wife 20-Mar-2013   ??? Hypertension    ??? Presence of cardiac pacemaker        No past surgical history on file.      No family history on file.    Social History     Socioeconomic History   ??? Marital status: SINGLE     Spouse name: Not on file   ??? Number of children: Not on file   ??? Years of education: Not on file   ??? Highest education level: Not on file   Occupational History   ??? Not on file   Tobacco Use   ??? Smoking status: Never Smoker   ??? Smokeless tobacco: Never Used   Substance and Sexual Activity   ??? Alcohol use: Never   ??? Drug use: Never   ??? Sexual activity: Not on file   Other Topics Concern   ??? Not on file   Social History Narrative    Widowed without children. Wife died about 2013/03/20    Previous employment as a Runner, broadcasting/film/video and principal    4 years of college     Social Determinants of Health     Financial Resource Strain:    ??? Difficulty of Paying Living Expenses: Not on file   Food Insecurity:    ??? Worried About Programme researcher, broadcasting/film/video in the Last Year: Not on file   ??? Ran Out of Food in the Last Year: Not on file   Transportation Needs:    ??? Freight forwarder (Medical): Not on file    ??? Lack of Transportation (Non-Medical): Not on file   Physical Activity:    ??? Days of Exercise per Week: Not on file   ??? Minutes of Exercise per Session: Not on file   Stress:    ??? Feeling of Stress : Not on file   Social Connections:    ??? Frequency of Communication with Friends and Family: Not on file   ??? Frequency of Social Gatherings with Friends and Family: Not on file   ??? Attends Religious Services: Not on file   ??? Active Member of Clubs or Organizations: Not on file   ??? Attends Banker Meetings: Not on file   ??? Marital Status: Not on file   Intimate Partner Violence:    ??? Fear of Current or Ex-Partner: Not on file   ??? Emotionally Abused: Not on file   ??? Physically Abused: Not on  file   ??? Sexually Abused: Not on file   Housing Stability:    ??? Unable to Pay for Housing in the Last Year: Not on file   ??? Number of Places Lived in the Last Year: Not on file   ??? Unstable Housing in the Last Year: Not on file         ALLERGIES: Shellfish derived    Review of Systems   Constitutional: Negative for chills and fever.        See HPI   HENT: Negative.    Eyes: Negative for visual disturbance.   Respiratory: Negative.    Cardiovascular: Negative.    Gastrointestinal: Negative.    Musculoskeletal: Positive for myalgias.   Allergic/Immunologic: Negative for immunocompromised state.   Neurological: Negative.    Hematological: Negative.    All other systems reviewed and are negative.      Vitals:    01/29/21 1708   BP: (!) 172/63   Pulse: 61   Resp: 20   Temp: 98.2 ??F (36.8 ??C)            Physical Exam  Vitals and nursing note reviewed.   Constitutional:       General: He is not in acute distress.     Appearance: Normal appearance. He is well-developed and normal weight. He is not ill-appearing, toxic-appearing or diaphoretic.   HENT:      Head: Normocephalic and atraumatic.      Nose: Nose normal.      Mouth/Throat:      Mouth: Mucous membranes are moist.      Pharynx: Oropharynx is clear.   Eyes:       Extraocular Movements: Extraocular movements intact.      Conjunctiva/sclera: Conjunctivae normal.      Pupils: Pupils are equal, round, and reactive to light.   Neck:      Vascular: No carotid bruit.   Cardiovascular:      Rate and Rhythm: Normal rate and regular rhythm.      Pulses: Normal pulses.      Heart sounds: Normal heart sounds. No murmur heard.      Pulmonary:      Effort: Pulmonary effort is normal. No respiratory distress.      Breath sounds: Normal breath sounds. No stridor. No wheezing, rhonchi or rales.   Chest:      Chest wall: No tenderness.   Abdominal:      General: Abdomen is flat. Bowel sounds are normal. There is no distension.      Palpations: Abdomen is soft. There is no mass.      Tenderness: There is no abdominal tenderness. There is no right CVA tenderness, left CVA tenderness, guarding or rebound.      Hernia: No hernia is present.   Musculoskeletal:         General: No swelling, tenderness, deformity or signs of injury. Normal range of motion.      Cervical back: Normal range of motion and neck supple. No rigidity or tenderness.      Right lower leg: No edema.      Left lower leg: No edema.   Lymphadenopathy:      Cervical: No cervical adenopathy.   Skin:     General: Skin is warm and dry.      Capillary Refill: Capillary refill takes less than 2 seconds.      Findings: No erythema, lesion or rash.   Neurological:      General: No  focal deficit present.      Mental Status: He is alert and oriented to person, place, and time. Mental status is at baseline.      Cranial Nerves: No cranial nerve deficit.      Sensory: No sensory deficit.      Motor: No weakness.      Coordination: Coordination normal.      Gait: Gait normal.      Deep Tendon Reflexes: Reflexes normal.      Comments: Patient's neurologic exam is completely normal he is awake alert orientated x3 he is able to follow all of my commands his stroke scale is 0 when I asked him to repeat a phrase to me I specifically asked him to  repeat this buds for you and he stated this buds for me because I am thirsty.  He is completely appropriate   Psychiatric:         Mood and Affect: Mood normal.         Behavior: Behavior normal.         Thought Content: Thought content normal.         Judgment: Judgment normal.          MDM  Number of Diagnoses or Management Options  Diagnosis management comments: 80 year old male who is anticoagulated presents with left leg cramping heat he denies ever having any weakness he said it kept him up all night with cramping of the leg he said he we had a hard time moving it initially this morning because of the cramps and wants to cramps went away he was able to walk freely without difficulty may he currently feels fine echo walk freely.  Because of his cramps we are going to get electrolyte profile CBC CMP in the Mag I see no signs of stroke he has no symptoms consistent with stroke we are not ordering a head CT or evaluation he is anticoagulated on on Eliquis so I find that to be incredibly unlikely as it would likely have been hemorrhagic stroke and he would have more significant symptoms    ED Course as of 01/29/21 2000   Thu Jan 29, 2021   1959 Patient's urinalysis is normal we are going to discharge him back he feels fine he is neurologically intact he has no complaints at all [JB]      ED Course User Index  [JB] Konrad Felix, DO       Procedures    Labs Reviewed   CBC WITH AUTOMATED DIFF - Abnormal; Notable for the following components:       Result Value    RBC 4.17 (*)     HGB 12.4 (*)     HCT 38.5 (*)     ABS. EOSINOPHILS 0.6 (*)     All other components within normal limits   METABOLIC PANEL, COMPREHENSIVE - Abnormal; Notable for the following components:    Sodium 134 (*)     Chloride 99 (*)     Glucose 143 (*)     All other components within normal limits   MAGNESIUM   UA WITH REFLEX MICRO AND CULTURE       Imaging Results:  No results found.    Medications given in the ED:  Medications - No data to  display    DIAGNOSIS:  1. Leg cramps, sleep related        Current Discharge Medication List          Discharged home stable  improved condition    FOLLOW-UP:  No follow-up provider specified.    Please note that portions of this document were created using the M*Modal Fluency Direct dictation system.  Any inconsistencies or typographical errors may be the result of mis-transcription and should be addressed with the document creator.

## 2021-01-29 NOTE — ED Notes (Signed)
Nurse reviewed discharge information with the Pt and the discharge paperwork was sent with G&[facility] to [facility] and report was called.

## 2021-01-29 NOTE — Telephone Encounter (Signed)
The symptoms could be related to a stroke- please call and advise staff to have patient taken to ED by ambulance. Tx

## 2021-01-29 NOTE — Telephone Encounter (Signed)
Spoke with staff at [facility]. They were surprised that pt needs to go to ER but will call an ambulance and have him taken to ER at [facility]. Renae Fickle can you call the ER

## 2021-01-29 NOTE — Telephone Encounter (Signed)
Sarah from [facility] called and stated that pt is having numbness and weakness in his left leg. 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 Maralyn Sago stated that it is getting worse. Pt wanted to be scheduled to be seen. Maralyn Sago would like PCP advisement before scheduling.     Please advise  [phone]     Ask for Maralyn Sago in Memory care

## 2021-01-29 NOTE — ED Notes (Signed)
Pt was assisted to restroom, gait normal needed only supportive assistance. Pt was given sandwich tray and sugar free drink. Call bell in reach will continue to monitor

## 2021-01-29 NOTE — Telephone Encounter (Signed)
Spoke with [facility]. [name] had left for the day. The med tech said she went in to check on him and he said the leg weakness is new but it is not bothering him. The med tech felt it was ok to wait for pt to come into the office to be seen. She said pt is resting right now. Renae Fickle can we put him on your schedule tomorrow at 3pm?

## 2021-01-30 NOTE — Telephone Encounter (Signed)
Noted. New onset weakness in an extremity - requires ED visit -time is crucial for a suspected CVA.

## 2021-02-12 NOTE — Telephone Encounter (Signed)
These are COVID related symptoms- he should be tested. Please let them know. Thanks

## 2021-02-12 NOTE — Telephone Encounter (Signed)
Patient is getting test now-- [name] will call with the results.    Signed By: Candi Leash     February 12, 2021

## 2021-02-12 NOTE — Telephone Encounter (Signed)
[name] from [facility] called to let us know that pt for the last 24hrs has had diarrhea and has been vomiting. The nurse at the facility would like to know if PCP could prescribe Zofran, an antinausea medication.    [phone]

## 2021-02-13 NOTE — Telephone Encounter (Signed)
Called again this morning [name] is not there today and Morning caregiver is not sure of the test results she stated she would look and call us back.    Signed By: Candi Leash     February 13, 2021

## 2021-02-15 ENCOUNTER — Telehealth

## 2021-02-15 NOTE — Telephone Encounter (Signed)
Called nursing, pt has been having diarrhea and vomiting, since last week. They tested for covid and this was negative. She is calling as no once called back Friday. No fever.  Poor appetite. Seems to be urininating but incontininent.- no stool softners, has not been on antibiotics recently. Staff do not feel he needs to be emergently today, at baseline for mental status    Please fax these [facility] in the am  [phone]

## 2021-02-16 MED ORDER — INSULIN LISPRO 100 UNIT/ML (3 ML) SUB-Q PEN
100 unit/mL | SUBCUTANEOUS | 0 refills | Status: DC
Start: 2021-02-16 — End: 2021-04-09

## 2021-02-16 MED ORDER — ONDANSETRON 4 MG TAB, RAPID DISSOLVE
4 mg | ORAL_TABLET | Freq: Three times a day (TID) | ORAL | 0 refills | Status: AC | PRN
Start: 2021-02-16 — End: ?

## 2021-02-16 NOTE — Telephone Encounter (Signed)
Sarah, caregiver, called stating she called pharmacy to get a refill of Novolog but it is not covered by insurance. Pharmacy suggested pt try Humalog. Maralyn Sago wants to know what pcp advises. Please keep Maralyn Sago updated [phone] ask for the med tech in memory care.

## 2021-02-16 NOTE — Telephone Encounter (Signed)
Humalog has been sent in, instructions and dosing are the same between the two rapid acting insulins

## 2021-02-16 NOTE — Telephone Encounter (Signed)
Labs have been faxed.

## 2021-02-16 NOTE — Telephone Encounter (Signed)
[name] aware.    Signed By: Candi Leash     February 16, 2021

## 2021-02-16 NOTE — Telephone Encounter (Signed)
Can we order patient Zofran for the time being while the labs are pending now that we know he is negative for covid?

## 2021-02-16 NOTE — Telephone Encounter (Signed)
Spoke to Caregiver at [facility], she stated patient is not doing well. He was vomited 3 times and has had persistent diarrhea for 5 days. His dementia/confusion has been worse the past few days. Patient will report discomfort to caregiver but at the doctors will say he has none.     Concerned about dehydration and increased weakness. PCP is out this week and patient would like to be seen     Made appointment for tomorrow at acute care.

## 2021-02-16 NOTE — Telephone Encounter (Signed)
rx sent

## 2021-02-16 NOTE — Telephone Encounter (Signed)
[name] called over the weekend with a negative result. Covering provider ordered labs and stool tests. I have faxed these to [facility].      Signed By: Candi Leash     February 16, 2021

## 2021-02-17 ENCOUNTER — Encounter

## 2021-02-17 ENCOUNTER — Inpatient Hospital Stay
Admit: 2021-02-17 | Discharge: 2021-02-17 | Disposition: A | Discharge status: Home / Self Care | Payer: MEDICARE | Attending: Physician Assistant

## 2021-02-17 ENCOUNTER — Ambulatory Visit: Payer: MEDICARE | Primary: Registered Nurse

## 2021-02-17 DIAGNOSIS — R197 Diarrhea, unspecified: Secondary | ICD-10-CM

## 2021-02-17 LAB — CBC WITH AUTOMATED DIFF
ABS. BASOPHILS: 0 10*3/uL (ref 0.0–0.2)
ABS. EOSINOPHILS: 0.5 10*3/uL (ref 0.0–0.5)
ABS. IMM. GRANS.: 0 10*3/uL (ref 0.0–0.1)
ABS. LYMPHOCYTES: 2 10*3/uL (ref 1.0–4.5)
ABS. MONOCYTES: 0.9 10*3/uL — ABNORMAL HIGH (ref 0.1–0.8)
ABS. NEUTROPHILS: 5.7 10*3/uL (ref 1.9–7.8)
BASOPHILS: 0 %
EOSINOPHILS: 6 %
HCT: 36.3 % — ABNORMAL LOW (ref 42.0–52.0)
HCT: 38.4 % — ABNORMAL LOW (ref 42.0–52.0)
HGB: 11.9 g/dL — ABNORMAL LOW (ref 14.0–18.0)
HGB: 12.6 g/dL — ABNORMAL LOW (ref 14.0–18.0)
IMMATURE GRANULOCYTES: 0 %
LYMPHOCYTES: 22 %
MCH: 30.1 pg (ref 28.0–34.0)
MCH: 29.9 PG (ref 28.0–34.0)
MCHC: 32.8 g/dL (ref 32.0–36.0)
MCHC: 32.8 g/dL (ref 32.0–36.0)
MCV: 91.9 fL (ref 80.0–100.0)
MCV: 91 FL (ref 80.0–100.0)
MEAN PLATELET VOLUME: 10.7 fL (ref 8.5–12.0)
MONOCYTES: 10 %
MPV: 10 FL (ref 7.0–12.0)
NEUTROPHILS: 62 %
NRBC: 0 PER 100 WBC (ref 0.0–1.0)
PLATELET: 166 10*3/uL (ref 150–400)
PLATELET: 151 10*3/uL (ref 150–400)
RBC: 3.95 Mil/uL — ABNORMAL LOW (ref 4.50–6.00)
RBC: 4.22 M/uL — ABNORMAL LOW (ref 4.50–6.00)
RDW-CV: 13.4 % (ref 11.5–13.6)
RDW-SD: 44.9 fL (ref 35.0–47.0)
RDW: 13.2 % (ref 11.5–13.5)
WBC: 8.2 10*3/uL (ref 4.7–10.8)
WBC: 9.2 10*3/uL (ref 4.8–10.8)

## 2021-02-17 LAB — UA WITH REFLEX MICRO AND CULTURE
Bilirubin: NEGATIVE
Blood: NEGATIVE
Glucose: NEGATIVE mg/dL
Leukocyte Esterase: NEGATIVE
Nitrites: NEGATIVE
Protein: NEGATIVE mg/dL
Specific gravity: 1.025 (ref 1.005–1.030)
Urobilinogen: 0.2 EU/dL (ref 0.1–1.0)
pH (UA): 6 (ref 5.0–9.0)

## 2021-02-17 LAB — METABOLIC PANEL, COMPREHENSIVE
A-G Ratio: 1
ALT (SGPT): 9 IU/L (ref 0–41)
ALT (SGPT): 15 U/L (ref 3–35)
AST (SGOT): 16 IU/L (ref 0–40)
AST (SGOT): 21 U/L (ref 15–40)
Albumin: 3.8 g/dL (ref 3.5–5.2)
Albumin: 3.8 g/dL (ref 3.5–5.0)
Alk. phosphatase: 68 IU/L (ref 40–129)
Alk. phosphatase: 56 U/L (ref 35–100)
Anion gap: 13 mEq/L (ref 3–16)
Anion gap: 17 mmol/L
BUN/Creatinine ratio: 19
BUN: 15 mg/dL (ref 8–23)
BUN: 17 MG/DL (ref 7–20)
Bilirubin, total: 0.3 mg/dL (ref 0.0–1.0)
Bilirubin, total: 0.7 mg/dL (ref 0.10–1.20)
CO2: 22 mEq/L (ref 22–32)
CO2: 23 mmol/L (ref 20–32)
Calcium: 9 mg/dL (ref 8.8–10.3)
Calcium: 9.2 MG/DL (ref 8.8–10.5)
Chloride: 106 mEq/L (ref 98–107)
Chloride: 104 mmol/L (ref 100–110)
Creatinine: 0.92 mg/dL (ref 0.70–1.30)
Creatinine: 0.9 MG/DL (ref 0.40–1.20)
GFR est AA: >60 mL/min/{1.73_m2} (ref >60)
GFR est non-AA: >60 mL/min/{1.73_m2} (ref >60)
Globulin: 3.9 g/dL
Glucose: 38 mg/dL — CR (ref 70–99)
Glucose: 40 mg/dL — CL (ref 75–110)
Potassium: 3.5 mEq/L (ref 3.5–5.0)
Potassium: 3.9 mmol/L (ref 3.5–5.0)
Protein, total: 6.5 g/dL (ref 6.1–7.9)
Protein, total: 7.7 g/dL (ref 6.2–8.0)
Sodium: 141 mEq/L (ref 136–145)
Sodium: 140 mmol/L (ref 135–145)
eGFR (CKD-EPI): 85 (ref >=60)

## 2021-02-17 LAB — DIFFERENTIAL, MANUAL
ABS. MONOCYTES: 0.57 10*3/uL (ref 0.10–0.80)
ABS. NEUTROPHILS: 4.67 10*3/uL (ref 1.90–7.80)
Abs Lymphocytes: 2.3 10*3/uL (ref 1.00–4.50)
BRCH EOSINS: 8 %
BRCH NEUTROPHIL: 57 %
Eos abs-DIF: 0.66 10*3/uL — ABNORMAL HIGH (ref 0.00–0.50)
LYMPHOCYTES: 22 %
MONOCYTES: 7 %
REACTIVE LYMPHS: 6 % — ABNORMAL HIGH (ref 0–0)

## 2021-02-17 LAB — LIPASE
Lipase: 33 U/L (ref 10–58)
Lipase: 33 U/L (ref 10–58)

## 2021-02-17 LAB — GLUCOSE, POC: Glucose (POC): 144 mg/dL — ABNORMAL HIGH (ref 75–110)

## 2021-02-17 LAB — POCT GLUCOSE: POC Glucose: 144 mg/dL — ABNORMAL HIGH (ref 75–110)

## 2021-02-17 LAB — URINALYSIS WITH REFLEX TO CULTURE
Bilirubin, Urine: NEGATIVE
Blood, Urine: NEGATIVE
Glucose, Ur: NEGATIVE mg/dL
Leukocyte Esterase, Urine: NEGATIVE
Nitrite, Urine: NEGATIVE
Protein, UA: NEGATIVE mg/dL
Specific Gravity, UA: 1.025 NA (ref 1.005–1.030)
Urobilinogen, UA, POCT: 0.2 EU/dL (ref 0.1–1.0)
pH, UA: 6 NA (ref 5.0–9.0)

## 2021-02-17 LAB — CBC WITH AUTO DIFFERENTIAL
Basophils %: 0 %
Basophils Absolute: 0 10*3/uL (ref 0.0–0.2)
Eosinophils %: 6 %
Eosinophils Absolute: 0.5 10*3/uL (ref 0.0–0.5)
Granulocyte Absolute Count: 0 10*3/uL (ref 0.0–0.1)
Hematocrit: 36.3 % — ABNORMAL LOW (ref 42.0–52.0)
Hematocrit: 38.4 % — ABNORMAL LOW (ref 42.0–52.0)
Hemoglobin: 11.9 g/dL — ABNORMAL LOW (ref 14.0–18.0)
Hemoglobin: 12.6 g/dL — ABNORMAL LOW (ref 14.0–18.0)
Immature Granulocytes: 0 %
Lymphocytes %: 22 %
Lymphocytes Absolute: 2 10*3/uL (ref 1.0–4.5)
MCH: 30.1 pg (ref 28.0–34.0)
MCH: 29.9 PG (ref 28.0–34.0)
MCHC: 32.8 g/dL (ref 32.0–36.0)
MCHC: 32.8 g/dL (ref 32.0–36.0)
MCV: 91.9 fL (ref 80.0–100.0)
MCV: 91 FL (ref 80.0–100.0)
MPV: 10.7 fL (ref 8.5–12.0)
MPV: 10 FL (ref 7.0–12.0)
Monocytes %: 10 %
Monocytes Absolute: 0.9 10*3/uL — ABNORMAL HIGH (ref 0.1–0.8)
Neutrophils %: 62 %
Neutrophils Absolute: 5.7 10*3/uL (ref 1.9–7.8)
Nucleated RBCs: 0 PER 100 WBC (ref 0.0–1.0)
Platelets: 166 10*3/uL (ref 150–400)
Platelets: 151 10*3/uL (ref 150–400)
RBC: 3.95 Mil/uL — ABNORMAL LOW (ref 4.50–6.00)
RBC: 4.22 M/uL — ABNORMAL LOW (ref 4.50–6.00)
RDW-CV,2213: 13.4 % (ref 11.5–13.6)
RDW-SD: 44.9 fL (ref 35.0–47.0)
RDW: 13.2 % (ref 11.5–13.5)
WBC: 8.2 10*3/uL (ref 4.7–10.8)
WBC: 9.2 10*3/uL (ref 4.8–10.8)

## 2021-02-17 LAB — COMPREHENSIVE METABOLIC PANEL
ALT: 9 IU/L (ref 0–41)
ALT: 15 U/L (ref 3–35)
AST: 16 IU/L (ref 0–40)
AST: 21 U/L (ref 15–40)
Albumin/Globulin Ratio: 1
Albumin: 3.8 g/dL (ref 3.5–5.2)
Albumin: 3.8 g/dL (ref 3.5–5.0)
Alkaline Phosphatase: 68 IU/L (ref 40–129)
Alkaline Phosphatase: 56 U/L (ref 35–100)
Anion Gap: 13 mEq/L (ref 3–16)
Anion Gap: 17 mmol/L
BUN: 15 mg/dL (ref 8–23)
BUN: 17 MG/DL (ref 7–20)
Bun/Cre Ratio: 19 NA
CO2: 22 mEq/L (ref 22–32)
CO2: 23 mmol/L (ref 20–32)
Calcium: 9 mg/dL (ref 8.8–10.3)
Calcium: 9.2 MG/DL (ref 8.8–10.5)
Chloride: 106 mEq/L (ref 98–107)
Chloride: 104 mmol/L (ref 100–110)
Creatinine: 0.92 mg/dL (ref 0.70–1.30)
Creatinine: 0.9 MG/DL (ref 0.40–1.20)
EGFR (CKD-EPI): 85 NA (ref >=60)
EGFR IF NonAfrican American: >60 mL/min/{1.73_m2} (ref >60)
GFR African American: >60 mL/min/{1.73_m2} (ref >60)
Globulin: 3.9 g/dL
Glucose: 38 mg/dL — CR (ref 70–99)
Glucose: 40 mg/dL — CL (ref 75–110)
Potassium: 3.5 mEq/L (ref 3.5–5.0)
Potassium: 3.9 mmol/L (ref 3.5–5.0)
Sodium: 141 mEq/L (ref 136–145)
Sodium: 140 mmol/L (ref 135–145)
Total Bilirubin: 0.3 mg/dL (ref 0.0–1.0)
Total Bilirubin: 0.7 mg/dL (ref 0.10–1.20)
Total Protein: 6.5 g/dL (ref 6.1–7.9)
Total Protein: 7.7 g/dL (ref 6.2–8.0)

## 2021-02-17 MED ORDER — DEXTROSE 10% IN WATER (D10W) IV
10 % | Freq: Once | INTRAVENOUS | Status: DC
Start: 2021-02-17 — End: 2021-02-17
  Administered 2021-02-17: 18:00:00 via INTRAVENOUS

## 2021-02-17 MED ORDER — DEXTROSE 10% IN WATER (D10W) IV
10 % | Freq: Once | INTRAVENOUS | Status: AC
Start: 2021-02-17 — End: 2021-02-17

## 2021-02-17 MED ORDER — DEXTROSE 10% IN WATER (D10W) IV
10 % | INTRAVENOUS | Status: AC
Start: 2021-02-17 — End: 2021-02-17
  Administered 2021-02-17: 18:00:00 via INTRAVENOUS

## 2021-02-17 MED ORDER — SODIUM CHLORIDE 0.9 % IJ SYRG
Freq: Once | INTRAMUSCULAR | Status: DC
Start: 2021-02-17 — End: 2021-02-17

## 2021-02-17 MED ORDER — SODIUM CHLORIDE 0.9% BOLUS IV
0.9 % | Freq: Once | INTRAVENOUS | Status: AC
Start: 2021-02-17 — End: 2021-02-17
  Administered 2021-02-17: 17:00:00 via INTRAVENOUS

## 2021-02-17 MED FILL — BD POSIFLUSH NORMAL SALINE 0.9 % INJECTION SYRINGE: INTRAMUSCULAR | Qty: 10

## 2021-02-17 MED FILL — DEXTROSE 10% IN WATER (D10W) IV: 10 % | INTRAVENOUS | Qty: 250

## 2021-02-17 MED FILL — SODIUM CHLORIDE 0.9 % IV: INTRAVENOUS | Qty: 1000

## 2021-02-17 NOTE — Telephone Encounter (Signed)
Appointment cancelled, is there availability in covid clinic?

## 2021-02-17 NOTE — Telephone Encounter (Signed)
We are full today, if appropriate we can schedule for tomorrow.

## 2021-02-17 NOTE — ED Notes (Signed)
Report called to Sarah @ [facility]. Also contacted pt's POA, [name], and updated plan. Neither is able to pick pt up at this time.

## 2021-02-17 NOTE — Telephone Encounter (Signed)
Lab called with blood sugar of 38. This was ordered by leane cyr.

## 2021-02-17 NOTE — ED Notes (Signed)
2 daY hx of diarrhea , from [facility]

## 2021-02-17 NOTE — ED Provider Notes (Signed)
HPI   80 year old male is here from nursing home by EMS for evaluation of diarrhea that did he states has been intermittent for the past 1 week.  Denies that the pain, nausea vomiting.  Denies any blood or mucus in stool.  No fevers no chills.  Denies any chest pain or abdominal pain no shortness of breath.  Denies any change in her urinary symptoms.  Past Medical History:   Diagnosis Date   ??? Dementia (HCC)    ??? Diabetes (HCC)    ??? Grief     loss of wife 03-13-13   ??? Hypertension    ??? Presence of cardiac pacemaker        History reviewed. No pertinent surgical history.      History reviewed. No pertinent family history.    Social History     Socioeconomic History   ??? Marital status: SINGLE     Spouse name: Not on file   ??? Number of children: Not on file   ??? Years of education: Not on file   ??? Highest education level: Not on file   Occupational History   ??? Not on file   Tobacco Use   ??? Smoking status: Never Smoker   ??? Smokeless tobacco: Never Used   Substance and Sexual Activity   ??? Alcohol use: Never   ??? Drug use: Never   ??? Sexual activity: Not on file   Other Topics Concern   ??? Not on file   Social History Narrative    Widowed without children. Wife died about 2013/03/13    Previous employment as a Runner, broadcasting/film/video and principal    4 years of college     Social Determinants of Health     Financial Resource Strain:    ??? Difficulty of Paying Living Expenses: Not on file   Food Insecurity:    ??? Worried About Programme researcher, broadcasting/film/video in the Last Year: Not on file   ??? Ran Out of Food in the Last Year: Not on file   Transportation Needs:    ??? Freight forwarder (Medical): Not on file   ??? Lack of Transportation (Non-Medical): Not on file   Physical Activity:    ??? Days of Exercise per Week: Not on file   ??? Minutes of Exercise per Session: Not on file   Stress:    ??? Feeling of Stress : Not on file   Social Connections:    ??? Frequency of Communication with Friends and Family: Not on file   ??? Frequency of Social Gatherings with Friends and  Family: Not on file   ??? Attends Religious Services: Not on file   ??? Active Member of Clubs or Organizations: Not on file   ??? Attends Banker Meetings: Not on file   ??? Marital Status: Not on file   Intimate Partner Violence:    ??? Fear of Current or Ex-Partner: Not on file   ??? Emotionally Abused: Not on file   ??? Physically Abused: Not on file   ??? Sexually Abused: Not on file   Housing Stability:    ??? Unable to Pay for Housing in the Last Year: Not on file   ??? Number of Places Lived in the Last Year: Not on file   ??? Unstable Housing in the Last Year: Not on file         ALLERGIES: Shellfish derived    Review of Systems  Pertinent positives and negatives per the HPI, the  remainder of the 10 systems reviewed is negative.  Vitals:    02/17/21 1149   BP: (!) 136/95   Pulse: 66   Resp: 18   Temp: 97.7 ??F (36.5 ??C)   SpO2: 98%   Weight: 81 kg (178 lb 9.2 oz)   Height: 5\' 9"  (1.753 m)            Physical Exam  Vitals and nursing note reviewed.   Constitutional:       General: He is not in acute distress.     Appearance: Normal appearance. He is not ill-appearing or toxic-appearing.   HENT:      Head: Normocephalic and atraumatic.      Nose: Nose normal.      Mouth/Throat:      Mouth: Mucous membranes are moist.      Pharynx: Oropharynx is clear.   Eyes:      Conjunctiva/sclera: Conjunctivae normal.      Pupils: Pupils are equal, round, and reactive to light.   Cardiovascular:      Rate and Rhythm: Normal rate and regular rhythm.      Heart sounds: Normal heart sounds. No murmur heard.  No friction rub. No gallop.    Pulmonary:      Effort: Pulmonary effort is normal.      Breath sounds: Normal breath sounds. No wheezing, rhonchi or rales.   Abdominal:      General: Abdomen is flat. Bowel sounds are normal. There is no distension.      Palpations: Abdomen is soft. There is no mass.      Tenderness: There is no abdominal tenderness. There is no right CVA tenderness, left CVA tenderness, guarding or rebound.       Hernia: No hernia is present.   Musculoskeletal:         General: Normal range of motion.   Skin:     General: Skin is warm and dry.   Neurological:      Mental Status: He is alert. Mental status is at baseline.   Psychiatric:         Mood and Affect: Mood normal.         Behavior: Behavior normal.          MDM    ED Course as of 02/17/21 1457   Tue Feb 17, 2021   1339 Glucose is 40. Will give him the tendon.  He does have anemia and there is chronically stable and improved from last visit.  Urine is negative for infection.  He is insulin dependent diabetic.  No electrolyte abnormality on labs.  No anion gap. [JG]   1339 Re-evaluated the patient.  He is mentating appropriately still at baseline.  Will give this the D10 and then give him some food and recheck a POC glucose. [JG]   1449 POC glucose is 144.  The patient also had outpatient labs this morning results were consistent with a glucose of 38.  This could be not fasting labs.  Meds and nurse or return to the nursing facility to discuss with them.  Otherwise glucose is up to 144. He tolerated by mouth intake without any difficulty.  The likely will need to hold his insulin and monitor closely before administering any other insulin medications.  Otherwise is at his baseline.  No other electrolyte abnormality or infectious etiology.  She would be able to return back to the nursing facility. [JG]      ED Course User Index  [JG]  Renee Pain M, [place]     Patient presents for evaluation of intermittent episodes of diarrhea for the past 1 week.  He is well appearing nontoxic in no distress on exam.  He is pleasantly demented at baseline and easily reoriented.  No CVA tenderness on exam, his abdomen is benign nonfocal non peritoneal.  Was not able to reproduce any pain discomfort or tenderness.  Bowel sounds are normal.  Will obtain basic labs to rule out any acute metabolic derangement or electrolyte abnormality.  Well hydrated with IV fluids.         Procedures      NIH Stroke Scale           Vital Signs for this visit:  Patient Vitals for the past 12 hrs:   Temp Pulse Resp BP SpO2   02/17/21 1149 97.7 ??F (36.5 ??C) 66 18 (!) 136/95 98 %       Lab findings during this visit (only abnormal values will be noted, if no value noted then the result was normal range):  Labs Reviewed   CBC WITH AUTOMATED DIFF - Abnormal; Notable for the following components:       Result Value    RBC 4.22 (*)     HGB 12.6 (*)     HCT 38.4 (*)     ABS. MONOCYTES 0.9 (*)     All other components within normal limits   METABOLIC PANEL, COMPREHENSIVE - Abnormal; Notable for the following components:    Glucose 40 (*)     All other components within normal limits   UA WITH REFLEX MICRO AND CULTURE - Abnormal; Notable for the following components:    Ketone TRACE (*)     All other components within normal limits   GLUCOSE, POC - Abnormal; Notable for the following components:    Glucose (POC) 144 (*)     All other components within normal limits   LIPASE       Radiology studies during this visit  No results found.    Medications given in the ED:  Medications   sodium chloride (NS) flush 5-10 mL (has no administration in time range)   sodium chloride 0.9 % bolus infusion 1,000 mL (1,000 mL IntraVENous New Bag 02/17/21 1303)   dextrose 10% infusion 250 mL (250 mL IntraVENous New Bag 02/17/21 1339)       Diagnosis:    ICD-10-CM ICD-9-CM   1. Diarrhea, unspecified type  R19.7 787.91   2. Hypoglycemia  E16.2 251.2       Condition at disposition:  Condition stable    Disposition:  Discharged    Discharge prescriptions and/or changes if applicable:  Current Discharge Medication List          Follow-up:  Ramon Dredge, FNP  [address] [place] [address]  [phone]    Call   If symptoms worsen      Please note that portions of this document were created using the M*Modal Fluency Direct dictation system.  Any inconsistencies or typographical errors may be the result of mis-transcription that persist in  spite of proof-reading and should be addressed with the document creator.

## 2021-02-17 NOTE — Telephone Encounter (Signed)
Scheduled for evaluation on Thursday.

## 2021-02-17 NOTE — ED Notes (Signed)
BS 144

## 2021-02-17 NOTE — Telephone Encounter (Signed)
[name] is sending the patient to the ED for evaluation. Patient is pale, not feeling good and his blood sugar is not rising to a normal level. He is currently at 63.     Both [name] and [facility] are aware.

## 2021-02-17 NOTE — Telephone Encounter (Signed)
Patient lives at assisted living home and can only get rides tuesdays and thursdays.    Can we schedule him Thursday?    Signed By: Candi Leash     February 17, 2021

## 2021-02-17 NOTE — Progress Notes (Signed)
Alejandro [name], unit clerk sorted out issue with ride and he is being picked up and transported back to [place]. No Care Management needs identified.

## 2021-02-18 ENCOUNTER — Encounter

## 2021-02-19 ENCOUNTER — Ambulatory Visit: Payer: MEDICARE | Primary: Registered Nurse

## 2021-02-19 ENCOUNTER — Inpatient Hospital Stay: Payer: MEDICARE | Primary: Registered Nurse

## 2021-02-22 NOTE — Telephone Encounter (Signed)
Pt was seen in our ED 4/12, report in chart.

## 2021-02-25 NOTE — Telephone Encounter (Signed)
Pt POA, Alejandro [name], called and cancelled there appointment 02/26/21. She will like a call back to reschedule the appointment. She additionally is wondering if it would be beneficial if she has his records from the last few months at [facility] faxed over before the next visit.       [phone]

## 2021-02-25 NOTE — Telephone Encounter (Signed)
Called [name] and rescheduled the appointment

## 2021-02-26 ENCOUNTER — Encounter: Primary: Registered Nurse

## 2021-02-26 NOTE — Telephone Encounter (Signed)
Let's schedule patient for annual physical if possible; he has advancing dementia and is a resident at [facility]. All scheduling goes through his niece [name].

## 2021-02-26 NOTE — Telephone Encounter (Signed)
Spoke with Alejandro [name], patient is scheduled for 03/06/21 at 1pm

## 2021-03-02 NOTE — Telephone Encounter (Signed)
Med Tech on duty of [facility] called in regards to an update from pt. Pt took a fall this morning at 6 am. Pt reports no injuries. Med Tech just wanted to let PCP know.     Please advise

## 2021-03-02 NOTE — Telephone Encounter (Signed)
Noted.
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
## 2021-03-06 ENCOUNTER — Ambulatory Visit: Attending: Registered Nurse | Primary: Registered Nurse

## 2021-03-06 ENCOUNTER — Ambulatory Visit: Admit: 2021-03-06 | Discharge: 2021-03-06 | Payer: MEDICARE | Attending: Registered Nurse | Primary: Registered Nurse

## 2021-03-06 DIAGNOSIS — Z0000 Encounter for general adult medical examination without abnormal findings: Secondary | ICD-10-CM

## 2021-03-06 NOTE — Telephone Encounter (Signed)
Patient was tested last week. However a Caregiver states that he is feeling fine now. I did not speak to [place] directly.    Please advise    Signed By: Candi Leash     March 06, 2021

## 2021-03-06 NOTE — Progress Notes (Signed)
[facility]   [place] [address]  [phone]  Alejandro [name]  Jul 31, 1941      Impression/Plan: Will see patient in 3-4 months for telephone or office visit followup, dementia, pain per preference of  patient/patient's Medical power of attorney, his niece Altha Harm.      Chronic Conditions Addressed Today     1. Late onset Alzheimer's dementia with behavioral disturbance ([place])      Will continue current medication regime         2. Hemiplegia and hemiparesis following cerebral infarction affecting left non-dominant side [facility])      Patient has been seen by physical therapy and is at baseline  Patient's niece, Altha Harm instructed to contact our office for any decline in mobility so that PT may be ordered          3. Annual physical exam - Primary      Continue to maintain quality of life through interventions of assisted living dementia unit at [facility], enjoying meals and walks in building - always with assistive device.                Chief Compliant: Alejandro [name]  80 y.o. male presents today for dementia, annual physical.    HPI:  80  year old gentleman who is a resident of the dementia unit at [facility], is here with his niece Altha Harm (his POA).    Patient has settled into living at  [facility].  He has an established routine.    Patient tells me he is somewhat bored at times, he enjoys his meals, watches some tv.          Current Outpatient Medications   Medication Instructions   ??? acetaminophen (TYLENOL) 650 mg, Oral, EVERY 4 HOURS AS NEEDED   ??? amLODIPine (NORVASC) 10 mg, Oral, DAILY   ??? benazepriL (LOTENSIN) 10 mg tablet No dose, route, or frequency recorded.   ??? Biofreeze (menthol) 2-6 g, Apply Externally, EVERY MORNING   ??? bisacodyL (DULCOLAX) 10 mg, Rectal, DAILY AS NEEDED   ??? cetirizine (ZYRTEC) 10 mg, Oral, DAILY   ??? donepeziL (ARICEPT) 5 mg, Oral, EVERY BEDTIME   ??? Eliquis 2.5 mg tablet TAKE 1 TABLET BY MOUTH TWICE DAILY.    ??? glucagon 1 mg, IntraVENous, ONCE   ??? glucose blood VI test strips (Prodigy No Coding) strip USE 1 STRIP TO TEST BLOOD GLUCOSE 4 TIMES DAILY.   ??? ibuprofen (MOTRIN) 400 mg, Oral, EVERY 6 HOURS AS NEEDED   ??? insulin glargine (LANTUS,BASAGLAR) 100 unit/mL (3 mL) inpn Inject 38 units Am and 10 units PM subq E10.65   ??? insulin lispro (HumaLOG KwikPen Insulin) 100 unit/mL kwikpen Inject 7-13 units breakfast, lunch, supper subq E10.65   ??? Insulin Syringe-Needle U-100 0.3 mL 30 syrg 1 Each, SubCUTAneous, 4 TIMES DAILY   ??? lancets misc Use one lancet to test blood glucose 4 times daily   ??? levothyroxine (SYNTHROID) 50 mcg, Oral, DAILY, Take 1 tablet at 4PM   ??? magnesium citrate solution 296 mL, Oral, NOW, Prn if no results after enema. If no results in 1 hour of completion of bowl protocol, call MD immediately for further orders.    ??? magnesium hydroxide (Phillips Milk of Magnesia) 400 mg/5 mL suspension 30 mL, Oral, BEDTIME PRN, Prn if no BM in 3 days   ??? ondansetron (ZOFRAN ODT) 4 mg, Oral, EVERY 8 HOURS AS NEEDED   ??? OTHER Monitor Blood Glucose 4 times  daily, before meals and at 8PM   ??? OTHER Please obtain weekly weights and fax to [facility] fax # [phone] attention Betsey Amen MA/FNP Duayne Cal. Thanks   ??? pantoprazole (PROTONIX) 40 mg tablet TAKE 1 TABLET BY MOUTH ONCE A DAY   ??? pen needle,diabetic dual safty (BD AutoShield Duo Pen Needle) 30 gauge x 3/16" ndle For use with insulin pens 5 times daily   ??? QUEtiapine (SEROquel) 50 mg tablet TAKE 1 TABLET BY MOUTH TWICE DAILY.   ??? rosuvastatin (CRESTOR) 10 mg, Oral, EVERY BEDTIME   ??? Tab-A-Vite 400 mcg tab tablet No dose, route, or frequency recorded.   ??? tamsulosin (FLOMAX) 0.4 mg capsule TAKE 1 CAPSULE BY MOUTH ONCE DAILY.   ??? Unistik 3 Comfort Device kit USE ONE LANCET TO TEST BLOOD GLUCOSE 4 TIMES DAILY        Allergies   Allergen Reactions   ??? Shellfish Derived Anaphylaxis              There is no immunization history on file for this  patient.       ROS   See HPI      Vitals:    03/06/21 1329   BP: 124/80   Weight: 168 lb 9.6 oz (76.5 kg)       Physical Exam  Constitutional:       General: He is not in acute distress.     Appearance: Normal appearance. He is not ill-appearing, toxic-appearing or diaphoretic.   HENT:      Head: Normocephalic and atraumatic.   Eyes:      General: No scleral icterus.        Right eye: No discharge.         Left eye: No discharge.      Conjunctiva/sclera: Conjunctivae normal.   Cardiovascular:      Rate and Rhythm: Normal rate and regular rhythm.      Heart sounds: Normal heart sounds.   Pulmonary:      Effort: Pulmonary effort is normal.      Breath sounds: Normal breath sounds.   Skin:     General: Skin is warm and dry.   Neurological:      Mental Status: He is alert.      Comments: Appropriate in conversation  Forgetful  Pleasant affect   Psychiatric:         Mood and Affect: Mood normal.         Behavior: Behavior normal.              30 minutes spent on this office visit reviewing medical records, documenting, interviewing assessing, planning, educating.  I reviewed and the patient's Past Medical, Surgical, Family and Social History in addition to their Problems, Meds and Allergies.  Recent results reviewed  Any problems listed in the Assessment and Plan were assessed during today's visit and if not explicitly discussed are stable based on history, physical exam, review of pertinent labs, studies and medications.         Follow-up and Dispositions    ?? Return for  3-4 mo for f/u dementia for tp or office vs per preference of  pt/pt's Medical POA niece Altha Harm.       Future Appointments   Date Time Provider [place]   05/04/2021 12:30 PM Nori Riis, RN [facility]       Patient was seen by Duayne Cal, FNP and  Duayne Cal, FNP  03/06/2021      [facility]   [place] [address]  [phone]

## 2021-03-06 NOTE — Telephone Encounter (Signed)
Has patient been tested for COVID due to his symptoms?

## 2021-03-06 NOTE — Telephone Encounter (Signed)
Patient will get rapid test done before coming to appointment today, he currently is just tired and has little energy    Signed By: Candi Leash     March 06, 2021

## 2021-03-09 NOTE — Telephone Encounter (Signed)
Upon checkout there were no follow up notes, does anything need to be scheduled?

## 2021-03-12 NOTE — Assessment & Plan Note (Signed)
Patient has been seen by physical therapy and is at baseline  Patient's niece, Alejandro [name] instructed to contact our office for any decline in mobility so that PT may be ordered

## 2021-03-12 NOTE — Assessment & Plan Note (Signed)
Will continue current medication regime

## 2021-03-16 NOTE — Telephone Encounter (Signed)
Patient was seen by PCP for annual PE on 03/06/2021

## 2021-03-16 NOTE — Assessment & Plan Note (Signed)
Continue to maintain quality of life through interventions of assisted living dementia unit at [facility], enjoying meals and walks in building - always with assistive device.

## 2021-03-17 MED ORDER — LEVOTHYROXINE 50 MCG TAB
50 mcg | ORAL_TABLET | Freq: Every day | ORAL | 0 refills | Status: AC
Start: 2021-03-17 — End: ?

## 2021-03-17 MED ORDER — ROSUVASTATIN 10 MG TAB
10 mg | ORAL_TABLET | Freq: Every evening | ORAL | 0 refills | Status: AC
Start: 2021-03-17 — End: ?

## 2021-03-17 NOTE — Telephone Encounter (Signed)
 Medications Requested:  Requested Prescriptions     Pending Prescriptions Disp Refills   . levothyroxine  (SYNTHROID ) 50 mcg tablet 90 Tablet 0     Sig: Take 1 Tablet by mouth daily. Take 1 tablet at 4PM   . rosuvastatin  (CRESTOR ) 10 mg tablet 90 Tablet 0     Sig: Take 1 Tablet by mouth nightly.       Preferred Pharmacy:   [facility] - [place], [place] - [address]  [address]  [place] [address]  Phone: [phone] Fax: [phone]      Date of Last Refill: 12/22/2020  Prescription Refill Protocol reviewed:  Yes    Allergy List Reviewed and Verified: Yes    Possible medication to medication interactions reviewed: Yes    Last appt @ PCP Office: 03/06/2021    Future Appointments   Date Time Provider Department Center   05/04/2021 12:30 PM Bethel Katheryn LABOR, RN [facility]       MOST RECENT BLOOD PRESSURES  BP Readings from Last 3 Encounters:   03/06/21 124/80   02/17/21 129/65   01/29/21 (!) 172/63         MOST RECENT LAB DATA  Lab Results   Component Value Date/Time    Creatinine 0.90 02/17/2021 12:41 PM    Potassium 3.9 02/17/2021 12:41 PM    ALT (SGPT) 15 02/17/2021 12:41 PM    TSH 3.17 07/22/2020 08:21 AM    Cholesterol, total 107 (L) 08/08/2020 11:00 AM    HGB 12.6 (L) 02/17/2021 12:41 PM    HCT 38.4 (L) 02/17/2021 12:41 PM    Vitamin B12 735 07/22/2020 08:21 AM    Hemoglobin A1c 8.6 (H) 07/22/2020 08:21 AM    Hemoglobin A1c (POC) 9.1 06/10/2020 02:00 PM

## 2021-03-17 NOTE — Telephone Encounter (Signed)
Alejandro [name]  December 21, 1940      Call back needed: no    Preferred call back number: Call preference: Home phone   [phone] (home)    Telephone Information:   Mobile [phone]        Medications Requested:  Requested Prescriptions     Pending Prescriptions Disp Refills   . levothyroxine (SYNTHROID) 50 mcg tablet 90 Tablet 0     Sig: Take 1 Tablet by mouth daily. Take 1 tablet at 4PM   . rosuvastatin (CRESTOR) 10 mg tablet 90 Tablet 0     Sig: Take 1 Tablet by mouth nightly.       Preferred Pharmacy:   [facility] - [place], [place] - [address]  [address]  [place] [address]  Phone: [phone] Fax: [phone]

## 2021-04-05 ENCOUNTER — Encounter

## 2021-04-05 MED ORDER — AMLODIPINE 10 MG TAB
10 mg | ORAL_TABLET | ORAL | 11 refills | Status: AC
Start: 2021-04-05 — End: ?

## 2021-04-09 ENCOUNTER — Encounter

## 2021-04-09 MED ORDER — INSULIN LISPRO 100 UNIT/ML (3 ML) SUB-Q PEN
100 unit/mL | SUBCUTANEOUS | 0 refills | Status: DC
Start: 2021-04-09 — End: 2021-07-15

## 2021-04-09 NOTE — Telephone Encounter (Signed)
Please sign off insulin, thank you.

## 2021-04-09 NOTE — Telephone Encounter (Signed)
Ordered hemoglobin A1c for labs    Reordered insulin

## 2021-04-09 NOTE — Telephone Encounter (Signed)
 Medication fail protocol--needs labs done.    Medications Requested:  Requested Prescriptions     Pending Prescriptions Disp Refills   . insulin  lispro (HumaLOG  KwikPen Insulin ) 100 unit/mL kwikpen 15 mL 0     Sig: Inject 7-13 units breakfast, lunch, supper subq E10.65       Preferred Pharmacy:   [facility] - [place], [place] - [address]  [address]  [place] [address]  Phone: [phone] Fax: [phone]      Date of Last Refill: 02/16/2021    Prescription Refill Protocol reviewed:  Yes-- failed protocol    Allergy List Reviewed and Verified: Yes    Possible medication to medication interactions reviewed: Yes    Last appt @ PCP Office: 03/06/2021    Future Appointments   Date Time Provider Department Center   05/04/2021 12:30 PM Bethel Katheryn LABOR, RN [facility]       MOST RECENT BLOOD PRESSURES  BP Readings from Last 3 Encounters:   03/06/21 124/80   02/17/21 129/65   01/29/21 (!) 172/63         MOST RECENT LAB DATA  Lab Results   Component Value Date/Time    Creatinine 0.90 02/17/2021 12:41 PM    Potassium 3.9 02/17/2021 12:41 PM    ALT (SGPT) 15 02/17/2021 12:41 PM    TSH 3.17 07/22/2020 08:21 AM    Cholesterol, total 107 (L) 08/08/2020 11:00 AM    HGB 12.6 (L) 02/17/2021 12:41 PM    HCT 38.4 (L) 02/17/2021 12:41 PM    Vitamin B12 735 07/22/2020 08:21 AM    Hemoglobin A1c 8.6 (H) 07/22/2020 08:21 AM    Hemoglobin A1c (POC) 9.1 06/10/2020 02:00 PM

## 2021-04-09 NOTE — Telephone Encounter (Signed)
Alejandro [name] from [facility] calling, to request copies of signed doctor's orders-they are going to be changing Pharmacies soon and need these.    Fax [phone]

## 2021-04-09 NOTE — Telephone Encounter (Signed)
 Evart Mcdonnell  [birth year]      Call back needed: no    Preferred call back number: Call preference: Home phone   [phone] (home)    Telephone Information:   Mobile [phone]        Medications Requested:  Requested Prescriptions     Pending Prescriptions Disp Refills   . insulin  lispro (HumaLOG  KwikPen Insulin ) 100 unit/mL kwikpen 15 mL 0     Sig: Inject 7-13 units breakfast, lunch, supper subq E10.65       Preferred Pharmacy:   [facility] - [place], [place] - [address]  [address]  [place] [address]  Phone: [phone] Fax: [phone]

## 2021-04-15 NOTE — Telephone Encounter (Signed)
[name] called and stated that they faxed over 1 page blocked orders and that needs to be signed and faxed over because they are switching from [facility] drug to [facility]   This is really time sensitive she needs to order pt medication     Fax: [phone]  Phone: [phone]

## 2021-04-16 NOTE — Telephone Encounter (Signed)
Alejandro [name] called to f/u on this. This needs to be done today please.

## 2021-04-16 NOTE — Telephone Encounter (Signed)
Confirmed that medication list was correct and matches what we have on file.    Signed By: Candi Leash     April 16, 2021

## 2021-04-16 NOTE — Telephone Encounter (Signed)
Documents faxed, [facility].    Signed By: Candi Leash     April 16, 2021
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
## 2021-04-22 NOTE — Telephone Encounter (Signed)
[name] called, and once PCP has a chance to review this, she would like a call back from MA or PCP.    [phone]

## 2021-04-22 NOTE — Telephone Encounter (Signed)
[name] called to let PCP know that pt is experiencing increased agitation.  She left no further details and satted that she is now going to contact pts Guardian to update. CB is [phone]

## 2021-05-04 ENCOUNTER — Institutional Professional Consult (permissible substitution): Admit: 2021-05-04 | Discharge: 2021-05-04 | Payer: MEDICARE | Primary: Registered Nurse

## 2021-05-04 DIAGNOSIS — E1065 Type 1 diabetes mellitus with hyperglycemia: Secondary | ICD-10-CM

## 2021-05-04 LAB — AMB POC HEMOGLOBIN A1C
Hemoglobin A1C, POC: 6.5 %
Hemoglobin A1c (POC): 6.5 %

## 2021-05-04 NOTE — Progress Notes (Signed)
 [facility]  [address] [place] [address]  [phone]    Diabetes Visit Note    Alejandro [name]  is a 80 y.o. male with type 1  diabetes, uncontrolled.     Presents for follow up diabetes visit with registered nurse/certified diabetes educator.     The purpose of today's visit is for follow-up to support with blood sugar management/education and may include teaching of various AADE education topics as applicable;   Review of current blood sugars and goals; review of recent labs related to diabetes; review of current behaviors related to diabetes management;   Assessment/education related to hypoglycemia and hyperglycemia; prevention of complications; meal planning; psychosocial needs; goal setting for behavioral change related to diabetes/chronic disease management; discussion and recommendations for insulin  and or other medication initiation/titration; plan for follow up. Patient continues to receive ongoing diabetes education at subsequent clinic visits.       Pertinent patient history:  Lives  [facility], staff gives injections  Alejandro [name], niece helps getting him to appointments    Current Status/Assessment:  A1C 6.5% today   No Mar or BGs were brought to this visit today. I called Alejandro [name] , spoke to Alejandro [name] to request he send the [facility] and BG records on Alejandro [name], which he said he will do.   I will use these to adjust dosing if necessary and review current BGs.      Basaglar  38 units AM; 10 units PM  Novolog 7 units with meals   Using 3 units for each 30 points over 150, or 1:10 to correct, up to 9 extra units2This seems like an aggressive corrections    Alejandro [name] states he has had some lows, I will corrorborate with staff at [place] and review records  He may need lower basal dose and less aggressive corrector    Some level of variation is expected with type 1 diabetes.     Alejandro [name] doesn't feel low and doesn't think they are happening.     He eats 3 meals, lunch is the  largest      Summary/Plan:  I need numbers and a MAR from [facility]           Patient is testing blood glucose  times per day for last 60 days or uses sensor  Patient is taking 3 or more injections of insulin  daily or on insulin  pump  Patient requires frequent insulin  adjustments based on blood sugar readings (meter or CGM)    Hypoglycemia :   Positive with unawareness  Nocturnal hypoglycemia  Carries/understands treatment for lows  Dawn phenomenon      For CGM/Pump patients:    Patient currently checks blood glucose levels 4 or more times a day.  Patient currently injects insulin  3 or more times per day or is currently using an insulin  pump.  Unexpected and significant blood sugar fluctuations despite therapeutic insulin  adjustments  Patient's insulin  treatment regimen requires frequent self-initiated /or automated insulin  adjustments.   By using CGM therapy improvement to self care is evident by:  Improvement of measurable indicators, such as A1C  Time in hypoglycemia improved  Time in hyperglycemia improved  Increased ability to avoid hypoglycemia    Patient is utilizing CGM/sensor therapy as recommended; outcomes improved with sensor use; continued use of sensor therapy medically necessary/recommended.    The patient has had a doctor visit to evaluate the diabetes within the last 6 months; receives education pertaining to self management at each clinic visit.   The patient is  given appropriate education/review of DKA prevention/recognition; sick day protocol; pump back up supply kit as appropriate.  The patient is given information on clinic contact information/emergency contact information    ( Recommend annual dilated eye exam; annual urine microalbumin/creat; A1C every 3-6 months; Annual comprehensive foot exam by PCP and regular self care; semi annual dental visits)  Preventive Care/Standards Reviewed with patient   Pre-Conception/pregnancy planning education provided if  appropriate    Labs:    Recent labs reviewed. Meds, allergies, vitals, tobacco use reviewed.   Eye exam date reviewed/ provider contacted for clinic notes.     Lab Results   Component Value Date/Time    Hemoglobin A1c 8.6 (H) 07/22/2020 08:21 AM    Hemoglobin A1c (POC) 9.1 06/10/2020 02:00 PM     Lab Results   Component Value Date/Time    TSH 3.17 07/22/2020 08:21 AM     No results found for: MCACR, MCA1, MCA2, MCA3, MCAU, MCAU2, MCALPOCT  Lab Results   Component Value Date/Time    Sodium 140 02/17/2021 12:41 PM    Potassium 3.9 02/17/2021 12:41 PM    Chloride 104 02/17/2021 12:41 PM    CO2 23 02/17/2021 12:41 PM    Anion gap 17 02/17/2021 12:41 PM    Glucose 40 (LL) 02/17/2021 12:41 PM    BUN 17 02/17/2021 12:41 PM    Creatinine 0.90 02/17/2021 12:41 PM    BUN/Creatinine ratio 19 02/17/2021 12:41 PM    GFR est AA >60 02/17/2021 12:41 PM    GFR est non-AA >60 02/17/2021 12:41 PM    Calcium  9.2 02/17/2021 12:41 PM    Bilirubin, total 0.70 02/17/2021 12:41 PM    Alk. phosphatase 56 02/17/2021 12:41 PM    Protein, total 7.7 02/17/2021 12:41 PM    Albumin 3.8 02/17/2021 12:41 PM    Globulin 3.9 02/17/2021 12:41 PM    A-G Ratio 1.0 02/17/2021 12:41 PM    ALT (SGPT) 15 02/17/2021 12:41 PM        There were no vitals taken for this visit.    Labs Reviewed with patient as appropriate.     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, N13.8    Unspecified protein-calorie malnutrition (HCC) E46    Hemiplegia and hemiparesis following cerebral infarction affecting left non-dominant side (HCC) I69.354    Other abnormalities of gait and mobility R26.89    Generalized muscle  weakness M62.81    Cognitive communication deficit R41.841    Lesion of right external ear H61.91    Type 1 diabetes mellitus with hyperglycemia, with long-term current use of insulin  (HCC) E10.65    DKA, type 1 (HCC) E10.10    Rhinorrhea J34.89    Shortness of breath R06.02    Multiple falls R29.6    COVID-19 U07.1    DKA (diabetic ketoacidoses) E11.10    Dementia (HCC) F03.90    Acute hypoxemic respiratory failure due to COVID-19 (HCC) U07.1, J96.01    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 Wife died about September 12, 2013    Previous employment as a Runner, broadcasting/film/video and principal    4 years of college     No family history on file.      Patient questions were answered; patient is in agreement to plan of care and states understanding of same.      Diabetes Care Recommendations:  Monitor blood sugars as directed   You are invited to contact or send blood sugars to us  as needed for review  Exercise daily  Monitor portions carefully/plate method of portion control/carb counting  Cut back on starchy carbs  Stay well hydrated by drinking at least 8 glasses of water per day  Check feet daily and wear shoes at all times  Check blood sugar before driving - must be at least 120  -Cholesterol assessment               -goals of LDL <, HDL >, triglycerides<                 -BP goal SBP <140 and DBP < 90              -continue BP medications as instructed by provider  -ACE/ARB treatment for kidney protection              -Continue as instructed by provider  -Yearly urine for microalbuminuria recommended              -Aspirin  81 mg if you have no stomach problems  or advised by provider.  -Discussed/ Gave Hypoglycemia treatment plan  -Yearly dilated eye exams  -Annual flu shot/ pneumovax   Hyperglycemia protocol reviewed  Hypoglycemia protocol reviewed as applicable  Exercise/insulin  protocol reviewed as applicable  Emergency contact: [phone], ask to have Sheryl paged.    Good Website: [email or web address]     Written education materials provided as appropriate (AADE approved)  Teachback  method of return demonstration (knowledge/skills ).  Materials for education include written, verbal, visual, hands on. Take-home handouts where applicable.  The patient participated actively and asked appropriate questions; Is able to use appropriate teachback to demonstrate knowledge and skills acquired.   Patient states understand of his/her current diabetes meds/insulin  and proper use.    No orders of the defined types were placed in this encounter.    Katheryn DELENA Dural, RN  05/04/2021  >50% of visit spent counseling concerning diabetes continuation of care/education topics as appropriate    Electronically signed by Katheryn DELENA Dural, RN @[facility] @    Voice recognition software used for transcription of this note. I apologize for typographical errors or phrases/words that  don't make sense.

## 2021-05-04 NOTE — Progress Notes (Signed)
I have reviewed the notes, assessments, and/or procedures performed by Trudee Kuster RN CDE, I concur with her documentation of Alejandro [name].

## 2021-05-05 ENCOUNTER — Telehealth

## 2021-05-05 NOTE — Telephone Encounter (Signed)
I have qued this up

## 2021-05-05 NOTE — Telephone Encounter (Signed)
[name] called and stated that pt family would like to get pt into podiatry. They do have a Dr. Claiborne Billings can go see him and before they set up an appt they would like to get a referral from PCP    Dr. Wende Bushy   Fax: [phone]

## 2021-05-05 NOTE — Telephone Encounter (Signed)
Ordered.

## 2021-05-06 NOTE — Telephone Encounter (Signed)
Spoke with pharmacist and went over medication list.  Was informed that this was a request from patient insurance company.  I spoke with Melissa at the number listed.  [facility]   [phone]

## 2021-05-06 NOTE — Telephone Encounter (Signed)
Pharmacy called and would like to go over current medication list with MA whenever one is available     [facility]   [phone]

## 2021-05-14 NOTE — Telephone Encounter (Signed)
This note is still open.  Please sign if no further action is required at this time. Thank you.

## 2021-05-25 ENCOUNTER — Telehealth

## 2021-05-25 NOTE — Telephone Encounter (Signed)
signed

## 2021-05-25 NOTE — Telephone Encounter (Signed)
-----   Message from Elsie Barge sent at 05/25/2021  2:50 PM EDT -----  Regarding: Elsie Barge Erskin Deward,     I just got off the line with [name], Bills left leg has gotten worse and we're in hope that we can get PT back to work with him. Bills even mentioned concerns with walking. I haven't been made aware of any witnessed recent falls.     Thank you!  [name] Barge

## 2021-05-25 NOTE — Telephone Encounter (Signed)
-----   Message from Elsie Barge sent at 05/25/2021  3:04 PM EDT -----  Regarding: Elsie Barge- [employer] supplies  Hi Deward,     Bill hasn't been utilizing his oxygen, yet the equipment is still at [facility]. If you no longer think this is necessary, [employer] will need an official request from your office to remove the equipment. I've been paying the bills and would love to save the $ if it's not necessary.     Thanks!  [name]

## 2021-05-25 NOTE — Telephone Encounter (Signed)
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 She is wondering if they can get a referral for physical therapy so he isn't wheelchair bound

## 2021-05-25 NOTE — Telephone Encounter (Signed)
I have qued up physical therapy order if ok

## 2021-05-26 NOTE — Telephone Encounter (Signed)
Would like to check patient's code status; do we have documentation that patient is a DNR or is he a Full code. Thanks

## 2021-06-04 NOTE — Telephone Encounter (Signed)
Printed letter for you to sign.  Tawni Pummel, CMA

## 2021-06-04 NOTE — Telephone Encounter (Signed)
Let's send a discontinuation of oxygen order to [employer]. Thanks

## 2021-07-14 MED ORDER — BD AUTOSHIELD DUO PEN NEEDLE 30 GAUGE X 3/16"
30 gauge x 3/16" | PEN_INJECTOR | 11 refills | Status: AC
Start: 2021-07-14 — End: ?

## 2021-07-14 NOTE — Telephone Encounter (Signed)
done

## 2021-07-14 NOTE — Telephone Encounter (Signed)
Medications Requested:pen needles  Requested Prescriptions      No prescriptions requested or ordered in this encounter       Preferred Pharmacy:   [facility] - [place], [place] - [address]  [address]  [place] [address]  Phone: [phone] Fax: [phone]      Date of Last Refill: unknown     Prescription Refill Protocol reviewed:  yes    Allergy List Reviewed and Verified: yes     Possible medication to medication interactions reviewed: yes    Last appt @ PCP Office: 06/10/2020    Future Appointments   Date Time Provider Department Center   09/02/2021  3:30 PM Mahala Menghini, RN [facility]       MOST RECENT BLOOD PRESSURES  BP Readings from Last 3 Encounters:   05/04/21 124/65   03/06/21 124/80   02/17/21 129/65         MOST RECENT LAB DATA  Lab Results   Component Value Date/Time    Creatinine 0.90 02/17/2021 12:41 PM    Potassium 3.9 02/17/2021 12:41 PM    ALT (SGPT) 15 02/17/2021 12:41 PM    TSH 3.17 07/22/2020 08:21 AM    Cholesterol, total 107 (L) 08/08/2020 11:00 AM    HGB 12.6 (L) 02/17/2021 12:41 PM    HCT 38.4 (L) 02/17/2021 12:41 PM    Vitamin B12 735 07/22/2020 08:21 AM    Hemoglobin A1c 8.6 (H) 07/22/2020 08:21 AM    Hemoglobin A1c (POC) 6.5 05/04/2021 12:40 PM

## 2021-07-14 NOTE — Telephone Encounter (Signed)
[facility] called requesting a refill on Autosheild pen needles; 5 mm 30 gauge.    Not in active med list.

## 2021-07-15 ENCOUNTER — Encounter

## 2021-07-15 MED ORDER — INSULIN LISPRO 100 UNIT/ML (3 ML) SUB-Q PEN
100 unit/mL | SUBCUTANEOUS | 3 refills | Status: AC
Start: 2021-07-15 — End: ?

## 2021-07-15 NOTE — Telephone Encounter (Signed)
[name] from [facility] calling, and needs a new order faxed to her and to [facility].   This is for the FlexPens.      Fax [phone] [facility] fax [phone]

## 2021-07-15 NOTE — Telephone Encounter (Signed)
Medications Requested: humalog pen  Requested Prescriptions      No prescriptions requested or ordered in this encounter       Preferred Pharmacy:   [facility] - [place], [place] - [address]  [address]  [place] [address]  Phone: [phone] Fax: [phone]      Date of Last Refill: 04/2021    Prescription Refill Protocol reviewed:  yes    Allergy List Reviewed and Verified: yes    Possible medication to medication interactions reviewed: yes    Last appt @ PCP Office: 06/10/2020    Future Appointments   Date Time Provider Department Center   09/02/2021  3:30 PM Mahala Menghini, RN [facility]       MOST RECENT BLOOD PRESSURES  BP Readings from Last 3 Encounters:   05/04/21 124/65   03/06/21 124/80   02/17/21 129/65         MOST RECENT LAB DATA  Lab Results   Component Value Date/Time    Creatinine 0.90 02/17/2021 12:41 PM    Potassium 3.9 02/17/2021 12:41 PM    ALT (SGPT) 15 02/17/2021 12:41 PM    TSH 3.17 07/22/2020 08:21 AM    Cholesterol, total 107 (L) 08/08/2020 11:00 AM    HGB 12.6 (L) 02/17/2021 12:41 PM    HCT 38.4 (L) 02/17/2021 12:41 PM    Vitamin B12 735 07/22/2020 08:21 AM    Hemoglobin A1c 8.6 (H) 07/22/2020 08:21 AM    Hemoglobin A1c (POC) 6.5 05/04/2021 12:40 PM

## 2021-07-15 NOTE — Telephone Encounter (Signed)
Done

## 2021-08-06 NOTE — Telephone Encounter (Signed)
I did not received [facility] or the [facility] after my last visit with Chrissie Noa. I called Ottie Glazier this morning requesting these to be faxed to me.   Mahala Menghini, RN

## 2021-08-27 NOTE — Telephone Encounter (Signed)
Pt is at [place], his niece requested we change the 09/02/21 at 3:30 appointment to a virtual he can have right at [place].    I changed the appt to virtual    Iselin email for the virtual link is    Engineer, [email or web address]    His niece would like to be zoomed in as well.    Her email is [email or web address]

## 2021-09-02 ENCOUNTER — Encounter: Primary: Registered Nurse

## 2021-09-03 NOTE — Telephone Encounter (Signed)
Alejandro [name]  January 15, 1941      Call back needed: No    Preferred call back number: Home phone  [phone] (home)    Telephone Information:   Mobile [phone]        Medications Requested:  Requested Prescriptions     Pending Prescriptions Disp Refills    acetaminophen (TYLENOL) 325 MG tablet 120 tablet      Sig: Take 2 tablets by mouth every 4 hours as needed   Prodigy test strips were also requested, unable to pull from current med list.    Preferred Pharmacy:   [facility] - [place], [place] - [address] - [place] [phone] [name] [phone]  [address]  [place] [address]  Phone: [phone] Fax: [phone]

## 2021-09-04 ENCOUNTER — Encounter: Payer: Medicare HMO | Attending: Physician Assistant | Admitting: Physician Assistant

## 2021-09-04 ENCOUNTER — Other Ambulatory Visit
Admission: RE | Admit: 2021-09-04 | Discharge: 2021-09-04 | Disposition: A | Discharge status: Home / Self Care | Payer: Medicare HMO | Source: Ambulatory Visit | Attending: Physician Assistant | Admitting: Physician Assistant

## 2021-09-04 ENCOUNTER — Other Ambulatory Visit: Payer: Self-pay

## 2021-09-04 DIAGNOSIS — B999 Unspecified infectious disease: Secondary | ICD-10-CM | POA: Diagnosis present

## 2021-09-04 MED ORDER — ACETAMINOPHEN 325 MG PO TABS
325 MG | ORAL_TABLET | ORAL | 0 refills | Status: DC | PRN
Start: 2021-09-04 — End: 2023-04-12

## 2021-09-04 NOTE — Telephone Encounter (Signed)
Medications Requested:  Requested Prescriptions     Pending Prescriptions Disp Refills    acetaminophen (TYLENOL) 325 MG tablet 120 tablet 0     Sig: Take 2 tablets by mouth every 4 hours as needed for Pain or Fever     "Medication: Acetaminophen  Last Office Visit: 1 year  Lab Monitoring: None  Category: Analgesic  Length of refill: 1 year  Brand Name: No  Comments:"      Preferred Pharmacy:   [facility] [place], [place] - [address] - [place] [phone] [name] [phone]  [address]  [place] [address]  Phone: [phone] Fax: [phone]      Date of Last Refill:     Prescription Refill Protocol reviewed:      Allergy List Reviewed and Verified:     Possible medication to medication interactions reviewed:     Last appt @ PCP Office: @LASTVISITTHISPROVIDER @    Future Appointments   Date Time Provider Department Center   10/15/2021  8:00 AM 14/06/2021, RN [facility]       MOST RECENT BLOOD PRESSURES  BP Readings from Last 3 Encounters:   05/04/21 124/65   03/06/21 124/80   09/25/20 130/64         MOST RECENT LAB DATA  Lab Results   Component Value Date/Time    K 3.9 02/17/2021 12:41 PM    ALT 15 02/17/2021 12:41 PM    TSH 3.17 07/22/2020 08:21 AM    CHOL 107 08/08/2020 11:00 AM    HGB 12.6 02/17/2021 12:41 PM    HCT 38.4 02/17/2021 12:41 PM    HBA1CPOC 6.5 05/04/2021 12:40 PM

## 2021-09-07 LAB — AEROBIC CULTURE W GRAM STAIN (SUPERFICIAL SPECIMEN)

## 2021-09-08 NOTE — Progress Notes (Signed)
ZANDYR, BARNHILL ([id number]) Visit Report for 09/04/2021 Allergy List Details Patient Name: Aaron Cain, Aaron Cain. Date of Service: 09/04/2021 8:45 AM Medical Record Number: [id number] Patient Account Number: [id number] Date of Birth/Sex: [birth year] (80 y.o. M) Treating RN: Yevonne Pax Primary Care Rome Schlauch: Karlene Einstein Other Clinician: Referring Caylen Kuwahara: Karlene Einstein Treating Ambrielle Kington/Extender: Rowan Blase in Treatment: 0 Allergies Active Allergies penicillin Sulfa (Sulfonamide Antibiotics) Allergy Notes Electronic Signature(s) Signed: 09/07/2021 6:56:11 PM By: Yevonne Pax RN Entered By: Yevonne Pax on 09/04/2021 09:05:41 Stults, Anselm Pancoast ([id number]) -------------------------------------------------------------------------------- Arrival Information Details Patient Name: Aaron Cain. Date of Service: 09/04/2021 8:45 AM Medical Record Number: [id number] Patient Account Number: [id number] Date of Birth/Sex: 1941-10-19 (80 y.o. M) Treating RN: Yevonne Pax Primary Care Iam Lipson: Karlene Einstein Other Clinician: Referring Eudelia Hiltunen: Karlene Einstein Treating Fred Hammes/Extender: Rowan Blase in Treatment: 0 Visit Information Patient Arrived: Wheel Chair Arrival Time: 09:02 Accompanied By: caregiver Transfer Assistance: None Patient Identification Verified: Yes Secondary Verification Process Completed: Yes Patient Requires Transmission-Based Precautions: No Patient Has Alerts: No Electronic Signature(s) Signed: 09/07/2021 6:56:11 PM By: Yevonne Pax RN Entered By: Yevonne Pax on 09/04/2021 09:02:45 Saravia, Anselm Pancoast ([id number]) -------------------------------------------------------------------------------- Multi Wound Chart Details Patient Name: Aaron Cain. Date of Service: 09/04/2021 8:45 AM Medical Record Number: [id number] Patient Account Number: [id number] Date of Birth/Sex: [birth year] (80 y.o. M) Treating RN: Yevonne Pax Primary Care Jaylenn Baiza: Karlene Einstein Other Clinician: Referring Chey Rachels: Karlene Einstein Treating Daniel Johndrow/Extender: Rowan Blase in Treatment: 0 Vital Signs Height(in): 65 Pulse(bpm): 108 Weight(lbs): 115 Blood Pressure(mmHg): 123/78 Body Mass Index(BMI): 19 Temperature(F): 98.4 Respiratory Rate(breaths/min): 18 Photos: Wound Location: Left Ilium Left Ischium Coccyx Wounding Event: Gradually Appeared Gradually Appeared Gradually Appeared Primary Etiology: Pressure Ulcer Pressure Ulcer Pressure Ulcer Comorbid History: Chronic Obstructive Pulmonary Chronic Obstructive Pulmonary Chronic Obstructive Pulmonary Disease (COPD) Disease (COPD) Disease (COPD) Date Acquired: 07/28/2021 07/28/2021 07/22/2021 Weeks of Treatment: 0 0 0 Wound Status: Open Open Open Pending Amputation on No No Yes Presentation: Measurements L x W x D (cm) 7x6.5x1.4 3x1.5x0.2 0.7x0.2x0.1 Area (cm) : 35.736 3.534 0.11 Volume (cm) : 50.03 0.707 0.011 % Reduction in Area: N/A 0.00% N/A % Reduction in Volume: N/A 0.00% N/A Starting Position 1 (o'clock): 12 Ending Position 1 (o'clock): 6 Maximum Distance 1 (cm): 2.5 Undermining: Yes No No 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 Exudate Amount: Medium Medium Medium Exudate Type: Serosanguineous Serosanguineous Serosanguineous Exudate Color: red, brown red, brown red, brown Granulation Amount: Medium (34-66%) Medium (34-66%) Small (1-33%) Granulation Quality: Red Red Pink Necrotic Amount: Medium (34-66%) Medium (34-66%) Large (67-100%) Necrotic Tissue: Eschar, Adherent Slough Adherent [employer] Exposed Structures: Fat Layer (Subcutaneous Tissue): Fat Layer (Subcutaneous Tissue): Fat Layer (Subcutaneous Tissue): Yes Yes Yes Fascia: No Fascia: No Fascia: No Tendon: No Tendon: No Tendon: No Muscle: No Muscle: No Muscle: No Joint: No Joint: No Joint: No Bone: No Bone: No Bone:  No Epithelialization: None None None Treatment Notes Electronic Signature(s) Signed: 09/07/2021 6:56:11 PM By: Yevonne Pax RN [address] ([id number]) Entered By: Yevonne Pax on 09/04/2021 09:51:58 Amsler, Anselm Pancoast ([id number]) -------------------------------------------------------------------------------- Multi-Disciplinary Care Plan Details Patient Name: Aaron Cain, Aaron Cain. Date of Service: 09/04/2021 8:45 AM Medical Record Number: [id number] Patient Account Number: [id number] Date of Birth/Sex: 1941/08/13 (80 y.o. M) Treating RN: Yevonne Pax Primary Care Nitara Szczerba: Karlene Einstein Other Clinician: Referring Norabelle Kondo: Karlene Einstein Treating Taaj Hurlbut/Extender: Rowan Blase in Treatment: 0 Active Inactive Abuse / Safety / Falls / Self Care Management Nursing Diagnoses: Potential for falls Goals: Patient will remain injury  free related to falls Date Initiated: 09/04/2021 Target Resolution Date: 10/05/2021 Goal Status: Active Interventions: Assess Activities of Daily Living upon admission and as needed Assess fall risk on admission and as needed Assess: immobility, friction, shearing, incontinence upon admission and as needed Assess impairment of mobility on admission and as needed per policy Assess personal safety and home safety (as indicated) on admission and as needed Assess self care needs on admission and as needed Notes: Wound/Skin Impairment Nursing Diagnoses: Knowledge deficit related to ulceration/compromised skin integrity Goals: Patient will have a decrease in wound volume by X% from date: (specify in notes) Date Initiated: 09/04/2021 Target Resolution Date: 10/05/2021 Goal Status: Active Patient/caregiver will verbalize understanding of skin care regimen Date Initiated: 09/04/2021 Target Resolution Date: 11/04/2021 Goal Status: Active Ulcer/skin breakdown will have a volume reduction of 30% by week 4 Date Initiated: 09/04/2021 Target  Resolution Date: 12/05/2021 Goal Status: Active Ulcer/skin breakdown will have a volume reduction of 50% by week 8 Date Initiated: 09/04/2021 Target Resolution Date: 01/05/2022 Goal Status: Active Ulcer/skin breakdown will have a volume reduction of 80% by week 12 Date Initiated: 09/04/2021 Target Resolution Date: 02/02/2022 Goal Status: Active Ulcer/skin breakdown will heal within 14 weeks Date Initiated: 09/04/2021 Target Resolution Date: 03/05/2022 Goal Status: Active Interventions: Assess patient/caregiver ability to obtain necessary supplies Assess patient/caregiver ability to perform ulcer/skin care regimen upon admission and as needed Assess ulceration(s) every visit Notes: Aaron Cain, Aaron Cain ([id number]) Electronic Signature(s) Signed: 09/07/2021 6:56:11 PM By: Yevonne Pax RN Entered By: Yevonne Pax on 09/04/2021 09:51:42 Ciolek, Anselm Pancoast ([id number]) -------------------------------------------------------------------------------- Pain Assessment Details Patient Name: Aaron Cain. Date of Service: 09/04/2021 8:45 AM Medical Record Number: [id number] Patient Account Number: [id number] Date of Birth/Sex: Mar 15, 1941 (80 y.o. M) Treating RN: Yevonne Pax Primary Care Talecia Sherlin: Karlene Einstein Other Clinician: Referring Sadiyah Kangas: Karlene Einstein Treating Natsuko Kelsay/Extender: Rowan Blase in Treatment: 0 Active Problems Location of Pain Severity and Description of Pain Patient Has Paino No Site Locations Pain Management and Medication Current Pain Management: Electronic Signature(s) Signed: 09/07/2021 6:56:11 PM By: Yevonne Pax RN Entered By: Yevonne Pax on 09/04/2021 09:02:58 Elgin, Anselm Pancoast ([id number]) -------------------------------------------------------------------------------- Wound Assessment Details Patient Name: Aaron Cain. Date of Service: 09/04/2021 8:45 AM Medical Record Number: [id number] Patient Account Number:  [id number] Date of Birth/Sex: [birth year] (80 y.o. M) Treating RN: Yevonne Pax Primary Care Coty Larsh: Karlene Einstein Other Clinician: Referring Travanti Mcmanus: Karlene Einstein Treating Taya Ashbaugh/Extender: Rowan Blase in Treatment: 0 Wound Status Wound Number: 1 Primary Etiology: Pressure Ulcer Wound Location: Left Ilium Wound Status: Open Wounding Event: Gradually Appeared Comorbid History: Chronic Obstructive Pulmonary Disease (COPD) Date Acquired: 07/28/2021 Weeks Of Treatment: 0 Clustered Wound: No Photos Wound Measurements Length: (cm) 7 Width: (cm) 6.5 Depth: (cm) 1.4 Area: (cm) 35.736 Volume: (cm) 50.03 % Reduction in Area: % Reduction in Volume: Epithelialization: None Tunneling: No Undermining: Yes Starting Position (o'clock): 12 Ending Position (o'clock): 6 Maximum Distance: (cm) 2.5 Wound Description Classification: Category/Stage IV Exudate Amount: Medium Exudate Type: Serosanguineous Exudate Color: red, brown Foul Odor After Cleansing: No Slough/Fibrino Yes Wound Bed Granulation Amount: Medium (34-66%) Exposed Structure Granulation Quality: Red Fascia Exposed: No Necrotic Amount: Medium (34-66%) Fat Layer (Subcutaneous Tissue) Exposed: Yes Necrotic Quality: Eschar, Adherent Slough Tendon Exposed: No Muscle Exposed: No Joint Exposed: No Bone Exposed: No Electronic Signature(s) Signed: 09/07/2021 6:56:11 PM By: Yevonne Pax RN Entered By: Yevonne Pax on 09/04/2021 09:27:07 Fusselman, Anselm Pancoast ([id number]) -------------------------------------------------------------------------------- Wound Assessment Details Patient Name: Aaron Cain. Date of Service: 09/04/2021 8:45 AM Medical Record  Number: [id number] Patient Account Number: [id number] Date of Birth/Sex: 01/03/1941 (80 y.o. M) Treating RN: Yevonne Pax Primary Care Alessia Gonsalez: Karlene Einstein Other Clinician: Referring Zai Chmiel: Karlene Einstein Treating Alaylah Heatherington/Extender: Rowan Blase in Treatment: 0 Wound Status Wound Number: 2 Primary Etiology: Pressure Ulcer Wound Location: Left Ischium Wound Status: Open Wounding Event: Gradually Appeared Comorbid History: Chronic Obstructive Pulmonary Disease (COPD) Date Acquired: 07/28/2021 Weeks Of Treatment: 0 Clustered Wound: No Photos Wound Measurements Length: (cm) 3 Width: (cm) 1.5 Depth: (cm) 0.2 Area: (cm) 3.534 Volume: (cm) 0.707 % Reduction in Area: 0% % Reduction in Volume: 0% Epithelialization: None Tunneling: No Undermining: No Wound Description Classification: Category/Stage III Exudate Amount: Medium Exudate Type: Serosanguineous Exudate Color: red, brown Foul Odor After Cleansing: No Slough/Fibrino Yes Wound Bed Granulation Amount: Medium (34-66%) Exposed Structure Granulation Quality: Red Fascia Exposed: No Necrotic Amount: Medium (34-66%) Fat Layer (Subcutaneous Tissue) Exposed: Yes Necrotic Quality: Adherent Slough Tendon Exposed: No Muscle Exposed: No Joint Exposed: No Bone Exposed: No Electronic Signature(s) Signed: 09/04/2021 7:05:42 PM By: Lenda Kelp PA-C Signed: 09/07/2021 6:56:11 PM By: Yevonne Pax RN Entered By: Lenda Kelp on 09/04/2021 09:42:37 Rosenberg, Anselm Pancoast ([id number]) -------------------------------------------------------------------------------- Wound Assessment Details Patient Name: Aaron Cain. Date of Service: 09/04/2021 8:45 AM Medical Record Number: [id number] Patient Account Number: [id number] Date of Birth/Sex: 05/27/1941 (80 y.o. M) Treating RN: Yevonne Pax Primary Care Hades Mathew: Karlene Einstein Other Clinician: Referring Lorraina Spring: Karlene Einstein Treating Madora Barletta/Extender: Rowan Blase in Treatment: 0 Wound Status Wound Number: 3 Primary Etiology: Pressure Ulcer Wound Location: Coccyx Wound Status: Open Wounding Event: Gradually Appeared Comorbid History: Chronic Obstructive Pulmonary Disease (COPD) Date  Acquired: 07/22/2021 Weeks Of Treatment: 0 Clustered Wound: No Pending Amputation On Presentation Photos Wound Measurements Length: (cm) 0.7 Width: (cm) 0.2 Depth: (cm) 0.1 Area: (cm) 0.11 Volume: (cm) 0.011 % Reduction in Area: % Reduction in Volume: Epithelialization: None Tunneling: No Undermining: No Wound Description Classification: Category/Stage III Exudate Amount: Medium Exudate Type: Serosanguineous Exudate Color: red, brown Foul Odor After Cleansing: No Slough/Fibrino Yes Wound Bed Granulation Amount: Small (1-33%) Exposed Structure Granulation Quality: Pink Fascia Exposed: No Necrotic Amount: Large (67-100%) Fat Layer (Subcutaneous Tissue) Exposed: Yes Necrotic Quality: Adherent Slough Tendon Exposed: No Muscle Exposed: No Joint Exposed: No Bone Exposed: No Electronic Signature(s) Signed: 09/07/2021 6:56:11 PM By: Yevonne Pax RN Entered By: Yevonne Pax on 09/04/2021 09:31:49 Odonnell, Anselm Pancoast ([id number]) -------------------------------------------------------------------------------- Vitals Details Patient Name: Aaron Cain. Date of Service: 09/04/2021 8:45 AM Medical Record Number: [id number] Patient Account Number: [id number] Date of Birth/Sex: [birth year] (80 y.o. M) Treating RN: Yevonne Pax Primary Care Katheen Aslin: Karlene Einstein Other Clinician: Referring Jeilani Grupe: Karlene Einstein Treating Leondro Coryell/Extender: Rowan Blase in Treatment: 0 Vital Signs Time Taken: 09:03 Temperature (F): 98.4 Height (in): 65 Pulse (bpm): 108 Source: Stated Respiratory Rate (breaths/min): 18 Weight (lbs): 115 Blood Pressure (mmHg): 123/78 Source: Stated Reference Range: 80 - 120 mg / dl Body Mass Index (BMI): 19.1 Electronic Signature(s) Signed: 09/07/2021 6:56:11 PM By: Yevonne Pax RN Entered By: Yevonne Pax on 09/04/2021 09:04:37

## 2021-09-08 NOTE — Progress Notes (Addendum)
LACHARLES, ALTSCHULER ([id number]) Visit Report for 09/04/2021 Chief Complaint Document Details Patient Name: Aaron Cain, KAMAU. Date of Service: 09/04/2021 8:45 AM Medical Record Number: [id number] Patient Account Number: [id number] Date of Birth/Sex: 1941-03-29 (80 y.o. 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 Date of Service: 09/04/2021 8:45 AM Medical Record Number: [id number] Patient Account Number: [id number] Date of Birth/Sex: Dec 03, 1940 (80 y.o. M) Treating RN: Yevonne Pax Primary Care Provider: Karlene Einstein Other Clinician: Referring Provider: Karlene Einstein Treating Provider/Extender: Rowan Blase in Treatment: 0 Debridement Performed for Wound #1 Left Ilium Assessment: Performed By: Physician Nelida Meuse., PA-C Debridement Type: Debridement Level of Consciousness (Pre- Awake and Alert procedure): Pre-procedure Verification/Time Out Yes - 09:55 Taken: Start Time: 09:55 Pain Control: Lidocaine 4% Topical Solution Total Area Debrided (L x W): 7 (cm) x 6.5 (cm) = 45.5 (cm) Tissue and other material Viable, Non-Viable, Eschar, Muscle, Slough, Subcutaneous, Skin: Dermis , Slough debrided: Level: Skin/Subcutaneous Tissue/Muscle Debridement Description: Excisional Instrument: Forceps, Scissors Bleeding: Minimum Hemostasis Achieved: Pressure End Time: 10:00 Procedural Pain: 0 Post Procedural Pain:  0 Response to Treatment: Procedure was tolerated well Level of Consciousness (Post- Awake and Alert procedure): Post Debridement Measurements of Total Wound Length: (cm) 7 Stage: Category/Stage IV Width: (cm) 6.5 Depth: (cm) 1.4 Volume: (cm) 50.03 Character of Wound/Ulcer Post Debridement: Improved Post Procedure Diagnosis Same as Pre-procedure Electronic Signature(s) Signed: 09/04/2021 7:05:42 PM By: Lenda Kelp PA-C Signed: 09/07/2021 6:56:11 PM By: Yevonne Pax RN Entered By: Yevonne Pax on 09/04/2021 09:59:56 Rastetter, Anselm Pancoast ([id number]) -------------------------------------------------------------------------------- HPI Details Patient Name: Aaron Cain. Date of Service: 09/04/2021 8:45 AM Medical Record Number: [id number] Patient Account Number: [id number] Date of Birth/Sex: 1941/08/14 (80 y.o. M) Treating RN: Yevonne Pax Primary Care Provider: Karlene Einstein Other Clinician: Referring Provider: Karlene Einstein Treating Provider/Extender: Rowan Blase in Treatment: 0 History of Present Illness HPI Description: 09/04/2021 upon evaluation today patient appears to be doing somewhat poorly in regard to wounds that he has over the left ilium, left ischium, and coccyx region. The ileum location is actually the worst being a stage IV that has a significant amount of necrotic tissue MIF to perform some sharp debridement to remove this today. In regard to the other 2 today her stage III pressure ulcer is not nearly as deep nor as bad but nonetheless do need to be addressed as far as dressings are concerned. I also think the patient is probably can need antibiotic with regard to what I am seeing in the ileum location. I also think that the patient probably could benefit from Dakin's moistened gauze dressings in this area as well. 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 He is very pleasant however  and has a ton of information that he loves sharing and discussing during the visit today. He seems to inherently enjoy seeing new people and talking. Electronic Signature(s) Signed: 09/04/2021 6:59:45 PM By: Lenda Kelp PA-C Entered By: Lenda Kelp on 09/04/2021 18:59:44 Thornberry, Anselm Pancoast ([id number]) -------------------------------------------------------------------------------- Physical Exam Details Patient Name: Aaron Cain, SCHAKE. Date of Service: 09/04/2021 8:45 AM Medical Record Number:  [id number] Patient Account Number: [id number] Date of Birth/Sex: 1941-06-25 (80 y.o. M) Treating RN: Yevonne Pax Primary Care Provider: Karlene Einstein Other Clinician: Referring Provider: Karlene Einstein Treating Provider/Extender: Rowan Blase in Treatment: 0 Constitutional sitting or standing blood pressure is within target range for patient.. pulse regular and within target range for patient.Marland Kitchen respirations regular, non- labored and within target range for patient.Marland Kitchen temperature within target range for patient.. Well-nourished and well-hydrated in no acute distress. Eyes conjunctiva clear no eyelid edema noted. pupils equal round and reactive to light and accommodation. Ears, Nose, Mouth, and Throat no gross abnormality of ear auricles or external auditory canals. normal hearing noted during conversation. mucus membranes moist. Respiratory normal breathing without difficulty. Musculoskeletal Patient unable to walk without assistance. Psychiatric this patient is able to make decisions and demonstrates good insight into disease process. Alert and Oriented x 3. pleasant and cooperative. Notes Upon inspection patient's wounds in regard to the left ilium did require sharp debridement to clear away some of the necrotic debris here. He tolerated that without any pain he did not feel anything when I was cleaning off this region that was good news. Nonetheless this is the spot where  I think regular need to see about doing something like the Dakin's moistened gauze packing which I think is good to be a much better way to go in general for the patient. Nonetheless the other 2 wounds I did not perform any debridement on right now although I think silver alginate will be a good option here for these locations. Electronic Signature(s) Signed: 09/04/2021 7:00:34 PM By: Lenda Kelp PA-C Entered By: Lenda Kelp on 09/04/2021 19:00:34 Covello, Anselm Pancoast ([id number]) -------------------------------------------------------------------------------- Physician Orders Details Patient Name: ERYCK, NEGRON. Date of Service: 09/04/2021 8:45 AM Medical Record Number: [id number] Patient Account Number: [id number] Date of Birth/Sex: October 13, 1941 (80 y.o. 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 Home Health o [facility] for wound care. May utilize formulary equivalent dressing for wound treatment orders unless otherwise specified. Home Health Nurse may visit PRN to address patientos wound care needs. - [facility] Wound Treatment Wound #1 - Ilium Wound Laterality: Left Cleanser: Wound Cleanser 1 x Per Day/30 Days Discharge Instructions: Wash your hands with soap and water. Remove old dressing, discard into plastic bag and place into trash. Cleanse the wound with Wound Cleanser prior to applying a clean dressing using gauze sponges, not  tissues or cotton balls. Do not scrub or use excessive force. Pat dry using gauze sponges, not tissue or cotton balls. Primary Dressing: Gauze 1 x Per Day/30 Days Discharge Instructions: moistened with Dakins Solution Secondary Dressing: ABD Pad 5x9 (in/in) 1 x Per Day/30 Days Discharge Instructions: Cover with ABD pad Secured With: 2M Medipore H Soft Cloth Surgical Tape, 2x2 (in/yd) 1 x Per Day/30 Days Wound #2 - Ischium Wound Laterality: Left Cleanser: Wound Cleanser 3 x Per Week/30 Days Discharge Instructions: Wash your hands with soap and water. Remove old dressing, discard into plastic bag and place into trash. Cleanse the wound with Wound Cleanser prior to applying a  clean dressing using gauze sponges, not tissues or cotton balls. Do not scrub or use excessive force. Pat dry using gauze sponges, not tissue or cotton balls. Primary Dressing: SILVERCEL Antimicrobial Alginate Dressing, 1x12 (in/in) 3 x Per Week/30 Days Secondary Dressing: Zetuvit Plus Silicone Border Dressing 4x4 (in/in) 3 x Per Week/30 Days Wound #3 - Coccyx Cleanser: Wound Cleanser 3 x Per Week/30 Days Discharge Instructions: Wash your hands with soap and water. Remove old dressing, discard into plastic bag and place into trash. Cleanse the wound with Wound Cleanser prior to applying a clean dressing using gauze sponges, not tissues or cotton balls. Do not scrub or use excessive force. Pat dry using gauze sponges, not tissue or cotton balls. 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 Do not take CertaVite or Ethelda Chick + D3 within 2 hours of Doxycycline Dakin's Solution 09/04/2021 DOSE miscellaneous 0.25 % solution - Moisten gauze with Dakin's solution then wring out leaving gauze damp not saturated before packing daily into the wound as directed in clini Electronic Signature(s) Signed: 09/08/2021 10:56:23 AM By: Yevonne Pax RN Signed: 09/09/2021 6:58:05 PM By: Lenda Kelp PA-C Previous Signature: 09/04/2021 7:04:50 PM Version By: Lenda Kelp PA-C Previous Signature: 09/04/2021 1:46:58 PM Version By: Yevonne Pax RN Entered By: Yevonne Pax on 09/08/2021 10:56:21 Harty, Anselm Pancoast ([id number]) -------------------------------------------------------------------------------- Problem List Details Patient Name: KAMARIUS, BUCKBEE. Date of Service: 09/04/2021 8:45 AM Medical Record Number: [id number] Patient Account Number: [id number] Date of Birth/Sex: 1941-09-28 (80 y.o. 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 Date of Service: 09/04/2021 8:45 AM Medical Record Number: [id number] Patient Account Number: [id number] Date of Birth/Sex: 06-26-1941 (80 y.o. 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 The ileum location is actually the worst being a stage IV that has a significant amount of necrotic tissue MIF to perform some sharp debridement to remove this today. In regard to the  other 2 today her stage III pressure ulcer is not nearly as deep nor as bad but nonetheless do need to be addressed as far as dressings are concerned. I also think the patient is probably can need antibiotic with regard to what I am seeing in the ileum location. I also think that the patient probably could benefit from Dakin's moistened gauze dressings in this area as well. 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 He is very pleasant however and has a ton of information that he loves sharing and discussing during the visit today. He seems to inherently enjoy seeing new people and talking. Patient History Allergies penicillin, Sulfa (Sulfonamide Antibiotics) Social History Former smoker, Marital Status - Single, Alcohol Use - Never, Drug Use - Current History, Caffeine Use - Daily. Medical History Respiratory Patient has history of Chronic Obstructive Pulmonary  Disease (COPD) Review of Systems (ROS) Integumentary (Skin) Complains or has symptoms of Wounds. Objective Constitutional sitting or standing blood pressure is within target range for patient.. pulse regular and within target range for patient.Marland Kitchen respirations regular, non- labored and within target range for patient.Marland Kitchen temperature within target range for patient.. Well-nourished and well-hydrated in no acute distress. Vitals Time Taken: 9:03 AM, Height: 65 in, Source: Stated, Weight: 115 lbs, Source: Stated, BMI: 19.1, Temperature: 98.4 F, Pulse: 108 bpm, Respiratory Rate: 18 breaths/min, Blood Pressure: 123/78 mmHg. Eyes conjunctiva clear no eyelid edema noted. pupils equal round and reactive to light and accommodation. Ears, Nose, Mouth, and Throat no gross abnormality of ear auricles or external auditory canals. normal hearing noted during conversation. mucus membranes moist. Respiratory normal breathing without difficulty. Musculoskeletal Patient unable to walk without assistance. Psychiatric Aaron Cain, NYLAN NEVEL ([id number]) this patient is able to make decisions and demonstrates good insight into disease process. Alert and Oriented x 3. pleasant and cooperative. General Notes: Upon inspection patient's wounds in regard to the left ilium did require sharp debridement to clear away some of the necrotic debris here. He tolerated that without any pain he did not feel anything when I was cleaning off this region that was good news. Nonetheless this is the spot where I think regular need to see about doing something like the Dakin's moistened gauze packing which I think is good to be a much better way to go in general for the patient. Nonetheless the other 2 wounds I did not perform any debridement on right now although I think silver alginate will be a good option here for these locations. Integumentary (Hair, Skin) Wound #1 status is Open. Original cause of wound was Gradually Appeared.  The date acquired was: 07/28/2021. The wound is located on the Left Ilium. The wound measures 7cm length x 6.5cm width x 1.4cm depth; 35.736cm^2 area and 50.03cm^3 volume. There is Fat Layer (Subcutaneous Tissue) exposed. There is no tunneling noted, however, there is undermining starting at 12:00 and ending at 6:00 with a maximum distance of 2.5cm. There is a medium amount of serosanguineous drainage noted. There is medium (34-66%) red granulation within the wound bed. There is a medium (34-66%) amount of necrotic tissue within the wound bed including Eschar and Adherent Slough. Wound #2 status is Open. Original cause of wound was Gradually Appeared. The date acquired was: 07/28/2021. The wound is located on the Left Ischium. The wound measures 3cm length x 1.5cm width x 0.2cm depth; 3.534cm^2 area and 0.707cm^3 volume. There is Fat Layer (Subcutaneous Tissue) exposed. There is no tunneling or undermining  noted. There is a medium amount of serosanguineous drainage noted. There is medium (34-66%) red granulation within the wound bed. There is a medium (34-66%) amount of necrotic tissue within the wound bed including Adherent Slough. Wound #3 status is Open. Original cause of wound was Gradually Appeared. The date acquired was: 07/22/2021. The wound is located on the Coccyx. The wound measures 0.7cm length x 0.2cm width x 0.1cm depth; 0.11cm^2 area and 0.011cm^3 volume. There is Fat Layer (Subcutaneous Tissue) exposed. There is no tunneling or undermining noted. There is a medium amount of serosanguineous drainage noted. There is small (1-33%) pink granulation within the wound bed. There is a large (67-100%) amount of necrotic tissue within the wound bed including Adherent Slough. Assessment Active Problems ICD-10 Pressure ulcer of left buttock, stage 4 Pressure ulcer of sacral region, stage 3 Pressure ulcer of other site, stage 3 Contracture of muscle, multiple sites Muscle weakness  (generalized) Other schizophrenia Procedures Wound #1 Pre-procedure diagnosis of Wound #1 is a Pressure Ulcer located on the Left Ilium . There was a Excisional Skin/Subcutaneous Tissue/Muscle Debridement with a total area of 45.5 sq cm performed by Nelida Meuse., PA-C. With the following instrument(s): Forceps, and Scissors to remove Viable and Non-Viable tissue/material. Material removed includes Muscle, Eschar, Subcutaneous Tissue, Slough, and Skin: Dermis after achieving pain control using Lidocaine 4% Topical Solution. No specimens were taken. A time out was conducted at 09:55, prior to the start of the procedure. A Minimum amount of bleeding was controlled with Pressure. The procedure was tolerated well with a pain level of 0 throughout and a pain level of 0 following the procedure. Post Debridement Measurements: 7cm length x 6.5cm width x 1.4cm depth; 50.03cm^3 volume. Post debridement Stage noted as Category/Stage IV. Character of Wound/Ulcer Post Debridement is improved. Post procedure Diagnosis Wound #1: Same as Pre-Procedure Plan Follow-up Appointments: Return Appointment in 2 weeks. Home Health: [facility] for wound care. May utilize formulary equivalent dressing for wound treatment orders unless otherwise specified. Home Health Nurse may visit PRN to address patient s wound care needs. - [facility] The following medication(s) was prescribed: doxycycline hyclate oral 100 mg capsule 1 1 capsule oral taken 2 time per day for 14 days. Do not SAAGAR, LUTZ ([id number]) take CertaVite or Ethelda Chick + D3 within 2 hours of Doxycycline starting 09/04/2021 Dakin's Solution miscellaneous 0.25 % solution Moisten gauze with Dakin's solution then wring out leaving gauze damp not saturated before packing daily into the wound as directed in clini starting 09/04/2021 WOUND #1: - Ilium Wound Laterality: Left Cleanser: Wound Cleanser 1 x Per Day/30 Days Discharge Instructions:  Wash your hands with soap and water. Remove old dressing, discard into plastic bag and place into trash. Cleanse the wound with Wound Cleanser prior to applying a clean dressing using gauze sponges, not tissues or cotton balls. Do not scrub or use excessive force. Pat dry using gauze sponges, not tissue or cotton balls. Primary Dressing: Gauze 1 x Per Day/30 Days Discharge Instructions: moistened with Dakins Solution Secondary Dressing: ABD Pad 5x9 (in/in) 1 x Per Day/30 Days Discharge Instructions: Cover with ABD pad Secured With: 2M Medipore H Soft Cloth Surgical Tape, 2x2 (in/yd) 1 x Per Day/30 Days WOUND #2: - Ischium Wound Laterality: Left Cleanser: Wound Cleanser 3 x Per Week/30 Days Discharge Instructions: Wash your hands with soap and water. Remove old dressing, discard into plastic bag and place into trash. Cleanse the wound with Wound Cleanser prior to applying a clean dressing  using gauze sponges, not tissues or cotton balls. Do not scrub or use excessive force. Pat dry using gauze sponges, not tissue or cotton balls. Primary Dressing: SILVERCEL Antimicrobial Alginate Dressing, 1x12 (in/in) 3 x Per Week/30 Days Secondary Dressing: Zetuvit Plus Silicone Border Dressing 4x4 (in/in) 3 x Per Week/30 Days WOUND #3: - Coccyx Wound Laterality: Cleanser: Wound Cleanser 3 x Per Week/30 Days Discharge Instructions: Wash your hands with soap and water. Remove old dressing, discard into plastic bag and place into trash. Cleanse the wound with Wound Cleanser prior to applying a clean dressing using gauze sponges, not tissues or cotton balls. Do not scrub or use excessive force. Pat dry using gauze sponges, not tissue or cotton balls. Primary Dressing: SILVERCEL Antimicrobial Alginate Dressing, 1x12 (in/in) 3 x Per Week/30 Days Secondary Dressing: Zetuvit Plus Silicone Border Dressing 4x4 (in/in) 3 x Per Week/30 Days 1. Based on what I am seeing I would go ahead and send in a prescription for  the Dakin's solution which I think will be helpful for him. 2. I am also going to recommend that we go ahead and send in a prescription for an antibiotic. He is allergic to penicillin and sulfa medications. For that reason his best bet is probably good to be for [place] to utilize such as doxycycline which I think would be a good option. It also is not something that inherently tends to cause a lot of of allergic reaction. 3. I am also can recommend aggressive and appropriate offloading I discussed that with the caregiver today they are going to try to stand him up every couple of hours or so for a few minutes he can stand for a little while and give him a chance to let blood flow through the gluteal region so hopefully things do not breakdown more and causing more significant issues. I think this can be very helpful. We will see patient back for reevaluation in 2 weeks here in the clinic. If anything worsens or changes patient will contact our office for additional recommendations. Electronic Signature(s) Signed: 09/04/2021 7:05:02 PM By: Lenda Kelp PA-C Entered By: Lenda Kelp on 09/04/2021 19:05:02 Felipe, Anselm Pancoast ([id number]) -------------------------------------------------------------------------------- ROS/PFSH Details Patient Name: Aaron Cain, Aaron Cain. Date of Service: 09/04/2021 8:45 AM Medical Record Number: [id number] Patient Account Number: [id number] Date of Birth/Sex: September 02, 1941 (80 y.o. M) Treating RN: Yevonne Pax Primary Care Provider: Karlene Einstein Other Clinician: Referring Provider: Karlene Einstein Treating Provider/Extender: Rowan Blase in Treatment: 0 Integumentary (Skin) Complaints and Symptoms: Positive for: Wounds Respiratory Medical History: Positive for: Chronic Obstructive Pulmonary Disease (COPD) Immunizations Pneumococcal Vaccine: Received Pneumococcal Vaccination: Yes Received Pneumococcal Vaccination On or After 60th Birthday:  Yes Implantable Devices None Family and Social History Former smoker; Marital Status - Single; Alcohol Use: Never; Drug Use: Current History; Caffeine Use: Daily; Financial Concerns: No; Food, Clothing or Shelter Needs: No; Support System Lacking: No; Transportation Concerns: No Electronic Signature(s) Signed: 09/04/2021 7:05:42 PM By: Lenda Kelp PA-C Signed: 09/07/2021 6:56:11 PM By: Yevonne Pax RN Entered By: Yevonne Pax on 09/04/2021 09:08:05 Ibrahim, Anselm Pancoast ([id number]) -------------------------------------------------------------------------------- SuperBill Details Patient Name: Aaron Cain. Date of Service: 09/04/2021 Medical Record Number: [id number] Patient Account Number: [id number] Date of Birth/Sex: 1941-04-19 (80 y.o. 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, multiple sites M62.81 Muscle weakness (generalized) F20.89 Other schizophrenia Facility Procedures CPT4 Code: 89373428 Description: 11043 - DEB MUSC/FASCIA 20 SQ CM/< Modifier: Quantity: 1 CPT4 Code: Description: ICD-10 Diagnosis Description 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, stage 4 Modifier: Quantity: CPT4 Code: 76811572 Description: 11046 - DEB MUSC/FASCIA EA ADDL 20 CM Modifier: Quantity: 2 CPT4 Code: Description: ICD-10 Diagnosis Description 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, stage 4 Modifier: Quantity: Physician Procedures CPT4 Code: 6203559 Description: 99204 - WC PHYS LEVEL 4 - NEW PT Modifier: 25 Quantity: 1 CPT4 Code: Description: ICD-10 Diagnosis Description 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,  stage 4 L89.153 Pressure ulcer of sacral region, 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, 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

## 2021-09-08 NOTE — Progress Notes (Signed)
LARAMIE, MEISSNER ([id number]) Visit Report for 09/04/2021 Abuse/Suicide Risk Screen Details Patient Name: Aaron Cain, Aaron Cain. Date of Service: 09/04/2021 8:45 AM Medical Record Number: [id number] Patient Account Number: [id number] Date of Birth/Sex: Mar 31, 1941 (80 y.o. M) Treating RN: Yevonne Pax Primary Care Stamatia Masri: Karlene Einstein Other Clinician: Referring Keri Tavella: Karlene Einstein Treating Quency Tober/Extender: Rowan Blase in Treatment: 0 Abuse/Suicide Risk Screen Items Answer ABUSE RISK SCREEN: Has anyone close to you tried to hurt or harm you recentlyo No Do you feel uncomfortable with anyone in your familyo No Has anyone forced you do things that you didnot want to doo No Electronic Signature(s) Signed: 09/07/2021 6:56:11 PM By: Yevonne Pax RN Entered By: Yevonne Pax on 09/04/2021 09:08:11 Strohm, Anselm Pancoast ([id number]) -------------------------------------------------------------------------------- Activities of Daily Living Details Patient Name: Aaron Cain, Aaron Cain. Date of Service: 09/04/2021 8:45 AM Medical Record Number: [id number] Patient Account Number: [id number] Date of Birth/Sex: 22-Dec-1940 (80 y.o. M) Treating RN: Yevonne Pax Primary Care Heavyn Yearsley: Karlene Einstein Other Clinician: Referring Zaiden Ludlum: Karlene Einstein Treating Cincere Zorn/Extender: Rowan Blase in Treatment: 0 Activities of Daily Living Items Answer Activities of Daily Living (Please select one for each item) Drive Automobile Not Able Take Medications Not Able Use Telephone Not Able Care for Appearance Not Able Use Toilet Need Assistance Bath / Shower Need Assistance Dress Self Need Assistance Feed Self Need Assistance Walk Not Able Get In / Out Bed Need Assistance Housework Not Able Prepare Meals Not Able Handle Money Not Able Shop for Self Not Able Electronic Signature(s) Signed: 09/07/2021 6:56:11 PM By: Yevonne Pax RN Entered By: Yevonne Pax on 09/04/2021  09:09:11 Harold, Anselm Pancoast ([id number]) -------------------------------------------------------------------------------- Education Screening Details Patient Name: Aaron Cain. Date of Service: 09/04/2021 8:45 AM Medical Record Number: [id number] Patient Account Number: [id number] Date of Birth/Sex: 31-May-1941 (80 y.o. M) Treating RN: Yevonne Pax Primary Care Bernetha Anschutz: Karlene Einstein Other Clinician: Referring Yerick Eggebrecht: Karlene Einstein Treating Grayton Lobo/Extender: Rowan Blase in Treatment: 0 Primary Learner Assessed: Patient Learning Preferences/Education Level/Primary Language Learning Preference: Explanation Highest Education Level: College or Above Preferred Language: English Cognitive Barrier Language Barrier: No Translator Needed: No Memory Deficit: No Emotional Barrier: No Cultural/Religious Beliefs Affecting Medical Care: No Physical Barrier Impaired Vision: Yes Glasses Impaired Hearing: No Decreased Hand dexterity: No Knowledge/Comprehension Knowledge Level: Low Comprehension Level: Medium Ability to understand written instructions: Medium Ability to understand verbal instructions: Medium Motivation Anxiety Level: Anxious Cooperation: Cooperative Education Importance: Acknowledges Need Interest in Health Problems: Asks Questions Perception: Coherent Willingness to Engage in Self-Management Medium Activities: Readiness to Engage in Self-Management Medium Activities: Electronic Signature(s) Signed: 09/07/2021 6:56:11 PM By: Yevonne Pax RN Entered By: Yevonne Pax on 09/04/2021 09:09:43 Cheadle, Anselm Pancoast ([id number]) -------------------------------------------------------------------------------- Fall Risk Assessment Details Patient Name: Aaron Cain. Date of Service: 09/04/2021 8:45 AM Medical Record Number: [id number] Patient Account Number: [id number] Date of Birth/Sex: [birth year] (80 y.o. M) Treating RN: Yevonne Pax Primary Care  Sara Keys: Karlene Einstein Other Clinician: Referring Giavonni Cizek: Karlene Einstein Treating Rogene Meth/Extender: Rowan Blase in Treatment: 0 Fall Risk Assessment Items Have you had 2 or more falls in the last 12 monthso 0 No Have you had any fall that resulted in injury in the last 12 monthso 0 No FALLS RISK SCREEN History of falling - immediate or within 3 months 0 No Secondary diagnosis (Do you have 2 or more medical diagnoseso) 0 No Ambulatory aid None/bed rest/wheelchair/nurse 0 No Crutches/cane/walker 0 No Furniture 0 No Intravenous therapy Access/Saline/Heparin Lock 0 No Gait/Transferring Normal/ bed rest/ wheelchair 0 No Weak (short steps  with or without shuffle, stooped but able to lift head while walking, may 0 No seek support from furniture) Impaired (short steps with shuffle, may have difficulty arising from chair, head down, impaired 0 No balance) Mental Status Oriented to own ability 0 No Electronic Signature(s) Signed: 09/07/2021 6:56:11 PM By: Yevonne Pax RN Entered By: Yevonne Pax on 09/04/2021 09:09:51 Shives, Anselm Pancoast ([id number]) -------------------------------------------------------------------------------- Foot Assessment Details Patient Name: Aaron Cain. Date of Service: 09/04/2021 8:45 AM Medical Record Number: [id number] Patient Account Number: [id number] Date of Birth/Sex: 04/04/1941 (80 y.o. M) Treating RN: Yevonne Pax Primary Care Spiro Ausborn: Karlene Einstein Other Clinician: Referring Kinslei Labine: Karlene Einstein Treating Samvel Zinn/Extender: Rowan Blase in Treatment: 0 Foot Assessment Items Site Locations + = Sensation present, - = Sensation absent, C = Callus, U = Ulcer R = Redness, W = Warmth, M = Maceration, PU = Pre-ulcerative lesion F = Fissure, S = Swelling, D = Dryness Assessment Right: Left: Other Deformity: No No Prior Foot Ulcer: No No Prior Amputation: No No Charcot Joint: No No Ambulatory Status:  Non-ambulatory Assistance Device: Wheelchair Gait: Surveyor, mining) Signed: 09/07/2021 6:56:11 PM By: Yevonne Pax RN Entered By: Yevonne Pax on 09/04/2021 09:10:33 Cooter, Anselm Pancoast ([id number]) -------------------------------------------------------------------------------- Nutrition Risk Screening Details Patient Name: Aaron Cain. Date of Service: 09/04/2021 8:45 AM Medical Record Number: [id number] Patient Account Number: [id number] Date of Birth/Sex: Dec 14, 1940 (80 y.o. M) Treating RN: Yevonne Pax Primary Care Brittney Mucha: Karlene Einstein Other Clinician: Referring Arilyn Brierley: Karlene Einstein Treating Hal Norrington/Extender: Rowan Blase in Treatment: 0 Height (in): 65 Weight (lbs): 115 Body Mass Index (BMI): 19.1 Nutrition Risk Screening Items Score Screening NUTRITION RISK SCREEN: I have an illness or condition that made me change the kind and/or amount of food I eat 0 No I eat fewer than two meals per day 0 No I eat few fruits and vegetables, or milk products 0 No I have three or more drinks of beer, liquor or wine almost every day 0 No I have tooth or mouth problems that make it hard for me to eat 0 No I don't always have enough money to buy the food I need 0 No I eat alone most of the time 0 No I take three or more different prescribed or over-the-counter drugs a day 1 Yes Without wanting to, I have lost or gained 10 pounds in the last six months 0 No I am not always physically able to shop, cook and/or feed myself 2 Yes Nutrition Protocols Good Risk Protocol Moderate Risk Protocol 0 Provide education on nutrition High Risk Proctocol Risk Level: Moderate Risk Score: 3 Electronic Signature(s) Signed: 09/07/2021 6:56:11 PM By: Yevonne Pax RN Entered By: Yevonne Pax on 09/04/2021 09:10:23

## 2021-09-16 NOTE — Telephone Encounter (Signed)
Alejandro [name]  08-13-1941      Call back needed: No    Preferred call back number:   Home Phone [phone] (home)    Medications Requested:  Requested Prescriptions     Pending Prescriptions Disp Refills    cetirizine (ZYRTEC) 10 MG tablet       Sig: Take 1 tablet by mouth daily       Preferred Pharmacy:   [facility] - [place], [place] - [address] - [place] [phone] [name] [phone]  [address]  [place] [address]  Phone: [phone] Fax: [phone]      Other Instructions:

## 2021-09-16 NOTE — Telephone Encounter (Signed)
Medications Requested:  Requested Prescriptions     Pending Prescriptions Disp Refills    cetirizine (ZYRTEC) 10 MG tablet 90 tablet 1     Sig: Take 1 tablet by mouth daily       Preferred Pharmacy:   [facility] - [place], [place] - [address] - [place] [phone] [name] [phone]  [address]  [place] [address]  Phone: [phone] Fax: [phone]      Date of Last Refill:     Prescription Refill Protocol reviewed: Yes    Allergy List Reviewed and Verified: Yes    Possible medication to medication interactions reviewed: Yes    Last appt @ PCP Office: 03/06/2021     Future Appointments   Date Time Provider Department Center   10/15/2021  8:00 AM Mahala Menghini, RN [facility]       MOST RECENT BLOOD PRESSURES  BP Readings from Last 3 Encounters:   05/04/21 124/65   03/06/21 124/80   09/25/20 130/64         MOST RECENT LAB DATA  Lab Results   Component Value Date/Time    K 3.9 02/17/2021 12:41 PM    ALT 15 02/17/2021 12:41 PM    TSH 3.17 07/22/2020 08:21 AM    CHOL 107 08/08/2020 11:00 AM    HGB 12.6 02/17/2021 12:41 PM    HCT 38.4 02/17/2021 12:41 PM    HBA1CPOC 6.5 05/04/2021 12:40 PM

## 2021-09-17 ENCOUNTER — Encounter: Payer: Medicare HMO | Attending: Physician Assistant | Admitting: Physician Assistant

## 2021-09-17 ENCOUNTER — Other Ambulatory Visit: Payer: Self-pay

## 2021-09-17 DIAGNOSIS — M6249 Contracture of muscle, multiple sites: Secondary | ICD-10-CM | POA: Diagnosis not present

## 2021-09-17 DIAGNOSIS — F2089 Other schizophrenia: Secondary | ICD-10-CM | POA: Insufficient documentation

## 2021-09-17 DIAGNOSIS — L89893 Pressure ulcer of other site, stage 3: Secondary | ICD-10-CM | POA: Diagnosis not present

## 2021-09-17 DIAGNOSIS — L89153 Pressure ulcer of sacral region, stage 3: Secondary | ICD-10-CM | POA: Insufficient documentation

## 2021-09-17 DIAGNOSIS — L89324 Pressure ulcer of left buttock, stage 4: Secondary | ICD-10-CM | POA: Insufficient documentation

## 2021-09-17 DIAGNOSIS — M6281 Muscle weakness (generalized): Secondary | ICD-10-CM | POA: Insufficient documentation

## 2021-09-17 MED ORDER — CETIRIZINE HCL 10 MG PO TABS
10 MG | ORAL_TABLET | Freq: Every day | ORAL | 1 refills | Status: DC
Start: 2021-09-17 — End: 2023-04-12

## 2021-09-17 NOTE — Progress Notes (Addendum)
ALEXA, PANDYA ([id number]) Visit Report for 09/17/2021 Chief Complaint Document Details Patient Name: Aaron Cain, Aaron Cain. Date of Service: 09/17/2021 2:00 PM Medical Record Number: [id number] Patient Account Number: [id number] Date of Birth/Sex: 1941/08/25 (80 y.o. 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 Date of Service: 09/17/2021 2:00 PM Medical Record Number: [id number] Patient Account Number: [id number] Date of Birth/Sex: 1941-09-14 (80 y.o. M) Treating RN: Hansel Feinstein Primary Care Provider: Karlene Einstein Other Clinician: Referring Provider: Karlene Einstein Treating Provider/Extender: Rowan Blase in Treatment: 1 Debridement Performed for Wound #1 Left Ilium Assessment: Performed By: Physician Nelida Meuse., PA-C Debridement Type: Debridement Level of Consciousness (Pre- Awake and Alert procedure): Pre-procedure Verification/Time Out Yes - 14:36 Taken: Start Time: 14:36 Pain Control: Lidocaine Total Area Debrided (L x W): 5.7 (cm) x 5.3 (cm) = 30.21 (cm) Tissue and other material Viable, Non-Viable, Muscle, Subcutaneous, Tendon debrided: Level: Skin/Subcutaneous Tissue/Muscle Debridement Description: Excisional Instrument: Forceps, Scissors Bleeding: Minimum Hemostasis Achieved: Pressure Response to Treatment: Procedure was tolerated well Level of Consciousness (Post- Awake and  Alert procedure): Post Debridement Measurements of Total Wound Length: (cm) 5.7 Stage: Category/Stage IV Width: (cm) 5.3 Depth: (cm) 1.5 Volume: (cm) 35.59 Character of Wound/Ulcer Post Debridement: Improved Post Procedure Diagnosis Same as Pre-procedure Electronic Signature(s) Signed: 09/17/2021 4:07:12 PM By: Hansel Feinstein Signed: 09/17/2021 5:14:46 PM By: Lenda Kelp PA-C Entered By: Hansel Feinstein on 09/17/2021 14:37:17 Aaron Cain, Aaron Cain ([id number]) -------------------------------------------------------------------------------- HPI Details Patient Name: Aaron Cain. Date of Service: 09/17/2021 2:00 PM Medical Record Number: [id number] Patient Account Number: [id number] Date of Birth/Sex: 26-Jan-1941 (80 y.o. M) Treating RN: Hansel Feinstein Primary Care Provider: Karlene Einstein Other Clinician: Referring Provider: Karlene Einstein Treating Provider/Extender: Rowan Blase in Treatment: 1 History of Present Illness HPI Description: 09/04/2021 upon evaluation today patient appears to be doing somewhat poorly in regard to wounds that he has over the left ilium, left ischium, and coccyx region. The ileum location is actually the worst being a stage IV that has a significant amount of necrotic tissue MIF to perform some sharp debridement to remove this today. In regard to the other 2 today her stage III pressure ulcer is not nearly as deep nor as bad but nonetheless do need to be addressed as far as dressings are concerned. I also think the patient is probably can need antibiotic with regard to what I am seeing in the ileum location. I also think that the patient probably could benefit from Dakin's moistened gauze dressings in this area as well. 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 He is very pleasant however and has a ton of information that he loves sharing and discussing during the visit today. He seems to  inherently enjoy seeing new people and talking. 09/17/2021 upon evaluation today patient presents for reevaluation in regard to his wounds and overall I feel like he is actually doing a bit better at this point. I do not see any evidence of active infection which is great news. In general I think that we are making progress towards the appropriate direction there is definitely some necrotic tissue  in the ischial wound in fact this is necrotic tendon and some muscle that is continue to be removed. Overall outside of that I feel like there is definitive improvement here including the healing of one of the wounds and the gluteal region. Electronic Signature(s) Signed: 09/17/2021 4:58:15 PM By: Lenda Kelp PA-C Entered By: Lenda Kelp on 09/17/2021 16:58:15 Aaron Cain, Aaron Cain ([id number]) -------------------------------------------------------------------------------- Physical Exam Details Patient Name: Aaron Cain, Aaron Cain. Date of Service: 09/17/2021 2:00 PM Medical Record Number: [id number] Patient Account Number: [id number] Date of Birth/Sex: 05-03-1941 (80 y.o. M) Treating RN: Hansel Feinstein Primary Care Provider: Karlene Einstein Other Clinician: Referring Provider: Karlene Einstein Treating Provider/Extender: Rowan Blase in Treatment: 1 Constitutional Well-nourished and well-hydrated in no acute distress. Respiratory normal breathing without difficulty. Psychiatric this patient is able to make decisions and demonstrates good insight into disease process. Alert and Oriented x 3. pleasant and cooperative. Notes Upon inspection patient's wound bed showed signs of good granulation and epithelization at this point. Fortunately there does not appear to be any signs of infection which is good he is on the appropriate antibiotics at this point which is also excellent. We did have to switch to [facility] as the doxycycline was not can work for the infection noted. He has been on this  and doing quite well at this time. That is great news. We are still using the Dakin's solution for the left ischial location. Electronic Signature(s) Signed: 09/17/2021 4:59:46 PM By: Lenda Kelp PA-C Entered By: Lenda Kelp on 09/17/2021 16:59:46 Aaron Cain, Aaron Cain ([id number]) -------------------------------------------------------------------------------- Physician Orders Details Patient Name: Aaron Cain, Aaron Cain. Date of Service: 09/17/2021 2:00 PM Medical Record Number: [id number] Patient Account Number: [id number] Date of Birth/Sex: 19-Apr-1941 (80 y.o. M) Treating RN: Hansel Feinstein Primary Care Provider: Karlene Einstein Other Clinician: Referring Provider: Karlene Einstein Treating Provider/Extender: Rowan Blase in Treatment: 1 Verbal / Phone Orders: No Diagnosis Coding ICD-10 Coding Code Description [phone] Pressure ulcer of left buttock, stage 4 L89.153 Pressure ulcer of sacral region, 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, stage 3 M62.49 Contracture of muscle, multiple sites M62.81 Muscle weakness (generalized) F20.89 Other schizophrenia Follow-up Appointments o Return Appointment in 2 weeks. Home Health o [facility] for wound care. May utilize formulary equivalent dressing for wound treatment orders unless otherwise specified. Home Health Nurse may visit PRN to address patientos wound care needs. - [facility] o Scheduled days for dressing changes to be completed; exception, patient has scheduled wound care visit that day. o **Please direct any NON-WOUND related issues/requests for orders to patient's Primary Care Physician. **If current dressing causes regression in wound condition, may D/C ordered dressing product/s and apply Normal Saline Moist Dressing daily until next Wound Healing Center or Other MD appointment. **Notify Wound Healing Center of regression in wound condition at [phone]. Bathing/ Shower/ Hygiene o Clean  wound with Normal Saline or wound cleanser. - keep dressings dry o No tub bath. Off-Loading o Gel wheelchair cushion - recommended o Hospital bed/mattress o Low air-loss mattress (Group 2) - recommended o Turn and reposition every 2 hours Additional Orders / Instructions o Follow Nutritious Diet and Increase Protein Intake - increase protein Wound Treatment Wound #1 - Ilium Wound Laterality: Left Cleanser: Wound Cleanser 1 x Per Day/30 Days Discharge Instructions: Wash your hands with soap and water. Remove old dressing, discard into plastic bag and place into trash. Cleanse the wound with Wound Cleanser prior to applying a clean dressing using gauze sponges, not tissues or  cotton balls. Do not scrub or use excessive force. Pat dry using gauze sponges, not tissue or cotton balls. Primary Dressing: Gauze 1 x Per Day/30 Days Discharge Instructions: moistened with Dakins Solution Secondary Dressing: Zetuvit Plus Silicone Border Dressing 4x4 (in/in) 1 x Per Day/30 Days Wound #2 - Ischium Wound Laterality: Left Cleanser: Wound Cleanser 3 x Per Week/30 Days Discharge Instructions: Wash your hands with soap and water. Remove old dressing, discard into plastic bag and place into trash. Cleanse the wound with Wound Cleanser prior to applying a clean dressing using gauze sponges, not tissues or cotton balls. Do not scrub or use excessive force. Pat dry using gauze sponges, not tissue or cotton balls. Primary Dressing: SILVERCEL Antimicrobial Alginate Dressing, 1x12 (in/in) 3 x Per Week/30 Days Aaron Cain, Aaron Cain ([id number]) Secondary Dressing: Zetuvit Plus Silicone Border Dressing 4x4 (in/in) 3 x Per Week/30 Days Electronic Signature(s) Signed: 09/17/2021 4:07:12 PM By: Hansel Feinstein Signed: 09/17/2021 5:14:46 PM By: Lenda Kelp PA-C Entered By: Hansel Feinstein on 09/17/2021 14:41:18 Aaron Cain, Aaron Cain  ([id number]) -------------------------------------------------------------------------------- Problem List Details Patient Name: Aaron Cain, Aaron Cain. Date of Service: 09/17/2021 2:00 PM Medical Record Number: [id number] Patient Account Number: [id number] Date of Birth/Sex: [birth year] (80 y.o. M) Treating RN: Hansel Feinstein Primary Care Provider: Karlene Einstein Other Clinician: Referring Provider: Karlene Einstein Treating Provider/Extender: Rowan Blase in Treatment: 1 Active Problems ICD-10 Encounter Code Description Active Date MDM Diagnosis [phone] Pressure ulcer of left buttock, stage 4 09/04/2021 No Yes L89.153 Pressure ulcer of sacral region, stage 3 09/04/2021 No Yes L89.893 Pressure ulcer of other site, stage 3 09/04/2021 No Yes M62.49 Contracture of muscle, multiple sites 09/04/2021 No Yes M62.81 Muscle weakness (generalized) 09/04/2021 No Yes F20.89 Other schizophrenia 09/04/2021 No Yes Inactive Problems Resolved Problems Electronic Signature(s) Signed: 09/17/2021 1:46:06 PM By: Lenda Kelp PA-C Entered By: Lenda Kelp on 09/17/2021 13:46:06 Aaron Cain, Aaron Cain ([id number]) -------------------------------------------------------------------------------- Progress Note Details Patient Name: Aaron Cain. Date of Service: 09/17/2021 2:00 PM Medical Record Number: [id number] Patient Account Number: [id number] Date of Birth/Sex: 01-08-1941 (80 y.o. 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 The ileum  location is actually the worst being a stage IV that has a significant amount of necrotic tissue MIF to perform some sharp debridement to remove this today. In regard to the other 2 today her stage III pressure ulcer is not nearly as deep nor as bad but nonetheless do need to be addressed as far as dressings are concerned. I also think the patient is probably can need antibiotic with regard to what I am seeing in the ileum location. I also think that the patient probably could benefit from Dakin's moistened gauze dressings in this area as well. 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 He is very pleasant however and has a ton of information that he loves sharing and discussing during the visit today. He seems to inherently enjoy seeing new people and talking. 09/17/2021 upon evaluation today patient presents for reevaluation in regard to his wounds and overall I feel like he is actually doing a bit better  at this point. I do not see any evidence of active infection which is great news. In general I think that we are making progress towards the appropriate direction there is definitely some necrotic tissue in the ischial wound in fact this is necrotic tendon and some muscle that is continue to be removed. Overall outside of that I feel like there is definitive improvement here including the healing of one of the wounds and the gluteal region. Objective Constitutional Well-nourished and well-hydrated in no acute distress. Vitals Time Taken: 2:08 PM, Height: 65 in, Weight: 115 lbs, BMI: 19.1, Temperature: 98.6 F, Pulse: 105 bpm, Respiratory Rate: 16 breaths/min, Blood Pressure: 100/63 mmHg. Respiratory normal breathing without difficulty. Psychiatric this patient is able to make decisions and demonstrates good insight into disease process. Alert and Oriented x 3. pleasant and cooperative. General Notes: Upon inspection patient's wound  bed showed signs of good granulation and epithelization at this point. Fortunately there does not appear to be any signs of infection which is good he is on the appropriate antibiotics at this point which is also excellent. We did have to switch to [facility] as the doxycycline was not can work for the infection noted. He has been on this and doing quite well at this time. That is great news. We are still using the Dakin's solution for the left ischial location. Integumentary (Hair, Skin) Wound #1 status is Open. Original cause of wound was Gradually Appeared. The date acquired was: 07/28/2021. The wound has been in treatment 1 weeks. The wound is located on the Left Ilium. The wound measures 5.7cm length x 5.3cm width x 1.5cm depth; 23.727cm^2 area and 35.59cm^3 volume. There is muscle, tendon, and Fat Layer (Subcutaneous Tissue) exposed. There is no tunneling noted, however, there is undermining starting at 6:00 and ending at 11:00 with a maximum distance of 1.5cm. There is a medium amount of serosanguineous drainage noted. There is medium (34-66%) red granulation within the wound bed. There is a medium (34-66%) amount of necrotic tissue within the wound bed including Eschar and Adherent Slough. Wound #2 status is Open. Original cause of wound was Gradually Appeared. The date acquired was: 07/28/2021. The wound has been in treatment 1 weeks. The wound is located on the Left Ischium. The wound measures 2cm length x 1cm width x 0.1cm depth; 1.571cm^2 area and 0.157cm^3 volume. There is Fat Layer (Subcutaneous Tissue) exposed. There is no tunneling or undermining noted. There is a medium amount of serosanguineous drainage noted. There is medium (34-66%) red granulation within the wound bed. There is a medium (34-66%) amount of necrotic tissue within the wound bed including Adherent Slough. Aaron Cain, Aaron Cain ([id number]) Wound #3 status is Healed - Epithelialized. Original cause of wound was Gradually  Appeared. The date acquired was: 07/22/2021. The wound has been in treatment 1 weeks. The wound is located on the Coccyx. The wound measures 0cm length x 0cm width x 0cm depth; 0cm^2 area and 0cm^3 volume. There is no tunneling or undermining noted. There is a none present amount of drainage noted. There is no granulation within the wound bed. There is no necrotic tissue within the wound bed. Assessment Active Problems ICD-10 Pressure ulcer of left buttock, stage 4 Pressure ulcer of sacral region, stage 3 Pressure ulcer of other site, stage 3 Contracture of muscle, multiple sites Muscle weakness (generalized) Other schizophrenia Procedures Wound #1 Pre-procedure diagnosis of Wound #1 is a Pressure Ulcer located on the Left Ilium . There was a Excisional Skin/Subcutaneous Tissue/Muscle Debridement  with a total area of 30.21 sq cm performed by Nelida Meuse., PA-C. With the following instrument(s): Forceps, and Scissors to remove Viable and Non-Viable tissue/material. Material removed includes Muscle, Tendon, and Subcutaneous Tissue after achieving pain control using Lidocaine. A time out was conducted at 14:36, prior to the start of the procedure. A Minimum amount of bleeding was controlled with Pressure. The procedure was tolerated well. Post Debridement Measurements: 5.7cm length x 5.3cm width x 1.5cm depth; 35.59cm^3 volume. Post debridement Stage noted as Category/Stage IV. Character of Wound/Ulcer Post Debridement is improved. Post procedure Diagnosis Wound #1: Same as Pre-Procedure Plan Follow-up Appointments: Return Appointment in 2 weeks. Home Health: [facility] for wound care. May utilize formulary equivalent dressing for wound treatment orders unless otherwise specified. Home Health Nurse may visit PRN to address patient s wound care needs. - [facility] Scheduled days for dressing changes to be completed; exception, patient has scheduled wound care visit that  day. **Please direct any NON-WOUND related issues/requests for orders to patient's Primary Care Physician. **If current dressing causes regression in wound condition, may D/C ordered dressing product/s and apply Normal Saline Moist Dressing daily until next Wound Healing Center or Other MD appointment. **Notify Wound Healing Center of regression in wound condition at [phone]. Bathing/ Shower/ Hygiene: Clean wound with Normal Saline or wound cleanser. - keep dressings dry No tub bath. Off-Loading: Gel wheelchair cushion - recommended Hospital bed/mattress Low air-loss mattress (Group 2) - recommended Turn and reposition every 2 hours Additional Orders / Instructions: Follow Nutritious Diet and Increase Protein Intake - increase protein WOUND #1: - Ilium Wound Laterality: Left Cleanser: Wound Cleanser 1 x Per Day/30 Days Discharge Instructions: Wash your hands with soap and water. Remove old dressing, discard into plastic bag and place into trash. Cleanse the wound with Wound Cleanser prior to applying a clean dressing using gauze sponges, not tissues or cotton balls. Do not scrub or use excessive force. Pat dry using gauze sponges, not tissue or cotton balls. Primary Dressing: Gauze 1 x Per Day/30 Days Discharge Instructions: moistened with Dakins Solution Secondary Dressing: Zetuvit Plus Silicone Border Dressing 4x4 (in/in) 1 x Per Day/30 Days WOUND #2: - Ischium Wound Laterality: Left Cleanser: Wound Cleanser 3 x Per Week/30 Days Discharge Instructions: Wash your hands with soap and water. Remove old dressing, discard into plastic bag and place into trash. Dewan, RUSELL MENEELY ([id number]) Cleanse the wound with Wound Cleanser prior to applying a clean dressing using gauze sponges, not tissues or cotton balls. Do not scrub or use excessive force. Pat dry using gauze sponges, not tissue or cotton balls. Primary Dressing: SILVERCEL Antimicrobial Alginate Dressing, 1x12 (in/in) 3 x Per  Week/30 Days Secondary Dressing: Zetuvit Plus Silicone Border Dressing 4x4 (in/in) 3 x Per Week/30 Days 1. Would recommend currently that the patient continue with the Omnicef I think that still the right way to go. 2. Also can recommend that the patient continue to offload appropriately the facility is doing a great job trying to keep that up and I think overall they are doing awesome in that regard. We will see patient back for reevaluation in 1 week here in the clinic. If anything worsens or changes patient will contact our office for additional recommendations. Electronic Signature(s) Signed: 09/17/2021 5:00:04 PM By: Lenda Kelp PA-C Entered By: Lenda Kelp on 09/17/2021 17:00:04 Lukes, Aaron Cain ([id number]) -------------------------------------------------------------------------------- SuperBill Details Patient Name: CHEVIS, WEISENSEL. Date of Service: 09/17/2021 Medical Record Number: [id number] Patient Account Number: [id number] Date  of Birth/Sex: 1941/02/22 (80 y.o. M) Treating RN: Hansel Feinstein Primary Care Provider: Karlene Einstein Other Clinician: Referring Provider: Karlene Einstein Treating Provider/Extender: Rowan Blase in Treatment: 1 Diagnosis Coding ICD-10 Codes Code Description [id number] Pressure ulcer of left buttock, stage 4 L89.153 Pressure ulcer of sacral region, 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, stage 3 M62.49 Contracture of muscle, multiple sites M62.81 Muscle weakness (generalized) F20.89 Other schizophrenia Facility Procedures CPT4 Code: [id number] Description: 11043 - DEB MUSC/FASCIA 20 SQ CM/< Modifier: Quantity: 1 CPT4 Code: Description: ICD-10 Diagnosis Description L89.893 Pressure ulcer of other site, stage 3 Modifier: Quantity: Physician Procedures CPT4 Code: [id number] Description: 11043 - WC PHYS DEBR MUSCLE/FASCIA 20 SQ CM Modifier: Quantity: 1 CPT4 Code: Description: ICD-10 Diagnosis Description L89.893  Pressure ulcer of other site, stage 3 Modifier: Quantity: Electronic Signature(s) Signed: 09/17/2021 5:00:22 PM By: Lenda Kelp PA-C Previous Signature: 09/17/2021 4:07:12 PM Version By: Hansel Feinstein Entered By: Lenda Kelp on 09/17/2021 17:00:22

## 2021-09-17 NOTE — Progress Notes (Addendum)
Aaron Cain, Aaron Cain ([id number]) Visit Report for 09/17/2021 Arrival Information Details Patient Name: Aaron Cain, Aaron Cain. Date of Service: 09/17/2021 2:00 PM Medical Record Number: [id number] Patient Account Number: [id number] Date of Birth/Sex: [birth year] (80 y.o. M) Treating RN: Hansel Feinstein Primary Care Deshanti Adcox: Karlene Einstein Other Clinician: Referring Mitesh Rosendahl: Karlene Einstein Treating Jarmar Rousseau/Extender: Rowan Blase in Treatment: 1 Visit Information History Since Last Visit Added or deleted any medications: No Patient Arrived: Wheel Chair Had a fall or experienced change in No Arrival Time: 14:08 activities of daily living that may affect Accompanied By: caregiver risk of falls: Transfer Assistance: EasyPivot Patient Hospitalized since last visit: No Lift Has Dressing in Place as Prescribed: Yes Patient Requires Transmission-Based No Pain Present Now: Yes Precautions: Patient Has Alerts: No Electronic Signature(s) Signed: 09/17/2021 4:07:12 PM By: Hansel Feinstein Entered By: Hansel Feinstein on 09/17/2021 14:08:52 Finfrock, Anselm Pancoast ([id number]) -------------------------------------------------------------------------------- Clinic Level of Care Assessment Details Patient Name: Aaron Cain. Date of Service: 09/17/2021 2:00 PM Medical Record Number: [id number] Patient Account Number: [id number] Date of Birth/Sex: [birth year] (80 y.o. M) Treating RN: Hansel Feinstein Primary Care Cleophas Yoak: Karlene Einstein Other Clinician: Referring Sumiya Mamaril: Karlene Einstein Treating Aakash Hollomon/Extender: Rowan Blase in Treatment: 1 Clinic Level of Care Assessment Items TOOL 1 Quantity Score []  - Use when EandM and Procedure is performed on INITIAL visit 0 ASSESSMENTS - Nursing Assessment / Reassessment []  - General Physical Exam (combine w/ comprehensive assessment (listed just below) when performed on new 0 pt. evals) []  - 0 Comprehensive Assessment (HX, ROS, Risk  Assessments, Wounds Hx, etc.) ASSESSMENTS - Wound and Skin Assessment / Reassessment []  - Dermatologic / Skin Assessment (not related to wound area) 0 ASSESSMENTS - Ostomy and/or Continence Assessment and Care []  - Incontinence Assessment and Management 0 []  - 0 Ostomy Care Assessment and Management (repouching, etc.) PROCESS - Coordination of Care []  - Simple Patient / Family Education for ongoing care 0 []  - 0 Complex (extensive) Patient / Family Education for ongoing care []  - 0 Staff obtains Chiropractor, Records, Test Results / Process Orders []  - 0 Staff telephones HHA, Nursing Homes / Clarify orders / etc []  - 0 Routine Transfer to another Facility (non-emergent condition) []  - 0 Routine Hospital Admission (non-emergent condition) []  - 0 New Admissions / Manufacturing engineer / Ordering NPWT, Apligraf, etc. []  - 0 Emergency Hospital Admission (emergent condition) PROCESS - Special Needs []  - Pediatric / Minor Patient Management 0 []  - 0 Isolation Patient Management []  - 0 Hearing / Language / Visual special needs []  - 0 Assessment of Community assistance (transportation, D/C planning, etc.) []  - 0 Additional assistance / Altered mentation []  - 0 Support Surface(s) Assessment (bed, cushion, seat, etc.) INTERVENTIONS - Miscellaneous []  - External ear exam 0 []  - 0 Patient Transfer (multiple staff / Nurse, adult / Similar devices) []  - 0 Simple Staple / Suture removal (25 or less) []  - 0 Complex Staple / Suture removal (26 or more) []  - 0 Hypo/Hyperglycemic Management (do not check if billed separately) []  - 0 Ankle / Brachial Index (ABI) - do not check if billed separately Has the patient been seen at the hospital within the last three years: Yes Total Score: 0 Level Of Care: ____ Aaron Cain ([id number]) Electronic Signature(s) Signed: 09/17/2021 4:07:12 PM By: Hansel Feinstein Entered By: Hansel Feinstein on 09/17/2021 14:45:05 Lynk, Anselm Pancoast  ([id number]) -------------------------------------------------------------------------------- Encounter Discharge Information Details Patient Name: Aaron Cain. Date of Service: 09/17/2021 2:00 PM Medical Record Number: [id number] Patient Account Number:  [id number] Date of Birth/Sex: 1941/06/01 (80 y.o. M) Treating RN: Hansel Feinstein Primary Care Graviela Nodal: Karlene Einstein Other Clinician: Referring Thana Ramp: Karlene Einstein Treating Timur Nibert/Extender: Rowan Blase in Treatment: 1 Encounter Discharge Information Items Post Procedure Vitals Discharge Condition: Stable Temperature (F): 98.6 Ambulatory Status: Wheelchair Pulse (bpm): 106 Discharge Destination: Other (Note Required) Respiratory Rate (breaths/min): 15 Telephoned: No Blood Pressure (mmHg): 100/63 Orders Sent: Yes Transportation: Other Accompanied By: caregiver Schedule Follow-up Appointment: Yes Clinical Summary of Care: Electronic Signature(s) Signed: 09/17/2021 4:07:12 PM By: Hansel Feinstein Entered By: Hansel Feinstein on 09/17/2021 14:47:10 Mckoy, Anselm Pancoast ([id number]) -------------------------------------------------------------------------------- Lower Extremity Assessment Details Patient Name: Aaron Cain. Date of Service: 09/17/2021 2:00 PM Medical Record Number: [id number] Patient Account Number: [id number] Date of Birth/Sex: 04-29-1941 (80 y.o. M) Treating RN: Hansel Feinstein Primary Care Lexianna Weinrich: Karlene Einstein Other Clinician: Referring Roxane Puerto: Karlene Einstein Treating Bentley Haralson/Extender: Allen Derry Weeks in Treatment: 1 Electronic Signature(s) Signed: 09/17/2021 4:07:12 PM By: Hansel Feinstein Entered By: Hansel Feinstein on 09/17/2021 14:20:59 Peoples, Anselm Pancoast ([id number]) -------------------------------------------------------------------------------- Multi Wound Chart Details Patient Name: Aaron Cain, Aaron Cain. Date of Service: 09/17/2021 2:00 PM Medical Record Number:  [id number] Patient Account Number: [id number] Date of Birth/Sex: 11-04-1941 (80 y.o. M) Treating RN: Hansel Feinstein Primary Care Avri Paiva: Karlene Einstein Other Clinician: Referring Gabor Lusk: Karlene Einstein Treating Darothy Courtright/Extender: Rowan Blase in Treatment: 1 Vital Signs Height(in): 65 Pulse(bpm): 105 Weight(lbs): 115 Blood Pressure(mmHg): 100/63 Body Mass Index(BMI): 19 Temperature(F): 98.6 Respiratory Rate(breaths/min): 16 Photos: Wound Location: Left Ilium Left Ischium Coccyx Wounding Event: Gradually Appeared Gradually Appeared Gradually Appeared Primary Etiology: Pressure Ulcer Pressure Ulcer Pressure Ulcer Comorbid History: Chronic Obstructive Pulmonary Chronic Obstructive Pulmonary Chronic Obstructive Pulmonary Disease (COPD) Disease (COPD) Disease (COPD) Date Acquired: 07/28/2021 07/28/2021 07/22/2021 Weeks of Treatment: 1 1 1  Wound Status: Open Open Open Pending Amputation on No No Yes Presentation: Measurements L x W x D (cm) 5.7x5.3x1.5 2x1x0.1 0.2x0.2x0.1 Area (cm) : 23.727 1.571 0.031 Volume (cm) : 35.59 0.157 0.003 % Reduction in Area: 33.60% 55.50% 71.80% % Reduction in Volume: 28.90% 77.80% 72.70% Starting Position 1 (o'clock): 6 Ending Position 1 (o'clock): 11 Maximum Distance 1 (cm): 1.5 Undermining: Yes No No 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 Exudate Amount: Medium Medium None Present Exudate Type: Serosanguineous Serosanguineous N/A Exudate Color: red, brown red, brown N/A Granulation Amount: Medium (34-66%) Medium (34-66%) None Present (0%) Granulation Quality: Red Red N/A Necrotic Amount: Medium (34-66%) Medium (34-66%) Large (67-100%) Necrotic Tissue: Eschar, Adherent Slough Adherent Slough Eschar Exposed Structures: Fat Layer (Subcutaneous Tissue): Fat Layer (Subcutaneous Tissue): Fat Layer (Subcutaneous Tissue): Yes Yes Yes Fascia: No Fascia: No Fascia: No Tendon: No Tendon: No Tendon:  No Muscle: No Muscle: No Muscle: No Joint: No Joint: No Joint: No Bone: No Bone: No Bone: No Epithelialization: None None None Treatment Notes Electronic Signature(s) Signed: 09/17/2021 4:07:12 PM By: 13/08/2021 (Vick Frees) Entered By: [id number] on 09/17/2021 14:35:38 Worsley, 13/08/2021 (Anselm Pancoast) -------------------------------------------------------------------------------- Multi-Disciplinary Care Plan Details Patient Name: Aaron Cain, Aaron Cain. Date of Service: 09/17/2021 2:00 PM Medical Record Number: 13/08/2021 Patient Account Number: [id number] Date of Birth/Sex: 04-13-1941 (80 y.o. M) Treating RN: 10[birth year]15-1984 Primary Care Suzie Vandam: Hansel Feinstein Other Clinician: Referring Shalay Carder: Karlene Einstein Treating Lilla Callejo/Extender: Karlene Einstein in Treatment: 1 Active Inactive Electronic Signature(s) Signed: 10/27/2021 5:09:23 PM By: 10/29/2021 BSN, RN, CWS, Kim RN, BSN Signed: 10/28/2021 4:32:57 PM By: 10/30/2021 Previous Signature: 09/17/2021 4:07:12 PM Version By: 13/08/2021 Entered By: Hansel Feinstein BSN, RN, CWS, Kim on 10/27/2021 17:09:23 [place]ing, 10/29/2021  Shela Commons ([id number]) -------------------------------------------------------------------------------- Pain Assessment Details Patient Name: Aaron Cain, Aaron Cain. Date of Service: 09/17/2021 2:00 PM Medical Record Number: [id number] Patient Account Number: [id number] Date of Birth/Sex: 21-Feb-1941 (80 y.o. M) Treating RN: Hansel Feinstein Primary Care Kelson Queenan: Karlene Einstein Other Clinician: Referring Adamae Ricklefs: Karlene Einstein Treating Dawson Hollman/Extender: Rowan Blase in Treatment: 1 Active Problems Location of Pain Severity and Description of Pain Patient Has Paino Yes Site Locations Rate the pain. Current Pain Level: 3 Pain Management and Medication Current Pain Management: Electronic Signature(s) Signed: 09/17/2021 4:07:12 PM By: Hansel Feinstein Entered By: Hansel Feinstein on 09/17/2021  14:09:58 Lucci, Anselm Pancoast ([id number]) -------------------------------------------------------------------------------- Patient/Caregiver Education Details Patient Name: Aaron Cain. Date of Service: 09/17/2021 2:00 PM Medical Record Number: [id number] Patient Account Number: [id number] Date of Birth/Gender: 1941-11-04 (80 y.o. M) Treating RN: Hansel Feinstein Primary Care Physician: Karlene Einstein Other Clinician: Referring Physician: Karlene Einstein Treating Physician/Extender: Rowan Blase in Treatment: 1 Education Assessment Education Provided To: Patient and Caregiver Education Topics Provided Basic Hygiene: Nutrition: Offloading: Pain: Wound Debridement: Wound/Skin Impairment: Electronic Signature(s) Signed: 09/17/2021 4:07:12 PM By: Hansel Feinstein Entered By: Hansel Feinstein on 09/17/2021 14:45:46 Purifoy, Anselm Pancoast ([id number]) -------------------------------------------------------------------------------- Wound Assessment Details Patient Name: Aaron Cain. Date of Service: 09/17/2021 2:00 PM Medical Record Number: [id number] Patient Account Number: [id number] Date of Birth/Sex: [birth year] (80 y.o. M) Treating RN: Hansel Feinstein Primary Care Arijana Narayan: Karlene Einstein Other Clinician: Referring Deng Kemler: Karlene Einstein Treating Aneesh Faller/Extender: Rowan Blase in Treatment: 1 Wound Status Wound Number: 1 Primary Etiology: Pressure Ulcer Wound Location: Left Ilium Wound Status: Open Wounding Event: Gradually Appeared Comorbid History: Chronic Obstructive Pulmonary Disease (COPD) Date Acquired: 07/28/2021 Weeks Of Treatment: 1 Clustered Wound: No Photos Wound Measurements Length: (cm) 5.7 Width: (cm) 5.3 Depth: (cm) 1.5 Area: (cm) 23.727 Volume: (cm) 35.59 % Reduction in Area: 33.6% % Reduction in Volume: 28.9% Epithelialization: None Tunneling: No Undermining: Yes Starting Position (o'clock): 6 Ending Position (o'clock):  11 Maximum Distance: (cm) 1.5 Wound Description Classification: Category/Stage IV Exudate Amount: Medium Exudate Type: Serosanguineous Exudate Color: red, brown Foul Odor After Cleansing: No Slough/Fibrino Yes Wound Bed Granulation Amount: Medium (34-66%) Exposed Structure Granulation Quality: Red Fascia Exposed: No Necrotic Amount: Medium (34-66%) Fat Layer (Subcutaneous Tissue) Exposed: Yes Necrotic Quality: Eschar, Adherent Slough Tendon Exposed: Yes Muscle Exposed: Yes Necrosis of Muscle: No Joint Exposed: No Bone Exposed: No Electronic Signature(s) Signed: 09/17/2021 4:07:12 PM By: Hansel Feinstein Entered By: Hansel Feinstein on 09/17/2021 14:37:37 Salvino, Anselm Pancoast ([id number]) -------------------------------------------------------------------------------- Wound Assessment Details Patient Name: Aaron Cain. Date of Service: 09/17/2021 2:00 PM Medical Record Number: [id number] Patient Account Number: [id number] Date of Birth/Sex: 1941-02-12 (80 y.o. M) Treating RN: Hansel Feinstein Primary Care Desmen Schoffstall: Karlene Einstein Other Clinician: Referring Richardo Popoff: Karlene Einstein Treating Mehtab Dolberry/Extender: Rowan Blase in Treatment: 1 Wound Status Wound Number: 2 Primary Etiology: Pressure Ulcer Wound Location: Left Ischium Wound Status: Open Wounding Event: Gradually Appeared Comorbid History: Chronic Obstructive Pulmonary Disease (COPD) Date Acquired: 07/28/2021 Weeks Of Treatment: 1 Clustered Wound: No Photos Wound Measurements Length: (cm) 2 Width: (cm) 1 Depth: (cm) 0.1 Area: (cm) 1.571 Volume: (cm) 0.157 % Reduction in Area: 55.5% % Reduction in Volume: 77.8% Epithelialization: None Tunneling: No Undermining: No Wound Description Classification: Category/Stage III Exudate Amount: Medium Exudate Type: Serosanguineous Exudate Color: red, brown Foul Odor After Cleansing: No Slough/Fibrino Yes Wound Bed Granulation Amount: Medium (34-66%) Exposed  Structure Granulation Quality: Red Fascia Exposed: No Necrotic Amount: Medium (34-66%) Fat Layer (Subcutaneous Tissue) Exposed: Yes Necrotic Quality: Adherent Slough Tendon Exposed:  No Muscle Exposed: No Joint Exposed: No Bone Exposed: No Electronic Signature(s) Signed: 09/17/2021 4:07:12 PM By: Hansel Feinstein Entered By: Hansel Feinstein on 09/17/2021 14:20:08 Demartin, Anselm Pancoast ([id number]) -------------------------------------------------------------------------------- Wound Assessment Details Patient Name: Aaron Cain. Date of Service: 09/17/2021 2:00 PM Medical Record Number: [id number] Patient Account Number: [id number] Date of Birth/Sex: [birth year] (80 y.o. M) Treating RN: Hansel Feinstein Primary Care Haydin Calandra: Karlene Einstein Other Clinician: Referring Daphne Karrer: Karlene Einstein Treating Cathern Tahir/Extender: Rowan Blase in Treatment: 1 Wound Status Wound Number: 3 Primary Etiology: Pressure Ulcer Wound Location: Coccyx Wound Status: Healed - Epithelialized Wounding Event: Gradually Appeared Comorbid History: Chronic Obstructive Pulmonary Disease (COPD) Date Acquired: 07/22/2021 Weeks Of Treatment: 1 Clustered Wound: No Pending Amputation On Presentation Photos Wound Measurements Length: (cm) 0 Width: (cm) 0 Depth: (cm) 0 Area: (cm) Volume: (cm) % Reduction in Area: 100% % Reduction in Volume: 100% Epithelialization: None 0 Tunneling: No 0 Undermining: No Wound Description Classification: Category/Stage III Exudate Amount: None Present Foul Odor After Cleansing: No Slough/Fibrino Yes Wound Bed Granulation Amount: None Present (0%) Exposed Structure Necrotic Amount: None Present (0%) Fascia Exposed: No Fat Layer (Subcutaneous Tissue) Exposed: No Tendon Exposed: No Muscle Exposed: No Joint Exposed: No Bone Exposed: No Electronic Signature(s) Signed: 09/17/2021 4:07:12 PM By: Hansel Feinstein Entered By: Hansel Feinstein on 09/17/2021 14:38:01 Hoskin,  Anselm Pancoast ([id number]) -------------------------------------------------------------------------------- Vitals Details Patient Name: Aaron Cain. Date of Service: 09/17/2021 2:00 PM Medical Record Number: [id number] Patient Account Number: [id number] Date of Birth/Sex: 1941/03/31 (80 y.o. M) Treating RN: Hansel Feinstein Primary Care Satonya Lux: Karlene Einstein Other Clinician: Referring Yaeli Hartung: Karlene Einstein Treating Lakela Kuba/Extender: Rowan Blase in Treatment: 1 Vital Signs Time Taken: 14:08 Temperature (F): 98.6 Height (in): 65 Pulse (bpm): 105 Weight (lbs): 115 Respiratory Rate (breaths/min): 16 Body Mass Index (BMI): 19.1 Blood Pressure (mmHg): 100/63 Reference Range: 80 - 120 mg / dl Electronic Signature(s) Signed: 09/17/2021 4:07:12 PM By: Hansel Feinstein Entered ByHansel Feinstein on 09/17/2021 14:09:50

## 2021-09-18 ENCOUNTER — Ambulatory Visit: Payer: Medicare HMO | Admitting: Physician Assistant

## 2021-09-29 ENCOUNTER — Ambulatory Visit: Payer: Medicare HMO | Admitting: Physician Assistant

## 2021-10-05 ENCOUNTER — Other Ambulatory Visit: Payer: Self-pay

## 2021-10-05 DIAGNOSIS — Z66 Do not resuscitate: Secondary | ICD-10-CM | POA: Diagnosis present

## 2021-10-05 DIAGNOSIS — Z20822 Contact with and (suspected) exposure to covid-19: Secondary | ICD-10-CM | POA: Diagnosis present

## 2021-10-05 DIAGNOSIS — R54 Age-related physical debility: Secondary | ICD-10-CM | POA: Diagnosis present

## 2021-10-05 DIAGNOSIS — Z880 Allergy status to penicillin: Secondary | ICD-10-CM

## 2021-10-05 DIAGNOSIS — B964 Proteus (mirabilis) (morganii) as the cause of diseases classified elsewhere: Secondary | ICD-10-CM | POA: Diagnosis present

## 2021-10-05 DIAGNOSIS — Z87820 Personal history of traumatic brain injury: Secondary | ICD-10-CM

## 2021-10-05 DIAGNOSIS — Z87891 Personal history of nicotine dependence: Secondary | ICD-10-CM

## 2021-10-05 DIAGNOSIS — L89323 Pressure ulcer of left buttock, stage 3: Secondary | ICD-10-CM | POA: Diagnosis present

## 2021-10-05 DIAGNOSIS — D62 Acute posthemorrhagic anemia: Secondary | ICD-10-CM | POA: Diagnosis present

## 2021-10-05 DIAGNOSIS — L89224 Pressure ulcer of left hip, stage 4: Principal | ICD-10-CM | POA: Diagnosis present

## 2021-10-05 DIAGNOSIS — B9562 Methicillin resistant Staphylococcus aureus infection as the cause of diseases classified elsewhere: Secondary | ICD-10-CM | POA: Diagnosis present

## 2021-10-05 DIAGNOSIS — Z79899 Other long term (current) drug therapy: Secondary | ICD-10-CM

## 2021-10-05 DIAGNOSIS — R0902 Hypoxemia: Secondary | ICD-10-CM | POA: Diagnosis present

## 2021-10-05 DIAGNOSIS — G309 Alzheimer's disease, unspecified: Secondary | ICD-10-CM | POA: Diagnosis present

## 2021-10-05 DIAGNOSIS — B9620 Unspecified Escherichia coli [E. coli] as the cause of diseases classified elsewhere: Secondary | ICD-10-CM | POA: Diagnosis present

## 2021-10-05 DIAGNOSIS — Z7401 Bed confinement status: Secondary | ICD-10-CM

## 2021-10-05 DIAGNOSIS — I96 Gangrene, not elsewhere classified: Secondary | ICD-10-CM | POA: Diagnosis present

## 2021-10-05 DIAGNOSIS — G20 Parkinson's disease: Secondary | ICD-10-CM | POA: Diagnosis present

## 2021-10-05 DIAGNOSIS — Z993 Dependence on wheelchair: Secondary | ICD-10-CM

## 2021-10-05 DIAGNOSIS — F209 Schizophrenia, unspecified: Secondary | ICD-10-CM | POA: Diagnosis present

## 2021-10-05 DIAGNOSIS — E43 Unspecified severe protein-calorie malnutrition: Secondary | ICD-10-CM | POA: Diagnosis present

## 2021-10-05 DIAGNOSIS — R000 Tachycardia, unspecified: Secondary | ICD-10-CM | POA: Diagnosis present

## 2021-10-05 DIAGNOSIS — M810 Age-related osteoporosis without current pathological fracture: Secondary | ICD-10-CM | POA: Diagnosis present

## 2021-10-05 DIAGNOSIS — J449 Chronic obstructive pulmonary disease, unspecified: Secondary | ICD-10-CM | POA: Diagnosis present

## 2021-10-05 DIAGNOSIS — D72829 Elevated white blood cell count, unspecified: Secondary | ICD-10-CM | POA: Diagnosis present

## 2021-10-05 DIAGNOSIS — F0280 Dementia in other diseases classified elsewhere without behavioral disturbance: Secondary | ICD-10-CM | POA: Diagnosis present

## 2021-10-05 DIAGNOSIS — R64 Cachexia: Secondary | ICD-10-CM | POA: Diagnosis present

## 2021-10-05 DIAGNOSIS — R6510 Systemic inflammatory response syndrome (SIRS) of non-infectious origin without acute organ dysfunction: Secondary | ICD-10-CM | POA: Diagnosis present

## 2021-10-05 DIAGNOSIS — Z882 Allergy status to sulfonamides status: Secondary | ICD-10-CM

## 2021-10-05 DIAGNOSIS — Z681 Body mass index (BMI) 19 or less, adult: Secondary | ICD-10-CM

## 2021-10-05 DIAGNOSIS — K219 Gastro-esophageal reflux disease without esophagitis: Secondary | ICD-10-CM | POA: Diagnosis present

## 2021-10-05 DIAGNOSIS — E876 Hypokalemia: Secondary | ICD-10-CM | POA: Diagnosis present

## 2021-10-05 LAB — CBC WITH DIFFERENTIAL/PLATELET
Abs Immature Granulocytes: 0.19 10*3/uL — ABNORMAL HIGH (ref 0.00–0.07)
Basophils Absolute: 0 10*3/uL (ref 0.0–0.1)
Basophils Relative: 0 %
Eosinophils Absolute: 0 10*3/uL (ref 0.0–0.5)
Eosinophils Relative: 0 %
HCT: 26 % — ABNORMAL LOW (ref 39.0–52.0)
Hemoglobin: 8.1 g/dL — ABNORMAL LOW (ref 13.0–17.0)
Immature Granulocytes: 1 %
Lymphocytes Relative: 4 %
Lymphs Abs: 1 10*3/uL (ref 0.7–4.0)
MCH: 29.8 pg (ref 26.0–34.0)
MCHC: 31.2 g/dL (ref 30.0–36.0)
MCV: 95.6 fL (ref 80.0–100.0)
Monocytes Absolute: 1.3 10*3/uL — ABNORMAL HIGH (ref 0.1–1.0)
Monocytes Relative: 6 %
Neutro Abs: 19.2 10*3/uL — ABNORMAL HIGH (ref 1.7–7.7)
Neutrophils Relative %: 89 %
Platelets: 485 10*3/uL — ABNORMAL HIGH (ref 150–400)
RBC: 2.72 MIL/uL — ABNORMAL LOW (ref 4.22–5.81)
RDW: 13.3 % (ref 11.5–15.5)
WBC: 21.7 10*3/uL — ABNORMAL HIGH (ref 4.0–10.5)
nRBC: 0 % (ref 0.0–0.2)

## 2021-10-05 LAB — LACTIC ACID, PLASMA
Lactic Acid, Venous: 1.7 mmol/L (ref 0.5–1.9)
Lactic Acid, Venous: 1.5 mmol/L (ref 0.5–1.9)

## 2021-10-05 LAB — COMPREHENSIVE METABOLIC PANEL
ALT: 10 U/L (ref 0–44)
AST: 21 U/L (ref 15–41)
Albumin: 2 g/dL — ABNORMAL LOW (ref 3.5–5.0)
Alkaline Phosphatase: 71 U/L (ref 38–126)
Anion gap: 7 (ref 5–15)
BUN: 16 mg/dL (ref 8–23)
CO2: 28 mmol/L (ref 22–32)
Calcium: 8.4 mg/dL — ABNORMAL LOW (ref 8.9–10.3)
Chloride: 101 mmol/L (ref 98–111)
Creatinine, Ser: 0.64 mg/dL (ref 0.61–1.24)
GFR, Estimated: >60 mL/min (ref >60)
Glucose, Bld: 110 mg/dL — ABNORMAL HIGH (ref 70–99)
Potassium: 3.6 mmol/L (ref 3.5–5.1)
Sodium: 136 mmol/L (ref 135–145)
Total Bilirubin: 0.6 mg/dL (ref 0.3–1.2)
Total Protein: 7.2 g/dL (ref 6.5–8.1)

## 2021-10-05 NOTE — ED Notes (Signed)
Pt put on 2L of oxygen. First nurse aware. Will continue to monitor.

## 2021-10-05 NOTE — ED Triage Notes (Signed)
Pt comes with c/o wound infection to left hip. 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   Pt was treated but seems to be coming back. Foul odor present.   VSS

## 2021-10-05 NOTE — Progress Notes (Signed)
   10/05/21 1430  Clinical Encounter Type  Visited With Patient  Visit Type Initial;Social support  Referral From Other (Comment) (rounding)  Spiritual Encounters  Spiritual Needs Other (Comment)  Pt in triage area and visibly uncomfortable. Pt states he has scoliosis; odor coming from wound. Chaplain provided blankets for head support. Pt states he has not had anything to eat or drink; will defer to nursing staff for nutrition. Offered hospitality and encouragement for his patience and hope for well-being.

## 2021-10-05 NOTE — ED Provider Notes (Signed)
Emergency Medicine Provider Triage Evaluation Note  Aaron Cain , a 80 y.o. male  was evaluated in triage.  Pt complains of an infection.  Patient endorses a wound to the left hip that is poor to heal.  Patient has a history of recent gangrenous infection.  Review of Systems  Positive: Foul-smelling left buttock wound Negative: FCS  Physical Exam  BP 132/67 (BP Location: Left Arm)   Pulse (!) 122   Temp 97.9 F (36.6 C) (Oral)   Resp 20   SpO2 97%  Gen:   Awake, no distress  NAD Resp:  Normal effort CTA MSK:   Moves extremities without difficulty Left buttock wound Other:  [facility]S: RRR  Medical Decision Making  Medically screening exam initiated at 2:53 PM.  Appropriate orders placed.  [name] was informed that the remainder of the evaluation will be completed by another provider, this initial triage assessment does not replace that evaluation, and the importance of remaining in the ED until their evaluation is complete.  Geriatric patient coming from [place] years facility, for evaluation of an infected left hip wound concerning for sepsis.   Lissa Hoard, PA-C 10/05/21 1455    Jene Every, MD 10/05/21 [phone]

## 2021-10-06 ENCOUNTER — Inpatient Hospital Stay
Admission: EM | Admit: 2021-10-06 | Discharge: 2021-10-12 | DRG: 570 | Disposition: A | Discharge status: Home Health | Payer: Medicare HMO | Attending: Internal Medicine | Admitting: Internal Medicine

## 2021-10-06 ENCOUNTER — Emergency Department: Payer: Medicare HMO

## 2021-10-06 DIAGNOSIS — F0280 Dementia in other diseases classified elsewhere without behavioral disturbance: Secondary | ICD-10-CM | POA: Diagnosis present

## 2021-10-06 DIAGNOSIS — E43 Unspecified severe protein-calorie malnutrition: Secondary | ICD-10-CM | POA: Diagnosis present

## 2021-10-06 DIAGNOSIS — J449 Chronic obstructive pulmonary disease, unspecified: Secondary | ICD-10-CM

## 2021-10-06 DIAGNOSIS — D72829 Elevated white blood cell count, unspecified: Secondary | ICD-10-CM

## 2021-10-06 DIAGNOSIS — E876 Hypokalemia: Secondary | ICD-10-CM | POA: Diagnosis present

## 2021-10-06 DIAGNOSIS — D62 Acute posthemorrhagic anemia: Secondary | ICD-10-CM

## 2021-10-06 DIAGNOSIS — Z7401 Bed confinement status: Secondary | ICD-10-CM | POA: Diagnosis not present

## 2021-10-06 DIAGNOSIS — L89224 Pressure ulcer of left hip, stage 4: Secondary | ICD-10-CM

## 2021-10-06 DIAGNOSIS — F209 Schizophrenia, unspecified: Secondary | ICD-10-CM

## 2021-10-06 DIAGNOSIS — S71002A Unspecified open wound, left hip, initial encounter: Secondary | ICD-10-CM | POA: Diagnosis not present

## 2021-10-06 DIAGNOSIS — D649 Anemia, unspecified: Secondary | ICD-10-CM | POA: Diagnosis not present

## 2021-10-06 DIAGNOSIS — B9562 Methicillin resistant Staphylococcus aureus infection as the cause of diseases classified elsewhere: Secondary | ICD-10-CM | POA: Diagnosis present

## 2021-10-06 DIAGNOSIS — R6510 Systemic inflammatory response syndrome (SIRS) of non-infectious origin without acute organ dysfunction: Secondary | ICD-10-CM | POA: Diagnosis present

## 2021-10-06 DIAGNOSIS — R64 Cachexia: Secondary | ICD-10-CM | POA: Diagnosis present

## 2021-10-06 DIAGNOSIS — L89220 Pressure ulcer of left hip, unstageable: Secondary | ICD-10-CM

## 2021-10-06 DIAGNOSIS — L89323 Pressure ulcer of left buttock, stage 3: Secondary | ICD-10-CM | POA: Diagnosis present

## 2021-10-06 DIAGNOSIS — Z681 Body mass index (BMI) 19 or less, adult: Secondary | ICD-10-CM | POA: Diagnosis not present

## 2021-10-06 DIAGNOSIS — R000 Tachycardia, unspecified: Secondary | ICD-10-CM | POA: Diagnosis present

## 2021-10-06 DIAGNOSIS — G309 Alzheimer's disease, unspecified: Secondary | ICD-10-CM

## 2021-10-06 DIAGNOSIS — B9620 Unspecified Escherichia coli [E. coli] as the cause of diseases classified elsewhere: Secondary | ICD-10-CM | POA: Diagnosis present

## 2021-10-06 DIAGNOSIS — T148XXA Other injury of unspecified body region, initial encounter: Secondary | ICD-10-CM | POA: Diagnosis not present

## 2021-10-06 DIAGNOSIS — Z66 Do not resuscitate: Secondary | ICD-10-CM | POA: Diagnosis present

## 2021-10-06 DIAGNOSIS — E440 Moderate protein-calorie malnutrition: Secondary | ICD-10-CM | POA: Diagnosis not present

## 2021-10-06 DIAGNOSIS — L089 Local infection of the skin and subcutaneous tissue, unspecified: Secondary | ICD-10-CM

## 2021-10-06 DIAGNOSIS — M810 Age-related osteoporosis without current pathological fracture: Secondary | ICD-10-CM | POA: Diagnosis present

## 2021-10-06 DIAGNOSIS — G20 Parkinson's disease: Secondary | ICD-10-CM | POA: Diagnosis present

## 2021-10-06 DIAGNOSIS — R0902 Hypoxemia: Secondary | ICD-10-CM | POA: Diagnosis present

## 2021-10-06 DIAGNOSIS — Z20822 Contact with and (suspected) exposure to covid-19: Secondary | ICD-10-CM | POA: Diagnosis present

## 2021-10-06 DIAGNOSIS — L8944 Pressure ulcer of contiguous site of back, buttock and hip, stage 4: Secondary | ICD-10-CM | POA: Diagnosis not present

## 2021-10-06 DIAGNOSIS — I96 Gangrene, not elsewhere classified: Secondary | ICD-10-CM | POA: Diagnosis present

## 2021-10-06 DIAGNOSIS — L89303 Pressure ulcer of unspecified buttock, stage 3: Secondary | ICD-10-CM

## 2021-10-06 DIAGNOSIS — B964 Proteus (mirabilis) (morganii) as the cause of diseases classified elsewhere: Secondary | ICD-10-CM | POA: Diagnosis present

## 2021-10-06 LAB — ABO/RH: ABO/RH(D): B POS

## 2021-10-06 LAB — CBC
HCT: 22.9 % — ABNORMAL LOW (ref 39.0–52.0)
Hemoglobin: 7.1 g/dL — ABNORMAL LOW (ref 13.0–17.0)
MCH: 29.6 pg (ref 26.0–34.0)
MCHC: 31 g/dL (ref 30.0–36.0)
MCV: 95.4 fL (ref 80.0–100.0)
Platelets: 454 10*3/uL — ABNORMAL HIGH (ref 150–400)
RBC: 2.4 MIL/uL — ABNORMAL LOW (ref 4.22–5.81)
RDW: 13.2 % (ref 11.5–15.5)
WBC: 13.6 10*3/uL — ABNORMAL HIGH (ref 4.0–10.5)
nRBC: 0 % (ref 0.0–0.2)

## 2021-10-06 LAB — RESP PANEL BY RT-PCR (FLU A&B, COVID) ARPGX2
Influenza A by PCR: NEGATIVE
Influenza B by PCR: NEGATIVE
SARS Coronavirus 2 by RT PCR: NEGATIVE

## 2021-10-06 LAB — RETICULOCYTES
Immature Retic Fract: 19.6 % — ABNORMAL HIGH (ref 2.3–15.9)
RBC.: 3.07 MIL/uL — ABNORMAL LOW (ref 4.22–5.81)
Retic Count, Absolute: 48.2 10*3/uL (ref 19.0–186.0)
Retic Ct Pct: 1.6 % (ref 0.4–3.1)

## 2021-10-06 LAB — IRON AND TIBC
Iron: 15 ug/dL — ABNORMAL LOW (ref 45–182)
Saturation Ratios: 7 % — ABNORMAL LOW (ref 17.9–39.5)
TIBC: 204 ug/dL — ABNORMAL LOW (ref 250–450)
UIBC: 189 ug/dL

## 2021-10-06 LAB — FOLATE: Folate: 24 ng/mL (ref >5.9)

## 2021-10-06 LAB — PREPARE RBC (CROSSMATCH)

## 2021-10-06 LAB — FERRITIN: Ferritin: 475 ng/mL — ABNORMAL HIGH (ref 24–336)

## 2021-10-06 LAB — VITAMIN B12: Vitamin B-12: 898 pg/mL (ref 180–914)

## 2021-10-06 LAB — PROCALCITONIN: Procalcitonin: 0.2 ng/mL

## 2021-10-06 LAB — CREATININE, SERUM
Creatinine, Ser: 0.47 mg/dL — ABNORMAL LOW (ref 0.61–1.24)
GFR, Estimated: >60 mL/min (ref >60)

## 2021-10-06 MED ORDER — ONDANSETRON HCL 4 MG PO TABS: 4.0000 mg | ORAL_TABLET | Freq: Four times a day (QID) | ORAL | Status: DC | PRN

## 2021-10-06 MED ORDER — ONDANSETRON HCL 4 MG/2ML IJ SOLN: 4.0000 mg | Freq: Four times a day (QID) | INTRAMUSCULAR | Status: DC | PRN

## 2021-10-06 MED ORDER — HYDROCODONE-ACETAMINOPHEN 5-325 MG PO TABS
1.0000 | ORAL_TABLET | ORAL | Status: DC | PRN
  Administered 2021-10-08: 2 via ORAL
  Administered 2021-10-08: 1 via ORAL
  Administered 2021-10-09 (×2): 2 via ORAL
  Filled 2021-10-06 (×3): qty 2
  Filled 2021-10-06: qty 1

## 2021-10-06 MED ORDER — PIPERACILLIN-TAZOBACTAM 3.375 G IVPB
INTRAVENOUS | Status: AC
  Filled 2021-10-06: qty 50

## 2021-10-06 MED ORDER — PIPERACILLIN-TAZOBACTAM 3.375 G IVPB
3.3750 g | Freq: Three times a day (TID) | INTRAVENOUS | Status: DC
  Administered 2021-10-06 – 2021-10-11 (×14): 3.375 g via INTRAVENOUS
  Filled 2021-10-06 (×14): qty 50

## 2021-10-06 MED ORDER — VANCOMYCIN HCL IN DEXTROSE 1-5 GM/200ML-% IV SOLN
1000.0000 mg | Freq: Once | INTRAVENOUS | Status: AC
  Administered 2021-10-06: 1000 mg via INTRAVENOUS

## 2021-10-06 MED ORDER — SODIUM CHLORIDE 0.9 % IV SOLN
10.0000 mL/h | Freq: Once | INTRAVENOUS | Status: AC
  Administered 2021-10-06: 10 mL/h via INTRAVENOUS

## 2021-10-06 MED ORDER — ACETAMINOPHEN 650 MG RE SUPP
650.0000 mg | Freq: Four times a day (QID) | RECTAL | Status: DC | PRN
  Filled 2021-10-06: qty 1

## 2021-10-06 MED ORDER — VANCOMYCIN HCL 1250 MG/250ML IV SOLN
1250.0000 mg | INTRAVENOUS | Status: DC
  Administered 2021-10-07 – 2021-10-10 (×4): 1250 mg via INTRAVENOUS
  Filled 2021-10-06 (×6): qty 250

## 2021-10-06 MED ORDER — MORPHINE SULFATE (PF) 2 MG/ML IV SOLN
2.0000 mg | INTRAVENOUS | Status: DC | PRN
  Administered 2021-10-07 (×2): 2 mg via INTRAVENOUS
  Filled 2021-10-06 (×2): qty 1

## 2021-10-06 MED ORDER — PIPERACILLIN-TAZOBACTAM 3.375 G IVPB 30 MIN
3.3750 g | Freq: Once | INTRAVENOUS | Status: AC
  Administered 2021-10-06: 3.375 g via INTRAVENOUS

## 2021-10-06 MED ORDER — LACTATED RINGERS IV BOLUS
1000.0000 mL | Freq: Once | INTRAVENOUS | Status: AC
  Administered 2021-10-06: 1000 mL via INTRAVENOUS

## 2021-10-06 MED ORDER — ENOXAPARIN SODIUM 40 MG/0.4ML IJ SOSY
PREFILLED_SYRINGE | INTRAMUSCULAR | Status: AC
  Filled 2021-10-06: qty 0.4

## 2021-10-06 MED ORDER — VANCOMYCIN HCL IN DEXTROSE 1-5 GM/200ML-% IV SOLN
INTRAVENOUS | Status: AC
  Filled 2021-10-06: qty 200

## 2021-10-06 MED ORDER — ENOXAPARIN SODIUM 40 MG/0.4ML IJ SOSY
40.0000 mg | PREFILLED_SYRINGE | INTRAMUSCULAR | Status: DC
  Administered 2021-10-06 – 2021-10-12 (×6): 40 mg via SUBCUTANEOUS
  Filled 2021-10-06 (×5): qty 0.4

## 2021-10-06 MED ORDER — ACETAMINOPHEN 325 MG PO TABS
650.0000 mg | ORAL_TABLET | Freq: Four times a day (QID) | ORAL | Status: DC | PRN
  Administered 2021-10-09 – 2021-10-12 (×5): 650 mg via ORAL
  Filled 2021-10-06 (×5): qty 2

## 2021-10-06 NOTE — Progress Notes (Signed)
Brief Progress Note:  I have attempted to call Ardyth Gal ([facility]) who is listed as the primary contact in the patient's demographics. This has gone to voicemail each time and the voicemail is full. I attempted to call the number listed for [facility] [phone]) as well, without answer. Will continue to make attempts.   -- Lynden Oxford, PA-C [facility] 10/06/2021, 1:11 PM [phone] M-F: 7am - 4pm

## 2021-10-06 NOTE — Consult Note (Signed)
[facility] SURGICAL CONSULTATION NOTE (initial) - cpt: 99254   HISTORY OF PRESENT ILLNESS (HPI):  79 y.o. male presented to [facility] ED yesterday for evaluation of left hip wound. Patient presenting to the ED via EMS from [facility]. He has a history of dementia which limits history. History is obtained through chart review and discussion with medical care team. Patient apparently being for this wound on his left hip for a while now, reported history of worsening foul odor. Unable to illicit any further history. Work up in the ED revealed a leukocytosis to 21.7K, Hgb 8.1, renal function normal with sCr - 0.64, and normal lactic acid level. Hip XR was without evidence of osteomyelitis. He was admitted to the medicine service and started on Zosyn and Vancomycin.   Surgery is consulted by hospitalist physician Dr. Judd Gaudier, MD in this context for evaluation and management of infected left hip wound.   PAST MEDICAL HISTORY (PMH):  Past Medical History:  Diagnosis Date   Alzheimer disease ([place])    COPD (chronic obstructive pulmonary disease) (HCC)    Drug abuse (HCC)    GERD (gastroesophageal reflux disease)    Osteoporosis    Parkinson disease ([place])    Schizophrenia ([place])      PAST SURGICAL HISTORY ([place]):  History reviewed. No pertinent surgical history.   MEDICATIONS:  Prior to Admission medications   Medication Sig Start Date End Date Taking? Authorizing Provider  acetaminophen (TYLENOL) 500 MG tablet Take 1,000 mg by mouth 3 (three) times daily as needed.   Yes [provider]  alendronate (FOSAMAX) 70 MG tablet Take 70 mg by mouth once a week. Take with a full glass of water on an empty stomach.   Yes [provider]  Calcium Carb-Cholecalciferol 500-10 MG-MCG TABS Take 1 tablet by mouth 2 (two) times daily.   Yes [provider]  cetirizine (ZYRTEC) 10 MG tablet Take 10 mg by mouth daily.   Yes [provider]  divalproex  (DEPAKOTE) 125 MG DR tablet Take 125 mg by mouth 2 (two) times daily. 09/14/21  Yes [provider]  Docusate Sodium (DSS) 100 MG CAPS Take 100 mg by mouth daily.   Yes [provider]  furosemide (LASIX) 20 MG tablet Take 20 mg by mouth daily. 09/14/21  Yes [provider]  melatonin 3 MG TABS tablet Take 3 mg by mouth at bedtime. 02/06/14  Yes [provider]  meloxicam (MOBIC) 7.5 MG tablet Take 7.5 mg by mouth daily. 09/14/21  Yes [provider]  mirtazapine (REMERON) 15 MG tablet Take 15 mg by mouth at bedtime.   Yes [provider]  OLANZapine (ZYPREXA) 7.5 MG tablet Take 7.5 mg by mouth at bedtime. 09/14/21  Yes [provider]  omeprazole (PRILOSEC) 20 MG capsule Take 20 mg by mouth daily.   Yes [provider]  polyethylene glycol powder (GLYCOLAX/MIRALAX) 17 GM/SCOOP powder Take 17 g by mouth 2 (two) times daily. Mis with water and drink twice a day at 0700 and 2000   Yes [provider]  potassium chloride (KLOR-CON M) 10 MEQ tablet Take 10 mEq by mouth daily. 09/14/21  Yes [provider]  White Petrolatum-Mineral Oil (GENTEAL TEARS NIGHT-TIME) OINT Apply 1 application to eye at bedtime as needed (dry eyes).   Yes [provider]  divalproex (DEPAKOTE) 500 MG DR tablet Take 500 mg by mouth 3 (three) times daily. Patient not taking: Reported on 10/06/2021    [provider]     ALLERGIES:  Allergies  Allergen Reactions   Penicillins Rash   Sulfa Antibiotics Rash     SOCIAL HISTORY:  Social History   Socioeconomic History   Marital status: Single    Spouse name: Not on file   Number of children: Not on file   Years of education: Not on file   Highest education level: Not on file  Occupational History   Not on file  Tobacco Use   Smoking status: Former   Smokeless tobacco: Never  Vaping Use   Vaping Use: Never used  Substance and Sexual Activity   Alcohol use: Not  Currently   Drug use: Not Currently   Sexual activity: Not Currently  Other Topics Concern   Not on file  Social History Narrative   Not on file   Social Determinants of Health   Financial Resource Strain: Not on file  Food Insecurity: Not on file  Transportation Needs: Not on file  Physical Activity: Not on file  Stress: Not on file  Social Connections: Not on file  Intimate Partner Violence: Not on file     FAMILY HISTORY:  No family history on file.    REVIEW OF SYSTEMS:  Review of Systems  Unable to perform ROS: Dementia  Skin:        + Left Hip Wound    VITAL SIGNS:  Temp:  [97.9 F (36.6 C)-98.8 F (37.1 C)] 98.6 F (37 C) (11/29 0700) Pulse Rate:  [84-122] 102 (11/29 0700) Resp:  [15-24] 17 (11/29 0700) BP: (96-154)/(55-132) 154/132 (11/29 0700) SpO2:  [87 %-98 %] 93 % (11/29 0700) Weight:  [59.5 kg] 59.5 kg (11/29 0324)     Height: 5\' 10"  (177.8 cm) Weight: 59.5 kg BMI (Calculated): 18.82   INTAKE/OUTPUT:  11/28 0701 - 11/29 0700 In: 1620 [I.V.:250; Blood:370; IV Piggyback:1000] Out: -   PHYSICAL EXAM:  Physical Exam Vitals and nursing note reviewed. Exam conducted with a chaperone present.  Constitutional:      Comments: Resting comfortably, unable to contribute secondary to dementia   HENT:     Head: Normocephalic and atraumatic.     Mouth/Throat:     Mouth: Mucous membranes are dry.     Pharynx: Oropharynx is clear.  Eyes:     General: No scleral icterus.    Conjunctiva/sclera: Conjunctivae normal.  Cardiovascular:     Rate and Rhythm: Normal rate.     Pulses: Normal pulses.  Pulmonary:     Effort: Pulmonary effort is normal. No respiratory distress.  Genitourinary:    Comments: Condom catheter present  Skin:    General: Skin is warm and dry.     Findings: Erythema and wound present.     Comments: There is a large unstageable wound overlaying the left greater trochanter, the wound bed is grossly necrotic appearing tissue, this is  malodorous, there is surrounding erythema present   Neurological:     Mental Status: He is alert.     Comments: Unable to reliably assess secondary to dementia   Psychiatric:     Comments: Unable to reliably assess secondary to dementia      Labs:  CBC Latest Ref Rng & Units 10/06/2021 10/05/2021 04/17/2014  WBC 4.0 - 10.5 K/uL 13.6(H) 21.7(H) 9.6  Hemoglobin 13.0 - 17.0 g/dL 7.1(L) 8.1(L) 13.4  Hematocrit 39.0 - 52.0 % 22.9(L) 26.0(L) 40.8  Platelets 150 - 400 K/uL 454(H) 485(H) 267   CMP Latest Ref Rng & Units 10/06/2021 10/05/2021  04/17/2014  Glucose 70 - 99 mg/dL - 253(G) 644(I)  BUN 8 - 23 mg/dL - 16 14  Creatinine 3.47 - 1.24 mg/dL 4.25(Z) 5.63 8.75  Sodium 135 - 145 mmol/L - 136 138  Potassium 3.5 - 5.1 mmol/L - 3.6 3.8  Chloride 98 - 111 mmol/L - 101 102  CO2 22 - 32 mmol/L - 28 30  Calcium 8.9 - 10.3 mg/dL - 6.4(P) 9.0  Total Protein 6.5 - 8.1 g/dL - 7.2 -  Total Bilirubin 0.3 - 1.2 mg/dL - 0.6 -  Alkaline Phos 38 - 126 U/L - 71 -  AST 15 - 41 U/L - 21 -  ALT 0 - 44 U/L - 10 -     Assessment/Plan: (ICD-10's: L89.220) 80 y.o. male with infected unstageable left hip wound, complicated by pertinent comorbidities including dementia.   - Patient will certainly need this debrided today. Given his history of dementia, we will need to determine who is his DPOA to help [place] make decisions.   - Continue NPO   - Continue IV Abx (Vancomycin/Zosyn)   - Continue local wound care for now, frequent repositioning, pressure off-loading  - Further management per primary service; we will follow   All of the above findings and recommendations were discussed with the medical team.   Thank you for the opportunity to participate in this patient's care.   -- Lynden Oxford, PA-C [facility] 10/06/2021, 7:41 AM [phone] M-F: 7am - 4pm

## 2021-10-06 NOTE — Progress Notes (Signed)
[facility]  - [place] at [facility]   PATIENT NAME: Aaron Cain    MR#:  [id number]  DATE OF BIRTH:  1941-03-08  SUBJECTIVE:  patient seen in the ER. Not communicative. No family at bedside.  REVIEW OF SYSTEMS:   Review of Systems  Unable to perform ROS: Psychiatric disorder  Tolerating Diet: Tolerating PT:   DRUG ALLERGIES:   Allergies  Allergen Reactions  . Penicillins Rash  . Sulfa Antibiotics Rash    VITALS:  Blood pressure (!) 154/132, pulse (!) 102, temperature 98.6 F (37 C), temperature source Oral, resp. rate 17, height 5\' 10"  (1.778 m), weight 59.5 kg, SpO2 93 %.  PHYSICAL EXAMINATION:   Physical Exam  GENERAL:  80 y.o.-year-old patient lying in the bed with no acute distress. Thin cachectic HEENT: Head atraumatic, normocephalic. Oropharynx and nasopharynx clear.  LUNGS: Normal breath sounds bilaterally, no wheezing, rales, rhonchi. No use of accessory muscles of respiration.  CARDIOVASCULAR: S1, S2 normal. No murmurs, rubs, or gallops.  ABDOMEN: Soft, nontender, nondistended. Bowel sounds present. EXTREMITIES: No cyanosis, clubbing or edema b/l.    NEUROLOGIC: nonfocal PSYCHIATRIC:  patient is alert and oriented x 3.  SKIN:   On 10/05/2021  LABORATORY PANEL:  CBC Recent Labs  Lab 10/06/21 0316  WBC 13.6*  HGB 7.1*  HCT 22.9*  PLT 454*    Chemistries  Recent Labs  Lab 10/05/21 1047 10/06/21 0316  NA 136  --   K 3.6  --   CL 101  --   CO2 28  --   GLUCOSE 110*  --   BUN 16  --   CREATININE 0.64 0.47*  CALCIUM 8.4*  --   AST 21  --   ALT 10  --   ALKPHOS 71  --   BILITOT 0.6  --    Cardiac Enzymes No results for input(s): TROPONINI in the last 168 hours. RADIOLOGY:  DG Chest 1 View  Result Date: 10/06/2021 CLINICAL DATA:  Wound infection EXAM: CHEST  1 VIEW COMPARISON:  None. FINDINGS: The heart size and mediastinal contours are within normal limits. Both lungs are clear. The visualized skeletal structures are  unremarkable. IMPRESSION: No active disease. Electronically Signed   By: 10/08/2021 M.D.   On: 10/06/2021 01:35   DG Hip Unilat With Pelvis 2-3 Views Left  Result Date: 10/06/2021 CLINICAL DATA:  Wound infection EXAM: DG HIP (WITH OR WITHOUT PELVIS) 2-3V LEFT COMPARISON:  None. FINDINGS: There is no evidence of hip fracture or dislocation. There is no evidence of arthropathy or other focal bone abnormality. Large amount of stool. Multiple external structures obscuring the soft tissues. IMPRESSION: No acute osseous abnormality. Electronically Signed   By: 10/08/2021 M.D.   On: 10/06/2021 01:34   ASSESSMENT AND PLAN:  Aaron Cain is a 80 y.o. male with medical history significant for Dementia, COPD, schizophrenia, with multiple decubitus ulcers currently being treated at the wound care center s/p wound debridement on 11/10 who was brought to the ED with a complaint of foul odor and oozing from the wound on his left hip.  Patient is mostly wheelchair and bedbound though he is able to pivot from bed to chair.    X-ray hip no acute osseous abnormality Chest x-ray clear  Infected decubitus ulcer of buttock/left hip, stage 3 -4 SIRS--tachycardia and elevated wbc - IV Zosyn and vancomycin - IV fluids - Surgical consult for debridement--dr piscoya to take pt to OR   Acute on chornic  anemia --no active bleed reported - Hemoglobin 8.1-7.1--1 unit BT - Suspect due to blood loss from chronic wounds   protein calorie malnutrition, severe - Nutritionist consult     COPD (chronic obstructive pulmonary disease)  - DuoNebs as needed     Alzheimer disease (HCC)   Schizophrenia (HCC) - Continue home meds     DVT prophylaxis: Lovenox  Code Status: full code  Family Communication:  none  Disposition Plan: Back to previous home environment Consults called: Surgery Level of care: Med-Surg Status is: Inpatient  Remains inpatient appropriate because: infected hip wound        TOTAL  TIME TAKING CARE OF THIS PATIENT: 25 minutes.  >50% time spent on counselling and coordination of care  Note: This dictation was prepared with Dragon dictation along with smaller phrase technology. Any transcriptional errors that result from this process are unintentional.  Enedina Finner M.D    [facility]   CC: Primary care physician; Angela Cox, MD Patient ID: Aaron Cain, male   DOB: June 02, 1941, 80 y.o.   MRN: [id number]

## 2021-10-06 NOTE — ED Notes (Signed)
Matthew RN aware of assigned bed 

## 2021-10-06 NOTE — Telephone Encounter (Signed)
Pt rescheduled to 1/10 @ 3:30p.

## 2021-10-06 NOTE — Telephone Encounter (Signed)
[name] pt niece who is on consent to speak called to reschedule the appt 12/8 at 8am.    I did not cancel the appt. FYI    Please call [name]   [phone]

## 2021-10-06 NOTE — Progress Notes (Signed)
Pharmacy Antibiotic Note  Aaron Cain is a 80 y.o. male admitted on 10/06/2021 with cellulitis/wound infection.  Pharmacy has been consulted for Vancomycin dosing for 7 days and Zosyn.  Plan: Zosyn 3.375g IV q8h (4 hour infusion).  Vancomycin Pt given initial dose of Vancomycin 1 gm. Vancomycin 1250 mg IV Q 24 hrs x 7 days.  Goal AUC 400-550. Expected AUC: 506.9 SCr used: 0.8, (Actual 0.47 on 11/29)  TBW 59.5 kg < IBW 73 kg  Pharmacy will continue to follow and adjust dosing when warranted.  Height: 5\' 10"  (177.8 cm) Weight: 59.5 kg (131 lb 2.8 oz) IBW/kg (Calculated) : 73  Temp (24hrs), Avg:98.4 F (36.9 C), Min:97.9 F (36.6 C), Max:98.8 F (37.1 C)  Recent Labs  Lab 10/05/21 1047 10/05/21 1352 10/06/21 0316  WBC 21.7*  --  13.6*  CREATININE 0.64  --  0.47*  LATICACIDVEN 1.7 1.5  --     Estimated Creatinine Clearance: 64 mL/min (A) (by C-G formula based on SCr of 0.47 mg/dL (L)).    Allergies  Allergen Reactions   Penicillins Rash   Sulfa Antibiotics Rash    Antimicrobials this admission: 11/29 Zosyn >>  11/29 Vancomycin >>  Microbiology results: 11/29 BCx: Pending  Thank you for allowing pharmacy to be a part of this patient's care.  12/29, PharmD, [facility] 10/06/2021 6:05 AM

## 2021-10-06 NOTE — ED Provider Notes (Signed)
[facility] Emergency Department Provider Note   ____________________________________________   Event Date/Time   First MD Initiated Contact with Patient 10/06/21 0032     (approximate)  I have reviewed the triage vital signs and the nursing notes.   HISTORY  Chief Complaint Wound Infection  Level V caveat: Limited by dementia  HPI Aaron Cain is a 80 y.o. male brought to the ED via EMS from [facility] with a chief complaint of wound infection to his left hip.  Patient with a history of Alzheimer's dementia, COPD, Parkinson's disease, schizophrenia who is being treated at the wound care center for left wound infection.  History limited secondary to patient's dementia.     Past Medical History:  Diagnosis Date   Alzheimer disease (HCC)    COPD (chronic obstructive pulmonary disease) (HCC)    Drug abuse (HCC)    GERD (gastroesophageal reflux disease)    Osteoporosis    Parkinson disease (HCC)    Schizophrenia (HCC)     Patient Active Problem List   Diagnosis Date Noted   COPD (chronic obstructive pulmonary disease) (HCC)    Alzheimer disease (HCC)    Schizophrenia (HCC)    Wound infection    Decubitus ulcer of buttock, stage 3 (HCC)    Acute blood loss anemia      History reviewed. No pertinent surgical history.  Prior to Admission medications   Medication Sig Start Date End Date Taking? Authorizing Provider  alendronate (FOSAMAX) 70 MG tablet Take 70 mg by mouth once a week. Take with a full glass of water on an empty stomach.    [provider]  divalproex (DEPAKOTE) 500 MG DR tablet Take 500 mg by mouth 3 (three) times daily.    [provider]  mirtazapine (REMERON) 30 MG tablet Take 30 mg by mouth at bedtime.    [provider]  omeprazole (PRILOSEC) 20 MG capsule Take 20 mg by mouth daily.    [provider]    Allergies Penicillins and Sulfa antibiotics  No family history on  file.  Social History Social History   Tobacco Use   Smoking status: Former   Smokeless tobacco: Never  Building services engineer Use: Never used  Substance Use Topics   Alcohol use: Not Currently   Drug use: Not Currently    Review of Systems  Constitutional: No fever/chills Eyes: No visual changes. ENT: No sore throat. Cardiovascular: Denies chest pain. Respiratory: Denies shortness of breath. Gastrointestinal: No abdominal pain.  No nausea, no vomiting.  No diarrhea.  No constipation. Genitourinary: Negative for dysuria. Musculoskeletal: Positive for left hip wound.  Negative for back pain. Skin: Negative for rash. Neurological: Negative for headaches, focal weakness or numbness.   ____________________________________________   PHYSICAL EXAM:  VITAL SIGNS: ED Triage Vitals  Enc Vitals Group     BP 10/05/21 1054 132/67     Pulse Rate 10/05/21 1054 (!) 122     Resp 10/05/21 1054 20     Temp 10/05/21 1054 97.9 F (36.6 C)     Temp Source 10/05/21 1054 Oral     SpO2 10/05/21 1054 97 %     Weight --      Height --      Head Circumference --      Peak Flow --      Pain Score 10/05/21 1012 10     Pain Loc --      Pain Edu? --  Excl. in GC? --     Constitutional: Alert and oriented.  Elderly, cachectic appearing and in mild acute distress. Eyes: Conjunctivae are normal. PERRL. EOMI. Head: Atraumatic. Nose: No congestion/rhinnorhea. Mouth/Throat: Mucous membranes are mildly dry. Neck: No stridor.   Cardiovascular: Normal rate, regular rhythm. Grossly normal heart sounds.  Good peripheral circulation. Respiratory: Normal respiratory effort.  No retractions. Lungs CTAB. Gastrointestinal: Soft and nontender to light or deep palpation. No distention. No abdominal bruits. No CVA tenderness. Musculoskeletal: 5 x 4 cm deep necrotic wound to left hip with foul odor.   Neurologic:  Normal speech and language. No gross focal neurologic deficits are appreciated.  Skin:   Skin is warm, dry and intact except left hip as above. No rash noted. Psychiatric: Mood and affect are normal. Speech and behavior are normal.  ____________________________________________   LABS (all labs ordered are listed, but only abnormal results are displayed)  Labs Reviewed  COMPREHENSIVE METABOLIC PANEL - Abnormal; Notable for the following components:      Result Value   Glucose, Bld 110 (*)    Calcium 8.4 (*)    Albumin 2.0 (*)    All other components within normal limits  CBC WITH DIFFERENTIAL/PLATELET - Abnormal; Notable for the following components:   WBC 21.7 (*)    RBC 2.72 (*)    Hemoglobin 8.1 (*)    HCT 26.0 (*)    Platelets 485 (*)    Neutro Abs 19.2 (*)    Monocytes Absolute 1.3 (*)    Abs Immature Granulocytes 0.19 (*)    All other components within normal limits  CBC - Abnormal; Notable for the following components:   WBC 13.6 (*)    RBC 2.40 (*)    Hemoglobin 7.1 (*)    HCT 22.9 (*)    Platelets 454 (*)    All other components within normal limits  CREATININE, SERUM - Abnormal; Notable for the following components:   Creatinine, Ser 0.47 (*)    All other components within normal limits  RESP PANEL BY RT-PCR (FLU A&B, COVID) ARPGX2  CULTURE, BLOOD (ROUTINE X 2)  CULTURE, BLOOD (ROUTINE X 2)  AEROBIC/ANAEROBIC CULTURE W GRAM STAIN (SURGICAL/DEEP WOUND)  LACTIC ACID, PLASMA  LACTIC ACID, PLASMA  PROCALCITONIN  URINALYSIS, ROUTINE W REFLEX MICROSCOPIC  PREPARE RBC (CROSSMATCH)  TYPE AND SCREEN   ____________________________________________  EKG  None ____________________________________________  RADIOLOGY I, Zyron Deeley J, personally viewed and evaluated these images (plain radiographs) as part of my medical decision making, as well as reviewing the written report by the radiologist.  ED MD interpretation: No acute cardiopulmonary process; no osteomyelitis  Official radiology report(s): DG Chest 1 View  Result Date: 10/06/2021 CLINICAL  DATA:  Wound infection EXAM: CHEST  1 VIEW COMPARISON:  None. FINDINGS: The heart size and mediastinal contours are within normal limits. Both lungs are clear. The visualized skeletal structures are unremarkable. IMPRESSION: No active disease. Electronically Signed   By: Deatra Robinson M.D.   On: 10/06/2021 01:35   DG Hip Unilat With Pelvis 2-3 Views Left  Result Date: 10/06/2021 CLINICAL DATA:  Wound infection EXAM: DG HIP (WITH OR WITHOUT PELVIS) 2-3V LEFT COMPARISON:  None. FINDINGS: There is no evidence of hip fracture or dislocation. There is no evidence of arthropathy or other focal bone abnormality. Large amount of stool. Multiple external structures obscuring the soft tissues. IMPRESSION: No acute osseous abnormality. Electronically Signed   By: Deatra Robinson M.D.   On: 10/06/2021 01:34    ____________________________________________  PROCEDURES  Procedure(s) performed (including Critical Care):  .1-3 Lead EKG Interpretation Performed by: Irean Hong, MD Authorized by: Irean Hong, MD     Interpretation: normal     ECG rate:  85   ECG rate assessment: normal     Rhythm: sinus rhythm     Ectopy: none     Conduction: normal   Comments:     Placed on cardiac monitor to evaluate for arrhythmias   ____________________________________________   INITIAL IMPRESSION / ASSESSMENT AND PLAN / ED COURSE  As part of my medical decision making, I reviewed the following data within the electronic MEDICAL RECORD NUMBER Nursing notes reviewed and incorporated, Labs reviewed, Old chart reviewed (09/17/2021 wound care visit), Radiograph reviewed, Discussed with admitting physician, and Notes from prior ED visits     80 year old male from care home presenting with wound infection.  Differential diagnosis includes but is not limited to sepsis, SIRS, osteomyelitis, etc.  Laboratory results demonstrate leukocytosis, normal lactic acid.  Will obtain x-ray imaging study to evaluate for  osteomyelitis, initiate IV fluid resuscitation, broad-spectrum IV antibiotics.  Case discussed with hospitalist services for admission.  Clinical Course as of 10/06/21 0429  Tue Oct 06, 2021  1962 Noted repeat H/H trending down; WBC improving.  Will administer 2 units PRBCs. [JS]    Clinical Course User Index [JS] Irean Hong, MD     ____________________________________________   FINAL CLINICAL IMPRESSION(S) / ED DIAGNOSES  Final diagnoses:  Wound infection  Leukocytosis, unspecified type  Anemia, unspecified type     ED Discharge Orders     None        Note:  This document was prepared using Dragon voice recognition software and may include unintentional dictation errors.    Irean Hong, MD 10/06/21 [phone]

## 2021-10-06 NOTE — H&P (Signed)
History and Physical    Aaron Cain HWY:[id number] DOB: 1940/11/13 DOA: 10/06/2021  PCP: Angela Cox, MD   Patient coming from: rest home  I have personally briefly reviewed patient's relevant medical records in [facility]  Chief Complaint: painful wound left hip  HPI: Aaron Cain is a 80 y.o. male with medical history significant for Dementia, COPD, schizophrenia, with multiple decubitus ulcers currently being treated at the wound care center s/p wound debridement on 11/10 who was brought to the ED with a complaint of foul odor and oozing from the wound on his left hip.  Patient is mostly wheelchair and bedbound though he is able to pivot from bed to chair.  He has had no fever or chills.  He does have pain to the area.  Denies nausea vomiting, cough or shortness of breath or diarrhea  Hospital course: On arrival tachycardic at 122 with otherwise normal vitals Blood work WBC 21,000 with lactic acid of 1.7-1.5 Hemoglobin 8.1 and platelets 485 CMP mostly unremarkable COVID and flu negative  X-ray hip no acute osseous abnormality Chest x-ray clear  Patient given an IV fluid bolus, started on Zosyn and vancomycin.  Hospitalist consulted for admission.   Review of Systems: As per HPI otherwise all other systems on review of systems negative.   Assessment/Plan   Infected decubitus ulcer of buttock/left hip, stage 3 -4 Possible early sepsis - IV Zosyn and vancomycin - IV fluids - Surgical consult for debridement - Monitor for further signs of sepsis  Acute blood loss anemia - Hemoglobin 8.1-7.1 - Suspect due to blood loss from chronic wounds - Stool for occult blood and anemia panel - Transfusion of 2 units PRBC started in the ED  Possible protein calorie malnutrition, severe - Nutritionist consult    COPD (chronic obstructive pulmonary disease) (HCC) - DuoNebs as needed    Alzheimer disease (HCC)   Schizophrenia (HCC) - Continue home  meds   DVT prophylaxis: Lovenox  Code Status: full code  Family Communication:  none  Disposition Plan: Back to previous home environment Consults called: Surgery Status:At the time of admission, it appears that the appropriate admission status for this patient is INPATIENT. This is judged to be reasonable and necessary in order to provide the required intensity of service to ensure the patient's safety given the presenting symptoms, physical exam findings, and initial radiographic and laboratory data in the context of their  Comorbid conditions.   Patient requires inpatient status due to high intensity of service, high risk for further deterioration and high frequency of surveillance required.   I certify that at the point of admission it is my clinical judgment that the patient will require inpatient hospital care spanning beyond 2 midnights     Physical Exam: Vitals:   10/05/21 1054 10/05/21 1649 10/06/21 0034 10/06/21 0200  BP: 132/67 133/78 96/67 116/68  Pulse: (!) 122 (!) 106 87 95  Resp: 20 20 (!) 24 15  Temp: 97.9 F (36.6 C)     TempSrc: Oral     SpO2: 97% (!) 87% 96% 96%   Constitutional: Elderly male frail appearing, cachectic, oriented x 2 . Not in any apparent distress HEENT:      Head: Normocephalic and atraumatic.         Eyes: PERLA, EOMI, Conjunctivae are normal. Sclera is non-icteric.       Mouth/Throat: Mucous membranes are moist.       Neck: Supple with no signs of meningismus. Cardiovascular: Regular  rate and rhythm. No murmurs, gallops, or rubs. 2+ symmetrical distal pulses are present . No JVD. No  LE edema Respiratory: Respiratory effort normal .Lungs sounds clear bilaterally. No wheezes, crackles, or rhonchi.  Gastrointestinal: Soft, non tender, non distended. Positive bowel sounds.  Genitourinary: No CVA tenderness. Musculoskeletal: 5 x 5 cm deep ulceration with necrotic base to left hip with foul odor.  Surrounding erythema   Neurologic:  Face is  symmetric. Moving all extremities. No gross focal neurologic deficits . Skin: Skin is warm, dry.  No rash or ulcers Psychiatric: Mood and affect are appropriate     Past Medical History:  Diagnosis Date   Alzheimer disease (HCC)    COPD (chronic obstructive pulmonary disease) (HCC)    Drug abuse (HCC)    GERD (gastroesophageal reflux disease)    Osteoporosis    Parkinson disease (HCC)    Schizophrenia (HCC)     History reviewed. No pertinent surgical history.   reports that he has quit smoking. He has never used smokeless tobacco. He reports that he does not currently use alcohol. He reports that he does not currently use drugs.  Allergies  Allergen Reactions   Penicillins Rash   Sulfa Antibiotics Rash    No family history on file.  Family history reviewed and not pertinent   Prior to Admission medications   Medication Sig Start Date End Date Taking? Authorizing Provider  alendronate (FOSAMAX) 70 MG tablet Take 70 mg by mouth once a week. Take with a full glass of water on an empty stomach.    [provider]  divalproex (DEPAKOTE) 500 MG DR tablet Take 500 mg by mouth 3 (three) times daily.    [provider]  mirtazapine (REMERON) 30 MG tablet Take 30 mg by mouth at bedtime.    [provider]  omeprazole (PRILOSEC) 20 MG capsule Take 20 mg by mouth daily.    [provider]      Labs on Admission: I have personally reviewed following labs and imaging studies  CBC: Recent Labs  Lab 10/05/21 1047  WBC 21.7*  NEUTROABS 19.2*  HGB 8.1*  HCT 26.0*  MCV 95.6  PLT 485*   Basic Metabolic Panel: Recent Labs  Lab 10/05/21 1047  NA 136  K 3.6  CL 101  CO2 28  GLUCOSE 110*  BUN 16  CREATININE 0.64  CALCIUM 8.4*   GFR: CrCl cannot be calculated (Unknown ideal weight.). Liver Function Tests: Recent Labs  Lab 10/05/21 1047  AST 21  ALT 10  ALKPHOS 71  BILITOT 0.6  PROT 7.2  ALBUMIN 2.0*   No results for input(s):  LIPASE, AMYLASE in the last 168 hours. No results for input(s): AMMONIA in the last 168 hours. Coagulation Profile: No results for input(s): INR, PROTIME in the last 168 hours. Cardiac Enzymes: No results for input(s): CKTOTAL, CKMB, CKMBINDEX, TROPONINI in the last 168 hours. BNP (last 3 results) No results for input(s): PROBNP in the last 8760 hours. HbA1C: No results for input(s): HGBA1C in the last 72 hours. CBG: No results for input(s): GLUCAP in the last 168 hours. Lipid Profile: No results for input(s): CHOL, HDL, LDLCALC, TRIG, CHOLHDL, LDLDIRECT in the last 72 hours. Thyroid Function Tests: No results for input(s): TSH, T4TOTAL, FREET4, T3FREE, THYROIDAB in the last 72 hours. Anemia Panel: No results for input(s): VITAMINB12, FOLATE, FERRITIN, TIBC, IRON, RETICCTPCT in the last 72 hours. Urine analysis: No results found for: COLORURINE, APPEARANCEUR, LABSPEC, PHURINE, GLUCOSEU, HGBUR, BILIRUBINUR, KETONESUR, PROTEINUR,  UROBILINOGEN, NITRITE, LEUKOCYTESUR  Radiological Exams on Admission: DG Chest 1 View  Result Date: 10/06/2021 CLINICAL DATA:  Wound infection EXAM: CHEST  1 VIEW COMPARISON:  None. FINDINGS: The heart size and mediastinal contours are within normal limits. Both lungs are clear. The visualized skeletal structures are unremarkable. IMPRESSION: No active disease. Electronically Signed   By: Deatra Robinson M.D.   On: 10/06/2021 01:35   DG Hip Unilat With Pelvis 2-3 Views Left  Result Date: 10/06/2021 CLINICAL DATA:  Wound infection EXAM: DG HIP (WITH OR WITHOUT PELVIS) 2-3V LEFT COMPARISON:  None. FINDINGS: There is no evidence of hip fracture or dislocation. There is no evidence of arthropathy or other focal bone abnormality. Large amount of stool. Multiple external structures obscuring the soft tissues. IMPRESSION: No acute osseous abnormality. Electronically Signed   By: Deatra Robinson M.D.   On: 10/06/2021 01:34       Andris Baumann MD [facility]   10/06/2021, 3:15 AM

## 2021-10-07 ENCOUNTER — Inpatient Hospital Stay: Payer: Medicare HMO | Admitting: Anesthesiology

## 2021-10-07 ENCOUNTER — Encounter
Admission: EM | Disposition: A | Discharge status: Home Health | Payer: Self-pay | Source: Home / Self Care | Attending: Internal Medicine

## 2021-10-07 DIAGNOSIS — L89323 Pressure ulcer of left buttock, stage 3: Secondary | ICD-10-CM

## 2021-10-07 DIAGNOSIS — L89224 Pressure ulcer of left hip, stage 4: Secondary | ICD-10-CM | POA: Diagnosis not present

## 2021-10-07 DIAGNOSIS — S71002A Unspecified open wound, left hip, initial encounter: Secondary | ICD-10-CM | POA: Diagnosis not present

## 2021-10-07 DIAGNOSIS — L89220 Pressure ulcer of left hip, unstageable: Secondary | ICD-10-CM | POA: Diagnosis not present

## 2021-10-07 DIAGNOSIS — T148XXA Other injury of unspecified body region, initial encounter: Secondary | ICD-10-CM | POA: Diagnosis not present

## 2021-10-07 DIAGNOSIS — L089 Local infection of the skin and subcutaneous tissue, unspecified: Secondary | ICD-10-CM | POA: Diagnosis not present

## 2021-10-07 DIAGNOSIS — E43 Unspecified severe protein-calorie malnutrition: Secondary | ICD-10-CM | POA: Insufficient documentation

## 2021-10-07 HISTORY — PX: INCISION AND DRAINAGE ABSCESS: SHX5864

## 2021-10-07 LAB — CBC
HCT: 24.2 % — ABNORMAL LOW (ref 39.0–52.0)
Hemoglobin: 7.7 g/dL — ABNORMAL LOW (ref 13.0–17.0)
MCH: 28.8 pg (ref 26.0–34.0)
MCHC: 31.8 g/dL (ref 30.0–36.0)
MCV: 90.6 fL (ref 80.0–100.0)
Platelets: 379 10*3/uL (ref 150–400)
RBC: 2.67 MIL/uL — ABNORMAL LOW (ref 4.22–5.81)
RDW: 14.9 % (ref 11.5–15.5)
WBC: 14.6 10*3/uL — ABNORMAL HIGH (ref 4.0–10.5)
nRBC: 0 % (ref 0.0–0.2)

## 2021-10-07 LAB — TYPE AND SCREEN
ABO/RH(D): B POS
Antibody Screen: NEGATIVE
Unit division: 0

## 2021-10-07 LAB — BPAM RBC
Blood Product Expiration Date: 202212242359
ISSUE DATE / TIME: 202211290522
Unit Type and Rh: 7300

## 2021-10-07 LAB — CREATININE, SERUM
Creatinine, Ser: 0.44 mg/dL — ABNORMAL LOW (ref 0.61–1.24)
GFR, Estimated: >60 mL/min (ref >60)

## 2021-10-07 LAB — BASIC METABOLIC PANEL
Anion gap: 4 — ABNORMAL LOW (ref 5–15)
BUN: 7 mg/dL — ABNORMAL LOW (ref 8–23)
CO2: 30 mmol/L (ref 22–32)
Calcium: 7.8 mg/dL — ABNORMAL LOW (ref 8.9–10.3)
Chloride: 97 mmol/L — ABNORMAL LOW (ref 98–111)
Creatinine, Ser: 0.36 mg/dL — ABNORMAL LOW (ref 0.61–1.24)
GFR, Estimated: >60 mL/min (ref >60)
Glucose, Bld: 86 mg/dL (ref 70–99)
Potassium: 3.4 mmol/L — ABNORMAL LOW (ref 3.5–5.1)
Sodium: 131 mmol/L — ABNORMAL LOW (ref 135–145)

## 2021-10-07 LAB — SURGICAL PCR SCREEN
MRSA, PCR: POSITIVE — AB
Staphylococcus aureus: POSITIVE — AB

## 2021-10-07 SURGERY — INCISION AND DRAINAGE, ABSCESS
Anesthesia: General | Site: Hip | Laterality: Left

## 2021-10-07 MED ORDER — BUPIVACAINE-EPINEPHRINE (PF) 0.5% -1:200000 IJ SOLN
INTRAMUSCULAR | Status: DC | PRN
  Administered 2021-10-07: 30 mL via INTRAMUSCULAR

## 2021-10-07 MED ORDER — MUPIROCIN 2 % EX OINT
1.0000 "application " | TOPICAL_OINTMENT | Freq: Two times a day (BID) | CUTANEOUS | Status: AC
  Administered 2021-10-08 – 2021-10-12 (×9): 1 via NASAL
  Filled 2021-10-07: qty 22

## 2021-10-07 MED ORDER — DAKINS (1/4 STRENGTH) 0.125 % EX SOLN
CUTANEOUS | Status: DC | PRN
  Administered 2021-10-07: 1

## 2021-10-07 MED ORDER — FENTANYL CITRATE (PF) 100 MCG/2ML IJ SOLN
INTRAMUSCULAR | Status: AC
  Filled 2021-10-07: qty 2

## 2021-10-07 MED ORDER — ACETAMINOPHEN 10 MG/ML IV SOLN
INTRAVENOUS | Status: DC | PRN
  Administered 2021-10-07: 1000 mg via INTRAVENOUS

## 2021-10-07 MED ORDER — ADULT MULTIVITAMIN W/MINERALS CH
1.0000 | ORAL_TABLET | Freq: Every day | ORAL | Status: DC
  Administered 2021-10-08 – 2021-10-12 (×5): 1 via ORAL
  Filled 2021-10-07 (×5): qty 1

## 2021-10-07 MED ORDER — ACETAMINOPHEN 10 MG/ML IV SOLN
INTRAVENOUS | Status: AC
  Filled 2021-10-07: qty 100

## 2021-10-07 MED ORDER — ASCORBIC ACID 500 MG PO TABS
500.0000 mg | ORAL_TABLET | Freq: Two times a day (BID) | ORAL | Status: DC
  Administered 2021-10-08 – 2021-10-12 (×10): 500 mg via ORAL
  Filled 2021-10-07 (×10): qty 1

## 2021-10-07 MED ORDER — BUPIVACAINE-EPINEPHRINE (PF) 0.5% -1:200000 IJ SOLN
INTRAMUSCULAR | Status: AC
  Filled 2021-10-07: qty 30

## 2021-10-07 MED ORDER — ONDANSETRON HCL 4 MG/2ML IJ SOLN: 4.0000 mg | Freq: Once | INTRAMUSCULAR | Status: DC | PRN

## 2021-10-07 MED ORDER — DEXAMETHASONE SODIUM PHOSPHATE 10 MG/ML IJ SOLN
INTRAMUSCULAR | Status: DC | PRN
  Administered 2021-10-07: 5 mg via INTRAVENOUS

## 2021-10-07 MED ORDER — PHENYLEPHRINE HCL-NACL 20-0.9 MG/250ML-% IV SOLN
INTRAVENOUS | Status: AC
  Filled 2021-10-07: qty 250

## 2021-10-07 MED ORDER — PHENYLEPHRINE HCL-NACL 20-0.9 MG/250ML-% IV SOLN
INTRAVENOUS | Status: DC | PRN
  Administered 2021-10-07: 30 ug/min via INTRAVENOUS

## 2021-10-07 MED ORDER — PROPOFOL 10 MG/ML IV BOLUS
INTRAVENOUS | Status: DC | PRN
  Administered 2021-10-07: 80 mg via INTRAVENOUS

## 2021-10-07 MED ORDER — ROCURONIUM BROMIDE 100 MG/10ML IV SOLN
INTRAVENOUS | Status: DC | PRN
  Administered 2021-10-07: 30 mg via INTRAVENOUS
  Administered 2021-10-07: 20 mg via INTRAVENOUS

## 2021-10-07 MED ORDER — DAKINS (1/4 STRENGTH) 0.125 % EX SOLN
CUTANEOUS | Status: AC
  Filled 2021-10-07: qty 473

## 2021-10-07 MED ORDER — ONDANSETRON HCL 4 MG/2ML IJ SOLN
INTRAMUSCULAR | Status: DC | PRN
  Administered 2021-10-07: 4 mg via INTRAVENOUS

## 2021-10-07 MED ORDER — PHENYLEPHRINE HCL (PRESSORS) 10 MG/ML IV SOLN
INTRAVENOUS | Status: DC | PRN
  Administered 2021-10-07: 240 ug via INTRAVENOUS
  Administered 2021-10-07 (×5): 160 ug via INTRAVENOUS

## 2021-10-07 MED ORDER — ENSURE ENLIVE PO LIQD
237.0000 mL | Freq: Three times a day (TID) | ORAL | Status: DC
  Administered 2021-10-08 – 2021-10-12 (×15): 237 mL via ORAL

## 2021-10-07 MED ORDER — CHLORHEXIDINE GLUCONATE CLOTH 2 % EX PADS
6.0000 | MEDICATED_PAD | Freq: Every day | CUTANEOUS | Status: AC
  Administered 2021-10-08 – 2021-10-12 (×5): 6 via TOPICAL

## 2021-10-07 MED ORDER — FENTANYL CITRATE (PF) 100 MCG/2ML IJ SOLN
INTRAMUSCULAR | Status: DC | PRN
  Administered 2021-10-07 (×2): 25 ug via INTRAVENOUS

## 2021-10-07 MED ORDER — FENTANYL CITRATE (PF) 100 MCG/2ML IJ SOLN: 25.0000 ug | INTRAMUSCULAR | Status: DC | PRN

## 2021-10-07 MED ORDER — BUPIVACAINE LIPOSOME 1.3 % IJ SUSP
INTRAMUSCULAR | Status: AC
  Filled 2021-10-07: qty 20

## 2021-10-07 MED ORDER — 0.9 % SODIUM CHLORIDE (POUR BTL) OPTIME
TOPICAL | Status: DC | PRN
  Administered 2021-10-07: 10 mL

## 2021-10-07 MED ORDER — SODIUM CHLORIDE 0.9 % IR SOLN
Status: DC | PRN
  Administered 2021-10-07: 2500 mL

## 2021-10-07 MED ORDER — LACTATED RINGERS IV SOLN: INTRAVENOUS | Status: DC | PRN

## 2021-10-07 SURGICAL SUPPLY — 36 items
BNDG GAUZE ELAST 4 BULKY (GAUZE/BANDAGES/DRESSINGS) ×2 IMPLANT
CHLORAPREP W/TINT 26 (MISCELLANEOUS) IMPLANT
CNTNR SPEC 2.5X3XGRAD LEK (MISCELLANEOUS) ×2
CONT SPEC 4OZ STER OR WHT (MISCELLANEOUS) ×2
CONTAINER SPEC 2.5X3XGRAD LEK (MISCELLANEOUS) ×2 IMPLANT
DRAIN PENROSE 12X.25 LTX STRL (MISCELLANEOUS) ×2 IMPLANT
DRAPE LAPAROTOMY 77X122 PED (DRAPES) ×2 IMPLANT
DRSG GAUZE FLUFF 36X18 (GAUZE/BANDAGES/DRESSINGS) IMPLANT
ELECT CAUTERY BLADE TIP 2.5 (TIP) ×2
ELECT REM PT RETURN 9FT ADLT (ELECTROSURGICAL) ×2
ELECTRODE CAUTERY BLDE TIP 2.5 (TIP) ×1 IMPLANT
ELECTRODE REM PT RTRN 9FT ADLT (ELECTROSURGICAL) ×1 IMPLANT
GAUZE 4X4 16PLY ~~LOC~~+RFID DBL (SPONGE) ×2 IMPLANT
GAUZE SPONGE 4X4 12PLY STRL (GAUZE/BANDAGES/DRESSINGS) ×2 IMPLANT
GLOVE SURG SYN 7.0 (GLOVE) ×6 IMPLANT
GLOVE SURG SYN 7.5  E (GLOVE) ×2
GLOVE SURG SYN 7.5 E (GLOVE) ×2 IMPLANT
GOWN STRL REUS W/ TWL LRG LVL3 (GOWN DISPOSABLE) ×3 IMPLANT
GOWN STRL REUS W/TWL LRG LVL3 (GOWN DISPOSABLE) ×3
IV NS IRRIG 3000ML ARTHROMATIC (IV SOLUTION) ×2 IMPLANT
KIT TURNOVER KIT A (KITS) ×2 IMPLANT
LABEL OR SOLS (LABEL) IMPLANT
MANIFOLD NEPTUNE II (INSTRUMENTS) ×2 IMPLANT
NEEDLE HYPO 22GX1.5 SAFETY (NEEDLE) ×2 IMPLANT
NS IRRIG 500ML POUR BTL (IV SOLUTION) ×2 IMPLANT
PACK BASIN MINOR ARMC (MISCELLANEOUS) ×2 IMPLANT
PAD ABD DERMACEA PRESS 5X9 (GAUZE/BANDAGES/DRESSINGS) ×4 IMPLANT
PULSAVAC PLUS IRRIG FAN TIP (DISPOSABLE) ×2
SOL PREP PVP 2OZ (MISCELLANEOUS) ×2
SOLUTION PREP PVP 2OZ (MISCELLANEOUS) ×1 IMPLANT
SPONGE T-LAP 18X18 ~~LOC~~+RFID (SPONGE) ×2 IMPLANT
SWAB CULTURE AMIES ANAERIB BLU (MISCELLANEOUS) ×2 IMPLANT
SYR 20ML LL LF (SYRINGE) ×2 IMPLANT
SYR BULB IRRIG 60ML STRL (SYRINGE) ×2 IMPLANT
TIP FAN IRRIG PULSAVAC PLUS (DISPOSABLE) ×1 IMPLANT
WATER STERILE IRR 500ML POUR (IV SOLUTION) IMPLANT

## 2021-10-07 NOTE — Anesthesia Procedure Notes (Signed)
Procedure Name: Intubation Date/Time: 10/07/2021 8:47 PM Performed by: Omer Jack, CRNA Pre-anesthesia Checklist: Patient identified, Patient being monitored, Timeout performed, Emergency Drugs available and Suction available Patient Re-evaluated:Patient Re-evaluated prior to induction Oxygen Delivery Method: Circle system utilized Preoxygenation: Pre-oxygenation with 100% oxygen Induction Type: IV induction Ventilation: Mask ventilation without difficulty Laryngoscope Size: Miller and 2 Grade View: Grade I Tube type: Oral Tube size: 7.0 mm Number of attempts: 1 Airway Equipment and Method: Stylet Placement Confirmation: ETT inserted through vocal cords under direct vision, positive ETCO2 and breath sounds checked- equal and bilateral Secured at: 21 cm Tube secured with: Tape Dental Injury: Teeth and Oropharynx as per pre-operative assessment  Comments: Induced on pt bed. Contracted on right side. Moved to OR table after ETT secured

## 2021-10-07 NOTE — Progress Notes (Signed)
  Chaplain On-Call responded to a call from [employer] who reported the patient was asking for spiritual support.  Chaplain met the patient, who stated that he desired to receive the Sacrament of the Anointing of the Sick from a Hosmer called the [employer] office. Assistant Frankey Poot answered, and stated that he would contact Fr. Willadean Carol with this request.  Fr. Ottis Stain soon called this Chaplain, who reported to him the request from patient Aaron Cain. Fr. Ottis Stain arrived at the hospital at 1030 to visit the patient and to provide the [place].  Chaplain Pollyann Samples M.Div., [facility]

## 2021-10-07 NOTE — Plan of Care (Signed)
  Problem: Education: Goal: Knowledge of General Education information will improve Description: Including pain rating scale, medication(s)/side effects and non-pharmacologic comfort measures Outcome: Not Progressing   Problem: Health Behavior/Discharge Planning: Goal: Ability to manage health-related needs will improve Outcome: Progressing   Problem: Clinical Measurements: Goal: Ability to maintain clinical measurements within normal limits will improve Outcome: Progressing Goal: Will remain free from infection Outcome: Progressing Goal: Diagnostic test results will improve Outcome: Progressing Goal: Respiratory complications will improve Outcome: Progressing Goal: Cardiovascular complication will be avoided Outcome: Progressing   Problem: Activity: Goal: Risk for activity intolerance will decrease Outcome: Progressing   Problem: Nutrition: Goal: Adequate nutrition will be maintained Outcome: Progressing   Problem: Coping: Goal: Level of anxiety will decrease Outcome: Progressing   Problem: Elimination: Goal: Will not experience complications related to bowel motility Outcome: Progressing Goal: Will not experience complications related to urinary retention Outcome: Progressing   Problem: Pain Managment: Goal: General experience of comfort will improve Outcome: Progressing   Problem: Safety: Goal: Ability to remain free from injury will improve Outcome: Progressing   Problem: Skin Integrity: Goal: Risk for impaired skin integrity will decrease Outcome: Not Met (add Reason) Note: Pt having wound debridement this evening for infectious wound, progress will be documented daily post surgery

## 2021-10-07 NOTE — Brief Op Note (Signed)
10/07/2021  10:57 PM  PATIENT:  Aaron Cain  80 y.o. male  PRE-OPERATIVE DIAGNOSIS:  INFECTED LEFT HIP WOUND  POST-OPERATIVE DIAGNOSIS:  INFECTED LEFT HIP WOUND  PROCEDURE:  Procedure(s): INCISION AND DRAINAGE ABSCESS LEFT HIP WOUND (Left) and left ischial area  SURGEON:  Surgeon(s) and Role:    * Kamrie Fanton, MD - Primary  ANESTHESIA:   general  EBL:  10 ml   BLOOD ADMINISTERED:none  DRAINS: none   LOCAL MEDICATIONS USED:  BUPIVICAINE   SPECIMEN:  Source of Specimen:  Left hip wound  DISPOSITION OF SPECIMEN:   Pathology and micro  COUNTS:  YES  DICTATION: .Dragon Dictation  PLAN OF CARE:  Continue floor status after PACU  PATIENT DISPOSITION:  PACU - hemodynamically stable.   Delay start of Pharmacological VTE agent (>24hrs) due to surgical blood loss or risk of bleeding: yes

## 2021-10-07 NOTE — Progress Notes (Signed)
Initial Nutrition Assessment  DOCUMENTATION CODES:   Severe malnutrition in context of chronic illness  INTERVENTION:   Ensure Enlive po TID, each supplement provides 350 kcal and 20 grams of protein  Magic cup TID with meals, each supplement provides 290 kcal and 9 grams of protein  MVI po daily  Vitamin C 500mg  po BID   Recommend dysphagia 3 diet   Pt at high refeed risk; recommend monitor potassium, magnesium and phosphorus labs daily until stable  NUTRITION DIAGNOSIS:   Severe Malnutrition related to chronic illness (parkinsons dementia, COPD) as evidenced by severe fat depletion, severe muscle depletion.  GOAL:   Patient will meet greater than or equal to 90% of their needs  MONITOR:   PO intake, Supplement acceptance, Labs, Weight trends, Skin, I & O's  REASON FOR ASSESSMENT:   Other (Comment) (Low BMI, wounds)    ASSESSMENT:   80 y/o male with h/o Parkinsons disease, dementia, COPD, schizophrenia and substance abuse who is admitted with decubitus ulcers and infection.  Visited pt's room today. Pt sleeping at time of RD visit; RD did not wake pt as pt unable to provide any history r/t dementia. RD suspects pt with poor oral intake at baseline as pt is malnourished. Pt documented to be eating 50% of meals in hospital yesterday. Pt NPO today for scheduled I & D. RD will add supplements and vitamins to help pt meet his estimated needs and to support wound healing. Pt is likely at high refeed risk. There is no documented weight history in chart to determine if any significant recent weight changes.   Medications reviewed and include: lovenox, zosyn, vancomycin   Labs reviewed: Na 131(L), K 3.4(L), BUN 7(L), creat 0.36(L) Wbc- 14.6(H), Hgb 7.7(L), Hct 24.2(L)  NUTRITION - FOCUSED PHYSICAL EXAM:  Flowsheet Row Most Recent Value  Orbital Region Severe depletion  Upper Arm Region Severe depletion  Thoracic and Lumbar Region Severe depletion  Buccal Region Severe  depletion  Temple Region Severe depletion  Clavicle Bone Region Severe depletion  Clavicle and Acromion Bone Region Severe depletion  Scapular Bone Region Severe depletion  Dorsal Hand Severe depletion  Patellar Region Severe depletion  Anterior Thigh Region Severe depletion  Posterior Calf Region Severe depletion  Edema (RD Assessment) None  Hair Reviewed  Eyes Reviewed  Mouth Reviewed  Skin Reviewed  Nails Reviewed   Diet Order:   Diet Order             Diet NPO time specified  Diet effective now                  EDUCATION NEEDS:   Not appropriate for education at this time  Skin:  Skin Assessment: Reviewed RN Assessment (Stage II coccyx, UOP IT, stage II IT, stage IV hip)  Last BM:  11/30- type 6  Height:   Ht Readings from Last 1 Encounters:  10/06/21 5\' 10"  (1.778 m)    Weight:   Wt Readings from Last 1 Encounters:  10/06/21 59.5 kg    Ideal Body Weight:  75.45 kg  BMI:  Body mass index is 18.82 kg/m.  Estimated Nutritional Needs:   Kcal:  1700-2000kcal/day  Protein:  85-100g/day  Fluid:  1.5-1.8L/day  MS, RD, LDN Please refer to [facility] for RD and/or RD on-call/weekend/after hours pager

## 2021-10-07 NOTE — Transfer of Care (Signed)
Immediate Anesthesia Transfer of Care Note  Patient: Aaron Cain  Procedure(s) Performed: INCISION AND DRAINAGE ABSCESS LEFT HIP WOUND (Left: Hip)  Patient Location: PACU  Anesthesia Type:General  Level of Consciousness: drowsy and patient cooperative  Airway & Oxygen Therapy: Patient Spontanous Breathing and Patient connected to face mask oxygen  Post-op Assessment: Report given to RN and Post -op Vital signs reviewed and stable  Post vital signs: Reviewed and stable  Last Vitals:  Vitals Value Taken Time  BP 106/78 10/07/21 2234  Temp 36.4 C 10/07/21 2234  Pulse 112 10/07/21 2242  Resp 15 10/07/21 2242  SpO2 100 % 10/07/21 2242  Vitals shown include unvalidated device data.  Last Pain:  Vitals:   10/07/21 1915  TempSrc:   PainSc: 2          Complications: No notable events documented.

## 2021-10-07 NOTE — Plan of Care (Signed)
  Problem: Education: Goal: Knowledge of General Education information will improve Description Including pain rating scale, medication(s)/side effects and non-pharmacologic comfort measures Outcome: Progressing   Problem: Health Behavior/Discharge Planning: Goal: Ability to manage health-related needs will improve Outcome: Progressing   Problem: Clinical Measurements: Goal: Ability to maintain clinical measurements within normal limits will improve Outcome: Progressing   Problem: Elimination: Goal: Will not experience complications related to bowel motility Outcome: Progressing Goal: Will not experience complications related to urinary retention Outcome: Progressing   Problem: Pain Managment: Goal: General experience of comfort will improve Outcome: Progressing   Problem: Safety: Goal: Ability to remain free from injury will improve Outcome: Progressing   Problem: Skin Integrity: Goal: Risk for impaired skin integrity will decrease Outcome: Progressing   

## 2021-10-07 NOTE — Progress Notes (Signed)
[facility]  - [place] [facility]   PATIENT NAME: Aaron Cain    MR#:  [id number]  DATE OF BIRTH:  22-Dec-1940  SUBJECTIVE:  patient seen in the ER. Not communicative. No family at bedside.  Npo for Hip wound debridement  REVIEW OF SYSTEMS:   Review of Systems  Unable to perform ROS: Psychiatric disorder  Tolerating Diet: Tolerating PT:   DRUG ALLERGIES:   Allergies  Allergen Reactions  . Penicillins Rash  . Sulfa Antibiotics Rash    VITALS:  Blood pressure 107/65, pulse 99, temperature 97.7 F (36.5 C), temperature source Oral, resp. rate 18, height 5\' 10"  (1.778 m), weight 59.5 kg, SpO2 93 %.  PHYSICAL EXAMINATION:   Physical Exam  GENERAL:  80 y.o.-year-old patient lying in the bed with no acute distress. Thin cachectic HEENT: Head atraumatic, normocephalic. Oropharynx and nasopharynx clear.  LUNGS: Normal breath sounds bilaterally, no wheezing, rales, rhonchi. No use of accessory muscles of respiration.  CARDIOVASCULAR: S1, S2 normal. No murmurs, rubs, or gallops.  ABDOMEN: Soft, nontender, nondistended. Bowel sounds present. EXTREMITIES: No cyanosis, clubbing or edema b/l.    NEUROLOGIC: nonfocal PSYCHIATRIC:  patient is alert and oriented x 3.  SKIN:   On 10/05/2021  LABORATORY PANEL:  CBC Recent Labs  Lab 10/07/21 0442  WBC 14.6*  HGB 7.7*  HCT 24.2*  PLT 379     Chemistries  Recent Labs  Lab 10/05/21 1047 10/06/21 0316 10/07/21 0442  NA 136  --  131*  K 3.6  --  3.4*  CL 101  --  97*  CO2 28  --  30  GLUCOSE 110*  --  86  BUN 16  --  7*  CREATININE 0.64   < > 0.36*  0.44*  CALCIUM 8.4*  --  7.8*  AST 21  --   --   ALT 10  --   --   ALKPHOS 71  --   --   BILITOT 0.6  --   --    < > = values in this interval not displayed.    Cardiac Enzymes No results for input(s): TROPONINI in the last 168 hours. RADIOLOGY:  DG Chest 1 View  Result Date: 10/06/2021 CLINICAL DATA:  Wound infection EXAM: CHEST  1 VIEW  COMPARISON:  None. FINDINGS: The heart size and mediastinal contours are within normal limits. Both lungs are clear. The visualized skeletal structures are unremarkable. IMPRESSION: No active disease. Electronically Signed   By: 10/08/2021 M.D.   On: 10/06/2021 01:35   DG Hip Unilat With Pelvis 2-3 Views Left  Result Date: 10/06/2021 CLINICAL DATA:  Wound infection EXAM: DG HIP (WITH OR WITHOUT PELVIS) 2-3V LEFT COMPARISON:  None. FINDINGS: There is no evidence of hip fracture or dislocation. There is no evidence of arthropathy or other focal bone abnormality. Large amount of stool. Multiple external structures obscuring the soft tissues. IMPRESSION: No acute osseous abnormality. Electronically Signed   By: 10/08/2021 M.D.   On: 10/06/2021 01:34   ASSESSMENT AND PLAN:  Aaron Cain is a 80 y.o. male with medical history significant for Dementia, COPD, schizophrenia, with multiple decubitus ulcers currently being treated at the wound care center s/p wound debridement on 11/10 who was brought to the ED with a complaint of foul odor and oozing from the wound on his left hip.  Patient is mostly wheelchair and bedbound though he is able to pivot from bed to chair.    X-ray hip no  acute osseous abnormality Chest x-ray clear  Infected decubitus ulcer of buttock/left hip, stage 3 -4 SIRS--tachycardia and elevated wbc - IV Zosyn and vancomycin - IV fluids - Surgical consult for debridement--dr piscoya to take pt to OR today   Acute on chornic anemia --no active bleed reported - Hemoglobin 8.1-7.1--1 unit BT--7.7 - Suspect due to blood loss from chronic wounds   protein calorie malnutrition, severe - appreciate dietitian consult     COPD (chronic obstructive pulmonary disease)  - DuoNebs as needed     Alzheimer disease (HCC)   Schizophrenia (HCC) - Continue home meds     DVT prophylaxis: Lovenox  Code Status: full code  Family Communication:  none  Disposition Plan: Back to  previous home environment Consults called: Surgery Level of care: Med-Surg Status is: Inpatient  Remains inpatient appropriate because: infected hip wound        TOTAL TIME TAKING CARE OF THIS PATIENT: 25 minutes.  >50% time spent on counselling and coordination of care  Note: This dictation was prepared with Dragon dictation along with smaller phrase technology. Any transcriptional errors that result from this process are unintentional.  Enedina Finner M.D    [facility]   CC: Primary care physician; Angela Cox, MD Patient ID: Aaron Cain, male   DOB: [birth year], 80 y.o.   MRN: [id number]

## 2021-10-07 NOTE — Anesthesia Preprocedure Evaluation (Addendum)
Anesthesia Evaluation  Patient identified by MRN, date of birth, ID band Patient awake and Patient confused    Reviewed: Allergy & Precautions, H&P , NPO status , Patient's Chart, lab work & pertinent test results, reviewed documented beta blocker date and time   Airway Mallampati: II  TM Distance: >3 FB Neck ROM: full    Dental  (+) Edentulous Upper, Edentulous Lower   Pulmonary neg pulmonary ROS, COPD, former smoker,    Pulmonary exam normal        Cardiovascular Exercise Tolerance: Poor negative cardio ROS Normal cardiovascular exam Rhythm:regular Rate:Normal     Neuro/Psych PSYCHIATRIC DISORDERS Schizophrenia Dementia negative neurological ROS  negative psych ROS   GI/Hepatic negative GI ROS, Neg liver ROS, GERD  Medicated,  Endo/Other  negative endocrine ROS  Renal/GU negative Renal ROS  negative genitourinary   Musculoskeletal negative musculoskeletal ROS (+)   Abdominal   Peds negative pediatric ROS (+)  Hematology negative hematology ROS (+) Blood dyscrasia, anemia ,   Anesthesia Other Findings Decubitus ulcer s/p wound debridement on 11/10 Wound infection- scheduled for debridement of his left hip wound and is on Zosyn/Vancomycin  Hgb stable at 7.7 s/p 2 units prbc Mild hypokalemia to 3.4   Reproductive/Obstetrics negative OB ROS                            Anesthesia Physical Anesthesia Plan  ASA: 3 and emergent  Anesthesia Plan: General ETT   Post-op Pain Management:    Induction:   PONV Risk Score and Plan:   Airway Management Planned:   Additional Equipment:   Intra-op Plan:   Post-operative Plan:   Informed Consent: I have reviewed the patients History and Physical, chart, labs and discussed the procedure including the risks, benefits and alternatives for the proposed anesthesia with the patient or authorized representative who has indicated his/her  understanding and acceptance.     Dental Advisory Given  Plan Discussed with: CRNA  Anesthesia Plan Comments:         Anesthesia Quick Evaluation

## 2021-10-07 NOTE — Progress Notes (Addendum)
[facility] SURGICAL PROGRESS NOTE (cpt [phone])  Hospital Day(s): 1.  Interval History: Patient seen and examined, no acute events or new complaints overnight. Patient remains confused. He continues to have a leukocytosis to 14.6K but this is improved from 21.7K on admission. Hgb stable at 7.7. Renal function is normal with sCr - 0.36; UO - 500. Mild hypokalemia to 3.4. He was scheduled for debridement of his left hip wound yesterday (11/29) with Dr Aleen Campi; however, patient had been placed on a diet and we were unable to reach any next of kin/DPOA for consent. He continues on Zosyn/Vancomycin.   Review of Systems:  Unable to reliably preform secondary to dementia    Vital signs in last 24 hours: [min-max] current  Temp:  [97.6 F (36.4 C)-98.1 F (36.7 C)] 97.7 F (36.5 C) (11/30 0508) Pulse Rate:  [85-94] 85 (11/30 0508) Resp:  [14-20] 18 (11/30 0508) BP: (90-115)/(47-72) 90/47 (11/30 0508) SpO2:  [93 %-98 %] 93 % (11/30 0508)     Height: 5\' 10"  (177.8 cm) Weight: 59.5 kg BMI (Calculated): 18.82   Intake/Output last 2 shifts:  11/29 0701 - 11/30 0700 In: 1090 [P.O.:720; Blood:370] Out: -    Physical Exam:  Constitutional: alert, confused, no distress  HENT: normocephalic without obvious abnormality  Eyes: PERRL, EOM's grossly intact and symmetric  Respiratory: breathing non-labored at rest  Cardiovascular: regular rate and sinus rhythm  Integumentary: Unstageable ulcer to the left hip, malodorous, non-viable tissue throughout the wound base   Labs:  CBC Latest Ref Rng & Units 10/07/2021 10/06/2021 10/05/2021  WBC 4.0 - 10.5 K/uL 14.6(H) 13.6(H) 21.7(H)  Hemoglobin 13.0 - 17.0 g/dL 7.7(L) 7.1(L) 8.1(L)  Hematocrit 39.0 - 52.0 % 24.2(L) 22.9(L) 26.0(L)  Platelets 150 - 400 K/uL 379 454(H) 485(H)   CMP Latest Ref Rng & Units 10/07/2021 10/07/2021 10/06/2021  Glucose 70 - 99 mg/dL 86 - -  BUN 8 - 23 mg/dL 7(L) - -  Creatinine 10/08/2021 - 1.24 mg/dL 4.26)  8.34(H) 9.62(I)  Sodium 135 - 145 mmol/L 131(L) - -  Potassium 3.5 - 5.1 mmol/L 3.4(L) - -  Chloride 98 - 111 mmol/L 97(L) - -  CO2 22 - 32 mmol/L 30 - -  Calcium 8.9 - 10.3 mg/dL 7.8(L) - -  Total Protein 6.5 - 8.1 g/dL - - -  Total Bilirubin 0.3 - 1.2 mg/dL - - -  Alkaline Phos 38 - 126 U/L - - -  AST 15 - 41 U/L - - -  ALT 0 - 44 U/L - - -    Imaging studies: No new pertinent imaging studies   Assessment/Plan: (ICD-10's: L36.220) 80 y.o. male with infected unstageable left hip wound, complicated by pertinent comorbidities including dementia.   - Tentative plan for debridement of left hip wound today with Dr 70 pending OR/anesthesia availability. Dr Aleen Campi was able to discuss consent with the patient's niece today via telephone, and consent obtained.   - NPO today for planned procedure  - Continue IV Abx (Vancomycin/Zosyn)              - Continue local wound care for now, frequent repositioning, pressure off-loading             - Further management per primary service; we will follow   All of the above findings and recommendations were discussed with the medical team.   -- Aleen Campi, PA-C [facility] 10/07/2021, 7:30 AM [phone] M-F: 7am - 4pm

## 2021-10-07 NOTE — TOC Initial Note (Signed)
Transition of Care [facility]) - Initial/Assessment Note    Patient Details  Name: Aaron Cain MRN: [id number] Date of Birth: 30-Aug-1941  Transition of Care [facility]) CM/SW Contact:    Aaron Liner, LCSW Phone Number: 10/07/2021, 11:19 AM  Clinical Narrative:   Patient is from [facility]. Called facility owner, Aaron Cain. She confirmed he lives there, is active with [facility] for home health, and uses a wheelchair at the facility. Patient does not have a guardian. [name] is next of kin. [facility] representative confirmed he is active with nursing only. No further concerns. CSW will continue to follow patient for support and facilitate return to ALF once medically stable.              Expected Discharge Plan: Assisted Living (with home health) Barriers to Discharge: Continued Medical Work up   Patient Goals and CMS Choice     Choice offered to / list presented to : NA  Expected Discharge Plan and Services Expected Discharge Plan: Assisted Living (with home health)     Post Acute Care Choice: Resumption of Svcs/PTA Provider Living arrangements for the past 2 months: Assisted Living Facility                           HH Arranged: RN [facility] Agency: [facility] Date [facility] Agency Contacted: 10/07/21   Representative spoke with at [facility] Agency: Aaron Cain  Prior Living Arrangements/Services Living arrangements for the past 2 months: Assisted Living Facility Lives with:: Facility Resident Patient language and need for interpreter reviewed:: Yes Do you feel safe going back to the place where you live?: Yes      Need for Family Participation in Patient Care: Yes (Comment) Care giver support system in place?: Yes (comment) Current home services: Home RN, DME Criminal Activity/Legal Involvement Pertinent to Current Situation/Hospitalization: No - Comment as needed  Activities of Daily Living      Permission Sought/Granted Permission sought to share information  with : Photographer granted to share info w AGENCY: [facility]        Emotional Assessment Appearance:: Appears stated age Attitude/Demeanor/Rapport: Unable to Assess Affect (typically observed): Unable to Assess Orientation: : Oriented to Self Alcohol / Substance Use: Not Applicable Psych Involvement: No (comment)  Admission diagnosis:  Wound infection [T14.8XXA, L08.9] Anemia, unspecified type [D64.9] Leukocytosis, unspecified type [D72.829] Patient Active Problem List   Diagnosis Date Noted   COPD (chronic obstructive pulmonary disease) (HCC)    Alzheimer disease (HCC)    Schizophrenia (HCC)    Wound infection    Decubitus ulcer of buttock, stage 3 (HCC)    Acute blood loss anemia    PCP:  Aaron Cox, MD Pharmacy:  No Pharmacies Listed    Social Determinants of Health (SDOH) Interventions    Readmission Risk Interventions No flowsheet data found.

## 2021-10-08 ENCOUNTER — Ambulatory Visit: Payer: Medicare HMO | Admitting: Physician Assistant

## 2021-10-08 ENCOUNTER — Encounter: Payer: Self-pay | Admitting: Surgery

## 2021-10-08 DIAGNOSIS — T148XXA Other injury of unspecified body region, initial encounter: Secondary | ICD-10-CM | POA: Diagnosis not present

## 2021-10-08 DIAGNOSIS — L089 Local infection of the skin and subcutaneous tissue, unspecified: Secondary | ICD-10-CM | POA: Diagnosis not present

## 2021-10-08 DIAGNOSIS — D649 Anemia, unspecified: Secondary | ICD-10-CM

## 2021-10-08 DIAGNOSIS — L8944 Pressure ulcer of contiguous site of back, buttock and hip, stage 4: Secondary | ICD-10-CM

## 2021-10-08 DIAGNOSIS — E43 Unspecified severe protein-calorie malnutrition: Secondary | ICD-10-CM

## 2021-10-08 LAB — BASIC METABOLIC PANEL
Anion gap: 8 (ref 5–15)
BUN: 10 mg/dL (ref 8–23)
CO2: 28 mmol/L (ref 22–32)
Calcium: 7.8 mg/dL — ABNORMAL LOW (ref 8.9–10.3)
Chloride: 95 mmol/L — ABNORMAL LOW (ref 98–111)
Creatinine, Ser: 0.56 mg/dL — ABNORMAL LOW (ref 0.61–1.24)
GFR, Estimated: >60 mL/min (ref >60)
Glucose, Bld: 114 mg/dL — ABNORMAL HIGH (ref 70–99)
Potassium: 4.1 mmol/L (ref 3.5–5.1)
Sodium: 131 mmol/L — ABNORMAL LOW (ref 135–145)

## 2021-10-08 LAB — CBC
HCT: 24.7 % — ABNORMAL LOW (ref 39.0–52.0)
Hemoglobin: 8 g/dL — ABNORMAL LOW (ref 13.0–17.0)
MCH: 30 pg (ref 26.0–34.0)
MCHC: 32.4 g/dL (ref 30.0–36.0)
MCV: 92.5 fL (ref 80.0–100.0)
Platelets: 458 10*3/uL — ABNORMAL HIGH (ref 150–400)
RBC: 2.67 MIL/uL — ABNORMAL LOW (ref 4.22–5.81)
RDW: 14.3 % (ref 11.5–15.5)
WBC: 15.5 10*3/uL — ABNORMAL HIGH (ref 4.0–10.5)
nRBC: 0 % (ref 0.0–0.2)

## 2021-10-08 LAB — CREATININE, SERUM
Creatinine, Ser: 0.55 mg/dL — ABNORMAL LOW (ref 0.61–1.24)
GFR, Estimated: >60 mL/min (ref >60)

## 2021-10-08 LAB — MAGNESIUM: Magnesium: 2 mg/dL (ref 1.7–2.4)

## 2021-10-08 MED ORDER — DIVALPROEX SODIUM 125 MG PO DR TAB
125.0000 mg | DELAYED_RELEASE_TABLET | Freq: Two times a day (BID) | ORAL | Status: DC
  Administered 2021-10-08 – 2021-10-12 (×9): 125 mg via ORAL
  Filled 2021-10-08 (×10): qty 1

## 2021-10-08 MED ORDER — MELATONIN 5 MG PO TABS
5.0000 mg | ORAL_TABLET | Freq: Every day | ORAL | Status: DC
  Administered 2021-10-08 – 2021-10-12 (×5): 5 mg via ORAL
  Filled 2021-10-08 (×5): qty 1

## 2021-10-08 MED ORDER — MIRTAZAPINE 15 MG PO TABS
15.0000 mg | ORAL_TABLET | Freq: Every day | ORAL | Status: DC
  Administered 2021-10-08 – 2021-10-12 (×5): 15 mg via ORAL
  Filled 2021-10-08 (×5): qty 1

## 2021-10-08 MED ORDER — DAKINS (1/4 STRENGTH) 0.125 % EX SOLN
Freq: Two times a day (BID) | CUTANEOUS | Status: DC
  Filled 2021-10-08: qty 473

## 2021-10-08 MED ORDER — OLANZAPINE 7.5 MG PO TABS
7.5000 mg | ORAL_TABLET | Freq: Every day | ORAL | Status: DC
  Administered 2021-10-08 – 2021-10-12 (×5): 7.5 mg via ORAL
  Filled 2021-10-08 (×5): qty 1

## 2021-10-08 NOTE — Progress Notes (Signed)
[facility]  - [place] [facility]   PATIENT NAME: Alvon Nygaard    MR#:  [id number]  DATE OF BIRTH:  06/25/1941  SUBJECTIVE:  patient more awake and communicative. Some of the things his face doesn't make sense. He tells me he does not want peanut butter crackers. He is ready to eat some food. No family at bedside REVIEW OF SYSTEMS:   Review of Systems  Unable to perform ROS: Dementia  Tolerating Diet: Tolerating PT:   DRUG ALLERGIES:   Allergies  Allergen Reactions  . Penicillins Rash  . Sulfa Antibiotics Rash    VITALS:  Blood pressure 106/62, pulse 78, temperature 97.9 F (36.6 C), resp. rate 12, height 5\' 10"  (1.778 m), weight 59.5 kg, SpO2 99 %.  PHYSICAL EXAMINATION:   Physical Exam  GENERAL:  80 y.o.-year-old patient lying in the bed with no acute distress. Thin cachectic HEENT: Head atraumatic, normocephalic. Oropharynx and nasopharynx clear.  LUNGS: Normal breath sounds bilaterally, no wheezing, rales, rhonchi. No use of accessory muscles of respiration.  CARDIOVASCULAR: S1, S2 normal. No murmurs, rubs, or gallops.  ABDOMEN: Soft, nontender, nondistended. Bowel sounds present. EXTREMITIES: No cyanosis, clubbing or edema b/l.    NEUROLOGIC: nonfocal PSYCHIATRIC:  patient is alert and oriented x 3.  SKIN:   On 10/05/2021  LABORATORY PANEL:  CBC Recent Labs  Lab 10/08/21 0521  WBC 15.5*  HGB 8.0*  HCT 24.7*  PLT 458*     Chemistries  Recent Labs  Lab 10/05/21 1047 10/06/21 0316 10/08/21 0521  NA 136   < > 131*  K 3.6   < > 4.1  CL 101   < > 95*  CO2 28   < > 28  GLUCOSE 110*   < > 114*  BUN 16   < > 10  CREATININE 0.64   < > 0.56*  0.55*  CALCIUM 8.4*   < > 7.8*  MG  --   --  2.0  AST 21  --   --   ALT 10  --   --   ALKPHOS 71  --   --   BILITOT 0.6  --   --    < > = values in this interval not displayed.    Cardiac Enzymes No results for input(s): TROPONINI in the last 168 hours. RADIOLOGY:  No results  found. ASSESSMENT AND PLAN:  SHIVEN JUNIOUS is a 80 y.o. male with medical history significant for Dementia, COPD, schizophrenia, with multiple decubitus ulcers currently being treated at the wound care center s/p wound debridement on 11/10 who was brought to the ED with a complaint of foul odor and oozing from the wound on his left hip.  Patient is mostly wheelchair and bedbound though he is able to pivot from bed to chair.    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 -- s/p:  Debridement and drainage of left hip and left ischial  decubitus wounds.  -- ID consult place for IV antibiotics   Acute on chronic anemia --no active bleed reported - Hemoglobin 8.1-7.1--1 unit BT--7.7--8.0 - Suspect due to blood loss from chronic wounds   protein calorie malnutrition, severe - appreciate dietitian consult  COPD (chronic obstructive pulmonary disease)  - DuoNebs as needed     Alzheimer disease (HCC)   Schizophrenia (HCC) - Continue home meds     DVT prophylaxis: Lovenox  Code Status: full code  Family Communication:  none  Disposition Plan: Back to previous home environment Consults called: Surgery Level of care: Med-Surg Status is: Inpatient  Remains inpatient appropriate because: infected hip wound        TOTAL TIME TAKING CARE OF THIS PATIENT: 25 minutes.  >50% time spent on counselling and coordination of care  Note: This dictation was prepared with Dragon dictation along with smaller phrase technology. Any transcriptional errors that result from this process are unintentional.  Enedina Finner M.D    [facility]   CC: Primary care physician; Angela Cox, MD Patient ID: [name], male   DOB: 12/21/1940, 80 y.o.   MRN: [id number]

## 2021-10-08 NOTE — TOC Progression Note (Addendum)
Transition of Care [facility]) - Progression Note    Patient Details  Name: Aaron Cain MRN: [id number] Date of Birth: [birth year]  [facility] Care [facility]) CM/SW Contact  Margarito Liner, LCSW Phone Number: 10/08/2021, 9:25 AM  Clinical Narrative:  [facility] representative is aware of discharge wound care needs. She is asking about a teachable caregiver for when they are unable to come to the facility since they only come out 2-3 times per week. Spoke to [facility] who said that would be fine because they were already packing the wound prior to admission. Also discussed potential for wound vac. Leanne Chang said that would be fine as well since they would not have to manage it and the [facility] RN would just change the dressing 2-3 times per week. Sent information to [employer]. Awaiting response.   4:02 pm: [facility] said they can manage wound vac at ALF if necessary.  Expected Discharge Plan: Assisted Living (with home health) Barriers to Discharge: Continued Medical Work up  Expected Discharge Plan and Services Expected Discharge Plan: Assisted Living (with home health)     Post Acute Care Choice: Resumption of Svcs/PTA Provider Living arrangements for the past 2 months: Assisted Living Facility                           HH Arranged: RN [facility] Agency: [facility] Date [facility] Agency Contacted: 10/07/21   Representative spoke with at [facility] Agency: [facility]   Social Determinants of Health (SDOH) Interventions    Readmission Risk Interventions No flowsheet data found.

## 2021-10-08 NOTE — Anesthesia Postprocedure Evaluation (Signed)
Anesthesia Post Note  Patient: Aaron Cain  Procedure(s) Performed: INCISION AND DRAINAGE ABSCESS LEFT HIP WOUND (Left: Hip)  Patient location during evaluation: PACU Anesthesia Type: General Level of consciousness: awake and alert Pain management: pain level controlled Vital Signs Assessment: post-procedure vital signs reviewed and stable Respiratory status: spontaneous breathing, nonlabored ventilation, respiratory function stable and patient connected to nasal cannula oxygen Cardiovascular status: blood pressure returned to baseline and stable Postop Assessment: no apparent nausea or vomiting Anesthetic complications: no   No notable events documented.   Last Vitals:  Vitals:   10/08/21 0806 10/08/21 1539  BP: 106/62 134/75  Pulse: 78 93  Resp: 12 16  Temp: 36.6 C 36.9 C  SpO2: 99% 95%    Last Pain:  Vitals:   10/08/21 1443  TempSrc:   PainSc: 6                  Yevette Edwards

## 2021-10-08 NOTE — Progress Notes (Signed)
[facility] SUR[facility]ICAL PROGRESS NOTE  Hospital Day(s): 2.   Post op day(s): 1 Day Post-Op.   Interval History:  Patient seen and examined No acute events or new complaints overnight.  Patient sleeping, arouses, complains of left hip pain. He does not contribute much more given his dementia  Slight bump in leukocytosis to 15.5K; likely reactive from OR Hgb remains stable at 8.0 Renal function normal; sCr - 0.56; UO - 1200 ccs Bone Bx (11/30) with GPC Tissue Cx (11/30) with GPR, GNR, GPC Left Hip Cx (11/30) with GNR, GPC He continues on Vancomycin and Zosyn  Vital signs in last 24 hours: [min-max] current  Temp:  [96.9 F (36.1 C)-98.7 F (37.1 C)] 98.7 F (37.1 C) (12/01 0355) Pulse Rate:  [90-116] 90 (12/01 0355) Resp:  [15-19] 18 (12/01 0355) BP: (92-138)/(52-78) 99/66 (12/01 0355) SpO2:  [85 %-95 %] 95 % (12/01 0355)     Height: 5\' 10"  (177.8 cm) Weight: 59.5 kg BMI (Calculated): 18.82   Intake/Output last 2 shifts:  11/30 0701 - 12/01 0700 In: 1290 [P.O.:240; I.V.:700; IV Piggyback:350] Out: 1200 [Urine:1200]   Physical Exam:  Constitutional: alert, confused, no distress  HENT: normocephalic without obvious abnormality  Eyes: PERRL, EOM's grossly intact and symmetric  Respiratory: breathing non-labored at rest  Cardiovascular: regular rate and sinus rhythm  Integumentary: dressing to left hip, no saturation through  Labs:  CBC Latest Ref Rng & Units 10/08/2021 10/07/2021 10/06/2021  WBC 4.0 - 10.5 K/uL 15.5(H) 14.6(H) 13.6(H)  Hemoglobin 13.0 - 17.0 g/dL 8.0(L) 7.7(L) 7.1(L)  Hematocrit 39.0 - 52.0 % 24.7(L) 24.2(L) 22.9(L)  Platelets 150 - 400 K/uL 458(H) 379 454(H)   CMP Latest Ref Rng & Units 10/08/2021 10/08/2021 10/07/2021  Glucose 70 - 99 mg/dL 10/09/2021) - 86  BUN 8 - 23 mg/dL 10 - 7(L)  Creatinine 818(E - 1.24 mg/dL 9.93) 7.16(R) 6.78(L)  Sodium 135 - 145 mmol/L 131(L) - 131(L)  Potassium 3.5 - 5.1 mmol/L 4.1 - 3.4(L)  Chloride 98 - 111  mmol/L 95(L) - 97(L)  CO2 22 - 32 mmol/L 28 - 30  Calcium 8.9 - 10.3 mg/dL 7.8(L) - 7.8(L)  Total Protein 6.5 - 8.1 g/dL - - -  Total Bilirubin 0.3 - 1.2 mg/dL - - -  Alkaline Phos 38 - 126 U/L - - -  AST 15 - 41 U/L - - -  ALT 0 - 44 U/L - - -    Imaging studies: No new pertinent imaging studies   Assessment/Plan:  80 y.o. male 1 Day Post-Op s/p debridement of infected unstageable left hip wound, complicated by pertinent comorbidities including dementia.   - Okay for diet from surgical standpoint  - Wound Care: Dakin's moisten Kerlix, pack wounds, cover with ABD and tape, BID, can start this evening   - Continue IV Abx (Vancomycin/Zosyn); follow up Cx              - Continue frequent repositioning, pressure off-loading             - Further management per primary service; we will follow   All of the above findings and recommendations were discussed with the medical team  -- 70, PA-C [facility] 10/08/2021, 7:04 AM [phone] M-F: 7am - 4pm

## 2021-10-08 NOTE — Op Note (Signed)
  Procedure Date:  10/08/2021  Pre-operative Diagnosis:  Unstageable decubitus wound of the left hip and left ischium.  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  Total area debrided = 273 cm2.  Surgeon:  Howie Ill, MD  Anesthesia:  General endotracheal  Estimated Blood Loss:  10 ml  Specimens:   Culture swab of purulent fluid Necrotic soft tissue for culture Left greater trochanter bone culture  Complications:  None  Findings:  The left hip wound edges measured 12 x 9 cm after final debridement, but inside cavity measured 18 x 14 cm due to inferior tracking under the skin.  The left ischial wound edges measured 6 x 3.5 cm after final debridement, without any tracking or undermining.  Indications for Procedure:  This is a 80 y.o. male with diagnosis of unstageable decubitus wound of the left hip and left ischium, requiring debridement.  The risks of bleeding, abscess or infection, injury to surrounding structures, and need for further procedures were all discussed with the patient and was willing to proceed.  Description of Procedure: The patient was correctly identified in the preoperative area and brought into the operating room.  The patient was placed supine with VTE prophylaxis in place.  Appropriate time-outs were performed.  Anesthesia was induced and the patient was intubated.  Appropriate antibiotics were infused.  The patient was placed in right lateral decubitus position.  The patient's left hip, buttocks, and perineum were prepped and draped in usual sterile fashion.  The patient's left hip wound had a significant amount of necrotic skin and soft tissue on the inferior and posterior aspects.  Cautery was used to start debridement of all the necrotic tissue.  The  debridement involved skin and subcutaneous tissue, with minimal debridement of muscular edge and ligament.  The total area debrided for the left hip wound was 18x14 = 252 cm2.  There was some purulent fluid which was swabbed and sent for cultures, and also some necrotic tissue was sent for tissue culture.  The area of infection reached the bone of the left greater trochanter, and a Ranjeur was used to take a specimen of bone for cultures.  After that, the left ischial wound was also debrided using cautery, and the total area debrided for the left ischial wound was 6x3.5 = 21 cm2.  Both wounds were thoroughly irrigated with the pulse jet lavage.  Cautery was used to get hemostasis.     The wound were then packed with wet to dry Kerlix roll with Dakins solution and covered with dry gauze, ABD pads, and tape.  The patient was then emerged from anesthesia, extubated, and brought to the recovery room for further management.  The patient tolerated the procedure well and all counts were correct at the end of the case.   Howie Ill, MD

## 2021-10-08 NOTE — Consult Note (Signed)
NAME: Aaron Cain  DOB: Mar 31, 1941  MRN: [id number]  Date/Time: 10/08/2021 5:20 PM  REQUESTING PROVIDER: Dr. Allena Katz Subjective:  REASON FOR CONSULT: Left hip pressure wound. ? Aaron Cain is a 80 y.o. male with a history of schizophrenia, dementia decreased mobility bedbound or wheelchair-bound was brought in from a skilled nursing facility on 10/05/2021 with worsening left hip pressure wound. He is not able to give much history even though he is talkative. In the ED BP was 133/78, temperature 97.9, pulse 106, sats 87%. WBC was 21.7, Hb 8.1, platelet 485 and creatinine 0.64.  Blood culture sent.  There was an unstageable wound noted on the left hip with necrosis.  An x-ray did not show any hip fracture or dislocation. Chest x-ray was normal.  He was started on vancomycin and Zosyn.  He was seen by the surgeon And taken for debridement on 10/06/2021.  Both the left hip and the left ischium wounds were debrided.and purulent fluid and necrotic soft tissue was sent for culture.  I am asked to see the patient for antibiotic management of these wounds. Past Medical History:  Diagnosis Date   Alzheimer disease (HCC)    COPD (chronic obstructive pulmonary disease) (HCC)    Drug abuse (HCC)    GERD (gastroesophageal reflux disease)    Osteoporosis    Parkinson disease (HCC)    Schizophrenia (HCC)   Multiple hospitalizations to the psychiatric unit in the past at [facility]  subdural hematoma in 2015 secondary to fall T3 vertebral body compression deformity  Past Surgical History:  Procedure Laterality Date   INCISION AND DRAINAGE ABSCESS Left 10/07/2021   Procedure: INCISION AND DRAINAGE ABSCESS LEFT HIP WOUND;  Surgeon: Henrene Dodge, MD;  Location: [facility] ORS;  Service: General;  Laterality: Left;    Social History   Socioeconomic History   Marital status: Single    Spouse name: Not on file   Number of children: Not on file   Years of education: Not on file   Highest education level:  Not on file  Occupational History   Not on file  Tobacco Use   Smoking status: Former   Smokeless tobacco: Never  Vaping Use   Vaping Use: Never used  Substance and Sexual Activity   Alcohol use: Not Currently   Drug use: Not Currently   Sexual activity: Not Currently  Other Topics Concern   Not on file  Social History Narrative   Not on file   Social Determinants of Health   Financial Resource Strain: Not on file  Food Insecurity: Not on file  Transportation Needs: Not on file  Physical Activity: Not on file  Stress: Not on file  Social Connections: Not on file  Intimate Partner Violence: Not on file    No family history on file. Allergies  Allergen Reactions   Penicillins Rash   Sulfa Antibiotics Rash   I? Current Facility-Administered Medications  Medication Dose Route Frequency Provider Last Rate Last Admin   acetaminophen (TYLENOL) tablet 650 mg  650 mg Oral Q6H PRN Piscoya, Jose, MD       Or   acetaminophen (TYLENOL) suppository 650 mg  650 mg Rectal Q6H PRN Piscoya, Jose, MD       ascorbic acid (VITAMIN C) tablet 500 mg  500 mg Oral BID Piscoya, Jose, MD   500 mg at 10/08/21 0834   Chlorhexidine Gluconate Cloth 2 % PADS 6 each  6 each Topical Q0600 Henrene Dodge, MD   6 each at 10/08/21  0520   divalproex (DEPAKOTE) DR tablet 125 mg  125 mg Oral BID Enedina Finner, MD       enoxaparin (LOVENOX) injection 40 mg  40 mg Subcutaneous Q24H Piscoya, Jose, MD   40 mg at 10/08/21 1610   feeding supplement (ENSURE ENLIVE / ENSURE PLUS) liquid 237 mL  237 mL Oral TID BM Piscoya, Jose, MD   237 mL at 10/08/21 1433   HYDROcodone-acetaminophen (NORCO/VICODIN) 5-325 MG per tablet 1-2 tablet  1-2 tablet Oral Q4H PRN Henrene Dodge, MD   2 tablet at 10/08/21 1443   melatonin tablet 5 mg  5 mg Oral QHS Enedina Finner, MD       mirtazapine (REMERON) tablet 15 mg  15 mg Oral QHS Enedina Finner, MD       morphine 2 MG/ML injection 2 mg  2 mg Intravenous Q2H PRN Piscoya, Jose, MD   2 mg at  10/07/21 1502   multivitamin with minerals tablet 1 tablet  1 tablet Oral Daily Piscoya, Elita Quick, MD   1 tablet at 10/08/21 0834   mupirocin ointment (BACTROBAN) 2 % 1 application  1 application Nasal BID Henrene Dodge, MD   1 application at 10/08/21 0845   OLANZapine (ZYPREXA) tablet 7.5 mg  7.5 mg Oral QHS Enedina Finner, MD       ondansetron [facility]) tablet 4 mg  4 mg Oral Q6H PRN Piscoya, Jose, MD       Or   ondansetron (ZOFRAN) injection 4 mg  4 mg Intravenous Q6H PRN Piscoya, Jose, MD       piperacillin-tazobactam (ZOSYN) IVPB 3.375 g  3.375 g Intravenous Q8H Piscoya, Jose, MD 12.5 mL/hr at 10/08/21 1437 3.375 g at 10/08/21 1437   sodium hypochlorite (DAKIN'S 1/4 STRENGTH) topical solution   Irrigation BID Henrene Dodge, MD   Given at 10/08/21 0413   vancomycin (VANCOREADY) IVPB 1250 mg/250 mL  1,250 mg Intravenous Q24H Piscoya, Jose, MD 166.7 mL/hr at 10/08/21 1306 1,250 mg at 10/08/21 1306     Abtx:  Anti-infectives (From admission, onward)    Start     Dose/Rate Route Frequency Ordered Stop   10/06/21 1400  vancomycin (VANCOREADY) IVPB 1250 mg/250 mL        1,250 mg 166.7 mL/hr over 90 Minutes Intravenous Every 24 hours 10/06/21 0608 10/13/21 1359   10/06/21 0927  piperacillin-tazobactam (ZOSYN) 3.375 GM/50ML IVPB       Note to Pharmacy: Jackquline Bosch S: cabinet override      10/06/21 0927 10/06/21 2129   10/06/21 0615  piperacillin-tazobactam (ZOSYN) IVPB 3.375 g        3.375 g 12.5 mL/hr over 240 Minutes Intravenous Every 8 hours 10/06/21 0600     10/06/21 0106  vancomycin (VANCOCIN) 1-5 GM/200ML-% IVPB       Note to Pharmacy: Landry Dyke V: cabinet override      10/06/21 0106 10/06/21 1314   10/06/21 0105  piperacillin-tazobactam (ZOSYN) 3.375 GM/50ML IVPB       Note to Pharmacy: Landry Dyke V: cabinet override      10/06/21 0105 10/06/21 1314   10/06/21 0045  vancomycin (VANCOCIN) IVPB 1000 mg/200 mL premix        1,000 mg 200 mL/hr over 60 Minutes Intravenous  Once  10/06/21 0041 10/06/21 0252   10/06/21 0045  piperacillin-tazobactam (ZOSYN) IVPB 3.375 g        3.375 g 100 mL/hr over 30 Minutes Intravenous  Once 10/06/21 0041 10/06/21 0142       REVIEW  OF SYSTEMS: Patient is a bit unable to give a review of system. Objective:  VITALS:  BP 134/75 (BP Location: Left Arm)   Pulse 93   Temp 98.5 F (36.9 C)   Resp 16   Ht 5\' 10"  (1.778 m)   Wt 59.5 kg   SpO2 95%   BMI 18.82 kg/m  PHYSICAL EXAM:  General: Awake, emaciated, flexion contracture, frail and pale Head: Normocephalic, without obvious abnormality, atraumatic. Eyes: Conjunctivae clear, anicteric sclerae. Pupils are equal ENT cannot examine in detail.  He is edentulous. . Back: No CVA tenderness. Lungs: Bilateral air entry. Heart: Regular rate and rhythm, no murmur, rub or gallop. Abdomen: Soft, non-tender,not distended. Bowel sounds normal. 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  The picture below was done taken before he had the debridement Now the necrotic tissue has been removed.    Lymph: Cervical, supraclavicular normal. Neurologic: Cannot assess as he is contracted He is alert and oriented  Pertinent Labs Lab Results CBC    Component Value Date/Time   WBC 15.5 (H) 10/08/2021 0521   RBC 2.67 (L) 10/08/2021 0521   HGB 8.0 (L) 10/08/2021 0521   HGB 13.4 04/17/2014 0718   HCT 24.7 (L) 10/08/2021 0521   HCT 40.8 04/17/2014 0718   PLT 458 (H) 10/08/2021 0521   PLT 267 04/17/2014 0718   MCV 92.5 10/08/2021 0521   MCV 94 04/17/2014 0718   MCH 30.0 10/08/2021 0521   MCHC 32.4 10/08/2021 0521   RDW 14.3 10/08/2021 0521   RDW 13.1 04/17/2014 0718   LYMPHSABS 1.0 10/05/2021 1047   LYMPHSABS 2.5 04/17/2014 0718   MONOABS 1.3 (H) 10/05/2021 1047   MONOABS 0.5 04/17/2014 0718   EOSABS 0.0 10/05/2021 1047   EOSABS 0.1 04/17/2014 0718   BASOSABS 0.0 10/05/2021 1047   BASOSABS 0.1 04/17/2014 0718    CMP Latest Ref  Rng & Units 10/08/2021 10/08/2021 10/07/2021  Glucose 70 - 99 mg/dL 10/09/2021) - 86  BUN 8 - 23 mg/dL 10 - 7(L)  Creatinine 161(W - 1.24 mg/dL 9.60) 4.54(U) 9.81(X)  Sodium 135 - 145 mmol/L 131(L) - 131(L)  Potassium 3.5 - 5.1 mmol/L 4.1 - 3.4(L)  Chloride 98 - 111 mmol/L 95(L) - 97(L)  CO2 22 - 32 mmol/L 28 - 30  Calcium 8.9 - 10.3 mg/dL 7.8(L) - 7.8(L)  Total Protein 6.5 - 8.1 g/dL - - -  Total Bilirubin 0.3 - 1.2 mg/dL - - -  Alkaline Phos 38 - 126 U/L - - -  AST 15 - 41 U/L - - -  ALT 0 - 44 U/L - - -      Microbiology: Recent Results (from the past 240 hour(s))  Blood culture (routine x 2)     Status: None (Preliminary result)   Collection Time: 10/05/21 10:47 AM   Specimen: BLOOD  Result Value Ref Range Status   Specimen Description BLOOD LEFT ANTECUBITAL  Final   Special Requests   Final    BOTTLES DRAWN AEROBIC AND ANAEROBIC Blood Culture adequate volume   Culture   Final    NO GROWTH 3 DAYS Performed at [facility], [address] [place], [place]    Report Status PENDING  Incomplete  Blood culture (routine x 2)     Status: None (Preliminary result)   Collection Time: 10/05/21  1:53 PM   Specimen: BLOOD  Result Value Ref Range Status   Specimen Description  BLOOD BLOOD RIGHT HAND  Final   Special Requests   Final    BOTTLES DRAWN AEROBIC ONLY Blood Culture results may not be optimal due to an inadequate volume of blood received in culture bottles   Culture   Final    NO GROWTH 3 DAYS Performed at [facility], [address] [place], [place] [address]    Report Status PENDING  Incomplete  Resp Panel by RT-PCR (Flu A&B, Covid) Nasopharyngeal Swab     Status: None   Collection Time: 10/06/21 12:33 AM   Specimen: Nasopharyngeal Swab; Nasopharyngeal(NP) swabs in vial transport medium  Result Value Ref Range Status   SARS Coronavirus 2 by RT PCR NEGATIVE NEGATIVE Final    Comment: (NOTE) SARS-CoV-2 target nucleic acids are NOT  DETECTED.  The SARS-CoV-2 RNA is generally detectable in upper respiratory specimens during the acute phase of infection. The lowest concentration of SARS-CoV-2 viral copies this assay can detect is 138 copies/mL. A negative result does not preclude SARS-Cov-2 infection and should not be used as the sole basis for treatment or other patient management decisions. A negative result may occur with  improper specimen collection/handling, submission of specimen other than nasopharyngeal swab, presence of viral mutation(s) within the areas targeted by this assay, and inadequate number of viral copies(<138 copies/mL). A negative result must be combined with clinical observations, patient history, and epidemiological information. The expected result is Negative.  Fact Sheet for Patients:  BloggerCourse.com  Fact Sheet for Healthcare Providers:  SeriousBroker.it  This test is no t yet approved or cleared by the [place] FDA and  has been authorized for detection and/or diagnosis of SARS-CoV-2 by FDA under an Emergency Use Authorization (EUA). This EUA will remain  in effect (meaning this test can be used) for the duration of the COVID-19 declaration under Section 564(b)(1) of the Act, 21 U.S.C.section 360bbb-3(b)(1), unless the authorization is terminated  or revoked sooner.       Influenza A by PCR NEGATIVE NEGATIVE Final   Influenza B by PCR NEGATIVE NEGATIVE Final    Comment: (NOTE) The Xpert Xpress SARS-CoV-2/FLU/RSV plus assay is intended as an aid in the diagnosis of influenza from Nasopharyngeal swab specimens and should not be used as a sole basis for treatment. Nasal washings and aspirates are unacceptable for Xpert Xpress SARS-CoV-2/FLU/RSV testing.  Fact Sheet for Patients: BloggerCourse.com  Fact Sheet for Healthcare Providers: SeriousBroker.it  This test is not yet  approved or cleared by the [place] FDA and has been authorized for detection and/or diagnosis of SARS-CoV-2 by FDA under an Emergency Use Authorization (EUA). This EUA will remain in effect (meaning this test can be used) for the duration of the COVID-19 declaration under Section 564(b)(1) of the Act, 21 U.S.C. section 360bbb-3(b)(1), unless the authorization is terminated or revoked.  Performed at [facility], [address] [place], [place] [address]   Aerobic/Anaerobic Culture w Gram Stain (surgical/deep wound)     Status: None (Preliminary result)   Collection Time: 10/06/21 12:51 AM   Specimen: Leg  Result Value Ref Range Status   Specimen Description   Final    LEG LEFT Performed at [facility], [address] [place], [place] [address]    Special Requests   Final    NONE Performed at [facility], [address] [place], [place] [address]    Gram Stain   Final    MODERATE WBC PRESENT, PREDOMINANTLY PMN MODERATE GRAM POSITIVE COCCI IN PAIRS MODERATE  GRAM NEGATIVE COCCOBACILLI    Culture   Final    FEW PROTEUS MIRABILIS CULTURE REINCUBATED FOR BETTER GROWTH HOLDING FOR POSSIBLE ANAEROBE Performed at [facility], [address], [place], [place] [address]    Report Status PENDING  Incomplete   Organism ID, Bacteria PROTEUS MIRABILIS  Final      Susceptibility   Proteus mirabilis - MIC*    AMPICILLIN <=2 SENSITIVE Sensitive     CEFAZOLIN <=4 SENSITIVE Sensitive     CEFEPIME <=0.12 SENSITIVE Sensitive     CEFTAZIDIME <=1 SENSITIVE Sensitive     CEFTRIAXONE <=0.25 SENSITIVE Sensitive     CIPROFLOXACIN <=0.25 SENSITIVE Sensitive     GENTAMICIN <=1 SENSITIVE Sensitive     IMIPENEM 2 SENSITIVE Sensitive     TRIMETH/SULFA <=20 SENSITIVE Sensitive     AMPICILLIN/SULBACTAM <=2 SENSITIVE Sensitive     PIP/TAZO <=4 SENSITIVE Sensitive     * FEW PROTEUS MIRABILIS  Surgical pcr screen     Status: Abnormal   Collection Time:  10/07/21  4:16 PM   Specimen: Nasal Mucosa; Nasal Swab  Result Value Ref Range Status   MRSA, PCR POSITIVE (A) NEGATIVE Final    Comment: RESULT CALLED TO, READ BACK BY AND VERIFIED WITH: KRISTY DAVENPORT @1831  10/07/21 LFD    Staphylococcus aureus POSITIVE (A) NEGATIVE Final    Comment: RESULT CALLED TO, READ BACK BY AND VERIFIED WITH: KRISTY DAVENPORT @1831  10/07/21 LFD (NOTE) The Xpert SA Assay (FDA approved for NASAL specimens in patients 36 years of age and older), is one component of a comprehensive surveillance program. It is not intended to diagnose infection nor to guide or monitor treatment. Performed at [facility], [address] [place], [address] [place]   Aerobic/Anaerobic Culture w Gram Stain (surgical/deep wound)     Status: None (Preliminary result)   Collection Time: 10/07/21  9:20 PM   Specimen: PATH Bone biopsy; Tissue  Result Value Ref Range Status   Specimen Description   Final    BONE BX Performed at [facility], [address] [place], [address] [place]    Special Requests   Final    NONE Performed at [facility], [address] [place], [address] [place]    Gram Stain   Final    NO SQUAMOUS EPITHELIAL CELLS SEEN FEW WBC SEEN FEW GRAM POSITIVE COCCI Performed at [facility], [address], [place], [address] [place]    Culture PENDING  Incomplete   Report Status PENDING  Incomplete  Aerobic/Anaerobic Culture w Gram Stain (surgical/deep wound)     Status: None (Preliminary result)   Collection Time: 10/07/21  9:23 PM   Specimen: PATH Other; Wound  Result Value Ref Range Status   Specimen Description   Final    TISSUE Performed at [facility], [address] [place], [address] [place]    Special Requests   Final    NONE Performed at [facility], [address] [place], [address] [place]    Gram Stain   Final    NO SQUAMOUS EPITHELIAL CELLS SEEN FEW WBC SEEN FEW GRAM POSITIVE  RODS FEW GRAM NEGATIVE RODS FEW GRAM POSITIVE COCCI Performed at [facility], [address], [place], [address] [place]    Culture PENDING  Incomplete   Report Status PENDING  Incomplete  Aerobic/Anaerobic Culture w Gram Stain (surgical/deep wound)     Status: None (Preliminary result)   Collection Time: 10/07/21  9:48 PM  Specimen: Joint, Hip  Result Value Ref Range Status   Specimen Description HIP  Final   Special Requests NONE  Final   Gram Stain   Final    NO SQUAMOUS EPITHELIAL CELLS SEEN RARE WBC SEEN FEW GRAM NEGATIVE RODS MODERATE GRAM POSITIVE COCCI Performed at [facility], [address] [place], [place] [address]    Culture PENDING  Incomplete   Report Status PENDING  Incomplete    IMAGING RESULTS: NO hip fracture or dislocation. Chest x-ray was normaL I have personally reviewed the films ? Impression/Recommendation ? 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  status postdebridement on 10/06/2021 Left ischial wound status postdebridement Patient is currently on vancomycin and Zosyn. Cultures are pending Chance of wound healing or negligible because of immobility, severe malnutrition, anemia and multiple comorbidities.  Acute on chronic anemia  Severe protein calorie malnutrition Bedbound and wheelchair-bound Schizophrenia Dementia Frailty  Recommend palliative consult to address treatment goals. _____________________________________ discussed the management with his nurse. Note:  This document was prepared using Dragon voice recognition software and may include unintentional dictation errors.

## 2021-10-08 NOTE — Plan of Care (Signed)
  Problem: Education: Goal: Knowledge of General Education information will improve Description: Including pain rating scale, medication(s)/side effects and non-pharmacologic comfort measures Outcome: Progressing   Problem: Health Behavior/Discharge Planning: Goal: Ability to manage health-related needs will improve Outcome: Progressing   Problem: Clinical Measurements: Goal: Ability to maintain clinical measurements within normal limits will improve Outcome: Progressing Goal: Will remain free from infection Outcome: Progressing   Problem: Nutrition: Goal: Adequate nutrition will be maintained Outcome: Progressing   Problem: Pain Managment: Goal: General experience of comfort will improve Outcome: Progressing   Problem: Safety: Goal: Ability to remain free from injury will improve Outcome: Progressing   Problem: Skin Integrity: Goal: Risk for impaired skin integrity will decrease Outcome: Progressing   

## 2021-10-09 ENCOUNTER — Encounter: Payer: Self-pay | Admitting: Internal Medicine

## 2021-10-09 DIAGNOSIS — L089 Local infection of the skin and subcutaneous tissue, unspecified: Secondary | ICD-10-CM | POA: Diagnosis not present

## 2021-10-09 DIAGNOSIS — T148XXA Other injury of unspecified body region, initial encounter: Secondary | ICD-10-CM | POA: Diagnosis not present

## 2021-10-09 LAB — CBC
HCT: 27.3 % — ABNORMAL LOW (ref 39.0–52.0)
Hemoglobin: 8.4 g/dL — ABNORMAL LOW (ref 13.0–17.0)
MCH: 29.4 pg (ref 26.0–34.0)
MCHC: 30.8 g/dL (ref 30.0–36.0)
MCV: 95.5 fL (ref 80.0–100.0)
Platelets: 556 10*3/uL — ABNORMAL HIGH (ref 150–400)
RBC: 2.86 MIL/uL — ABNORMAL LOW (ref 4.22–5.81)
RDW: 14 % (ref 11.5–15.5)
WBC: 19.1 10*3/uL — ABNORMAL HIGH (ref 4.0–10.5)
nRBC: 0 % (ref 0.0–0.2)

## 2021-10-09 LAB — CREATININE, SERUM
Creatinine, Ser: 0.55 mg/dL — ABNORMAL LOW (ref 0.61–1.24)
GFR, Estimated: >60 mL/min (ref >60)

## 2021-10-09 MED ORDER — DOCUSATE SODIUM 100 MG PO CAPS
100.0000 mg | ORAL_CAPSULE | Freq: Every day | ORAL | Status: DC
  Administered 2021-10-09 – 2021-10-11 (×3): 100 mg via ORAL
  Filled 2021-10-09 (×4): qty 1

## 2021-10-09 MED ORDER — LORATADINE 10 MG PO TABS
10.0000 mg | ORAL_TABLET | Freq: Every day | ORAL | Status: DC
  Administered 2021-10-09 – 2021-10-12 (×4): 10 mg via ORAL
  Filled 2021-10-09 (×4): qty 1

## 2021-10-09 MED ORDER — POLYETHYLENE GLYCOL 3350 17 G PO PACK
17.0000 g | PACK | Freq: Two times a day (BID) | ORAL | Status: DC
  Administered 2021-10-09 – 2021-10-12 (×8): 17 g via ORAL
  Filled 2021-10-09 (×8): qty 1

## 2021-10-09 MED ORDER — OYSTER SHELL CALCIUM/D3 500-5 MG-MCG PO TABS
1.0000 | ORAL_TABLET | Freq: Two times a day (BID) | ORAL | Status: DC
Start: 2021-10-09 — End: 2021-10-13
  Administered 2021-10-09 – 2021-10-12 (×8): 1 via ORAL
  Filled 2021-10-09 (×8): qty 1

## 2021-10-09 MED ORDER — PANTOPRAZOLE SODIUM 40 MG PO TBEC
40.0000 mg | DELAYED_RELEASE_TABLET | Freq: Every day | ORAL | Status: DC
  Administered 2021-10-09 – 2021-10-12 (×4): 40 mg via ORAL
  Filled 2021-10-09 (×4): qty 1

## 2021-10-09 MED ORDER — MORPHINE SULFATE (PF) 2 MG/ML IV SOLN: 2.0000 mg | INTRAVENOUS | Status: DC | PRN

## 2021-10-09 NOTE — Progress Notes (Signed)
10/09/2021  Subjective: Patient is 2 Days Post-Op s/p debridement of left hip and left ischial decubitus wounds.  No acute events.  Patient's WBC is more elevated today to 19.1.    Vital signs: Temp:  [97.8 F (36.6 C)-98.5 F (36.9 C)] 97.9 F (36.6 C) (12/02 1118) Pulse Rate:  [76-101] 101 (12/02 1118) Resp:  [16-20] 18 (12/02 1118) BP: (102-134)/(53-75) 117/53 (12/02 1118) SpO2:  [90 %-99 %] 90 % (12/02 1118)   Intake/Output: 12/01 0701 - 12/02 0700 In: 1558.4 [P.O.:960; IV Piggyback:598.4] Out: 1100 [Urine:1100] Last BM Date: 10/09/21  Physical Exam: Constitutional:  No acute distress Skin:  The wound was evaluated today together with Ms. Engels with wound care.  There is no necrotic tissue or purulence on either wound.  She was successfully able to place a wound vac over both wounds, with attention to the undermining from the left hip wound inferiorly.    Labs:  Recent Labs    10/08/21 0521 10/09/21 0309  WBC 15.5* 19.1*  HGB 8.0* 8.4*  HCT 24.7* 27.3*  PLT 458* 556*   Recent Labs    10/07/21 0442 10/08/21 0521 10/09/21 0309  NA 131* 131*  --   K 3.4* 4.1  --   CL 97* 95*  --   CO2 30 28  --   GLUCOSE 86 114*  --   BUN 7* 10  --   CREATININE 0.36*  0.44* 0.56*  0.55* 0.55*  CALCIUM 7.8* 7.8*  --    No results for input(s): LABPROT, INR in the last 72 hours.  Imaging: No results found.  Assessment/Plan: This is a 80 y.o. male s/p debridement of left hip and ischial wounds.  --Initial superficial cultures from the ER are growing Proteus and E coli, same as had grown on cultures from 10/28 obtained at the wound care clinic.  Cultures obtained from the OR are still pending, particularly the bone culture. --For now, wound vac was applied successfully over the wound today.  All wounds are connected together and currently with good seal. --Will plan for wound vac change on Monday with WOC. --Discussed with Dr. Allena Katz and with Ms. Boswell to arrange home  health wound vac for discharge.  Currently is pending authorization which will happen likely on Monday.   Howie Ill, MD [facility]

## 2021-10-09 NOTE — Plan of Care (Signed)

## 2021-10-09 NOTE — Progress Notes (Signed)
[facility]  - [place] at [facility]   PATIENT NAME: Aaron Cain    MR#:  [id number]  DATE OF BIRTH:  August 31, 1941  SUBJECTIVE:  patient more awake and communicative Being fed lunch by NT Had wound vac placed by Dr Arnoldo Morale and WOC REVIEW OF SYSTEMS:   Review of Systems  Constitutional:  Negative for chills, fever and weight loss.  HENT:  Negative for ear discharge, ear pain and nosebleeds.   Eyes:  Negative for blurred vision, pain and discharge.  Respiratory:  Negative for sputum production, shortness of breath, wheezing and stridor.   Cardiovascular:  Negative for chest pain, palpitations, orthopnea and PND.  Gastrointestinal:  Negative for abdominal pain, diarrhea, nausea and vomiting.  Genitourinary:  Negative for frequency and urgency.  Musculoskeletal:  Negative for back pain and joint pain.  Neurological:  Positive for weakness. Negative for sensory change, speech change and focal weakness.  Psychiatric/Behavioral:  Negative for depression and hallucinations. The patient is not nervous/anxious.   Tolerating Diet:yes Tolerating PT:   DRUG ALLERGIES:   Allergies  Allergen Reactions  . Penicillins Rash  . Sulfa Antibiotics Rash    VITALS:  Blood pressure (!) 117/53, pulse (!) 101, temperature 97.9 F (36.6 C), temperature source Oral, resp. rate 18, height 5\' 10"  (1.778 m), weight 59.5 kg, SpO2 90 %.  PHYSICAL EXAMINATION:   Physical Exam  GENERAL:  80 y.o.-year-old patient lying in the bed with no acute distress. Thin cachectic HEENT: Head atraumatic, normocephalic. Oropharynx and nasopharynx clear.  LUNGS: Normal breath sounds bilaterally, no wheezing, rales, rhonchi. No use of accessory muscles of respiration.  CARDIOVASCULAR: S1, S2 normal. No murmurs, rubs, or gallops.  ABDOMEN: Soft, nontender, nondistended. Bowel sounds present. EXTREMITIES: No cyanosis, clubbing or edema b/l.    NEUROLOGIC: nonfocal PSYCHIATRIC:  patient is alert and  oriented x 3.  SKIN:   On 10/05/2021 (pre-op)  12/2--has wound vac  LABORATORY PANEL:  CBC Recent Labs  Lab 10/09/21 0309  WBC 19.1*  HGB 8.4*  HCT 27.3*  PLT 556*     Chemistries  Recent Labs  Lab 10/05/21 1047 10/06/21 0316 10/08/21 0521 10/09/21 0309  NA 136   < > 131*  --   K 3.6   < > 4.1  --   CL 101   < > 95*  --   CO2 28   < > 28  --   GLUCOSE 110*   < > 114*  --   BUN 16   < > 10  --   CREATININE 0.64   < > 0.56*  0.55* 0.55*  CALCIUM 8.4*   < > 7.8*  --   MG  --   --  2.0  --   AST 21  --   --   --   ALT 10  --   --   --   ALKPHOS 71  --   --   --   BILITOT 0.6  --   --   --    < > = values in this interval not displayed.    Cardiac Enzymes No results for input(s): TROPONINI in the last 168 hours. RADIOLOGY:  No results found. ASSESSMENT AND PLAN:  Aaron Cain is a 80 y.o. male with medical history significant for Dementia, COPD, schizophrenia, with multiple decubitus ulcers currently being treated at the wound care center s/p wound debridement on 11/10 who was brought to the ED with a complaint of foul odor and oozing  from the wound on his left hip.  Patient is mostly wheelchair and bedbound though he is able to pivot from bed to chair.    X-ray hip no acute osseous abnormality Chest x-ray clear  Infected decubitus ulcer of buttock/left hip, stage 3 -4 SIRS--tachycardia and elevated wbc - IV Zosyn and vancomycin - IV fluids - Surgical consult for debridement--dr piscoya to take pt to OR today --12/1--POD#1 Stage 4 decubitus left hip and Stage 3 decubitus wound of left ischium Debridement and drainage of left hip and left ischial  decubitus wounds by Dr Aleen Campi -- ID consult place for IV antibiotics --12/2-- wound VAC placed. --WC proteus, staph   Acute on chronic anemia --no active bleed reported - Hemoglobin 8.1-7.1--1 unit BT--7.7--8.0 - Suspect due to blood loss from chronic wounds   protein calorie malnutrition, severe -  appreciate dietitian consult     COPD (chronic obstructive pulmonary disease)  - DuoNebs as needed     Alzheimer disease (HCC)   Schizophrenia (HCC) - Continue home meds     DVT prophylaxis: Lovenox  Code Status: full code  Family Communication:  none  Disposition Plan: Back to previous home environment Consults called: Surgery Level of care: Med-Surg Status is: Inpatient  Remains inpatient appropriate because: infected hip wound TOC for discharge planning. Patient will need wound vac       TOTAL TIME TAKING CARE OF THIS PATIENT: 25 minutes.  >50% time spent on counselling and coordination of care  Note: This dictation was prepared with Dragon dictation along with smaller phrase technology. Any transcriptional errors that result from this process are unintentional.  Enedina Finner M.D    [facility]   CC: Primary care physician; Angela Cox, MD Patient ID: Aaron Cain, male   DOB: 03-Jul-1941, 80 y.o.   MRN: [id number]

## 2021-10-09 NOTE — TOC Progression Note (Addendum)
Transition of Care [facility]) - Progression Note    Patient Details  Name: Aaron Cain MRN: [id number] Date of Birth: [birth year]  Transition of Care [facility]) CM/SW Contact  Margarito Liner, LCSW Phone Number: 10/09/2021, 12:32 PM  Clinical Narrative: Ordered wound vac through [employer]. They will email order form to Dr. Aleen Campi. Patient will be here over the weekend. [facility] representative and Ardyth Gal at [facility] are aware.    1:21 pm: Per [employer], since patient has a [facility] plan they will have to get prior authorization. May be Monday before they get approval. Patient will also likely have a copay of a few hundred dollars but they will not know for sure until they get the authorization. Discussed with niece. She said she does not have his financial information and requested that CSW discuss with [place]. Leanne Chang said he has the funds to cover the copay. Notified niece and [employer] representative.  Expected Discharge Plan: Assisted Living (with home health) Barriers to Discharge: Continued Medical Work up  Expected Discharge Plan and Services Expected Discharge Plan: Assisted Living (with home health)     Post Acute Care Choice: Resumption of Svcs/PTA Provider Living arrangements for the past 2 months: Assisted Living Facility                           HH Arranged: RN [facility] Agency: [facility] Date [facility] Agency Contacted: 10/07/21   Representative spoke with at [facility] Agency: [facility]   Social Determinants of Health (SDOH) Interventions    Readmission Risk Interventions No flowsheet data found.

## 2021-10-09 NOTE — Progress Notes (Signed)
ID Pt in bed Curled up Frail Emaciated pale Patient Vitals for the past 24 hrs:  BP Temp Temp src Pulse Resp SpO2  10/09/21 1549 (!) 101/52 98 F (36.7 C) Oral (!) 105 18 93 %  10/09/21 1118 (!) 117/53 97.9 F (36.6 C) Oral (!) 101 18 90 %  10/09/21 0815 (!) 107/55 97.8 F (36.6 C) Oral 99 20 90 %  10/09/21 0351 124/69 98.3 F (36.8 C) -- 76 18 99 %  10/08/21 2018 (!) 102/53 98.3 F (36.8 C) Oral 81 17 97 %    Chest b/l air entry [id number] Left hip and left ischial wound now has wound vac  CNS cannot be assessed  Labs CBC Latest Ref Rng & Units 10/09/2021 10/08/2021 10/07/2021  WBC 4.0 - 10.5 K/uL 19.1(H) 15.5(H) 14.6(H)  Hemoglobin 13.0 - 17.0 g/dL 0.7(P) 7.1(G) 7.7(L)  Hematocrit 39.0 - 52.0 % 27.3(L) 24.7(L) 24.2(L)  Platelets 150 - 400 K/uL 556(H) 458(H) 379    CMP Latest Ref Rng & Units 10/09/2021 10/08/2021 10/08/2021  Glucose 70 - 99 mg/dL - 626(R) -  BUN 8 - 23 mg/dL - 10 -  Creatinine 4.85 - 1.24 mg/dL 4.62(V) 0.35(K) 0.93(G)  Sodium 135 - 145 mmol/L - 131(L) -  Potassium 3.5 - 5.1 mmol/L - 4.1 -  Chloride 98 - 111 mmol/L - 95(L) -  CO2 22 - 32 mmol/L - 28 -  Calcium 8.9 - 10.3 mg/dL - 7.8(L) -  Total Protein 6.5 - 8.1 g/dL - - -  Total Bilirubin 0.3 - 1.2 mg/dL - - -  Alkaline Phos 38 - 126 U/L - - -  AST 15 - 41 U/L - - -  ALT 0 - 44 U/L - - -    Micro 10/05/21- BC NG 10/06/21 Wound culture- proteus, ecoli, bacteroides, staph  Impression/recommendation  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

## 2021-10-09 NOTE — Consult Note (Signed)
Pharmacy Antibiotic Note  Aaron Cain is a 80 y.o. male with medical history including schizophrenia, dementia, decreased mobility admitted on 10/06/2021 with  left hip pressure wound . General surgery is following and patient is s/p debridement on 10/07/21. ID is following for antimicrobial management.  Pharmacy has been consulted for vancomycin and Zosyn dosing.  Plan:  Zosyn 3.375 g IV q8h (4-hr infusion)  Vancomycin 1250 mg IV q24h --Calculated AUC: 506.9, Cmin 10.7 --Daily Scr per protocol --Levels at steady state as clinically indicated (likely in next 1-2 days if therapy continued)  Height: 5\' 10"  (177.8 cm) Weight: 59.5 kg (131 lb 2.8 oz) IBW/kg (Calculated) : 73  Temp (24hrs), Avg:98.2 F (36.8 C), Min:97.8 F (36.6 C), Max:98.5 F (36.9 C)  Recent Labs  Lab 10/05/21 1047 10/05/21 1352 10/06/21 0316 10/07/21 0442 10/08/21 0521 10/09/21 0309  WBC 21.7*  --  13.6* 14.6* 15.5* 19.1*  CREATININE 0.64  --  0.47* 0.36*  0.44* 0.56*  0.55* 0.55*  LATICACIDVEN 1.7 1.5  --   --   --   --     Estimated Creatinine Clearance: 64 mL/min (A) (by C-G formula based on SCr of 0.55 mg/dL (L)).    Allergies  Allergen Reactions   Penicillins Rash   Sulfa Antibiotics Rash    Antimicrobials this admission: Zosyn 11/29 >>  Vancomycin 11/29 >>   Dose adjustments this admission: N/A  Microbiology results: 11/28 BCx: NGTD 11/29 Left leg Wcx: Proteus mirabilis (pan-sensitive), E coli (Amp R, Cipro I, Unasyn I), Staphylococcus aureus, holding for anaerobe 11/30 Tissue Cx: Polymicrobial on gram stain, pending 11/30 Bone Cx: GPC, pending 11/30 Hip Cx: Polymicrobial on gram stain, pending 11/30 MRSA PCR: (+)  Thank you for allowing pharmacy to be a part of this patient's care.  12/30 10/09/2021 10:03 AM

## 2021-10-09 NOTE — Consult Note (Addendum)
WOC Nurse Consult Note: Reason for Consult: Consult requested to apply Vac dressing to left ischium and hip wounds.  Assessed wound appearance with surgeon at the bedside. Pt had surgical debridement on 11/30 for these locations. Wound type: Let upper hip with Stage 4 pressure injury; 8X12X2cm, 25% exposed bone, 15% yellow, 60% red.  Lower portion of wound tracks to 6 cm with undermining and it communicates underneath the skin level with the middle hip wound.  Middle hip wound is also Stage 4 pressure injury; 1X1.5X.8cm, 85% red, 15% yellow Lower ischium with Stage 4 presure injury; 6X4.5X2cm, 30% red, 70% yellow Pressure Injury POA: Yes Dressing procedure/placement/frequency: Pt was medicated for pain prior to the procedure and applied one piece black sponge to eah wound (for  total of 3) and bridged together to one trackpad and machine at cont suction. Applied barrier ring around lower ischium wound to attempt to aintain a seal.  Pt tolerated with minimal amt discomfort.  WOC nurse will change left hip/ischium wounds Vac dressings Q M/W/F; use barrier ring around lower wound, tuck sponge into track from lower to middle wounds, bridge all 3 dressings together to one machine. Cammie Mcgee MSN, RN, CWOCN, [place], CNS [phone]

## 2021-10-09 NOTE — Care Management Important Message (Signed)
Important Message  Patient Details  Name: Aaron Cain MRN: [id number] Date of Birth: 1941-08-18   Medicare Important Message Given:  Yes     Johnell Comings 10/09/2021, 2:04 PM

## 2021-10-10 DIAGNOSIS — L089 Local infection of the skin and subcutaneous tissue, unspecified: Secondary | ICD-10-CM | POA: Diagnosis not present

## 2021-10-10 DIAGNOSIS — T148XXA Other injury of unspecified body region, initial encounter: Secondary | ICD-10-CM | POA: Diagnosis not present

## 2021-10-10 LAB — CULTURE, BLOOD (ROUTINE X 2)
Culture: NO GROWTH
Culture: NO GROWTH
Special Requests: ADEQUATE

## 2021-10-10 LAB — AEROBIC/ANAEROBIC CULTURE W GRAM STAIN (SURGICAL/DEEP WOUND)

## 2021-10-10 LAB — CREATININE, SERUM
Creatinine, Ser: 0.42 mg/dL — ABNORMAL LOW (ref 0.61–1.24)
GFR, Estimated: >60 mL/min (ref >60)

## 2021-10-10 LAB — VANCOMYCIN, PEAK: Vancomycin Pk: 24 ug/mL — ABNORMAL LOW (ref 30–40)

## 2021-10-10 NOTE — Progress Notes (Signed)
[facility]  - Aaron Cain at [facility]   PATIENT NAME: Aaron Cain    MR#:  [id number]  DATE OF BIRTH:  1941-01-28  SUBJECTIVE:  patient sleeping Had wound vac placed by Aaron Cain and Aaron Cain REVIEW OF SYSTEMS:   Review of Systems  Constitutional:  Negative for chills, fever and weight loss.  HENT:  Negative for ear discharge, ear pain and nosebleeds.   Eyes:  Negative for blurred vision, pain and discharge.  Respiratory:  Negative for sputum production, shortness of breath, wheezing and stridor.   Cardiovascular:  Negative for chest pain, palpitations, orthopnea and PND.  Gastrointestinal:  Negative for abdominal pain, diarrhea, nausea and vomiting.  Genitourinary:  Negative for frequency and urgency.  Musculoskeletal:  Negative for back pain and joint pain.  Neurological:  Positive for weakness. Negative for sensory change, speech change and focal weakness.  Psychiatric/Behavioral:  Negative for depression and hallucinations. The patient is not nervous/anxious.   Tolerating Diet:yes Tolerating PT: bedbound  DRUG ALLERGIES:   Allergies  Allergen Reactions  . Penicillins Rash  . Sulfa Antibiotics Rash    VITALS:  Blood pressure 129/74, pulse (!) 108, temperature 98.4 F (36.9 C), resp. rate 16, height 5\' 10"  (1.778 m), weight 59.5 kg, SpO2 95 %.  PHYSICAL EXAMINATION:   Physical Exam  GENERAL:  80 y.o.-year-old patient lying in the bed with no acute distress. Thin cachectic HEENT: Head atraumatic, normocephalic. Oropharynx and nasopharynx clear.  LUNGS: Normal breath sounds bilaterally, no wheezing, rales, rhonchi. No use of accessory muscles of respiration.  CARDIOVASCULAR: S1, S2 normal. No murmurs, rubs, or gallops.  ABDOMEN: Soft, nontender, nondistended. Bowel sounds present. EXTREMITIES: No cyanosis, clubbing or edema b/l.    NEUROLOGIC: nonfocal PSYCHIATRIC:  patient is alert and oriented x 3.  SKIN:   On 10/05/2021 (pre-op)  12/2--has wound  vac  LABORATORY PANEL:  CBC Recent Labs  Lab 10/09/21 0309  WBC 19.1*  HGB 8.4*  HCT 27.3*  PLT 556*     Chemistries  Recent Labs  Lab 10/05/21 1047 10/06/21 0316 10/08/21 0521 10/09/21 0309 10/10/21 0431  NA 136   < > 131*  --   --   K 3.6   < > 4.1  --   --   CL 101   < > 95*  --   --   CO2 28   < > 28  --   --   GLUCOSE 110*   < > 114*  --   --   BUN 16   < > 10  --   --   CREATININE 0.64   < > 0.56*  0.55*   < > 0.42*  CALCIUM 8.4*   < > 7.8*  --   --   MG  --   --  2.0  --   --   AST 21  --   --   --   --   ALT 10  --   --   --   --   ALKPHOS 71  --   --   --   --   BILITOT 0.6  --   --   --   --    < > = values in this interval not displayed.    Cardiac Enzymes No results for input(s): TROPONINI in the last 168 hours. RADIOLOGY:  No results found. ASSESSMENT AND PLAN:  Aaron Cain with medical history significant for Dementia, COPD, schizophrenia, with  multiple decubitus ulcers currently being treated at the wound care center s/p wound debridement on 11/10 who was brought to the ED with a complaint of foul odor and oozing from the wound on his left hip.  Patient is mostly wheelchair and bedbound though he is able to pivot from bed to chair.    X-ray hip no acute osseous abnormality Chest x-ray clear  Infected decubitus ulcer of buttock/left hip, stage 3 -4 SIRS--tachycardia and elevated wbc - IV Zosyn and vancomycin - IV fluids - Surgical consult for debridement--Aaron piscoya to take pt to OR today --12/1--POD#1 Stage 4 decubitus left hip and Stage 3 decubitus wound of left ischium Debridement and drainage of left hip and left ischial  decubitus wounds by Aaron Aaron Cain -- ID consult place for IV antibiotics --12/2-- wound VAC placed. --WC proteus, staph --12/3--no new issues   Acute on chronic anemia --no active bleed reported - Hemoglobin 8.1-7.1--1 unit BT--7.7--8.0 - Suspect due to blood loss from chronic wounds   protein  calorie malnutrition, severe - appreciate dietitian consult     COPD (chronic obstructive pulmonary disease)  - DuoNebs as needed     Alzheimer disease (HCC)   Schizophrenia (HCC) - Continue home meds     DVT prophylaxis: Lovenox  Code Status: full code  Family Communication:  none  Disposition Plan: Back to previous home environment Consults called: Surgery Level of care: Med-Surg Status is: Inpatient  Remains inpatient appropriate because: infected hip wound TOC for discharge planning. Patient will need wound vac       TOTAL TIME TAKING CARE OF THIS PATIENT: 25 minutes.  >50% time spent on counselling and coordination of care  Note: This dictation was prepared with Dragon dictation along with smaller phrase technology. Any transcriptional errors that result from this process are unintentional.  Aaron Cain M.D    [facility]   CC: Primary care physician; Aaron Cox, MD Patient ID: Aaron Cain, Cain   DOB: [birth year], 80 y.o.   MRN: [id number]

## 2021-10-10 NOTE — Progress Notes (Signed)
   10/10/21 1620  Assess: MEWS Score  Temp 98.3 F (36.8 C)  BP 128/73  Pulse Rate (!) 115  Resp 14  SpO2 98 %  O2 Device Nasal Cannula  O2 Flow Rate (L/min) 4 L/min  Assess: MEWS Score  MEWS Temp 0  MEWS Systolic 0  MEWS Pulse 2  MEWS RR 0  MEWS LOC 0  MEWS Score 2  MEWS Score Color Yellow  Assess: if the MEWS score is Yellow or Red  Were vital signs taken at a resting state? Yes  Focused Assessment No change from prior assessment  Does the patient meet 2 or more of the SIRS criteria? No  Does the patient have a confirmed or suspected source of infection? Yes  Provider and Rapid Response Notified? Yes (Dr. Allena Katz made aware)  MEWS guidelines implemented *See Row Information* Yes  Treat  MEWS Interventions Administered scheduled meds/treatments  Pain Scale Faces  Pain Score 0  Faces Pain Scale 0  Take Vital Signs  Increase Vital Sign Frequency  Yellow: Q 2hr X 2 then Q 4hr X 2, if remains yellow, continue Q 4hrs  Escalate  MEWS: Escalate Yellow: discuss with charge nurse/RN and consider discussing with provider and RRT  Notify: Charge Nurse/RN  Name of Charge Nurse/RN Notified L. Staton RN  Date Charge Nurse/RN Notified 10/10/21  Time Charge Nurse/RN Notified 1748  Notify: Provider  Provider Name/Title Dr. Enedina Finner MD  Date Provider Notified 10/10/21  Time Provider Notified [phone]  Notification Type Call  Notification Reason Other (Comment) (Yellow MEWS protocol)  Provider response No new orders  Date of Provider Response 10/10/21  Time of Provider Response 1816  Notify: Rapid Response  Name of Rapid Response RN Notified Not applicable  Document  Patient Outcome Other (Comment) (Patient has been stable, no complaints, no distress at this time.)  Progress note created (see row info) Yes  Assess: SIRS CRITERIA  SIRS Temperature  0  SIRS Pulse 1  SIRS Respirations  0  SIRS WBC 0  SIRS Score Sum  1

## 2021-10-10 NOTE — Progress Notes (Signed)
Patient ID: Aaron Cain, male   DOB: 01-12-1941, 80 y.o.   MRN: [id number]     SURGICAL PROGRESS NOTE   Hospital Day(s): 4.   Interval History: Patient seen and examined, no acute events or new complaints overnight. Patient lying doing in bed, alert, no distress.  Negative pressure dressing working adequately.  Vital signs in last 24 hours: [min-max] current  Temp:  [97.2 F (36.2 C)-98.2 F (36.8 C)] 98.2 F (36.8 C) (12/03 0514) Pulse Rate:  [101-108] 108 (12/03 0719) Resp:  [16-20] 16 (12/03 0719) BP: (101-129)/(52-74) 129/74 (12/03 0719) SpO2:  [90 %-96 %] 95 % (12/03 0719)     Height: 5\' 10"  (177.8 cm) Weight: 59.5 kg BMI (Calculated): 18.82   Physical Exam:  Constitutional: alert, no distress  Respiratory: breathing non-labored at rest  Cardiovascular: regular rate and sinus rhythm  Gastrointestinal: soft, non-tender, and non-distended Skin: Ulcers covered with negative pressure dressing.  Adequate suction.  Labs:  CBC Latest Ref Rng & Units 10/09/2021 10/08/2021 10/07/2021  WBC 4.0 - 10.5 K/uL 19.1(H) 15.5(H) 14.6(H)  Hemoglobin 13.0 - 17.0 g/dL 10/09/2021) 0.1(B) 7.7(L)  Hematocrit 39.0 - 52.0 % 27.3(L) 24.7(L) 24.2(L)  Platelets 150 - 400 K/uL 556(H) 458(H) 379   CMP Latest Ref Rng & Units 10/10/2021 10/09/2021 10/08/2021  Glucose 70 - 99 mg/dL - - 14/11/2020)  BUN 8 - 23 mg/dL - - 10  Creatinine 258(N - 1.24 mg/dL 2.77) 8.24(M) 3.53(I)  Sodium 135 - 145 mmol/L - - 131(L)  Potassium 3.5 - 5.1 mmol/L - - 4.1  Chloride 98 - 111 mmol/L - - 95(L)  CO2 22 - 32 mmol/L - - 28  Calcium 8.9 - 10.3 mg/dL - - 7.8(L)  Total Protein 6.5 - 8.1 g/dL - - -  Total Bilirubin 0.3 - 1.2 mg/dL - - -  Alkaline Phos 38 - 126 U/L - - -  AST 15 - 41 U/L - - -  ALT 0 - 44 U/L - - -    Imaging studies: No new pertinent imaging studies   Assessment/Plan:  80 y.o. male with notable pressure ulcers 3 Days Post-Op s/p cleansing and debridement, complicated by pertinent comorbidities including  dementia, COPD, schizophrenia.  Patient with negative pressure dressing placed yesterday.  Today the negative pressure dressing seems to be working adequately with adequate suction.  We will continue with plan of negative pressure dressing change on Monday.    Saturday, MD

## 2021-10-11 DIAGNOSIS — L089 Local infection of the skin and subcutaneous tissue, unspecified: Secondary | ICD-10-CM | POA: Diagnosis not present

## 2021-10-11 DIAGNOSIS — T148XXA Other injury of unspecified body region, initial encounter: Secondary | ICD-10-CM | POA: Diagnosis not present

## 2021-10-11 DIAGNOSIS — E440 Moderate protein-calorie malnutrition: Secondary | ICD-10-CM

## 2021-10-11 DIAGNOSIS — G309 Alzheimer's disease, unspecified: Secondary | ICD-10-CM

## 2021-10-11 DIAGNOSIS — F0280 Dementia in other diseases classified elsewhere without behavioral disturbance: Secondary | ICD-10-CM

## 2021-10-11 LAB — AEROBIC/ANAEROBIC CULTURE W GRAM STAIN (SURGICAL/DEEP WOUND)
Gram Stain: NONE SEEN
Gram Stain: NONE SEEN

## 2021-10-11 LAB — CBC
HCT: 28.1 % — ABNORMAL LOW (ref 39.0–52.0)
Hemoglobin: 8.8 g/dL — ABNORMAL LOW (ref 13.0–17.0)
MCH: 29.3 pg (ref 26.0–34.0)
MCHC: 31.3 g/dL (ref 30.0–36.0)
MCV: 93.7 fL (ref 80.0–100.0)
Platelets: 587 10*3/uL — ABNORMAL HIGH (ref 150–400)
RBC: 3 MIL/uL — ABNORMAL LOW (ref 4.22–5.81)
RDW: 14.5 % (ref 11.5–15.5)
WBC: 14.6 10*3/uL — ABNORMAL HIGH (ref 4.0–10.5)
nRBC: 0 % (ref 0.0–0.2)

## 2021-10-11 LAB — CREATININE, SERUM
Creatinine, Ser: 0.37 mg/dL — ABNORMAL LOW (ref 0.61–1.24)
GFR, Estimated: >60 mL/min (ref >60)

## 2021-10-11 LAB — LACTIC ACID, PLASMA
Lactic Acid, Venous: 1 mmol/L (ref 0.5–1.9)
Lactic Acid, Venous: 0.9 mmol/L (ref 0.5–1.9)

## 2021-10-11 LAB — VANCOMYCIN, TROUGH: Vancomycin Tr: 7 ug/mL — ABNORMAL LOW (ref 15–20)

## 2021-10-11 MED ORDER — VANCOMYCIN HCL 1500 MG/300ML IV SOLN
1500.0000 mg | INTRAVENOUS | Status: DC
  Filled 2021-10-11: qty 300

## 2021-10-11 MED ORDER — DOXYCYCLINE HYCLATE 100 MG PO TABS
100.0000 mg | ORAL_TABLET | Freq: Two times a day (BID) | ORAL | Status: DC
  Administered 2021-10-12 (×2): 100 mg via ORAL
  Filled 2021-10-11 (×2): qty 1

## 2021-10-11 MED ORDER — SODIUM CHLORIDE 0.9 % IV BOLUS
500.0000 mL | Freq: Once | INTRAVENOUS | Status: AC
  Administered 2021-10-11: 21:00:00 500 mL via INTRAVENOUS

## 2021-10-11 MED ORDER — ACETAMINOPHEN 325 MG PO TABS
325.0000 mg | ORAL_TABLET | Freq: Once | ORAL | Status: DC
  Filled 2021-10-11: qty 1

## 2021-10-11 MED ORDER — METRONIDAZOLE 500 MG PO TABS
500.0000 mg | ORAL_TABLET | Freq: Two times a day (BID) | ORAL | Status: DC
  Administered 2021-10-11 – 2021-10-12 (×3): 500 mg via ORAL
  Filled 2021-10-11 (×4): qty 1

## 2021-10-11 MED ORDER — CEFADROXIL 500 MG PO CAPS
500.0000 mg | ORAL_CAPSULE | Freq: Two times a day (BID) | ORAL | Status: DC
  Administered 2021-10-11 – 2021-10-12 (×3): 500 mg via ORAL
  Filled 2021-10-11 (×4): qty 1

## 2021-10-11 MED ORDER — VANCOMYCIN HCL 1500 MG/300ML IV SOLN
1500.0000 mg | INTRAVENOUS | Status: AC
  Administered 2021-10-11: 17:00:00 1500 mg via INTRAVENOUS
  Filled 2021-10-11: qty 300

## 2021-10-11 NOTE — Progress Notes (Signed)
[facility]  - [place] at [facility]   PATIENT NAME: Aaron Cain    MR#:  [id number]  DATE OF BIRTH:  [birth year]  SUBJECTIVE:  patient sipping on coke being fed by community friend Had wound vac placed post-op by [facility] REVIEW OF SYSTEMS:   Review of Systems  Constitutional:  Negative for chills, fever and weight loss.  HENT:  Negative for ear discharge, ear pain and nosebleeds.   Eyes:  Negative for blurred vision, pain and discharge.  Respiratory:  Negative for sputum production, shortness of breath, wheezing and stridor.   Cardiovascular:  Negative for chest pain, palpitations, orthopnea and PND.  Gastrointestinal:  Negative for abdominal pain, diarrhea, nausea and vomiting.  Genitourinary:  Negative for frequency and urgency.  Musculoskeletal:  Negative for back pain and joint pain.  Neurological:  Positive for weakness. Negative for sensory change, speech change and focal weakness.  Psychiatric/Behavioral:  Negative for depression and hallucinations. The patient is not nervous/anxious.   Tolerating Diet:yes Tolerating PT: bedbound  DRUG ALLERGIES:   Allergies  Allergen Reactions  . Penicillins Rash  . Sulfa Antibiotics Rash    VITALS:  Blood pressure 111/71, pulse (!) 105, temperature 98.4 F (36.9 C), temperature source Oral, resp. rate 16, height 5\' 10"  (1.778 m), weight 59.5 kg, SpO2 98 %.  PHYSICAL EXAMINATION:   Physical Exam  GENERAL:  80 y.o.-year-old patient lying in the bed with no acute distress. Thin cachectic HEENT: Head atraumatic, normocephalic. Oropharynx and nasopharynx clear.  LUNGS: Normal breath sounds bilaterally, no wheezing, rales, rhonchi. No use of accessory muscles of respiration.  CARDIOVASCULAR: S1, S2 normal. No murmurs, rubs, or gallops.  ABDOMEN: Soft, nontender, nondistended. Bowel sounds present. EXTREMITIES: No cyanosis, clubbing or edema b/l.    NEUROLOGIC: nonfocal PSYCHIATRIC:  patient is alert and oriented x 3.   SKIN:   On 10/05/2021 (pre-op)  12/2--has wound vac  LABORATORY PANEL:  CBC Recent Labs  Lab 10/09/21 0309  WBC 19.1*  HGB 8.4*  HCT 27.3*  PLT 556*     Chemistries  Recent Labs  Lab 10/05/21 1047 10/06/21 0316 10/08/21 0521 10/09/21 0309 10/11/21 0417  NA 136   < > 131*  --   --   K 3.6   < > 4.1  --   --   CL 101   < > 95*  --   --   CO2 28   < > 28  --   --   GLUCOSE 110*   < > 114*  --   --   BUN 16   < > 10  --   --   CREATININE 0.64   < > 0.56*  0.55*   < > 0.37*  CALCIUM 8.4*   < > 7.8*  --   --   MG  --   --  2.0  --   --   AST 21  --   --   --   --   ALT 10  --   --   --   --   ALKPHOS 71  --   --   --   --   BILITOT 0.6  --   --   --   --    < > = values in this interval not displayed.    Cardiac Enzymes No results for input(s): TROPONINI in the last 168 hours. RADIOLOGY:  No results found. ASSESSMENT AND PLAN:  Aaron Cain is a 80 y.o. male with  medical history significant for Dementia, COPD, schizophrenia, with multiple decubitus ulcers currently being treated at the wound care center s/p wound debridement on 11/10 who was brought to the ED with a complaint of foul odor and oozing from the wound on his left hip.  Patient is mostly wheelchair and bedbound though he is able to pivot from bed to chair.    X-ray hip no acute osseous abnormality Chest x-ray clear  Infected decubitus ulcer of buttock/left hip, stage 3 -4 SIRS--tachycardia and elevated wbc - IV Zosyn and vancomycin - IV fluids - Surgical consult for debridement--dr piscoya to take pt to OR today --12/1--POD#1 Stage 4 decubitus left hip and Stage 3 decubitus wound of left ischium Debridement and drainage of left hip and left ischial  decubitus wounds by Dr Aleen Campi -- ID consult place for IV antibiotics --12/2-- wound VAC placed. --WC proteus, staph --12/3--no new issues  --12/4--no issues. Will d/w family regarding how aggressive they would want to be given severe malnutrition  and wound healing  Acute on chronic anemia --no active bleed reported - Hemoglobin 8.1-7.1--1 unit BT--7.7--8.0 - Suspect due to blood loss from chronic wounds   protein calorie malnutrition, severe - appreciate dietitian consult     COPD (chronic obstructive pulmonary disease)  - DuoNebs as needed     Alzheimer disease (HCC)   Schizophrenia (HCC) - Continue home meds   Palliative care to see pt   DVT prophylaxis: Lovenox  Code Status: full code  Family Communication: Lana callaway Disposition Plan: Back to previous home environment Consults called: Surgery Level of care: Med-Surg Status is: Inpatient  Remains inpatient appropriate because: infected hip wound TOC for discharge planning. Patient will need wound vac   10/11/21-- spoke with patient's niece [name] on the phone. Since patient has overall a very poor prognosis given multiple medical issues including his severe malnourishment and chronic dementia. She is in agreement with no aggressive IV antibiotics. She wants to keep him as comfortable as possible. She is okay with oral antibiotics and wound VAC for now. She understands given his poor nutrition this infection is likely not going to be cured hundred percent. The extent of infection was severe and could've spread to the pelvic bone. This is aware.  Discussed code status and she says patient has a DNR. Will place DNR orders. Patient will return back to his facility.    TOTAL TIME TAKING CARE OF THIS PATIENT: 25 minutes.  >50% time spent on counselling and coordination of care  Note: This dictation was prepared with Dragon dictation along with smaller phrase technology. Any transcriptional errors that result from this process are unintentional.  Enedina Finner M.D    [facility]   CC: Primary care physician; Angela Cox, MD Patient ID: Aaron Cain, male   DOB: Jul 31, 1941, 80 y.o.   MRN: [id number]

## 2021-10-11 NOTE — Progress Notes (Signed)
ID No change in his status Pt in bed Curled up Frail Emaciated Patient Vitals for the past 24 hrs:  BP Temp Temp src Pulse Resp SpO2  10/11/21 0906 111/71 98.4 F (36.9 C) Oral (!) 105 16 98 %  10/11/21 0726 105/74 98.2 F (36.8 C) -- (!) 109 14 95 %  10/11/21 0502 119/65 (!) 97.5 F (36.4 C) Axillary (!) 112 (!) 22 95 %  10/11/21 0024 107/76 98.6 F (37 C) Oral (!) 117 (!) 24 96 %  10/10/21 2016 (!) 141/82 98.8 F (37.1 C) Oral (!) 118 20 93 %  10/10/21 1848 112/70 98.2 F (36.8 C) Oral (!) 115 16 98 %  10/10/21 1620 128/73 98.3 F (36.8 C) -- (!) 115 14 98 %    Chest b/l air entry [id number] Left hip and left ischial wound now has wound vac    CNS cannot be assessed  Labs CBC Latest Ref Rng & Units 10/09/2021 10/08/2021 10/07/2021  WBC 4.0 - 10.5 K/uL 19.1(H) 15.5(H) 14.6(H)  Hemoglobin 13.0 - 17.0 g/dL 2.4(O) 9.7(D) 7.7(L)  Hematocrit 39.0 - 52.0 % 27.3(L) 24.7(L) 24.2(L)  Platelets 150 - 400 K/uL 556(H) 458(H) 379    CMP Latest Ref Rng & Units 10/11/2021 10/10/2021 10/09/2021  Glucose 70 - 99 mg/dL - - -  BUN 8 - 23 mg/dL - - -  Creatinine 5.32 - 1.24 mg/dL 9.92(E) 2.68(T) 4.19(Q)  Sodium 135 - 145 mmol/L - - -  Potassium 3.5 - 5.1 mmol/L - - -  Chloride 98 - 111 mmol/L - - -  CO2 22 - 32 mmol/L - - -  Calcium 8.9 - 10.3 mg/dL - - -  Total Protein 6.5 - 8.1 g/dL - - -  Total Bilirubin 0.3 - 1.2 mg/dL - - -  Alkaline Phos 38 - 126 U/L - - -  AST 15 - 41 U/L - - -  ALT 0 - 44 U/L - - -    Micro 10/05/21- BC NG 10/06/21 Wound culture- proteus, ecoli, bacteroides, staph  Impression/recommendation  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  Will not treat the enterococcus raffinosus as PO linezolid more risk than benefit in his case and will not do IV vanco long term  Acute on chronic anemia Schizophrenia Dementia Old age frailty  Recommend palliative consult to address treatment goal.  I will be away tomorrow- call [facility] if needed

## 2021-10-11 NOTE — Progress Notes (Signed)
[facility]  - [place] at [facility]   PATIENT NAME: Aaron Cain    MR#:  [id number]  DATE OF BIRTH:  07-Nov-1941  SUBJECTIVE:  patient sleeping Had wound vac placed post-op by [facility] REVIEW OF SYSTEMS:   Review of Systems  Constitutional:  Negative for chills, fever and weight loss.  HENT:  Negative for ear discharge, ear pain and nosebleeds.   Eyes:  Negative for blurred vision, pain and discharge.  Respiratory:  Negative for sputum production, shortness of breath, wheezing and stridor.   Cardiovascular:  Negative for chest pain, palpitations, orthopnea and PND.  Gastrointestinal:  Negative for abdominal pain, diarrhea, nausea and vomiting.  Genitourinary:  Negative for frequency and urgency.  Musculoskeletal:  Negative for back pain and joint pain.  Neurological:  Positive for weakness. Negative for sensory change, speech change and focal weakness.  Psychiatric/Behavioral:  Negative for depression and hallucinations. The patient is not nervous/anxious.   Tolerating Diet:yes Tolerating PT: bedbound  DRUG ALLERGIES:   Allergies  Allergen Reactions  . Penicillins Rash  . Sulfa Antibiotics Rash    VITALS:  Blood pressure 111/71, pulse (!) 105, temperature 98.4 F (36.9 C), temperature source Oral, resp. rate 16, height 5\' 10"  (1.778 m), weight 59.5 kg, SpO2 98 %.  PHYSICAL EXAMINATION:   Physical Exam  GENERAL:  80 y.o.-year-old patient lying in the bed with no acute distress. Thin cachectic HEENT: Head atraumatic, normocephalic. Oropharynx and nasopharynx clear.  LUNGS: Normal breath sounds bilaterally, no wheezing, rales, rhonchi. No use of accessory muscles of respiration.  CARDIOVASCULAR: S1, S2 normal. No murmurs, rubs, or gallops.  ABDOMEN: Soft, nontender, nondistended. Bowel sounds present. EXTREMITIES: No cyanosis, clubbing or edema b/l.    NEUROLOGIC: nonfocal PSYCHIATRIC:  patient is alert and oriented x 3.  SKIN:   On 10/05/2021  (pre-op)  12/2--has wound vac  LABORATORY PANEL:  CBC Recent Labs  Lab 10/09/21 0309  WBC 19.1*  HGB 8.4*  HCT 27.3*  PLT 556*     Chemistries  Recent Labs  Lab 10/05/21 1047 10/06/21 0316 10/08/21 0521 10/09/21 0309 10/11/21 0417  NA 136   < > 131*  --   --   K 3.6   < > 4.1  --   --   CL 101   < > 95*  --   --   CO2 28   < > 28  --   --   GLUCOSE 110*   < > 114*  --   --   BUN 16   < > 10  --   --   CREATININE 0.64   < > 0.56*  0.55*   < > 0.37*  CALCIUM 8.4*   < > 7.8*  --   --   MG  --   --  2.0  --   --   AST 21  --   --   --   --   ALT 10  --   --   --   --   ALKPHOS 71  --   --   --   --   BILITOT 0.6  --   --   --   --    < > = values in this interval not displayed.    Cardiac Enzymes No results for input(s): TROPONINI in the last 168 hours. RADIOLOGY:  No results found. ASSESSMENT AND PLAN:  Aaron Cain is a 80 y.o. male with medical history significant for Dementia, COPD, schizophrenia,  with multiple decubitus ulcers currently being treated at the wound care center s/p wound debridement on 11/10 who was brought to the ED with a complaint of foul odor and oozing from the wound on his left hip.  Patient is mostly wheelchair and bedbound though he is able to pivot from bed to chair.    X-ray hip no acute osseous abnormality Chest x-ray clear  Infected decubitus ulcer of buttock/left hip, stage 3 -4 SIRS--tachycardia and elevated wbc - IV Zosyn and vancomycin - IV fluids - Surgical consult for debridement--dr piscoya to take pt to OR today --12/1--POD#1 Stage 4 decubitus left hip and Stage 3 decubitus wound of left ischium Debridement and drainage of left hip and left ischial  decubitus wounds by Dr Aleen Campi -- ID consult place for IV antibiotics --12/2-- wound VAC placed. --WC proteus, staph --12/3--no new issues  --12/4--no issues. Will d/w family regarding how aggressive they would want to be given severe malnutrition and wound  healing  Acute on chronic anemia --no active bleed reported - Hemoglobin 8.1-7.1--1 unit BT--7.7--8.0 - Suspect due to blood loss from chronic wounds   protein calorie malnutrition, severe - appreciate dietitian consult     COPD (chronic obstructive pulmonary disease)  - DuoNebs as needed     Alzheimer disease (HCC)   Schizophrenia (HCC) - Continue home meds   Palliative care to see pt   DVT prophylaxis: Lovenox  Code Status: full code  Family Communication:  none  Disposition Plan: Back to previous home environment Consults called: Surgery Level of care: Med-Surg Status is: Inpatient  Remains inpatient appropriate because: infected hip wound TOC for discharge planning. Patient will need wound vac       TOTAL TIME TAKING CARE OF THIS PATIENT: 25 minutes.  >50% time spent on counselling and coordination of care  Note: This dictation was prepared with Dragon dictation along with smaller phrase technology. Any transcriptional errors that result from this process are unintentional.  Enedina Finner M.D    [facility]   CC: Primary care physician; Angela Cox, MD Patient ID: Aaron Cain, male   DOB: [birth year], 80 y.o.   MRN: [id number]

## 2021-10-11 NOTE — Progress Notes (Signed)
Patient ID: Aaron Cain, male   DOB: 09/10/1941, 80 y.o.   MRN: [id number]     SURGICAL PROGRESS NOTE   Hospital Day(s): 5.   Interval History: Patient seen and examined, no acute events or new complaints overnight.  Patient alert without any distress.  Negative pressure in place with adequate suction.  Vital signs in last 24 hours: [min-max] current  Temp:  [97.5 F (36.4 C)-98.8 F (37.1 C)] 98.2 F (36.8 C) (12/04 0726) Pulse Rate:  [109-118] 109 (12/04 0726) Resp:  [14-24] 14 (12/04 0726) BP: (105-141)/(65-82) 105/74 (12/04 0726) SpO2:  [93 %-98 %] 95 % (12/04 0726)     Height: 5\' 10"  (177.8 cm) Weight: 59.5 kg BMI (Calculated): 18.82   Physical Exam:  Constitutional: alert, no distress  Respiratory: breathing non-labored at rest  Cardiovascular: Tachycardia Gastrointestinal: soft, non-tender, and non-distended Skin: Ulcers covered with negative pressure dressing.  Adequate suction  Labs:  CBC Latest Ref Rng & Units 10/09/2021 10/08/2021 10/07/2021  WBC 4.0 - 10.5 K/uL 19.1(H) 15.5(H) 14.6(H)  Hemoglobin 13.0 - 17.0 g/dL 10/09/2021) 0.9(T) 7.7(L)  Hematocrit 39.0 - 52.0 % 27.3(L) 24.7(L) 24.2(L)  Platelets 150 - 400 K/uL 556(H) 458(H) 379   CMP Latest Ref Rng & Units 10/11/2021 10/10/2021 10/09/2021  Glucose 70 - 99 mg/dL - - -  BUN 8 - 23 mg/dL - - -  Creatinine 14/12/2020 - 1.24 mg/dL 1.24) 5.80(D) 9.83(J)  Sodium 135 - 145 mmol/L - - -  Potassium 3.5 - 5.1 mmol/L - - -  Chloride 98 - 111 mmol/L - - -  CO2 22 - 32 mmol/L - - -  Calcium 8.9 - 10.3 mg/dL - - -  Total Protein 6.5 - 8.1 g/dL - - -  Total Bilirubin 0.3 - 1.2 mg/dL - - -  Alkaline Phos 38 - 126 U/L - - -  AST 15 - 41 U/L - - -  ALT 0 - 44 U/L - - -    Imaging studies: No new pertinent imaging studies   Assessment/Plan:  80 y.o. male with notable pressure ulcers 4 Days Post-Op s/p cleansing and debridement, complicated by pertinent comorbidities including dementia, COPD, schizophrenia.  Patient found stable.   No clinical duration.  Negative pressure dressing continue working adequately.  No issues with dysfunction.  We will continue with the plan of changing negative pressure nursing tomorrow.  72, MD

## 2021-10-11 NOTE — Plan of Care (Signed)
  Problem: Education: Goal: Knowledge of General Education information will improve Description: Including pain rating scale, medication(s)/side effects and non-pharmacologic comfort measures Outcome: Progressing   Problem: Clinical Measurements: Goal: Ability to maintain clinical measurements within normal limits will improve Outcome: Progressing   Problem: Clinical Measurements: Goal: Will remain free from infection Outcome: Progressing   Problem: Clinical Measurements: Goal: Respiratory complications will improve Outcome: Progressing   Problem: Nutrition: Goal: Adequate nutrition will be maintained Outcome: Progressing   Problem: Elimination: Goal: Will not experience complications related to bowel motility Outcome: Progressing   Problem: Elimination: Goal: Will not experience complications related to urinary retention Outcome: Progressing   Problem: Pain Managment: Goal: General experience of comfort will improve Outcome: Progressing   Problem: Safety: Goal: Ability to remain free from injury will improve Outcome: Progressing

## 2021-10-11 NOTE — Consult Note (Signed)
Pharmacy Antibiotic Note  Aaron Cain is a 80 y.o. male with medical history including schizophrenia, dementia, decreased mobility admitted on 10/06/2021 with  left hip pressure wound . General surgery is following and patient is s/p debridement on 10/07/21. ID is following for antimicrobial management.  Pharmacy has been consulted for vancomycin and Zosyn dosing.  Plan:  12/4: Vanc Dose given (12/03 1605) // Pk drawn (12/03 1917) // Tr drawn (12/04 1913)  Changed Vancomycin 1250mg  IV q24h >> 1500mg  IV q24h --Calculated AUC: 449.8, Cmax 31.4, Cmin 9.9 --Daily Scr per protocol  Zosyn discontinued per ID (12/4).  Height: 5\' 10"  (177.8 cm) Weight: 59.5 kg (131 lb 2.8 oz) IBW/kg (Calculated) : 73  Temp (24hrs), Avg:98.2 F (36.8 C), Min:97.5 F (36.4 C), Max:98.8 F (37.1 C)  Recent Labs  Lab 10/05/21 1047 10/05/21 1352 10/06/21 0316 10/07/21 0442 10/08/21 0521 10/09/21 0309 10/10/21 0431 10/10/21 1917 10/11/21 0417 10/11/21 1525  WBC 21.7*  --  13.6* 14.6* 15.5* 19.1*  --   --   --   --   CREATININE 0.64  --  0.47* 0.36*  0.44* 0.56*  0.55* 0.55* 0.42*  --  0.37*  --   LATICACIDVEN 1.7 1.5  --   --   --   --   --   --   --   --   VANCOTROUGH  --   --   --   --   --   --   --   --   --  7*  VANCOPEAK  --   --   --   --   --   --   --  24*  --   --      Estimated Creatinine Clearance: 64 mL/min (A) (by C-G formula based on SCr of 0.37 mg/dL (L)).    Allergies  Allergen Reactions   Penicillins Rash   Sulfa Antibiotics Rash    Antimicrobials this admission: Zosyn (11/29 - 12/4) Vancomycin (11/29 >>   Dose adjustments this admission: N/A  Microbiology results: 11/28 BCx: NGTD (final) 11/29 Left leg Wcx: Proteus mirabilis (pan-sensitive), E coli (Amp R, Cipro I, Unasyn I), Staphylococcus aureus (Amp R), holding for anaerobe 11/30 Tissue Cx: Polymicrobial on gram stain, Few Proteus Mirabalis, Abundant Bacteroides thetaiotaomicron, Abundant  Corynebacterium 11/30 Bone Cx: GPC, Enterococcus raffinosus (Vanc-S & AMP/Gent-R). RARE Bacteroides thetaiotaomicron B-lactamase positive 11/30 Hip Cx: Polymicrobial on gram stain, Bacteroides caccae, bacteroides thetaitaomicron B-lactamase positive 11/30 MRSA PCR: (+)  Thank you for allowing pharmacy to be a part of this patient's care.  12/30 Aaron Cain 10/11/2021 4:26 PM

## 2021-10-12 DIAGNOSIS — T148XXA Other injury of unspecified body region, initial encounter: Secondary | ICD-10-CM | POA: Diagnosis not present

## 2021-10-12 DIAGNOSIS — L089 Local infection of the skin and subcutaneous tissue, unspecified: Secondary | ICD-10-CM | POA: Diagnosis not present

## 2021-10-12 DIAGNOSIS — L89323 Pressure ulcer of left buttock, stage 3: Secondary | ICD-10-CM | POA: Diagnosis not present

## 2021-10-12 MED ORDER — DOXYCYCLINE HYCLATE 100 MG PO TABS: 100.0000 mg | ORAL_TABLET | Freq: Two times a day (BID) | ORAL | 0 refills | Status: DC

## 2021-10-12 MED ORDER — CEFADROXIL 500 MG PO CAPS: 500.0000 mg | ORAL_CAPSULE | Freq: Two times a day (BID) | ORAL | 0 refills | Status: DC

## 2021-10-12 MED ORDER — ADULT MULTIVITAMIN W/MINERALS CH: 1.0000 | ORAL_TABLET | Freq: Every day | ORAL | 0 refills | Status: AC

## 2021-10-12 MED ORDER — CEFADROXIL 500 MG PO CAPS: 500.0000 mg | ORAL_CAPSULE | Freq: Two times a day (BID) | ORAL | 0 refills | Status: AC

## 2021-10-12 MED ORDER — SODIUM CHLORIDE 0.9 % IV BOLUS
500.0000 mL | Freq: Once | INTRAVENOUS | Status: AC
Start: 2021-10-12 — End: 2021-10-12
  Administered 2021-10-12: 500 mL via INTRAVENOUS

## 2021-10-12 MED ORDER — MUPIROCIN 2 % EX OINT
1.0000 | TOPICAL_OINTMENT | Freq: Two times a day (BID) | CUTANEOUS | 0 refills | Status: AC
Start: 2021-10-12 — End: ?

## 2021-10-12 MED ORDER — ACETAMINOPHEN 325 MG RE SUPP
325.0000 mg | Freq: Once | RECTAL | Status: AC
  Administered 2021-10-12: 325 mg via RECTAL
  Filled 2021-10-12 (×2): qty 1

## 2021-10-12 MED ORDER — DOXYCYCLINE HYCLATE 100 MG PO TABS
100.0000 mg | ORAL_TABLET | Freq: Two times a day (BID) | ORAL | 0 refills | Status: AC
Start: 2021-10-12 — End: 2021-10-26

## 2021-10-12 MED ORDER — METRONIDAZOLE 500 MG PO TABS: 500.0000 mg | ORAL_TABLET | Freq: Two times a day (BID) | ORAL | 0 refills | Status: AC

## 2021-10-12 MED ORDER — METRONIDAZOLE 500 MG PO TABS: 500.0000 mg | ORAL_TABLET | Freq: Two times a day (BID) | ORAL | 0 refills | Status: DC

## 2021-10-12 MED ORDER — KETOROLAC TROMETHAMINE 15 MG/ML IJ SOLN
7.5000 mg | Freq: Once | INTRAMUSCULAR | Status: AC
  Administered 2021-10-12: 7.5 mg via INTRAVENOUS
  Filled 2021-10-12: qty 1

## 2021-10-12 NOTE — Discharge Summary (Signed)
[facility]   PATIENT NAME: Aaron Cain    MR#:  [id number]  DATE OF BIRTH:  11-29-1940  DATE OF ADMISSION:  10/06/2021 ADMITTING PHYSICIAN: Andris Baumann, MD  DATE OF DISCHARGE: 10/12/2021  PRIMARY CARE PHYSICIAN: Angela Cox, MD    ADMISSION DIAGNOSIS:  Wound infection [T14.8XXA, L08.9] Anemia, unspecified type [D64.9] Leukocytosis, unspecified type [D72.829]  DISCHARGE DIAGNOSIS:  left hip infections status post debridement in the OR with wound VAC placement.  SECONDARY DIAGNOSIS:   Past Medical History:  Diagnosis Date  . Alzheimer disease (HCC)   . COPD (chronic obstructive pulmonary disease) (HCC)   . Drug abuse (HCC)   . GERD (gastroesophageal reflux disease)   . Osteoporosis   . Parkinson disease (HCC)   . Schizophrenia [facility])     HOSPITAL COURSE:  Aaron Cain is a 80 y.o. male with medical history significant for Dementia, COPD, schizophrenia, with multiple decubitus ulcers currently being treated at the wound care center s/p wound debridement on 11/10 who was brought to the ED with a complaint of foul odor and oozing from the wound on his left hip.  Patient is mostly wheelchair and bedbound though he is able to pivot from bed to chair.     X-ray hip no acute osseous abnormality Chest x-ray clear   Infected decubitus ulcer of buttock/left hip, stage 3 -4 SIRS--tachycardia and elevated wbc - IV Zosyn and vancomycin - IV fluids - Surgical consult for debridement--dr piscoya to take pt to OR today --12/1--POD#1 Stage 4 decubitus left hip and Stage 3 decubitus wound of left ischium Debridement and drainage of left hip and left ischial  decubitus wounds by Dr Aleen Campi -- ID consult place for IV antibiotics --12/2-- wound VAC placed. --WC proteus, staph --12/3--no new issues  --12/4--no issues. Will d/w family regarding how aggressive they would want to be given severe malnutrition and wound  healing --12/5-- I had a discussion with patient's [name] on the phone. Discussed overall patient's comorbidities and severe malnutrition and he being bedbound. Patient's niece does not want aggressive IV antibiotics. Will treat with oral antibiotic for three weeks per ID recommendation she is in agreement with that. She understands patient could have an underlying osteomyelitis which is a difficult infection to treat.. She also understands patient has severe poor malnutrition which will affect and wound healing and this can recur also. Will discharge patient to his group home with wound VAC today. Surgery is okay with plan. TOC informed for discharge planning.   Acute on chronic anemia --no active bleed reported - Hemoglobin 8.1-7.1--1 unit BT--7.7--8.0--8.8 - Suspect due to blood loss from chronic wounds   protein calorie malnutrition, severe - appreciate dietitian consult Nutrition Status: Nutrition Problem: Severe Malnutrition Etiology: chronic illness (parkinsons dementia, COPD) Signs/Symptoms: severe fat depletion, severe muscle depletion         COPD (chronic obstructive pulmonary disease)  - DuoNebs as needed     Alzheimer disease (HCC)   Schizophrenia (HCC) - Continue home meds   Palliative care to see pt   DVT prophylaxis: Lovenox  Code Status: full code  Family Communication:  none  Disposition Plan: Back to previous home environment Consults called: Surgery Level of care: Med-Surg Status is: Inpatient   patient will discharged to his facility. Treatment Team:  Henrene Dodge, MD Lynn Ito, MD  DRUG ALLERGIES:   Allergies  Allergen Reactions  . Penicillins Rash  . Sulfa Antibiotics Rash  DISCHARGE MEDICATIONS:   Allergies as of 10/12/2021       Reactions   Penicillins Rash   Sulfa Antibiotics Rash        Medication List     TAKE these medications    acetaminophen 500 MG tablet Commonly known as: TYLENOL Take 1,000  mg by mouth 3 (three) times daily as needed.   alendronate 70 MG tablet Commonly known as: FOSAMAX Take 70 mg by mouth once a week. Take with a full glass of water on an empty stomach.   Calcium Carb-Cholecalciferol 500-10 MG-MCG Tabs Take 1 tablet by mouth 2 (two) times daily.   cefadroxil 500 MG capsule Commonly known as: DURICEF Take 1 capsule (500 mg total) by mouth 2 (two) times daily for 14 days.   cetirizine 10 MG tablet Commonly known as: ZYRTEC Take 10 mg by mouth daily.   divalproex 125 MG DR tablet Commonly known as: DEPAKOTE Take 125 mg by mouth 2 (two) times daily.   doxycycline 100 MG tablet Commonly known as: VIBRA-TABS Take 1 tablet (100 mg total) by mouth every 12 (twelve) hours for 14 days.   DSS 100 MG Caps Take 100 mg by mouth daily.   furosemide 20 MG tablet Commonly known as: LASIX Take 20 mg by mouth daily.   GenTeal Tears Night-Time Oint Apply 1 application to eye at bedtime as needed (dry eyes).   melatonin 3 MG Tabs tablet Take 3 mg by mouth at bedtime.   meloxicam 7.5 MG tablet Commonly known as: MOBIC Take 7.5 mg by mouth daily.   metroNIDAZOLE 500 MG tablet Commonly known as: FLAGYL Take 1 tablet (500 mg total) by mouth every 12 (twelve) hours for 14 days.   mirtazapine 15 MG tablet Commonly known as: REMERON Take 15 mg by mouth at bedtime.   multivitamin with minerals Tabs tablet Take 1 tablet by mouth daily. Start taking on: October 13, 2021   mupirocin ointment 2 % Commonly known as: BACTROBAN Place 1 application into the nose 2 (two) times daily.   OLANZapine 7.5 MG tablet Commonly known as: ZYPREXA Take 7.5 mg by mouth at bedtime.   omeprazole 20 MG capsule Commonly known as: PRILOSEC Take 20 mg by mouth daily.   polyethylene glycol powder 17 GM/SCOOP powder Commonly known as: GLYCOLAX/MIRALAX Take 17 g by mouth 2 (two) times daily. Mis with water and drink twice a day at 0700 and 2000   potassium chloride 10 MEQ  tablet Commonly known as: KLOR-CON M Take 10 mEq by mouth daily.               Discharge Care Instructions  (From admission, onward)           Start     Ordered   10/12/21 0000  Discharge wound care:       Comments: Negative pressure wound therapy  Every Mon-Wed-Fri 1000    Comments: WOC nurse will change left hip/ischium wounds Vac dressings Q M/W/F; use barrier ring around lower wound, tuck sponge into track from lower to middle wounds, bridge all 3 dressings together to one machine. Question Answer Comment Amount of suction? 125 mm/Hg   Suction Type? Continuous   10/12/21 1130            If you experience worsening of your admission symptoms, develop shortness of breath, life threatening emergency, suicidal or homicidal thoughts you must seek medical attention immediately by calling 911 or calling your MD immediately  if symptoms less severe.  You Must read complete instructions/literature along with all the possible adverse reactions/side effects for all the Medicines you take and that have been prescribed to you. Take any new Medicines after you have completely understood and accept all the possible adverse reactions/side effects.   Please note  You were cared for by a hospitalist during your hospital stay. If you have any questions about your discharge medications or the care you received while you were in the hospital after you are discharged, you can call the unit and asked to speak with the hospitalist on call if the hospitalist that took care of you is not available. Once you are discharged, your primary care physician will handle any further medical issues. Please note that NO REFILLS for any discharge medications will be authorized once you are discharged, as it is imperative that you return to your primary care physician (or establish a relationship with a primary care physician if you do not have one) for your aftercare needs so that they can reassess your need  for medications and monitor your lab values. Today   SUBJECTIVE   no new issues. Patient ate breakfast being fed by student nursing. New wound vac applied this morning  VITAL SIGNS:  Blood pressure (!) 97/52, pulse (!) 102, temperature 99.7 F (37.6 C), temperature source Oral, resp. rate 20, height  (1.778 m), weight 59.5 kg, SpO2 95 %.  I/O:   Intake/Output Summary (Last 24 hours) at 10/12/2021 1136 Last data filed at 10/12/2021 0608 Gross per 24 hour  Intake 555.58 ml  Output 875 ml  Net -319.42 ml    PHYSICAL EXAMINATION:  GENERAL:  80 y.o.-year-old patient lying in the bed with no acute distress. Thin frail cachectic LUNGS: creased breath sounds bilaterally, no wheezing, rales,rhonchi or crepitation. No use of accessory muscles of respiration.  CARDIOVASCULAR: S1, S2 normal. No murmurs, rubs, or gallops.  ABDOMEN: Soft, non-tender, non-distended. Bowel sounds present. No organomegaly or mass.  EXTREMITIES:  On 10/12/2021 PSYCHIATRIC:  patient is alert  SKIN: as above Pressure Injury 10/06/21 Coccyx Medial Stage 2 -  Partial thickness loss of dermis presenting as a shallow open injury with a red, pink wound bed without slough. (Active)  10/06/21 1800  Location: Coccyx  Location Orientation: Medial  Staging: Stage 2 -  Partial thickness loss of dermis presenting as a shallow open injury with a red, pink wound bed without slough.  Wound Description (Comments):   Present on Admission: Yes     Pressure Injury 10/06/21 Ischial tuberosity Left Unstageable - Full thickness tissue loss in which the base of the injury is covered by slough (yellow, tan, gray, green or brown) and/or eschar (tan, brown or black) in the wound bed. (Active)  10/06/21 1800  Location: Ischial tuberosity  Location Orientation: Left  Staging: Unstageable - Full thickness tissue loss in which the base of the injury is covered by slough (yellow, tan, gray, green or brown) and/or eschar (tan, brown or  black) in the wound bed.  Wound Description (Comments):   Present on Admission: Yes     Pressure Injury 10/06/21 Ischial tuberosity Right Stage 2 -  Partial thickness loss of dermis presenting as a shallow open injury with a red, pink wound bed without slough. (Active)  10/06/21 1800  Location: Ischial tuberosity  Location Orientation: Right  Staging: Stage 2 -  Partial thickness loss of dermis presenting as a shallow open injury with a red, pink wound bed without slough.  Wound Description (Comments):  Present on Admission: Yes     Pressure Injury 10/06/21 Hip Left Stage 4 - Full thickness tissue loss with exposed bone, tendon or muscle. (Active)  10/06/21 1800  Location: Hip  Location Orientation: Left  Staging: Stage 4 - Full thickness tissue loss with exposed bone, tendon or muscle.  Wound Description (Comments):   Present on Admission: Yes        DATA REVIEW:   CBC  Recent Labs  Lab 10/11/21 2045  WBC 14.6*  HGB 8.8*  HCT 28.1*  PLT 587*    Chemistries  Recent Labs  Lab 10/08/21 0521 10/09/21 0309 10/11/21 0417  NA 131*  --   --   K 4.1  --   --   CL 95*  --   --   CO2 28  --   --   GLUCOSE 114*  --   --   BUN 10  --   --   CREATININE 0.56*  0.55*   < > 0.37*  CALCIUM 7.8*  --   --   MG 2.0  --   --    < > = values in this interval not displayed.    Microbiology Results   Recent Results (from the past 240 hour(s))  Blood culture (routine x 2)     Status: None   Collection Time: 10/05/21 10:47 AM   Specimen: BLOOD  Result Value Ref Range Status   Specimen Description BLOOD LEFT ANTECUBITAL  Final   Special Requests   Final    BOTTLES DRAWN AEROBIC AND ANAEROBIC Blood Culture adequate volume   Culture   Final    NO GROWTH 5 DAYS Performed at [facility], [address] [place], [place] [address]    Report Status 10/10/2021 FINAL  Final  Blood culture (routine x 2)     Status: None   Collection Time: 10/05/21  1:53 PM   Specimen:  BLOOD  Result Value Ref Range Status   Specimen Description BLOOD BLOOD RIGHT HAND  Final   Special Requests   Final    BOTTLES DRAWN AEROBIC ONLY Blood Culture results may not be optimal due to an inadequate volume of blood received in culture bottles   Culture   Final    NO GROWTH 5 DAYS Performed at [facility], [address] [place], [place] [address]    Report Status 10/10/2021 FINAL  Final  Resp Panel by RT-PCR (Flu A&B, Covid) Nasopharyngeal Swab     Status: None   Collection Time: 10/06/21 12:33 AM   Specimen: Nasopharyngeal Swab; Nasopharyngeal(NP) swabs in vial transport medium  Result Value Ref Range Status   SARS Coronavirus 2 by RT PCR NEGATIVE NEGATIVE Final    Comment: (NOTE) SARS-CoV-2 target nucleic acids are NOT DETECTED.  The SARS-CoV-2 RNA is generally detectable in upper respiratory specimens during the acute phase of infection. The lowest concentration of SARS-CoV-2 viral copies this assay can detect is 138 copies/mL. A negative result does not preclude SARS-Cov-2 infection and should not be used as the sole basis for treatment or other patient management decisions. A negative result may occur with  improper specimen collection/handling, submission of specimen other than nasopharyngeal swab, presence of viral mutation(s) within the areas targeted by this assay, and inadequate number of viral copies(<138 copies/mL). A negative result must be combined with clinical observations, patient history, and epidemiological information. The expected result is Negative.  Fact Sheet for Patients:  BloggerCourse.com  Fact Sheet for Healthcare Providers:  SeriousBroker.it  This test is no t yet approved or cleared by the [place] and  has been authorized for detection and/or diagnosis of SARS-CoV-2 by FDA under an Emergency Use Authorization (EUA). This EUA will remain  in effect (meaning this test  can be used) for the duration of the COVID-19 declaration under Section 564(b)(1) of the Act, 21 U.S.C.section 360bbb-3(b)(1), unless the authorization is terminated  or revoked sooner.       Influenza A by PCR NEGATIVE NEGATIVE Final   Influenza B by PCR NEGATIVE NEGATIVE Final    Comment: (NOTE) The Xpert Xpress SARS-CoV-2/FLU/RSV plus assay is intended as an aid in the diagnosis of influenza from Nasopharyngeal swab specimens and should not be used as a sole basis for treatment. Nasal washings and aspirates are unacceptable for Xpert Xpress SARS-CoV-2/FLU/RSV testing.  Fact Sheet for Patients: BloggerCourse.com  Fact Sheet for Healthcare Providers: SeriousBroker.it  This test is not yet approved or cleared by the [place] FDA and has been authorized for detection and/or diagnosis of SARS-CoV-2 by FDA under an Emergency Use Authorization (EUA). This EUA will remain in effect (meaning this test can be used) for the duration of the COVID-19 declaration under Section 564(b)(1) of the Act, 21 U.S.C. section 360bbb-3(b)(1), unless the authorization is terminated or revoked.  Performed at [facility], [address], [place], [place] [address]   Aerobic/Anaerobic Culture w Gram Stain (surgical/deep wound)     Status: None   Collection Time: 10/06/21 12:51 AM   Specimen: Leg  Result Value Ref Range Status   Specimen Description   Final    LEG LEFT Performed at [facility], [address] [place], [place] [address]    Special Requests   Final    NONE Performed at [facility], [address] [place], [place] [address]    Gram Stain   Final    MODERATE WBC PRESENT, PREDOMINANTLY PMN MODERATE GRAM POSITIVE COCCI IN PAIRS MODERATE GRAM NEGATIVE COCCOBACILLI    Culture   Final    FEW PROTEUS MIRABILIS ABUNDANT STREPTOCOCCUS GALLOLYTICUS RARE ESCHERICHIA COLI RARE METHICILLIN RESISTANT  STAPHYLOCOCCUS AUREUS MODERATE BACTEROIDES CACCAE BETA LACTAMASE POSITIVE Performed at [facility], [address] [place], [place] [address]    Report Status 10/10/2021 FINAL  Final   Organism ID, Bacteria PROTEUS MIRABILIS  Final   Organism ID, Bacteria ESCHERICHIA COLI  Final   Organism ID, Bacteria STREPTOCOCCUS GALLOLYTICUS  Final   Organism ID, Bacteria METHICILLIN RESISTANT STAPHYLOCOCCUS AUREUS  Final      Susceptibility   Escherichia coli - MIC*    AMPICILLIN >=32 RESISTANT Resistant     CEFAZOLIN <=4 SENSITIVE Sensitive     CEFEPIME <=0.12 SENSITIVE Sensitive     CEFTAZIDIME <=1 SENSITIVE Sensitive     CEFTRIAXONE <=0.25 SENSITIVE Sensitive     CIPROFLOXACIN 0.5 INTERMEDIATE Intermediate     GENTAMICIN <=1 SENSITIVE Sensitive     IMIPENEM <=0.25 SENSITIVE Sensitive     TRIMETH/SULFA <=20 SENSITIVE Sensitive     AMPICILLIN/SULBACTAM 16 INTERMEDIATE Intermediate     PIP/TAZO <=4 SENSITIVE Sensitive     * RARE ESCHERICHIA COLI   Methicillin resistant staphylococcus aureus - MIC*    CIPROFLOXACIN >=8 RESISTANT Resistant     ERYTHROMYCIN >=8 RESISTANT Resistant     GENTAMICIN <=0.5 SENSITIVE Sensitive     OXACILLIN >=4 RESISTANT Resistant     TETRACYCLINE <=1 SENSITIVE Sensitive     VANCOMYCIN 1 SENSITIVE Sensitive     TRIMETH/SULFA <=10  SENSITIVE Sensitive     CLINDAMYCIN <=0.25 SENSITIVE Sensitive     RIFAMPIN <=0.5 SENSITIVE Sensitive     Inducible Clindamycin NEGATIVE Sensitive     * RARE METHICILLIN RESISTANT STAPHYLOCOCCUS AUREUS   Proteus mirabilis - MIC*    AMPICILLIN <=2 SENSITIVE Sensitive     CEFAZOLIN <=4 SENSITIVE Sensitive     CEFEPIME <=0.12 SENSITIVE Sensitive     CEFTAZIDIME <=1 SENSITIVE Sensitive     CEFTRIAXONE <=0.25 SENSITIVE Sensitive     CIPROFLOXACIN <=0.25 SENSITIVE Sensitive     GENTAMICIN <=1 SENSITIVE Sensitive     IMIPENEM 2 SENSITIVE Sensitive     TRIMETH/SULFA <=20 SENSITIVE Sensitive     AMPICILLIN/SULBACTAM <=2 SENSITIVE  Sensitive     PIP/TAZO <=4 SENSITIVE Sensitive     * FEW PROTEUS MIRABILIS   Streptococcus gallolyticus - MIC*    PENICILLIN <=0.06 SENSITIVE Sensitive     CEFTRIAXONE <=0.12 SENSITIVE Sensitive     ERYTHROMYCIN <=0.12 SENSITIVE Sensitive     LEVOFLOXACIN 1 SENSITIVE Sensitive     VANCOMYCIN 0.5 SENSITIVE Sensitive     * ABUNDANT STREPTOCOCCUS GALLOLYTICUS  Surgical pcr screen     Status: Abnormal   Collection Time: 10/07/21  4:16 PM   Specimen: Nasal Mucosa; Nasal Swab  Result Value Ref Range Status   MRSA, PCR POSITIVE (A) NEGATIVE Final    Comment: RESULT CALLED TO, READ BACK BY AND VERIFIED WITH: KRISTY DAVENPORT  10/07/21 LFD    Staphylococcus aureus POSITIVE (A) NEGATIVE Final    Comment: RESULT CALLED TO, READ BACK BY AND VERIFIED WITH: KRISTY DAVENPORT  10/07/21 LFD (NOTE) The Xpert SA Assay (FDA approved for NASAL specimens in patients 22 years of age and older), is one component of a comprehensive surveillance program. It is not intended to diagnose infection nor to guide or monitor treatment. Performed at [facility], [address] [place], [place] [address]   Aerobic/Anaerobic Culture w Gram Stain (surgical/deep wound)     Status: None (Preliminary result)   Collection Time: 10/07/21  9:20 PM   Specimen: PATH Bone biopsy; Tissue  Result Value Ref Range Status   Specimen Description   Final    BONE BX Performed at [facility], [address] [place], [place] [address]    Special Requests   Final    NONE Performed at [facility], [address] [place], [place] [address]    Gram Stain   Final    NO SQUAMOUS EPITHELIAL CELLS SEEN FEW WBC SEEN FEW GRAM POSITIVE COCCI    Culture   Final    FEW ENTEROCOCCUS RAFFINOSUS CULTURE REINCUBATED FOR BETTER GROWTH RARE BACTEROIDES THETAIOTAOMICRON BETA LACTAMASE POSITIVE Performed at [facility], [address], [place], [place] [address]    Report Status PENDING   Incomplete   Organism ID, Bacteria ENTEROCOCCUS RAFFINOSUS  Final      Susceptibility   Enterococcus raffinosus - MIC*    AMPICILLIN 16 RESISTANT Resistant     VANCOMYCIN <=0.5 SENSITIVE Sensitive     GENTAMICIN SYNERGY RESISTANT Resistant     * FEW ENTEROCOCCUS RAFFINOSUS  Aerobic/Anaerobic Culture w Gram Stain (surgical/deep wound)     Status: None   Collection Time: 10/07/21  9:23 PM   Specimen: PATH Other; Wound  Result Value Ref Range Status   Specimen Description   Final    TISSUE Performed at [facility], [address], [place], [place] [address]    Special Requests   Final  NONE Performed at [facility], [address] [place], [place] [address]    Gram Stain   Final    NO SQUAMOUS EPITHELIAL CELLS SEEN FEW WBC SEEN FEW GRAM POSITIVE RODS FEW GRAM NEGATIVE RODS FEW GRAM POSITIVE COCCI    Culture   Final    FEW PROTEUS MIRABILIS ABUNDANT ENTEROCOCCUS RAFFINOSUS SUSCEPTIBILITIES PERFORMED ON PREVIOUS CULTURE WITHIN THE LAST 5 DAYS. ABUNDANT CORYNEBACTERIUM STRIATUM Standardized susceptibility testing for this organism is not available. FEW BACTEROIDES THETAIOTAOMICRON BETA LACTAMASE POSITIVE Performed at [facility], [address], [place], [place] [address]    Report Status 10/11/2021 FINAL  Final  Aerobic/Anaerobic Culture w Gram Stain (surgical/deep wound)     Status: None   Collection Time: 10/07/21  9:48 PM   Specimen: Joint, Hip  Result Value Ref Range Status   Specimen Description HIP  Final   Special Requests NONE  Final   Gram Stain   Final    NO SQUAMOUS EPITHELIAL CELLS SEEN RARE WBC SEEN FEW GRAM NEGATIVE RODS MODERATE GRAM POSITIVE COCCI    Culture   Final    FEW PROTEUS MIRABILIS MODERATE ENTEROCOCCUS RAFFINOSUS SUSCEPTIBILITIES PERFORMED ON PREVIOUS CULTURE WITHIN THE LAST 5 DAYS. ABUNDANT CORYNEBACTERIUM STRIATUM Standardized susceptibility testing for this organism is not available. FEW BACTEROIDES  CACCAE FEW BACTEROIDES THETAIOTAOMICRON BETA LACTAMASE POSITIVE FOR BOTH BACTEROIDES SPECIES Performed at [facility], [address], [place], [place] [address]    Report Status 10/11/2021 FINAL  Final    RADIOLOGY:  No results found.   CODE STATUS:     Code Status Orders  (From admission, onward)           Start     Ordered   10/11/21 1402  Do not attempt resuscitation (DNR)  Continuous       Question Answer Comment  In the event of cardiac or respiratory ARREST Do not call a "code blue"   In the event of cardiac or respiratory ARREST Do not perform Intubation, CPR, defibrillation or ACLS   In the event of cardiac or respiratory ARREST Use medication by any route, position, wound care, and other measures to relive pain and suffering. May use oxygen, suction and manual treatment of airway obstruction as needed for comfort.   Comments d/w niece Janyth Contes callway      10/11/21 1402           Code Status History     Date Active Date Inactive Code Status Order ID Comments User Context   10/06/2021 0309 10/11/2021 1402 Full Code [id number]  Andris Baumann, MD ED        TOTAL TIME TAKING CARE OF THIS PATIENT: 40 minutes.    Enedina Finner M.D  [facility]    CC: Primary care physician; Angela Cox, MD

## 2021-10-12 NOTE — Progress Notes (Signed)
Took pt VS and his HR was 139. On call provider notified. See new orders in epic.   10/11/21 1958  Assess: MEWS Score  Temp 99.2 F (37.3 C)  BP 128/73  Pulse Rate (!) 139  Resp 20  SpO2 94 %  O2 Device Nasal Cannula  O2 Flow Rate (L/min) 2 L/min  Assess: MEWS Score  MEWS Temp 0  MEWS Systolic 0  MEWS Pulse 3  MEWS RR 0  MEWS LOC 0  MEWS Score 3  MEWS Score Color Yellow  Assess: if the MEWS score is Yellow or Red  Were vital signs taken at a resting state? Yes  Focused Assessment Change from prior assessment (see assessment flowsheet)  Does the patient meet 2 or more of the SIRS criteria? Yes  Does the patient have a confirmed or suspected source of infection? Yes  Provider and Rapid Response Notified? Yes  Treat  MEWS Interventions Administered prn meds/treatments;Other (Comment) (See epic for orders)  Pain Scale 0-10  Pain Score 0  Escalate  MEWS: Escalate Yellow: discuss with charge nurse/RN and consider discussing with provider and RRT  Notify: Charge Nurse/RN  Name of Charge Nurse/RN Notified Tamara, RN  Date Charge Nurse/RN Notified 10/11/21  Time Charge Nurse/RN Notified 2015  Notify: Provider  Provider Name/Title [name], MD  Date Provider Notified 10/11/21  Time Provider Notified 2004  Notification Type Page  Notification Reason Other (Comment) (HR 139)  Provider response See new orders  Date of Provider Response 10/11/21  Time of Provider Response 2003  Notify: Rapid Response  Name of Rapid Response RN Notified Not applicable  Assess: SIRS CRITERIA  SIRS Temperature  0  SIRS Pulse 1  SIRS Respirations  0  SIRS WBC 0  SIRS Score Sum  1

## 2021-10-12 NOTE — Progress Notes (Signed)
SATURATION QUALIFICATIONS:   Patient Saturations on Room Air at Rest = 87%  Patient Saturations on Room Air while Ambulating = Bedbound  Patient Saturations on 2 Liters of oxygen while in bed= 95%

## 2021-10-12 NOTE — TOC Transition Note (Signed)
Transition of Care [facility]) - CM/SW Discharge Note   Patient Details  Name: Aaron Cain MRN: [id number] Date of Birth: September 20, 1941  Transition of Care [facility]) CM/SW Contact:  Margarito Liner, LCSW Phone Number: 10/12/2021, 4:31 PM   Clinical Narrative:  Patient has orders to discharge back to [facility] today. RN will call EMS transport once oxygen has been delivered to the facility. Faxed signed FL2 to [facility]. Transport paperwork and signed DNR are in the discharge packet. No further concerns. CSW signing off.   Final next level of care: Assisted Living (with home health) Barriers to Discharge: Other (must enter comment) (Oxygen delivery to ALF)   Patient Goals and CMS Choice     Choice offered to / list presented to : NA  Discharge Placement                Patient to be transferred to facility by: EMS Name of family member notified: Asencion Islam Patient and family notified of of transfer: 10/12/21  Discharge Plan and Services     Post Acute Care Choice: Resumption of Svcs/PTA Provider          DME Arranged: Oxygen DME Agency: [employer] Date DME Agency Contacted: 10/12/21   Representative spoke with at DME Agency: Bjorn Loser HH Arranged: RN [facility] Agency: [facility] Date [facility] Agency Contacted: 10/12/21   Representative spoke with at [facility] Agency: [place] [facility]  Social Determinants of Health (SDOH) Interventions     Readmission Risk Interventions No flowsheet data found.

## 2021-10-12 NOTE — Progress Notes (Signed)
Wound Vac Change Note With the assistance of WOC RN at bedside, I removed previous wound vac dressing to the left lateral hip wounds without issue. There was a small amount of punctate bleeding from the inferior wound. His more superior hip wound overlaying the trochanter measured 7 x 13 x 1 cm with a 6 cm tunnel inferiorly. There is exposed bone medially in this wound. The more inferior left hip wound measured 7 x 4 1.5 cm. Bone wound beds had healthy granulation tissue forming, there was no evidence of any remaining necrotic tissue. No surrounding skin erythema. I have attached pictures to this note.   We did place a piece of adaptic dressing overtop of exposed bone to the medial portion of his more superior left hip wound. All wounds were then packed with grey foam, including tunneled portion of his wounds, without issues. These were then bridged together. A seal to -125 mmHG was obtained without issues.   Left Hip - Superior (10/12/2021):      Left Hip - Inferior (10/12/2021):    Please note:  We did review [facility] wound vac therapy manual regarding negative pressure therapy and osteomyelitis, and I have attached recommendations below:      Wounds underwent debridement on 11/30 with Dr Aleen Campi and there is no residual evidence of necrosis on wound evaluation today. Medical team has discussed with ID and patient's niece regarding Abx therapy for osteomyelitis, which she has refused. I feel it remains reasonable to continue wound vac for home with placement of non-adherent dressing overtop of exposed bone as recommended above.   We will continue to follow in house for dressing changes MWF as needed  -- Lynden Oxford, PA-C [facility] 10/12/2021, 9:47 AM [phone] M-F: 7am - 4pm

## 2021-10-12 NOTE — Care Management Important Message (Signed)
Important Message  Patient Details  Name: Aaron Cain MRN: [id number] Date of Birth: 1941-02-16   Medicare Important Message Given:  Yes  Copy of Medicare IM left in room, no family upon time of visit.   Johnell Comings 10/12/2021, 11:10 AM

## 2021-10-12 NOTE — Progress Notes (Signed)
[facility] SURGICAL PROGRESS NOTE  Hospital Day(s): 6.   Post op day(s): 5 Days Post-Op.   Interval History:  Patient seen and examined No acute events or new complaints overnight.  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 as risk of Abx outweigh benefit Pending palliative care consultation to establish GOC Pending authorization of home wound vac    Vital signs in last 24 hours: [min-max] current  Temp:  [97.8 F (36.6 C)-102.5 F (39.2 C)] 98 F (36.7 C) (12/05 0534) Pulse Rate:  [93-139] 93 (12/05 0534) Resp:  [16-24] 20 (12/05 0534) BP: (100-128)/(62-74) 102/63 (12/05 0534) SpO2:  [94 %-98 %] 94 % (12/05 0534)     Height: 5\' 10"  (177.8 cm) Weight: 59.5 kg BMI (Calculated): 18.82   Intake/Output last 2 shifts:  12/04 0701 - 12/05 0700 In: 1015.6 [P.O.:940; IV Piggyback:75.6] Out: 875 [Urine:800; Drains:75]   Physical Exam:  Constitutional: alert, confused Respiratory: breathing non-labored at rest  Cardiovascular: regular rate and sinus rhythm  Integumentary: Wound vac to left lateral hip wounds with bridge, good seal  Labs:  CBC Latest Ref Rng & Units 10/11/2021 10/09/2021 10/08/2021  WBC 4.0 - 10.5 K/uL 14.6(H) 19.1(H) 15.5(H)  Hemoglobin 13.0 - 17.0 g/dL 14/11/2020) 6.2(H) 4.7(M)  Hematocrit 39.0 - 52.0 % 28.1(L) 27.3(L) 24.7(L)  Platelets 150 - 400 K/uL 587(H) 556(H) 458(H)   CMP Latest Ref Rng & Units 10/11/2021 10/10/2021 10/09/2021  Glucose 70 - 99 mg/dL - - -  BUN 8 - 23 mg/dL - - -  Creatinine 14/12/2020 - 1.24 mg/dL 5.03) 5.46(F) 6.81(E)  Sodium 135 - 145 mmol/L - - -  Potassium 3.5 - 5.1 mmol/L - - -  Chloride 98 - 111 mmol/L - - -  CO2 22 - 32 mmol/L - - -  Calcium 8.9 - 10.3 mg/dL - - -  Total Protein 6.5 - 8.1 g/dL - - -   Total Bilirubin 0.3 - 1.2 mg/dL - - -  Alkaline Phos 38 - 126 U/L - - -  AST 15 - 41 U/L - - -  ALT 0 - 44 U/L - - -     Imaging studies: No new pertinent imaging studies   Assessment/Plan:  80 y.o. male 5 Days Post-Op s/p debridement of infected unstageable left hip wound, complicated by pertinent comorbidities including dementia.   - Appreciate WOC RN help; continue wound vac with changes MWF; I will be available today to help with change and reassess wound  - Continue Abx (doxycycline, cefadroxil and flagyl); infectious disease on-board  - Continue frequent repositioning, pressure off-loading             - Further management per primary service   - Discharge Planning; Stable from surgical perspective. Certainly agree with palliative care discussion to establish goals. Seems to be pending authorization for home health vac.    All of the above findings and recommendations were discussed with the medical team.   -- 72, PA-C [facility] 10/12/2021, 7:34 AM [phone] M-F: 7am - 4pm

## 2021-10-12 NOTE — Consult Note (Signed)
WOC Nurse wound follow up Patient receiving care in [facility]. 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 We discussed at length in person, and then he discussed via telephone, the [facility] manufacturer safety guidelines of NOT placing VAC therapy over bone with osteomyelitis.  He relayed this to his covering physician. Together, they decided to continue the therapy and protect the exposed bone with a non-adherent dressing. In the near future, discussions are to be held about goals of care, etc.  I do not think this wound will benefit from [facility] therapy. I understand the bone with osteomyelitis remains in place, not surgically removed.  I explained I cannot go against the manufacturer safety guidelines, but I will be happy to assist him with care the surgical team decides to provide, if my help is needed.  To that end, Mr. Aaron Cain provided the hands on application of the [facility] therapy today with my assistance. Helmut Muster, RN, MSN, CWOCN, CNS-BC, pager [phone]

## 2021-10-12 NOTE — NC FL2 (Signed)
[place] MEDICAID FL2 LEVEL OF CARE SCREENING TOOL     IDENTIFICATION  Patient Name: Aaron Cain Birthdate: 03/15/1941 Sex: male Admission Date (Current Location): 10/06/2021  [facility] and [place] Number:  Chiropodist and Address:  [facility], [address], [place], [place] [address]      Provider Number: [id number]  Attending Physician Name and Address:  Enedina Finner, MD  Relative Name and Phone Number:       Current Level of Care: Hospital Recommended Level of Care: Assisted Living Facility (with RN through [facility]) Prior Approval Number:    Date Approved/Denied:   PASRR Number:    Discharge Plan: Other (Comment) (ALF with home health RN through [facility])    Current Diagnoses: Patient Active Problem List   Diagnosis Date Noted   Protein-calorie malnutrition, severe 10/07/2021   Pressure injury of left hip, stage 4 (HCC)    COPD (chronic obstructive pulmonary disease) (HCC)    Alzheimer disease (HCC)    Schizophrenia (HCC)    Wound infection    Decubitus ulcer of buttock, stage 3 (HCC)    Acute blood loss anemia     Orientation RESPIRATION BLADDER Height & Weight     Self  O2 (Nasal Cannula 2 L) Continent Weight: 131 lb 2.8 oz (59.5 kg) Height:  5\' 10"  (177.8 cm)  BEHAVIORAL SYMPTOMS/MOOD NEUROLOGICAL BOWEL NUTRITION STATUS   None   Continent Diet (DYS 3)  AMBULATORY STATUS COMMUNICATION OF NEEDS Skin     Verbally Wound Vac, PU Stage and Appropriate Care (Unstageable pressure injury on left ischial tuberosity (wound vac. dressing changes MWF). Stage 4 pressure injury on left hip (wound vac. dressing changes MWF).)   PU Stage 2 Dressing:  (Medial coccyx: foam prn. Right ischial tuberosity: Foam prn.)                   Personal Care Assistance Level of Assistance              Functional Limitations Info  Sight, Hearing, Speech Sight Info: Adequate Hearing Info: Adequate Speech Info: Impaired (Delayed  responses)    SPECIAL CARE FACTORS FREQUENCY                       Contractures Contractures Info: Not present    Additional Factors Info  Code Status, Allergies, Psychotropic Code Status Info: DNR Allergies Info: Penicillins, Sulfa Antibiotics Psychotropic Info: Schizophrenia         Current Medications (10/12/2021):  This is the current hospital active medication list Current Facility-Administered Medications  Medication Dose Route Frequency Provider Last Rate Last Admin   acetaminophen (TYLENOL) tablet 650 mg  650 mg Oral Q6H PRN Piscoya, Jose, MD   650 mg at 10/12/21 14/05/22   Or   acetaminophen (TYLENOL) suppository 650 mg  650 mg Rectal Q6H PRN Piscoya, Jose, MD       acetaminophen (TYLENOL) tablet 325 mg  325 mg Oral Once 2725, MD       ascorbic acid (VITAMIN C) tablet 500 mg  500 mg Oral BID Eduard Clos, MD   500 mg at 10/12/21 14/05/22   calcium-vitamin D (OSCAL WITH D) 500-5 MG-MCG per tablet 1 tablet  1 tablet Oral BID 3664, MD   1 tablet at 10/12/21 14/05/22   cefadroxil (DURICEF) capsule 500 mg  500 mg Oral BID 4034, MD   500 mg at 10/12/21 1002   divalproex (DEPAKOTE) DR tablet 125  mg  125 mg Oral BID Enedina Finner, MD   125 mg at 10/12/21 0932   docusate sodium (COLACE) capsule 100 mg  100 mg Oral Daily Enedina Finner, MD   100 mg at 10/11/21 0909   doxycycline (VIBRA-TABS) tablet 100 mg  100 mg Oral Q12H Ravishankar, Rhodia Albright, MD   100 mg at 10/12/21 0928   enoxaparin (LOVENOX) injection 40 mg  40 mg Subcutaneous Q24H Piscoya, Jose, MD   40 mg at 10/12/21 0934   feeding supplement (ENSURE ENLIVE / ENSURE PLUS) liquid 237 mL  237 mL Oral TID BM Piscoya, Jose, MD   237 mL at 10/12/21 1449   HYDROcodone-acetaminophen (NORCO/VICODIN) 5-325 MG per tablet 1-2 tablet  1-2 tablet Oral Q4H PRN Henrene Dodge, MD   2 tablet at 10/09/21 1454   loratadine (CLARITIN) tablet 10 mg  10 mg Oral Daily Enedina Finner, MD   10 mg at 10/12/21 2952    melatonin tablet 5 mg  5 mg Oral QHS Enedina Finner, MD   5 mg at 10/11/21 2135   metroNIDAZOLE (FLAGYL) tablet 500 mg  500 mg Oral Q12H Lynn Ito, MD   500 mg at 10/12/21 0932   mirtazapine (REMERON) tablet 15 mg  15 mg Oral QHS Enedina Finner, MD   15 mg at 10/11/21 2135   morphine 2 MG/ML injection 2 mg  2 mg Intravenous Q4H PRN Enedina Finner, MD       multivitamin with minerals tablet 1 tablet  1 tablet Oral Daily Piscoya, Jose, MD   1 tablet at 10/12/21 8413   mupirocin ointment (BACTROBAN) 2 % 1 application  1 application Nasal BID Piscoya, Elita Quick, MD   1 application at 10/12/21 0935   OLANZapine (ZYPREXA) tablet 7.5 mg  7.5 mg Oral QHS Enedina Finner, MD   7.5 mg at 10/11/21 2136   ondansetron (ZOFRAN) tablet 4 mg  4 mg Oral Q6H PRN Piscoya, Jose, MD       Or   ondansetron (ZOFRAN) injection 4 mg  4 mg Intravenous Q6H PRN Piscoya, Jose, MD       pantoprazole (PROTONIX) EC tablet 40 mg  40 mg Oral Daily Enedina Finner, MD   40 mg at 10/12/21 2440   polyethylene glycol (MIRALAX / GLYCOLAX) packet 17 g  17 g Oral BID Enedina Finner, MD   17 g at 10/12/21 1027     Discharge Medications: TAKE these medications     acetaminophen 500 MG tablet Commonly known as: TYLENOL Take 1,000 mg by mouth 3 (three) times daily as needed.    alendronate 70 MG tablet Commonly known as: FOSAMAX Take 70 mg by mouth once a week. Take with a full glass of water on an empty stomach.    Calcium Carb-Cholecalciferol 500-10 MG-MCG Tabs Take 1 tablet by mouth 2 (two) times daily.    cefadroxil 500 MG capsule Commonly known as: DURICEF Take 1 capsule (500 mg total) by mouth 2 (two) times daily for 14 days.    cetirizine 10 MG tablet Commonly known as: ZYRTEC Take 10 mg by mouth daily.    divalproex 125 MG DR tablet Commonly known as: DEPAKOTE Take 125 mg by mouth 2 (two) times daily.    doxycycline 100 MG tablet Commonly known as: VIBRA-TABS Take 1 tablet (100 mg total) by mouth every 12 (twelve) hours  for 14 days.    DSS 100 MG Caps Take 100 mg by mouth daily.    furosemide 20 MG tablet Commonly known as: LASIX  Take 20 mg by mouth daily.    GenTeal Tears Night-Time Oint Apply 1 application to eye at bedtime as needed (dry eyes).    melatonin 3 MG Tabs tablet Take 3 mg by mouth at bedtime.    meloxicam 7.5 MG tablet Commonly known as: MOBIC Take 7.5 mg by mouth daily.    metroNIDAZOLE 500 MG tablet Commonly known as: FLAGYL Take 1 tablet (500 mg total) by mouth every 12 (twelve) hours for 14 days.    mirtazapine 15 MG tablet Commonly known as: REMERON Take 15 mg by mouth at bedtime.    multivitamin with minerals Tabs tablet Take 1 tablet by mouth daily. Start taking on: October 13, 2021    mupirocin ointment 2 % Commonly known as: BACTROBAN Place 1 application into the nose 2 (two) times daily.    OLANZapine 7.5 MG tablet Commonly known as: ZYPREXA Take 7.5 mg by mouth at bedtime.    omeprazole 20 MG capsule Commonly known as: PRILOSEC Take 20 mg by mouth daily.    polyethylene glycol powder 17 GM/SCOOP powder Commonly known as: GLYCOLAX/MIRALAX Take 17 g by mouth 2 (two) times daily. Mis with water and drink twice a day at 0700 and 2000    potassium chloride 10 MEQ tablet Commonly known as: KLOR-CON M Take 10 mEq by mouth daily.    Relevant Imaging Results:  Relevant Lab Results:   Additional Information SS#: [id number]  Margarito Liner, LCSW

## 2021-10-12 NOTE — TOC Progression Note (Addendum)
Transition of Care [facility]) - Progression Note    Patient Details  Name: Aaron Cain MRN: [id number] Date of Birth: 01/01/1941  Transition of Care [facility]) CM/SW Contact  Margarito Liner, LCSW Phone Number: 10/12/2021, 2:48 PM  Clinical Narrative:   Patient currently on 2 L acute oxygen.   3:28 pm: Ordered oxygen through [employer] to be delivered to [facility] prior to transport being arranged. FL2 and discharge summary faxed to [facility]. Asked her to call unit (if after 5:00) once oxygen is delivered so that transport can be arranged.  Expected Discharge Plan: Assisted Living (with home health) Barriers to Discharge: Continued Medical Work up  Expected Discharge Plan and Services Expected Discharge Plan: Assisted Living (with home health)     Post Acute Care Choice: Resumption of Svcs/PTA Provider Living arrangements for the past 2 months: Assisted Living Facility Expected Discharge Date: 10/12/21                         HH Arranged: RN HH Agency: [facility] Date [facility] Agency Contacted: 10/07/21   Representative spoke with at [facility] Agency: [place] [facility]   Social Determinants of Health (SDOH) Interventions    Readmission Risk Interventions No flowsheet data found.

## 2021-10-12 NOTE — Discharge Instructions (Addendum)
Wound vac care per instructions Pt to f/u surgery in 1-2 weeks (dr Aleen Campi)  Take Doxycyline (antibiotic) 2 hours before or 4 hours after taking calcium and multivitamin   Wound Vac Changes: Please ensure placing non-adherent dressing (Adaptic) over exposed bone with wound vac changes. These should occur MWF

## 2021-10-12 NOTE — Progress Notes (Signed)
Took pt VS at 2339, Pt Temp was 102.5 F and his HR was 138. On call provider notified, see epic for orders. Charge nurse notified and aware.   10/11/21 2339  Assess: MEWS Score  Temp (!) 102.5 F (39.2 C)  BP 120/69  Pulse Rate (!) 138  Resp 20  Level of Consciousness Alert  SpO2 94 %  O2 Device Nasal Cannula  O2 Flow Rate (L/min) 2 L/min  Assess: MEWS Score  MEWS Temp 2  MEWS Systolic 0  MEWS Pulse 3  MEWS RR 0  MEWS LOC 0  MEWS Score 5  MEWS Score Color Red  Assess: if the MEWS score is Yellow or Red  Focused Assessment Change from prior assessment (see assessment flowsheet)  Does the patient meet 2 or more of the SIRS criteria? Yes  Does the patient have a confirmed or suspected source of infection? Yes  Provider and Rapid Response Notified? Yes  MEWS guidelines implemented *See Row Information* Yes  Treat  MEWS Interventions Administered scheduled meds/treatments  Pain Scale 0-10  Pain Score 0  Faces Pain Scale 0  Escalate  MEWS: Escalate Red: discuss with charge nurse/RN and provider, consider discussing with RRT  Notify: Charge Nurse/RN  Name of Charge Nurse/RN Notified Tamara, RN  Date Charge Nurse/RN Notified 10/11/21  Time Charge Nurse/RN Notified 2345  Notify: Provider  Provider Name/Title Teddy Spike, MD  Date Provider Notified 10/11/21  Time Provider Notified 2345  Notification Type Page  Notification Reason Change in status  Provider response See new orders  Date of Provider Response 10/11/21  Time of Provider Response 2342  Notify: Rapid Response  Name of Rapid Response RN Notified Not applicable  Assess: SIRS CRITERIA  SIRS Temperature  1  SIRS Pulse 1  SIRS Respirations  0  SIRS WBC 1  SIRS Score Sum  3

## 2021-10-13 LAB — AEROBIC/ANAEROBIC CULTURE W GRAM STAIN (SURGICAL/DEEP WOUND): Gram Stain: NONE SEEN

## 2021-10-13 NOTE — Progress Notes (Signed)
Aaron Cain  A and O x 4 VSS. Pt tolerating diet well. No complaints of pain or nausea. IV removed intact, prescriptions given. Pt voices understanding of discharge instructions with no further questions. Pt discharged 10/12/21 via stretcher with EMS, going to [facility].  Allergies as of 10/13/2021       Reactions   Penicillins Rash   Sulfa Antibiotics Rash        Medication List     TAKE these medications    acetaminophen 500 MG tablet Commonly known as: TYLENOL Take 1,000 mg by mouth 3 (three) times daily as needed.   alendronate 70 MG tablet Commonly known as: FOSAMAX Take 70 mg by mouth once a week. Take with a full glass of water on an empty stomach.   Calcium Carb-Cholecalciferol 500-10 MG-MCG Tabs Take 1 tablet by mouth 2 (two) times daily.   cefadroxil 500 MG capsule Commonly known as: DURICEF Take 1 capsule (500 mg total) by mouth 2 (two) times daily for 14 days.   cetirizine 10 MG tablet Commonly known as: ZYRTEC Take 10 mg by mouth daily.   divalproex 125 MG DR tablet Commonly known as: DEPAKOTE Take 125 mg by mouth 2 (two) times daily.   doxycycline 100 MG tablet Commonly known as: VIBRA-TABS Take 1 tablet (100 mg total) by mouth every 12 (twelve) hours for 14 days.   DSS 100 MG Caps Take 100 mg by mouth daily.   furosemide 20 MG tablet Commonly known as: LASIX Take 20 mg by mouth daily.   GenTeal Tears Night-Time Oint Apply 1 application to eye at bedtime as needed (dry eyes).   melatonin 3 MG Tabs tablet Take 3 mg by mouth at bedtime.   meloxicam 7.5 MG tablet Commonly known as: MOBIC Take 7.5 mg by mouth daily.   metroNIDAZOLE 500 MG tablet Commonly known as: FLAGYL Take 1 tablet (500 mg total) by mouth every 12 (twelve) hours for 14 days.   mirtazapine 15 MG tablet Commonly known as: REMERON Take 15 mg by mouth at bedtime.   multivitamin with minerals Tabs tablet Take 1 tablet by mouth daily.   mupirocin ointment 2  % Commonly known as: BACTROBAN Place 1 application into the nose 2 (two) times daily.   OLANZapine 7.5 MG tablet Commonly known as: ZYPREXA Take 7.5 mg by mouth at bedtime.   omeprazole 20 MG capsule Commonly known as: PRILOSEC Take 20 mg by mouth daily.   polyethylene glycol powder 17 GM/SCOOP powder Commonly known as: GLYCOLAX/MIRALAX Take 17 g by mouth 2 (two) times daily. Mis with water and drink twice a day at 0700 and 2000   potassium chloride 10 MEQ tablet Commonly known as: KLOR-CON M Take 10 mEq by mouth daily.               Durable Medical Equipment  (From admission, onward)           Start     Ordered   10/12/21 1451  For home use only DME oxygen  Once       Question Answer Comment  Length of Need 6 Months   Mode or (Route) Nasal cannula   Liters per Minute 2   Frequency Continuous (stationary and portable oxygen unit needed)   Oxygen conserving device Yes   Oxygen delivery system Gas      10/12/21 1450              Discharge Care Instructions  (From admission, onward)  Start     Ordered   10/12/21 0000  Discharge wound care:       Comments: Negative pressure wound therapy  Every Mon-Wed-Fri 1000    Comments: WOC nurse will change left hip/ischium wounds Vac dressings Q M/W/F; use barrier ring around lower wound, tuck sponge into track from lower to middle wounds, bridge all 3 dressings together to one machine. Question Answer Comment Amount of suction? 125 mm/Hg   Suction Type? Continuous   10/12/21 1130   10/12/21 0000  Discharge wound care:       Comments: Wound Vac Changes: Please ensure placing non-adherent dressing (Adaptic) over exposed bone with wound vac changes. These should occur MWF   10/12/21 1412            Vitals:   10/12/21 1601 10/12/21 1933  BP: 110/86 (!) 100/58  Pulse: (!) 109 (!) 106  Resp: 16 (!) 24  Temp: 98.4 F (36.9 C) 98 F (36.7 C)  SpO2: 91% 90%    Whitney Post

## 2021-10-15 ENCOUNTER — Encounter: Primary: Registered Nurse

## 2021-10-16 ENCOUNTER — Encounter: Payer: Medicare HMO | Admitting: Surgery

## 2021-10-25 NOTE — Telephone Encounter (Signed)
Paged that pt's blood sugar at 4:30 was 379.  On lantus 38 AM and 10 PM. Then lispro three times daily with meals.    Med tech [name] states their directions are for 3 unites of insulin for every 30 points above150. That would be 21 units of insulin.  She was concerned that was too much.    I advised 15 units of lispro now.  He has been eating normallly today. Nori Riis, MD  10/25/2021

## 2021-11-04 MED ORDER — TAMSULOSIN HCL 0.4 MG PO CAPS
0.4 MG | ORAL_CAPSULE | ORAL | 0 refills | Status: DC
Start: 2021-11-04 — End: 2023-04-12

## 2021-11-04 NOTE — Telephone Encounter (Signed)
Alejandro [name]  October 16, 1941      Call back needed: No    Preferred call back number:   Home Phone [phone] (home)    Medications Requested:  Requested Prescriptions     Pending Prescriptions Disp Refills    tamsulosin (FLOMAX) 0.4 MG capsule 30 capsule        Preferred Pharmacy:   [facility] [place], [place] - [address] [place] [phone] [name] [phone]  [address]  [place] [address]  Phone: [phone] Fax: [phone]      Other Instructions:

## 2021-11-04 NOTE — Telephone Encounter (Signed)
Medications Requested:  Requested Prescriptions     Pending Prescriptions Disp Refills    tamsulosin (FLOMAX) 0.4 MG capsule 60 capsule 0     Sig: TAKE 1 CAPSULE BY MOUTH ONCE DAILY.       Preferred Pharmacy:   [facility] - [place], [place] - [address] - [place] [phone] [name] [phone]  [address]  [place] [address]  Phone: [phone] Fax: [phone]      Date of Last Refill:     Prescription Refill Protocol reviewed:      Allergy List Reviewed and Verified:     Possible medication to medication interactions reviewed:     Last appt @ PCP Office: @LASTVISITTHISPROVIDER @    Future Appointments   Date Time Provider Department Center   11/17/2021  3:30 PM 01/15/2022, RN [facility]       MOST RECENT BLOOD PRESSURES  BP Readings from Last 3 Encounters:   05/04/21 124/65   03/06/21 124/80   09/25/20 130/64         MOST RECENT LAB DATA  Lab Results   Component Value Date/Time    K 3.9 02/17/2021 12:41 PM    ALT 15 02/17/2021 12:41 PM    TSH 3.17 07/22/2020 08:21 AM    CHOL 107 08/08/2020 11:00 AM    HGB 12.6 02/17/2021 12:41 PM    HCT 38.4 02/17/2021 12:41 PM    HBA1CPOC 6.5 05/04/2021 12:40 PM

## 2021-11-13 NOTE — Telephone Encounter (Signed)
Pt has an appt scheduled for 1/24 at 330pm    His caretaker Is having hand surgery next week and was wondering if this appt could be moved to the first week in February to allow her more time to heal before taking him to an appointment. Please advise     [phone]

## 2021-11-16 NOTE — Telephone Encounter (Signed)
Contacted Heaven Wandell (POA and caretaker), she is going to contact [facility] to arrange for a nurse to accompany pt to his appointment as she is unsure how she will be feeling due to hand surgery. [name] will contact practice to reschedule appointment.

## 2021-11-17 ENCOUNTER — Encounter: Primary: Registered Nurse

## 2021-12-01 ENCOUNTER — Ambulatory Visit: Admit: 2021-12-01 | Discharge: 2021-12-01 | Payer: MEDICARE | Primary: Registered Nurse

## 2021-12-01 DIAGNOSIS — E1065 Type 1 diabetes mellitus with hyperglycemia: Secondary | ICD-10-CM

## 2021-12-01 LAB — AMB POC HEMOGLOBIN A1C: Hemoglobin A1C, POC: 7 %

## 2021-12-01 NOTE — Progress Notes (Signed)
[facility]  [place] [id number]    Diabetes Visit Note   diabetes, uncontrolled.     Presents for follow up diabetes visit with registered nurse/certified diabetes educator.     The purpose of today's visit is for follow-up to support with blood sugar management/education and may include teaching of various AADE education topics as applicable;   Review of current blood sugars and goals; review of recent labs related to diabetes; review of current behaviors related to diabetes management;   Assessment/education related to hypoglycemia and hyperglycemia; prevention of complications; meal planning; psychosocial needs; goal setting for behavioral change related to diabetes/chronic disease management; discussion and recommendations for insulin and or other medication initiation/titration; plan for follow up. Patient continues to receive ongoing diabetes education at subsequent clinic visits.     Pertinent patient history:    Lives [facility], staff gives injections  Altha Harm, niece helps getting him to appointments     Current Status/Assessment:  A1C 7% today at clinic    Current meds:   Basaglar 38 units AM; 10 units evening at 8 pm    Humalog 7 units with meals   Under 150, no insulin given   3 units for each 30 points over 150 with a max of 9 units per meal     SMBG 4x/day    Fasting/morning 124 161 117, 81, 105, 122, 279, 134,  Lunch time 223, 189, 81, 289, 325, 74, 209, 93, 269,  Suppertime 184, 148, 104, 263, 158, 312, 104, 117  Bedtime 167, 214, 89, 385, 146, 297, 134    Variation which may be owing to before vs. After meals; size of meals eaten; extra snacks etc  Altha Harm relates that he may be snacking in his apartment from time to time.   We reviewed symptoms/treatment of low blood sugar.   They will acquire glucose tabs to treat, rather than have extra snacks around.     He states he enjoys the food at [employer]    Blood sugar record was sent in advance  We reviewed the  blood sugars. Goal blood sugars 80-180.       Summary/Plan:  See Patient Instructions       Patient is testing blood glucose  times per day for last 60 days or uses sensor  Patient is taking 3 or more injections of insulin daily or on insulin pump  Patient requires frequent insulin adjustments based on blood sugar readings (meter or CGM)    Hypoglycemia :   Positive with unawareness  Nocturnal hypoglycemia  Carries/understands treatment for lows  Dawn phenomenon      The patient has had a doctor visit to evaluate the diabetes within the last 6 months; receives education pertaining to self management at each clinic visit.   The patient is given appropriate education/review of DKA prevention/recognition; sick day protocol; pump back up supply kit as appropriate.  The patient is given information on clinic contact information/emergency contact information    ( Recommend annual dilated eye exam; annual urine microalbumin/creat; A1C every 3-6 months; Annual comprehensive foot exam by PCP and regular self care; semi annual dental visits)  Preventive Care/Standards Reviewed with patient   Pre-Conception/pregnancy planning education provided if appropriate    Diabetic Foot Exam   Left  Visual Exam: hammertoes    Pulse DP: 2+ (normal)  Filament test: reduced sensation      Right  Visual Exam: hammertoes     Pulse DP: 2+ (normal)  Filament test: reduced sensation  Labs:    Recent labs reviewed. Meds, allergies, vitals, tobacco use reviewed.   Eye exam date reviewed/ provider contacted for clinic notes.     Lab Results   Component Value Date/Time    HBA1CPOC 6.5 05/04/2021 12:40 PM     Lab Results   Component Value Date/Time    TSH 3.17 07/22/2020 08:21 AM     No results found for: MCACR, MCA2, MCAU, MCAU2  Lab Results   Component Value Date/Time    NA 140 02/17/2021 12:41 PM    K 3.9 02/17/2021 12:41 PM    CL 104 02/17/2021 12:41 PM    CO2 23 02/17/2021 12:41 PM    BUN 17 02/17/2021 12:41 PM    GFRAA >60 02/17/2021 12:41 PM     GLOB 3.9 02/17/2021 12:41 PM    ALT 15 02/17/2021 12:41 PM        There were no vitals taken for this visit.    Labs Reviewed with patient as appropriate.     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[place])    Hypertension    Atherosclerotic heart disease of native coronary artery without angina pectoris    BPH with obstruction/lower urinary tract symptoms    COVID-19    Type 1 diabetes mellitus with hyperglycemia, with long-term current use of insulin (HCC)    Mixed hyperlipidemia    Acute hypoxemic respiratory failure due to COVID-19 (HCC)    Generalized muscle weakness    Lesion of right external ear    Presence of cardiac pacemaker     Allergies   Allergen Reactions    Shellfish Allergy Anaphylaxis     Social History     Socioeconomic History    Marital status: Single   Tobacco Use    Smoking status: Never    Smokeless tobacco: Never   Substance and Sexual Activity    Alcohol use: Never    Drug use: Never   Social History Narrative    Widowed without children. Wife died about 2013-01-26  Previous employment as a Pharmacist, hospital and principal  4 years of college       No family history on file.      Patient questions were answered; patient is in agreement to plan of care and states understanding of same.      Diabetes Care Recommendations:  Monitor blood sugars as directed   You are invited to contact or send blood sugars to [place] as needed for review  Exercise daily  Monitor portions carefully/plate method of portion control/carb counting  Cut back on starchy carbs  Stay well  hydrated by drinking at least 8 glasses of water per day  Check feet daily and wear shoes at all times  Check blood sugar before driving - must be at least 120  -Cholesterol assessment               -goals of LDL <, HDL >, triglycerides<                 -  BP goal SBP <140 and DBP < 90              -continue BP medications as instructed by provider  -ACE/ARB treatment for kidney protection              -Continue as instructed by provider  -Yearly urine for microalbuminuria recommended              -Aspirin 81 mg if you have no stomach problems or advised by provider.  -Discussed/ Gave Hypoglycemia treatment plan  -Yearly dilated eye exams  -Annual flu shot/ pneumovax   Hyperglycemia protocol reviewed  Hypoglycemia protocol reviewed as applicable  Exercise/insulin protocol reviewed as applicable  Emergency contact: [phone], ask to have Sheryl paged.    Good Website: [email or web address]     Written education materials provided as appropriate (AADE approved)  Teachback  method of return demonstration (knowledge/skills ).  Materials for education include written, verbal, visual, hands on. Take-home handouts where applicable.  The patient participated actively and asked appropriate questions; Is able to use appropriate teachback to demonstrate knowledge and skills acquired.   Patient states understand of his/her current diabetes meds/insulin and proper use.      ICD-10-CM    1. Type 1 diabetes mellitus with hyperglycemia, with long-term current use of insulin (HCC)  E10.65          No future appointments.   Martez Weiand A Jahmarion Popoff, RN  12/01/2021  >50% of visit spent counseling concerning diabetes continuation of care/education topics as appropriate    Electronically signed by Bertram Gala Teiara Baria, RN @ENCDATE @    "Voice recognition" software used for transcription of this note. I apologize for typographical errors or phrases/words that  don't make sense.

## 2021-12-01 NOTE — Patient Instructions (Addendum)
Continue Basaglar 38 units AM; 10 units PM  Humalog 7 units with meals   Under 150, no insulin given   3 units for each 30 points over 150 with a max of 9 units per meal     Diabetes Care Recommendations:    Please reach out to the diabetes center if blood sugars remain consistently above target (180)    You are encouraged to get set up on MyChart if available     Monitor blood sugars as discussed/recommended.   Exercise daily as recommended  Monitor portions carefully/plate method of portion control/carb counting  Cut back on starchy carbs  Stay well hydrated by drinking at least 8 glasses of water per day  Check feet daily and wear shoes at all times  Check blood sugar before driving - must be at least 120  Hyperglycemia and/or sick day protocol reviewed as applicable  Hypoglycemia protocol reviewed as applicable  Exercise/insulin protocol reviewed if applicable    Emergency contact (nights, weekends) [phone], ask to have [employer] paged.    Please bring blood glucose meter and updated medication list to each visit. Thanks!    Elmore Guise Shandrea Lusk, RN

## 2021-12-02 NOTE — Progress Notes (Signed)
I have reviewed the notes, assessments, and/or procedures performed by Lori Downs RN CDE, I concur with her documentation of Alejandro [name].

## 2022-01-27 ENCOUNTER — Encounter

## 2022-01-27 NOTE — Telephone Encounter (Signed)
Will defer to PCP for tommorow he can decide what labs he wants.

## 2022-01-27 NOTE — Telephone Encounter (Signed)
Forwarding request to provider for review as protocol failed.

## 2022-01-27 NOTE — Telephone Encounter (Signed)
Medications Requested:  Requested Prescriptions     Pending Prescriptions Disp Refills    rosuvastatin (CRESTOR) 10 MG tablet [Pharmacy Med Name: Rosuvastatin Calcium 10 MG Tablet] 30 tablet 11     Sig: TAKE 1 TABLET BY MOUTH AT NIGHT       Preferred Pharmacy:   [facility] - [place], [place] - [address] - [place] [phone] [name] [phone]  [address]  [place] [address]  Phone: [phone] Fax: [phone]      Date of Last Refill: 03/17/2021 for 90tab with Bethann Berkshire    Prescription Refill Protocol reviewed: Yes      Last Lipid Panel Result Recorded: 08/08/2020    Allergy List Reviewed and Verified: Yes    Possible medication to medication interactions reviewed: Yes    Last appt @ PCP Office: 03/06/2021     No Scheduled Visits with PCP    Future Appointments   Date Time Provider Department Center   05/19/2022  3:30 PM Mahala Menghini, RN [facility]       MOST RECENT BLOOD PRESSURES  BP Readings from Last 3 Encounters:   12/01/21 113/67   05/04/21 124/65   03/06/21 124/80         MOST RECENT LAB DATA  Lab Results   Component Value Date/Time    K 3.9 02/17/2021 12:41 PM    ALT 15 02/17/2021 12:41 PM    TSH 3.17 07/22/2020 08:21 AM    CHOL 107 08/08/2020 11:00 AM    HGB 12.6 02/17/2021 12:41 PM    HCT 38.4 02/17/2021 12:41 PM    HBA1CPOC 7.0 12/01/2021 03:30 PM

## 2022-01-28 MED ORDER — ROSUVASTATIN CALCIUM 10 MG PO TABS
10 MG | ORAL_TABLET | ORAL | 1 refills | Status: DC
Start: 2022-01-28 — End: 2023-04-12

## 2022-01-28 NOTE — Telephone Encounter (Signed)
Let us schedule patient for an appointment in the next 2 months.  I have ordered fasting labs.  Please let patient's family member [name] know same.  Thanks

## 2022-01-29 NOTE — Telephone Encounter (Signed)
Call to Alejandro [name] pt legal guardian appointment made for 04/23/22 at 11:30am and made him aware that he needs fasting labs drawn at [facility]

## 2022-02-09 NOTE — Telephone Encounter (Signed)
Noted will await paper

## 2022-02-09 NOTE — Telephone Encounter (Signed)
A woman from [facility] dropped off an order to be signed along with a fax cover sheet for it to be faxed back to them. I put it in the mail to [place].

## 2022-02-10 NOTE — Telephone Encounter (Signed)
Received 3 pages. Each page needs a signature. I placed in folder and put in PCP's office for completion

## 2022-02-12 NOTE — Telephone Encounter (Signed)
faxed

## 2022-02-16 MED ORDER — TAB-A-VITE PO TABS
ORAL_TABLET | Freq: Every day | ORAL | 3 refills | Status: DC
Start: 2022-02-16 — End: 2022-04-23

## 2022-02-16 NOTE — Telephone Encounter (Signed)
Let's check and make sure we have the accurate information from [place]. Thanks

## 2022-02-16 NOTE — Telephone Encounter (Signed)
Signed the multivitamin order.

## 2022-02-16 NOTE — Telephone Encounter (Signed)
[place] CRMA  calling from [facility]. She states pts glucose has been fluctuating all day and is currently 370. They gave pt 9 units of insulin 10 mins ago. I spoke with pcp and he stated to retest in 2 hours and to update [place] if it still is elevated. Pt is showing no signs of hyperglycemia. [place] is aware of pcps request.

## 2022-02-16 NOTE — Telephone Encounter (Signed)
[place] from [facility] called and stated that they need a refill on pt Tabavape sent to [facility].

## 2022-02-16 NOTE — Telephone Encounter (Signed)
Alejandro [name], from [facility] called to speak to a clinical staff member, as she states this paitents blood sugar is very high today.  Currently, it is 8 and she says he has had insulin today.  Call transferred to [facility]

## 2022-02-16 NOTE — Telephone Encounter (Signed)
I have qued up an order. Please review as I am unsure if this is correct. [facility] won't let me pull up the previously reported medication because it is not in our system.

## 2022-02-16 NOTE — Telephone Encounter (Signed)
This is being taken care of in a separate encounter.

## 2022-02-16 NOTE — Telephone Encounter (Signed)
Called and spoke with Roe Coombs (clinical staff). She states its just a multivitamin that does not have a specific dosage.

## 2022-02-22 NOTE — Telephone Encounter (Signed)
Please call [facility] to see how patient's blood glucose numbers have been doing. Thanks

## 2022-02-22 NOTE — Telephone Encounter (Signed)
This encounter is still open. Please address or sign off if complete

## 2022-02-23 NOTE — Telephone Encounter (Signed)
Bs readings have been much better today was 144 and he has been much more regulated with no high numbers again.

## 2022-02-23 NOTE — Telephone Encounter (Signed)
Noted  

## 2022-03-15 MED ORDER — LANCETS MISC
3 refills | Status: DC
Start: 2022-03-15 — End: 2023-04-12

## 2022-03-15 NOTE — Telephone Encounter (Signed)
faxed

## 2022-03-15 NOTE — Telephone Encounter (Signed)
Dawn from [facility] called. She is asking that an order be sent to [facility] for the uni stick 28 gauge lancets.

## 2022-03-23 NOTE — Telephone Encounter (Signed)
Called and spoke with [place]. She is going to fax over the patients blood sugars for the week tomorrow. She said that they are all over the place. Up and down. She stated that they do not give him sugary food either. Sending to pcp and covering provider as an [place]

## 2022-03-23 NOTE — Telephone Encounter (Signed)
At this time I would recommend continue with meds as ordered. And have them call with his sugars over the past week by tomorrow if in order to make an adjustments with meds.

## 2022-03-23 NOTE — Telephone Encounter (Signed)
Attempted to contact patient. User was busy. Will attempt again in a little bit.

## 2022-03-23 NOTE — Telephone Encounter (Signed)
Caller and Callback #: Alejandro [name] [phone]  Complaint:   Chief Complaint   Patient presents with    Blood Sugar Problem     450 and then after getting dose of Lantus and Novolog Blood Sugar only dropped to 400.       RED FLAGS - If Yes, transfer call to MA or instruct to call 911  Passed out or fainted today [] Y  [x] N   New numbness, weakness, slurred speech, confusion or vision loss [] Y  [x] N         Takes insulin [x] Y  [] N   Blood glucose greater than 300 [x] Y  [] N   Blood glucose less than 70 [] Y  [x] N   Lightheaded or dizzy [] Y  [x] N   Vomiting [] Y  [x] N   Very thirsty [] Y  [x] N   Urinating a lot more than usual [] Y  [x] N         Disposition:  [] Transferred to MA  [x] Note routed to MA pool as Urgent  [] Appt today with PCP  [] Appt today with other provider  [] Appt within 7 days with PCP  [] Appt within 7 days with other provider  [] Unable to schedule. Comments required:   [] Patient declined appointment. Comments required:

## 2022-03-30 ENCOUNTER — Emergency Department: Admit: 2022-03-30 | Payer: MEDICARE | Primary: Registered Nurse

## 2022-03-30 ENCOUNTER — Inpatient Hospital Stay
Admit: 2022-03-30 | Discharge: 2022-03-30 | Disposition: A | Discharge status: Home / Self Care | Payer: MEDICARE | Attending: Physician Assistant

## 2022-03-30 DIAGNOSIS — E1065 Type 1 diabetes mellitus with hyperglycemia: Principal | ICD-10-CM

## 2022-03-30 LAB — CBC WITH AUTO DIFFERENTIAL
Absolute Eos #: 0.4 10*3/uL (ref 0.0–0.5)
Absolute Immature Granulocyte: 0 10*3/uL (ref 0.0–0.1)
Absolute Lymph #: 2.2 10*3/uL (ref 1.0–4.5)
Absolute Mono #: 0.7 10*3/uL (ref 0.1–0.8)
Basophils Absolute: 0.1 10*3/uL (ref 0.0–0.2)
Basophils: 1 %
Eosinophils %: 5 %
Hematocrit: 42.7 % (ref 42.0–52.0)
Hemoglobin: 14.4 g/dL (ref 14.0–18.0)
Immature Granulocytes: 1 %
Lymphocytes: 25 %
MCH: 31 PG (ref 28.0–34.0)
MCHC: 33.7 g/dL (ref 32.0–36.0)
MCV: 91.8 FL (ref 80.0–100.0)
MPV: 10.9 FL (ref 7.0–12.0)
Monocytes: 8 %
Nucleated RBCs: 0 PER 100 WBC
Platelets: 154 10*3/uL (ref 150–400)
RBC: 4.65 M/uL (ref 4.50–6.00)
RDW: 12.7 % (ref 11.5–13.5)
Seg Neutrophils: 60 %
Segs Absolute: 5.1 10*3/uL (ref 1.9–7.8)
WBC: 8.5 10*3/uL (ref 4.8–10.8)
nRBC: 0 10*3/uL

## 2022-03-30 LAB — EKG 12-LEAD
Atrial Rate: 166 ms
EKG I-40 FRONT AXIS: -76 deg
EKG I-40 HORIZONTAL AXIS: 239 deg
EKG P DURATION: 65 ms
EKG P FRONT AXIS: 0 deg
EKG P HORIZONTAL AXIS: 226 deg
EKG Q ONSET: 505 ms
EKG QRS AXIS: -71 deg
EKG QRS HORIZONTAL AXIS: 238 deg
EKG QRSD INTERVAL: 172 ms
EKG QTCB: 460 ms
EKG QTCF: 454 ms
EKG RR INTERVAL: 923 ms
EKG S-T FRONT AXIS: 133 deg
EKG S-T HORIZONTAL AXIS: 70 deg
EKG T HORIZONTAL AXIS: 62 deg
EKG T WAVE AXIS: 104 deg
EKG T-40 FRONT AXIS: -71 deg
EKG T-40 HORIZONTAL AXIS: 242 deg
Heart Rate: 65 {beats}/min
P-R Interval: 84 ms
Q-T Interval: 442 ms

## 2022-03-30 LAB — COMPREHENSIVE METABOLIC PANEL
ALT: 20 U/L (ref 3–35)
AST: 27 U/L (ref 15–40)
Albumin/Globulin Ratio: 1
Albumin: 3.8 g/dL (ref 3.5–5.0)
Alk Phosphatase: 76 U/L (ref 35–100)
Anion Gap: 15 mmol/L
BUN: 27 MG/DL — ABNORMAL HIGH (ref 7–20)
Bun/Cre Ratio: 24
CO2: 24 mmol/L (ref 20–32)
Calcium: 9.4 MG/DL (ref 8.8–10.5)
Chloride: 100 mmol/L (ref 100–110)
Creatinine: 1.12 MG/DL (ref 0.40–1.20)
Est, Glom Filt Rate: >60 mL/min/{1.73_m2}
Globulin: 3.9 g/dL
Glucose: 355 mg/dL — ABNORMAL HIGH (ref 75–110)
Potassium: 4.4 mmol/L (ref 3.5–5.0)
Sodium: 135 mmol/L (ref 135–145)
Total Bilirubin: 0.8 mg/dL (ref 0.10–1.20)
Total Protein: 7.7 g/dL (ref 6.2–8.0)

## 2022-03-30 LAB — LIPASE: Lipase: 20 U/L (ref 10–58)

## 2022-03-30 LAB — TROPONIN
Troponin, High Sensitivity: 13 pg/mL (ref 0.00–20.00)
Troponin, High Sensitivity: 12 pg/mL (ref 0.00–20.00)

## 2022-03-30 LAB — URINALYSIS WITH REFLEX TO CULTURE
Bilirubin Urine: NEGATIVE
Blood, Urine: NEGATIVE
Glucose, Ur: >=1000 mg/dL — AB
Ketones, Urine: NEGATIVE mg/dL
Leukocyte Esterase, Urine: NEGATIVE
Nitrite, Urine: NEGATIVE
Protein, Urine: NEGATIVE mg/dL
Specific Gravity, UA: 1.02 (ref 1.005–1.030)
Urobilinogen, Urine: 0.2 EU/dL (ref 0.1–1.0)
pH, Urine: 5.5 (ref 5.0–9.0)

## 2022-03-30 LAB — MAGNESIUM: Magnesium: 1.8 mg/dL (ref 1.7–2.5)

## 2022-03-30 LAB — TSH: TSH: 2.575 u[IU]/mL (ref 0.340–5.600)

## 2022-03-30 LAB — KETONES, QUALITATIVE: Acetone/Ketone, Serum: NEGATIVE

## 2022-03-30 LAB — T4, FREE: T4 Free: 0.9 ng/dL (ref 0.6–1.1)

## 2022-03-30 MED ORDER — SODIUM CHLORIDE 0.9 % IV BOLUS
0.9 % | Freq: Once | INTRAVENOUS | Status: AC
Start: 2022-03-30 — End: 2022-03-30
  Administered 2022-03-30: 19:00:00 1000 mL via INTRAVENOUS

## 2022-03-30 MED FILL — SODIUM CHLORIDE 0.9 % IV SOLN: 0.9 % | INTRAVENOUS | Qty: 1000

## 2022-03-30 NOTE — Telephone Encounter (Signed)
Attempted to contact [place]. User was busy. Will try again later to get more information. Sending to pcp to make aware as well.

## 2022-03-30 NOTE — Telephone Encounter (Signed)
Patient has had a significant change in behavior such as increased for confusion - he may be having a stroke, have a urinary tract infection or another cause.  He should be assessed at emergency department as he is having stroke-like symptoms.  Please let staff know same.  Thanks

## 2022-03-30 NOTE — Telephone Encounter (Signed)
Caller and Callback #:  Britney CRMA   Complaint:   Chief Complaint   Patient presents with    Blood Sugar Problem           RED FLAGS - If Yes, transfer call to MA or instruct to call 911  Passed out or fainted today [] Y  [x] N   New numbness, weakness, slurred speech, confusion or vision loss [x] Y  [] N         Takes insulin [x] Y  [] N   Blood glucose greater than 300 [x] Y  [] N   Blood glucose less than 70 [] Y  [x] N   Lightheaded or dizzy [] Y  [x] N   Vomiting [] Y  [x] N   Very thirsty [] Y  [x] N   Urinating a lot more than usual [] Y  [x] N         Disposition:  [] Transferred to MA  [x] Note routed to MA pool as Urgent  [] Appt today with PCP  [] Appt today with other provider  [] Appt within 7 days with PCP  [] Appt within 7 days with other provider  [] Unable to schedule. Comments required:   [] Patient declined appointment. Comments required:

## 2022-03-30 NOTE — ED Triage Notes (Signed)
Patient presented to the ED from senior living (memory care unit) with concerns of 'increased confusion.' Patient has a history of dementia and baseline is unclear per EMS. EMS reported that patients blood sugar has been "up and down" per nursing home staff. [id number] per EMS.  Denies pain any pain. Denies any CP or SOB. Patient is unsure why he's here at this time.

## 2022-03-30 NOTE — Care Coordination-Inpatient (Signed)
CM reviewed chart. CM will monitor for discharge needs.

## 2022-03-30 NOTE — Progress Notes (Signed)
[facility] MED RECONCILIATION    Comments/Recommendations: Patient is a resident at [facility]. Medications updated per [facility]    Information obtained from: MAR    Allergies: Shellfish allergy    Prior to Admission Medications:     No current facility-administered medications on file prior to encounter.     Current Outpatient Medications on File Prior to Encounter   Medication Sig Dispense Refill    insulin lispro, 1 Unit Dial, (HUMALOG/ADMELOG) 100 UNIT/ML SOPN Inject 7 Units into the skin 3 times daily (with meals)      insulin glargine (LANTUS SOLOSTAR) 100 UNIT/ML injection Inject 10 Units into the skin nightly      sodium phosphate (FLEET) 7-19 GM/118ML Place 1 enema rectally daily as needed      glucagon 1 MG injection Inject 1 mg into the muscle as needed      Lancets MISC Uni stick 28 gauge lancets to use to test glucose QID 360 each 3    Multiple Vitamin (MULTIVITAMIN) tablet Take 1 tablet by mouth daily 90 tablet 3    rosuvastatin (CRESTOR) 10 MG tablet TAKE 1 TABLET BY MOUTH AT NIGHT 90 tablet 1    [DISCONTINUED] diclofenac sodium (VOLTAREN) 1 % GEL diclofenac 1 % topical gel      tamsulosin (FLOMAX) 0.4 MG capsule TAKE 1 CAPSULE BY MOUTH ONCE DAILY. 60 capsule 0    cetirizine (ZYRTEC) 10 MG tablet Take 1 tablet by mouth daily 90 tablet 1    acetaminophen (TYLENOL) 325 MG tablet Take 2 tablets by mouth every 4 hours as needed for Pain or Fever 120 tablet 0    amLODIPine (NORVASC) 10 MG tablet TAKE 1 TABLET BY MOUTH EVERY DAY FOR HIGH BLOOD PRESSURE.      apixaban (ELIQUIS) 2.5 MG TABS tablet TAKE 1 TABLET BY MOUTH TWICE DAILY.      benazepril (LOTENSIN) 10 MG tablet Take 1 tablet by mouth daily ceived the following from [facility]: Outside name: benazepriL (LOTENSIN) 10 mg tablet      bisacodyl (DULCOLAX) 10 MG suppository Place 1 suppository rectally daily as needed      donepezil (ARICEPT) 5 MG tablet Take 1 tablet by mouth nightly      ibuprofen (ADVIL;MOTRIN) 400 MG  tablet Take 1 tablet by mouth every 6 hours as needed      insulin glargine (LANTUS;BASAGLAR) 100 UNIT/ML injection pen 48 Units daily      levothyroxine (SYNTHROID) 50 MCG tablet Take 1 tablet by mouth every morning (before breakfast)      magnesium citrate (CITROMA) SOLN Take 296 mLs by mouth daily as needed      pantoprazole (PROTONIX) 40 MG tablet TAKE 1 TABLET BY MOUTH ONCE A DAY      QUEtiapine (SEROQUEL) 50 MG tablet TAKE 1 TABLET BY MOUTH TWICE DAILY.      [DISCONTINUED] insulin lispro, 1 Unit Dial, 100 UNIT/ML SOPN Inject 7-13 units breakfast, lunch, supper subq E10.65      [DISCONTINUED] magnesium hydroxide (MILK OF MAGNESIA) 400 MG/5ML suspension Take 30 mLs by mouth      [DISCONTINUED] ondansetron (ZOFRAN-ODT) 4 MG disintegrating tablet Take 4 mg by mouth every 8 hours as needed            Shaune Pascal, [facility]   Contact: [phone]

## 2022-03-30 NOTE — Discharge Instructions (Signed)
His labs are otherwise reassuring no infectious etiology no electrolyte abnormality.  CT scan chest x-ray are negative.  Continue with regular diabetic care and control.  Have him follow up with primary care doctor.

## 2022-03-30 NOTE — Telephone Encounter (Signed)
Britney the CRMA from; [facility] would like to have some feed back about the Pt's Blood Sugars.  Pt Blood sugar was 293 this morning, so she gave him his meds and she just checked again and now it's 475.    Pt is very confused, more then the normal.  He is asking about school and something about the trash cans.    Please call [facility] to discuss  [phone]

## 2022-03-30 NOTE — ED Provider Notes (Signed)
Chief Complaint   Patient presents with    Altered Mental Status       This 81 year old male with dementia from [place] reviewed in for evaluation for blood sugars being up and down foul-smelling urine.  No history of falls trauma or injury.  Does have history of type 1 diabetes on insulin.  At this point he is pleasantly demented at baseline.  He denies any falls or injuries and there were no reports of falls or injuries.  No reports of nausea vomiting or diarrhea fevers or chills.        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)    Hypertension    Atherosclerotic heart disease of native coronary artery without angina pectoris    BPH with obstruction/lower urinary tract symptoms    COVID-19    Type 1 diabetes mellitus with hyperglycemia, with long-term current use of insulin (HCC)    Mixed hyperlipidemia    Acute hypoxemic respiratory failure due to COVID-19 [facility])    Generalized muscle weakness    Lesion of right external ear    Presence of cardiac pacemaker       Past Medical History:  Past Medical History:   Diagnosis Date    Dementia (HCC)     Diabetes (HCC)     Grief     loss of wife 20-Mar-2013    Hypertension     Presence of cardiac pacemaker        No past surgical history on file.    Social History     Socioeconomic History    Marital status: Single     Spouse name: Not on file    Number of children: Not on file    Years of education: Not on file    Highest education level: Not on file   Occupational History    Not on  file   Tobacco Use    Smoking status: Never    Smokeless tobacco: Never   Substance and Sexual Activity    Alcohol use: Never    Drug use: Never    Sexual activity: Not on file   Other Topics Concern    Not on file   Social History Narrative    Widowed without children. Wife died about 03-20-2013  Previous employment as a Runner, broadcasting/film/video and principal  4 years of college       Social Determinants of Health     Financial Resource Strain: Not on file   Food Insecurity: Not on file   Transportation Needs: Not on file   Physical Activity: Not on file   Stress: Not on file   Social Connections: Not on file   Intimate Partner Violence: Not on file   Housing Stability: Not on file       Family History:  No family history on file.    Medications:  Patient medication list reviewed  Previous Medications    ACETAMINOPHEN (TYLENOL) 325 MG  TABLET    Take 2 tablets by mouth every 4 hours as needed for Pain or Fever    AMLODIPINE (NORVASC) 10 MG TABLET    TAKE 1 TABLET BY MOUTH EVERY DAY FOR HIGH BLOOD PRESSURE.    APIXABAN (ELIQUIS) 2.5 MG TABS TABLET    TAKE 1 TABLET BY MOUTH TWICE DAILY.    BENAZEPRIL (LOTENSIN) 10 MG TABLET    Take 1 tablet by mouth daily ceived the following from [facility]: Outside name: benazepriL (LOTENSIN) 10 mg tablet    BISACODYL (DULCOLAX) 10 MG SUPPOSITORY    Place 1 suppository rectally daily as needed    CETIRIZINE (ZYRTEC) 10 MG TABLET    Take 1 tablet by mouth daily    DONEPEZIL (ARICEPT) 5 MG TABLET    Take 1 tablet by mouth nightly    GLUCAGON 1 MG INJECTION    Inject 1 mg into the muscle as needed    IBUPROFEN (ADVIL;MOTRIN) 400 MG TABLET    Take 1 tablet by mouth every 6 hours as needed    INSULIN GLARGINE (LANTUS SOLOSTAR) 100 UNIT/ML INJECTION    Inject 10 Units into the skin nightly    INSULIN GLARGINE (LANTUS;BASAGLAR) 100 UNIT/ML INJECTION PEN    48 Units daily    INSULIN LISPRO, 1 UNIT DIAL, (HUMALOG/ADMELOG) 100 UNIT/ML SOPN    Inject 7 Units into the skin 3 times daily (with meals)     LANCETS MISC    Uni stick 28 gauge lancets to use to test glucose QID    LEVOTHYROXINE (SYNTHROID) 50 MCG TABLET    Take 1 tablet by mouth every morning (before breakfast)    MAGNESIUM CITRATE (CITROMA) SOLN    Take 296 mLs by mouth daily as needed    MULTIPLE VITAMIN (MULTIVITAMIN) TABLET    Take 1 tablet by mouth daily    PANTOPRAZOLE (PROTONIX) 40 MG TABLET    TAKE 1 TABLET BY MOUTH ONCE A DAY    QUETIAPINE (SEROQUEL) 50 MG TABLET    TAKE 1 TABLET BY MOUTH TWICE DAILY.    ROSUVASTATIN (CRESTOR) 10 MG TABLET    TAKE 1 TABLET BY MOUTH AT NIGHT    SODIUM PHOSPHATE (FLEET) 7-19 GM/118ML    Place 1 enema rectally daily as needed    TAMSULOSIN (FLOMAX) 0.4 MG CAPSULE    TAKE 1 CAPSULE BY MOUTH ONCE DAILY.       Anticoagulants / Antiplatelet medications:  apixaban Tabs - 2.5 MG    Allergies:    Shellfish allergy    Review of Systems   All other systems reviewed and are negative.  See history of present illness for relevant review of systems.    ED Triage Vitals [03/30/22 1341]   BP Temp Temp src Pulse Respirations SpO2 Height Weight - Scale   (!) 146/73 98.2 F (36.8 C) -- 65 18 98 %  (1.753 m) 180 lb (81.6 kg)     Physical Exam  Vitals and nursing note reviewed.   Constitutional:       General: He is not in acute distress.     Appearance: Normal appearance. He is normal weight. He is not ill-appearing, toxic-appearing or diaphoretic.   HENT:      Head: Normocephalic and atraumatic.      Right Ear: External ear normal.      Left Ear: External ear normal.      Nose: Nose normal.      Mouth/Throat:  Mouth: Mucous membranes are moist.      Pharynx: Oropharynx is clear.   Eyes:      General: No scleral icterus.     Extraocular Movements: Extraocular movements intact.      Right eye: Normal extraocular motion and no nystagmus.      Left eye: Normal extraocular motion and no nystagmus.      Conjunctiva/sclera: Conjunctivae normal.      Pupils: Pupils are equal, round, and reactive to light. Pupils are equal.       Right eye: Pupil is round and reactive.      Left eye: Pupil is round and reactive.   Neck:      Meningeal: Brudzinski's sign and Kernig's sign absent.   Cardiovascular:      Rate and Rhythm: Normal rate and regular rhythm.      Heart sounds: No murmur heard.    No friction rub. No gallop.   Pulmonary:      Effort: Pulmonary effort is normal. No respiratory distress.      Breath sounds: Normal breath sounds. No stridor. No wheezing, rhonchi or rales.   Chest:      Chest wall: No tenderness.   Abdominal:      General: Bowel sounds are normal. There is no distension.      Palpations: Abdomen is soft. There is no mass.      Tenderness: There is no abdominal tenderness. There is no guarding.   Musculoskeletal:         General: No swelling or tenderness. Normal range of motion.      Cervical back: Normal range of motion and neck supple. No rigidity.   Lymphadenopathy:      Cervical: No cervical adenopathy.   Skin:     General: Skin is warm and dry.      Findings: No erythema or rash.   Neurological:      Mental Status: He is alert. Mental status is at baseline.      GCS: GCS eye subscore is 4. GCS verbal subscore is 5. GCS motor subscore is 6.      Cranial Nerves: No cranial nerve deficit, dysarthria or facial asymmetry.      Sensory: No sensory deficit.      Motor: No weakness.      Coordination: Romberg sign negative. Coordination normal.      Gait: Gait normal.      Deep Tendon Reflexes: Reflexes normal.      Comments: Oriented to self and place pleasantly demented at baseline.  No focal neurological deficits.   Psychiatric:         Mood and Affect: Mood normal.         Speech: Speech normal.         Behavior: Behavior normal.       Medical Decision Making  This 81 year old with advanced dementia from a local memory unit here for evaluation of altered mental status she then uncontrollable sugars seem to be fluctuating, and some foul-smelling urine.  He does not exhibit any outward signs of trauma or injury or any  evidence of an acute emergency medical condition.  He is pleasantly demented at his baseline.  He is neurologically intact without any focal deficits.  Has an unremarkable cardiopulmonary exam and a benign nonfocal abdominal exam.  But given his advanced dementia and just inability to gives an accurate HPI will obtain CT scan of head to rule out intracranial pathology, chest x-ray to rule pneumonia, labs  to rule out any metabolic derangement or infectious etiology.  I reviewed interpreted his EKG is a ventricular paced rhythm at 65 beats per minute.    Amount and/or Complexity of Data Reviewed  Labs: ordered.  Radiology: ordered.  ECG/medicine tests: ordered.    Risk  Prescription drug management.              ED Course:  ED Course as of 03/30/22 1642   Tue Mar 30, 2022   1454 I reviewed interpreted his labs, troponin within the reference range at 13, no anemia, bandemia or thrombocytopenia.  His chemistries remarkable for glucose at 3:55 a.m. that appears where he had his baseline is BUNs 27, his anion gap is negative.  Sodium potassium are normal remainder of labs are unremarkable.  Thyroid studies are normal.  Awaiting for imaging studies.  Will need to repeat a 2nd troponin to ensure that is flat stable.  Will hydrate with IV fluids and collect urinalysis. [JG]   1510 Ketones are negative.  So far his workup is negative reassuring.  Awaiting for urinalysis.  I reviewed interpreted the CT scan of head by Radiology which is remarkable for no acute intracranial process that is identified hemorrhage mass effect or edema.  I reviewed interpreted the chest x-ray by Radiology is remarkable for chronic obstructive lung disease some scattered areas of pulmonary fibrosis but no evidence of acute lung parenchymal disease.  Moderate cardiomegaly and the pacemaker is visible.  Otherwise awaiting for the urine at this point otherwise workup is negative. [JG]   1641 I reviewed the repeat troponin that is negative at 12 and  flat urine is negative for any infectious etiology.  So far no explanation for his symptoms.  Other than hyperglycemia consistent with his history of type 1 diabetes, no evidence of DKA.  No infection, CT scan is negative he will be discharged back to the memory unit. [JG]      ED Course User Index  [JG] Royetta CarJesus M Miyu Fenderson, [place]       Procedures    Vital Signs for this visit:  Vitals:    03/30/22 1341   BP: (!) 146/73   Pulse: 65   Resp: 18   Temp: 98.2 F (36.8 C)   SpO2: 98%   Weight: 180 lb (81.6 kg)   Height: 5\' 9"  (1.753 m)       Lab findings during this visit (only abnormal values will be noted, if no value noted then the result was normal range):  Labs Reviewed   COMPREHENSIVE METABOLIC PANEL - Abnormal; Notable for the following components:       Result Value    Glucose 355 (*)     BUN 27 (*)     All other components within normal limits   URINALYSIS WITH REFLEX TO CULTURE - Abnormal; Notable for the following components:    Glucose, Ur >=1000 (*)     All other components within normal limits   CBC WITH AUTO DIFFERENTIAL   LIPASE   MAGNESIUM   TROPONIN   TROPONIN   T4, FREE   TSH   KETONES, QUALITATIVE       Recent radiology studies including this visit:  CT Head W/O Contrast   Final Result   No acute intracranial process identified. No acute intracranial hemorrhage, edema, or mass effect.         XR CHEST (2 VW)   Final Result   1. Chronic obstructive lung disease.  Scattered areas of  pulmonary fibrosis.  No evidence of acute lung parenchymal disease.   2. Moderate cardiomegaly.  Pacemaker.             Medications given in the ED:  Medications   0.9 % sodium chloride bolus (1,000 mLs IntraVENous New Bag 03/30/22 1506)       Diagnosis:  1. Type 1 diabetes mellitus with hyperglycemia, with long-term current use of insulin (HCC)            Condition at disposition:  stable    DISPOSITION Decision To Discharge 03/30/2022 04:41:37 PM        Discharge prescriptions and/or changes if applicable:       Medication  List        CONTINUE taking these medications      Lancets Misc  Uni stick 28 gauge lancets to use to test glucose QID            ASK your doctor about these medications      acetaminophen 325 MG tablet  Commonly known as: TYLENOL  Take 2 tablets by mouth every 4 hours as needed for Pain or Fever     amLODIPine 10 MG tablet  Commonly known as: NORVASC     apixaban 2.5 MG Tabs tablet  Commonly known as: ELIQUIS     benazepril 10 MG tablet  Commonly known as: LOTENSIN     bisacodyl 10 MG suppository  Commonly known as: DULCOLAX     cetirizine 10 MG tablet  Commonly known as: ZYRTEC  Take 1 tablet by mouth daily     donepezil 5 MG tablet  Commonly known as: ARICEPT     glucagon 1 MG injection     ibuprofen 400 MG tablet  Commonly known as: ADVIL;MOTRIN     * insulin glargine 100 UNIT/ML injection  Commonly known as: LANTUS SOLOSTAR     * insulin glargine 100 UNIT/ML injection pen  Commonly known as: LANTUS;BASAGLAR     insulin lispro (1 Unit Dial) 100 UNIT/ML Sopn  Commonly known as: HUMALOG/ADMELOG  Ask about: Which instructions should I use?     levothyroxine 50 MCG tablet  Commonly known as: SYNTHROID     magnesium citrate Soln  Commonly known as: CITROMA     multivitamin tablet  Take 1 tablet by mouth daily     pantoprazole 40 MG tablet  Commonly known as: PROTONIX     QUEtiapine 50 MG tablet  Commonly known as: SEROQUEL     rosuvastatin 10 MG tablet  Commonly known as: CRESTOR  TAKE 1 TABLET BY MOUTH AT NIGHT     sodium phosphate 7-19 GM/118ML     tamsulosin 0.4 MG capsule  Commonly known as: FLOMAX  TAKE 1 CAPSULE BY MOUTH ONCE DAILY.           * This list has 2 medication(s) that are the same as other medications prescribed for you. Read the directions carefully, and ask your doctor or other care provider to review them with you.                  Follow-up if applicable:  Mellody Drown, APRN - NP  [address] [place] [address]  [phone]            Please note that portions of this document were created using  the M*Modal Fluency Direct dictation system.  Any inconsistencies or typographical errors may be the result of mis-transcription that persist in spite of proof-reading and  should be addressed with the document creator.      [place], [place]  03/30/22 1642

## 2022-03-30 NOTE — Telephone Encounter (Signed)
Called [place] back and relayed the message. His most recent glucose was around the 400's. They will bring him to the ED.

## 2022-03-31 NOTE — Telephone Encounter (Signed)
Pt was seen in the ER pt has appt on 6/16 with you..      Patient presented to the ED from senior living (memory care unit) with concerns of 'increased confusion.' Patient has a history of dementia and baseline is unclear per EMS. EMS reported that patients blood sugar has been "up and down" per nursing home staff. [id number] per EMS.  Denies pain any pain. Denies any CP or SOB. Patient is unsure why he's here at this time

## 2022-03-31 NOTE — Telephone Encounter (Signed)
Patient needs hospital follow up appointment.

## 2022-04-01 NOTE — Telephone Encounter (Signed)
Called to [place]. She said that they do not do sliding scales there. The rx she has has a sliding scale. I advised the rx we have is to inject 7 units into the skin 3x daily with meals. I advised that I am going to double check with Eddie Dibbles and get back to her.

## 2022-04-01 NOTE — Telephone Encounter (Signed)
[place] from [facility] stated that they need clarification on the dosage for insulin lispro, 1 Unit Dial, (HUMALOG/ADMELOG) 100 UNIT/ML SOPN   She also stated they will not be doing a sliding scale. Please call them back to discuss.     Cb [id number]

## 2022-04-01 NOTE — Telephone Encounter (Signed)
Continue as currently ordered   on our medication list, 7 units subcutaneously 3 x day with meals.

## 2022-04-01 NOTE — Telephone Encounter (Signed)
Called to [place]. Spoke with [place]. She is aware of this and asked if I could send over something with this info.     Letter created and faxed to [phone].

## 2022-04-07 NOTE — Telephone Encounter (Signed)
Please review, encounter still open.  If no action is needed, sign encounter and close.

## 2022-04-17 IMAGING — DX DG CHEST 1V
1 series · 1 of 1 positions shown · non-contrast
Comparison: None.

CLINICAL DATA: Wound infection

EXAM:
CHEST  1 VIEW

[chest ap]
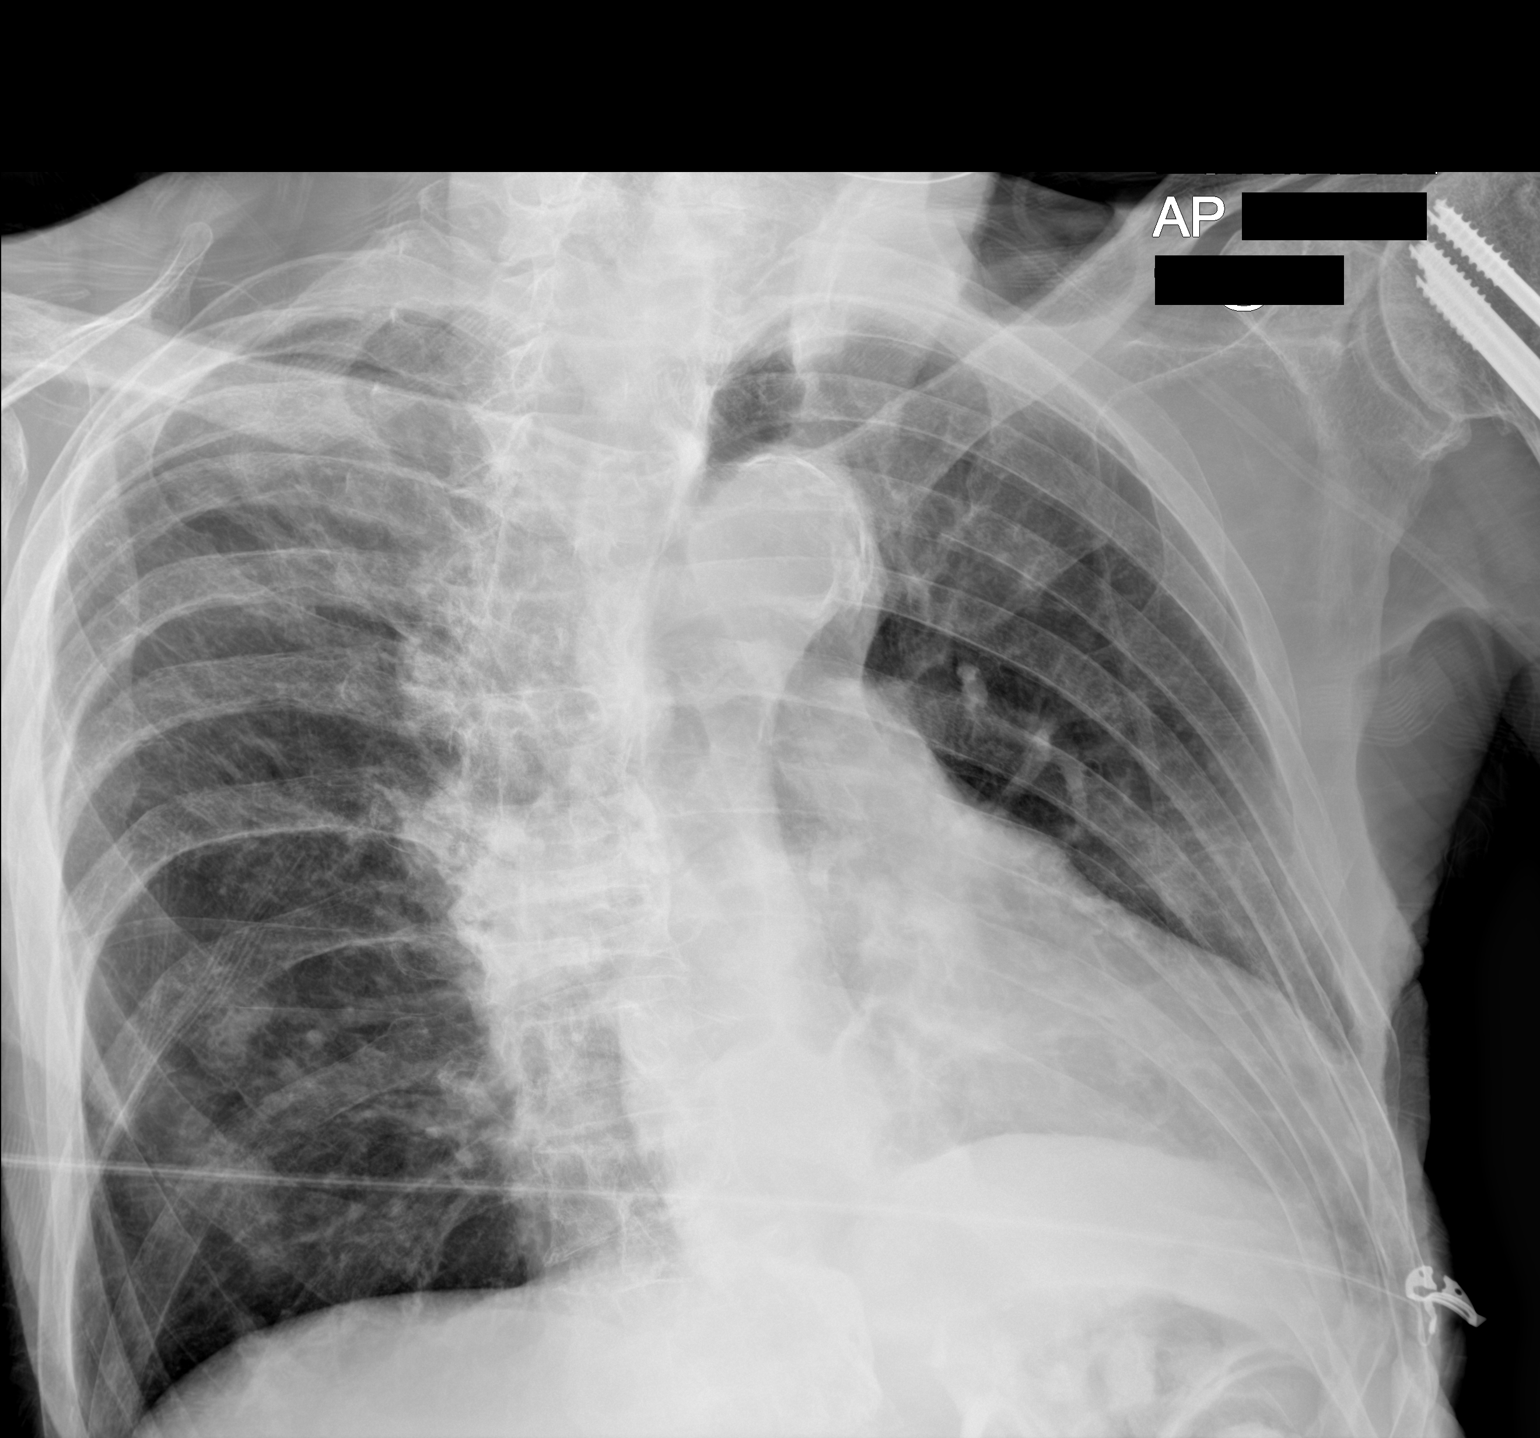

[1 of 1 positions shown; findings below may reference images not displayed]

FINDINGS: The heart size and mediastinal contours are within normal limits.
Both lungs are clear. The visualized skeletal structures are
unremarkable.
IMPRESSION: No active disease.

## 2022-04-23 ENCOUNTER — Ambulatory Visit: Admit: 2022-04-23 | Discharge: 2022-04-26 | Payer: MEDICARE | Attending: Registered Nurse | Primary: Registered Nurse

## 2022-04-23 DIAGNOSIS — Z9189 Other specified personal risk factors, not elsewhere classified: Secondary | ICD-10-CM

## 2022-04-23 DIAGNOSIS — E782 Mixed hyperlipidemia: Secondary | ICD-10-CM

## 2022-04-23 NOTE — Assessment & Plan Note (Signed)
Ordered physical therapy for strengthening, gait improvement, endurance with [facility] physical therapy

## 2022-04-23 NOTE — Assessment & Plan Note (Signed)
Will continue Aricept   Patient receives close supervision and assistance with ADLs at nursing home

## 2022-04-23 NOTE — Assessment & Plan Note (Signed)
Ordered hemoglobin A1c  Will request [facility] RN obtain same in patient's nursing home

## 2022-04-23 NOTE — Assessment & Plan Note (Signed)
Ordered skin assessments with [facility]

## 2022-04-23 NOTE — Patient Instructions (Addendum)
Will see patient in 3 months for follow-up, high risk for impaired skin integrity, muscle weakness, insulin-dependent diabetes mellitus, dementia, hypertension, hyperlipidemia, hypothyroidism.    Ordered skilled nursing visits for skin assessments, obtain labs with [facility] RNs.      Also ordered physical therapy with [facility] for patient's muscle weakness-physical therapy for strengthening, gait improvement, endurance improvement.    Will request [facility] RNs obtain fasting labs at nursing home where patient is a resident.

## 2022-04-23 NOTE — Assessment & Plan Note (Signed)
Ordered thyroid cascade   Will request [facility] RN obtain same in patient's nursing home

## 2022-04-23 NOTE — Assessment & Plan Note (Signed)
Ordered CMP  Will request [facility] RN obtain same patient's nursing home

## 2022-04-23 NOTE — Progress Notes (Signed)
[facility] INTERNAL MEDICINE   [address]  [place] [address]  Alejandro [name]  1941/10/30      Impression/Plan:Will see patient in 3 months for follow-up, high risk for impaired skin integrity, muscle weakness, insulin-dependent diabetes mellitus, dementia, hypertension, hyperlipidemia, hypothyroidism.    Ordered skilled nursing visits for skin assessments, obtain labs with [facility] RNs.      Also ordered physical therapy with [facility] for patient's muscle weakness-physical therapy for strengthening, gait improvement, endurance improvement.    Will request [facility]Ns obtain fasting labs at nursing home where patient is a resident.    1. At high risk for impaired skin integrity  Assessment & Plan:   Ordered skin assessments with [facility] RN  Orders:  -     Referral to [facility]  2. Muscle weakness  Assessment & Plan:   Ordered physical therapy for strengthening, gait improvement, endurance with [facility] physical therapy  Orders:  -     Referral to [facility]  3. Type 1 diabetes mellitus with hyperglycemia, with long-term current use of insulin (HCC)  Assessment & Plan:   Ordered hemoglobin A1c  Will request [facility] RN obtain same in patient's nursing home  Orders:  -     Hemoglobin A1C; Future  4. Dementia associated with other underlying disease with behavioral disturbance [facility])  Assessment & Plan:   Will continue Aricept   Patient receives close supervision and assistance with ADLs at nursing home  5. Primary hypertension  Assessment & Plan:   Ordered CMP  Will request [facility] RN obtain same patient's nursing home  Orders:  -     Comprehensive Metabolic Panel; Future  6. Mixed hyperlipidemia  Assessment & Plan:   Ordered fasting lipid panel  Will request [facility] RN obtain same in patient's nursing home  Orders:  -     Lipid Panel; Future  7. Hypothyroidism,  unspecified type  Assessment & Plan:   Ordered thyroid cascade   Will request [facility] RN obtain same in patient's nursing home  Orders:  -     Thyroid Cascade Profile; Future       Chief Compliant: Alejandro [name]  81 y.o. male presents today for   Chief Complaint   Patient presents with    Follow-up     Hospital follow up         HPI:      Dementia  Presents with his niece Alejandro [name] who is his medical POA  Patient reports he is doing well  Enjoys his meals at nursing home    Muscle weakness  Reports he would like to Improve strength standing up,he is in a wheelchair today  Receptive to having home health physical therapy    Insulin dependent DM  All meds administered by nursing home staff    High risk for impaired skin integrity  Sits a lot during the day  Has bony prominences - concern for maintaining skin integrity  Challenged with dementia  Challenged with insulin dependent DM  Hx of multiple falls  Receptive to home health RN visiting      Patient and his niece Alejandro [name] receptive to having labs drawn at nursing home by [facility] RN        Current Outpatient Medications   Medication Instructions    acetaminophen (TYLENOL) 650 mg, Oral, EVERY  4 HOURS PRN    amLODIPine (NORVASC) 10 MG tablet TAKE 1 TABLET BY MOUTH EVERY DAY FOR HIGH BLOOD PRESSURE.    apixaban (ELIQUIS) 2.5 MG TABS tablet TAKE 1 TABLET BY MOUTH TWICE DAILY.    benazepril (LOTENSIN) 10 mg, Oral, DAILY, ceived the following from [facility]: Outside name: benazepriL (LOTENSIN) 10 mg tablet    bisacodyl (DULCOLAX) 10 mg, Rectal, DAILY PRN    cetirizine (ZYRTEC) 10 mg, Oral, DAILY    donepezil (ARICEPT) 5 mg, Oral, NIGHTLY    glucagon 1 MG injection 1 kit, IntraMUSCular, PRN    ibuprofen (ADVIL;MOTRIN) 400 mg, Oral, EVERY 6 HOURS PRN    insulin glargine (LANTUS SOLOSTAR) 10 Units, SubCUTAneous, NIGHTLY    insulin glargine (LANTUS;BASAGLAR) 100 UNIT/ML injection pen 48 Units daily    insulin lispro (1 Unit Dial)  (HUMALOG/ADMELOG) 7 Units, SubCUTAneous, 3 TIMES DAILY WITH MEALS    Lancets MISC Uni stick 28 gauge lancets to use to test glucose QID    levothyroxine (SYNTHROID) 50 mcg, Oral, DAILY BEFORE BREAKFAST    magnesium citrate (CITROMA) SOLN 296 mLs, DAILY PRN    pantoprazole (PROTONIX) 40 MG tablet TAKE 1 TABLET BY MOUTH ONCE A DAY    QUEtiapine (SEROQUEL) 50 MG tablet TAKE 1 TABLET BY MOUTH TWICE DAILY.    rosuvastatin (CRESTOR) 10 MG tablet TAKE 1 TABLET BY MOUTH AT NIGHT    sodium phosphate (FLEET) 7-19 GM/118ML 1 enema, Rectal, DAILY PRN    tamsulosin (FLOMAX) 0.4 MG capsule TAKE 1 CAPSULE BY MOUTH ONCE DAILY.        Allergies   Allergen Reactions    Shellfish Allergy Anaphylaxis              There is no immunization history on file for this patient.       Review of Systems     See HPI      Vitals:    04/23/22 1157   BP: (!) 124/58   Site: Left Upper Arm   Position: Sitting   Cuff Size: Medium Adult   Pulse: 65   Resp: 16   SpO2: 96%   Weight: 180 lb (81.6 kg)   Height: 5\' 9"  (1.753 m)       Physical Exam  Constitutional:       General: He is not in acute distress.     Appearance: Normal appearance. He is not ill-appearing, toxic-appearing or diaphoretic.   HENT:      Head: Normocephalic and atraumatic.      Nose: Nose normal.   Eyes:      General: No scleral icterus.        Right eye: No discharge.         Left eye: No discharge.      Conjunctiva/sclera: Conjunctivae normal.   Pulmonary:      Effort: Pulmonary effort is normal.   Musculoskeletal:      Comments: Patient presents in wheelchair  Muscle weakness   Skin:     General: Skin is dry.      Comments: At risk for developing pressure ulcers/having skin breakdown as patient spends majority of the day sitting; also at risk of skin breakdown due to comorbidities of dementia, insulin-dependent diabetes mellitus, history of multiple falls, mobility challenges.   Neurological:      Mental Status: He is alert. Mental status is at baseline.      Motor: Weakness  present.   Psychiatric:  Mood and Affect: Mood normal.      Comments: Positive affect  Neatly dressed smiles and laughs at times  Appropriate in conversation  At cognitive baseline with advancing dementia      I reviewed and the patient's Past Medical, Surgical, Family and Social History in addition to their Problems, Meds and Allergies.  Recent results reviewed  Any problems listed in the Assessment and Plan were assessed during today's visit and if not explicitly discussed are stable based on history, physical exam, review of pertinent labs, studies and medications.   Total visit time 30 minutes.      No follow-up provider specified.  Future Appointments   Date Time Provider Department Center   05/19/2022  3:30 PM Mahala Menghini, RN [facility]       Mellody Drown, APRN - NP      [facility] INTERNAL MEDICINE   [address] [place] [address]

## 2022-04-23 NOTE — Assessment & Plan Note (Signed)
Ordered fasting lipid panel  Will request [facility] RN obtain same in patient's nursing home

## 2022-05-03 NOTE — Telephone Encounter (Signed)
Alan Ripper from home health called and left a vm on call service patient was evaluated by them today and the nurse advises he should use his walker at all times and she gave hime exercise program to work on at home

## 2022-05-06 ENCOUNTER — Inpatient Hospital Stay: Admit: 2022-05-06 | Payer: MEDICARE | Primary: Registered Nurse

## 2022-05-06 DIAGNOSIS — E782 Mixed hyperlipidemia: Secondary | ICD-10-CM

## 2022-05-06 LAB — COMPREHENSIVE METABOLIC PANEL
ALT: 25 U/L (ref 3–35)
AST: 26 U/L (ref 15–40)
Albumin/Globulin Ratio: 1.1
Albumin: 3.7 g/dL (ref 3.5–5.0)
Alk Phosphatase: 90 U/L (ref 35–100)
Anion Gap: 12 mmol/L
BUN: 20 MG/DL (ref 7–20)
Bun/Cre Ratio: 22
CO2: 22 mmol/L (ref 20–32)
Calcium: 9.3 MG/DL (ref 8.8–10.5)
Chloride: 108 mmol/L (ref 100–110)
Creatinine: 0.91 MG/DL (ref 0.40–1.20)
Est, Glom Filt Rate: >60 mL/min/{1.73_m2}
Globulin: 3.5 g/dL
Glucose: 280 mg/dL — ABNORMAL HIGH (ref 75–110)
Potassium: 4.8 mmol/L (ref 3.5–5.0)
Sodium: 137 mmol/L (ref 135–145)
Total Bilirubin: 0.7 mg/dL (ref 0.10–1.20)
Total Protein: 7.2 g/dL (ref 6.2–8.0)

## 2022-05-06 LAB — THYROID CASCADE PROFILE: TSH, 3RD GENERATION: 3.577 u[IU]/mL (ref 0.340–5.600)

## 2022-05-06 LAB — LIPID PANEL
Cholesterol: 150 MG/DL (ref 115–200)
HDL: 47 MG/DL (ref 40–60)
LDL Cholesterol: 84.6 MG/DL (ref 65–130)
Non-HDL Cholesterol: 103 mg/dL
Triglycerides: 92 MG/DL (ref 30–150)

## 2022-05-06 NOTE — Telephone Encounter (Signed)
Beth from [facility] called and said she was only able to get the blood draw for the lipid, cmp, and tsh. Pt would not let her stick him again. She said he is also being discharge from [facility] today where ot and pt did not pick him up and there were no nursing needs.   Cb:[phone]

## 2022-05-18 NOTE — Telephone Encounter (Signed)
Please review, encounter still open.  If no action is needed, sign encounter and close.

## 2022-05-18 NOTE — Telephone Encounter (Signed)
Attempt 1, lvm. Alejandro [name] is listed on PHI from 2022.

## 2022-05-18 NOTE — Telephone Encounter (Signed)
[facility]rien [facility]) called to cancel pt's 07/12 apt with Lawson [facility]cal. Needs to r/s/. Shuttle runs on Tues/Thurs and would be the best days to set up pt with a new apt     Please CB [name] to R/S: [phone]

## 2022-05-19 ENCOUNTER — Ambulatory Visit: Payer: MEDICARE | Primary: Registered Nurse

## 2022-05-19 NOTE — Telephone Encounter (Signed)
Called [name] and left a voicemail letting her know when the next available appointment is with [facility] Fiscal.    I scheduled it for 07/22/2022 at 8:00

## 2022-05-19 NOTE — Progress Notes (Unsigned)
[facility]  [address] [address]    Diabetes Visit Note    Alejandro [name]  is a 81 y.o. male with type 1  diabetes, uncontrolled.     Presents for follow up diabetes visit with registered nurse/certified diabetes educator.     The purpose of today's visit is for follow-up to support with blood sugar management/education and may include teaching of various AADE education topics as applicable;   Review of current blood sugars and goals; review of recent labs related to diabetes; review of current behaviors related to diabetes management;   Assessment/education related to hypoglycemia and hyperglycemia; prevention of complications; meal planning; psychosocial needs; goal setting for behavioral change related to diabetes/chronic disease management; discussion and recommendations for insulin and or other medication initiation/titration; plan for follow up.  Patient completed diabetes education program when diagnosed and continues to receive ongoing diabetes education at subsequent clinic visits.       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; multiple falls;      Current Status/Assessment:  A1C    Current   Basaglar  Humalog      Hypoglycemia :   ***  Positive with unawareness  Nocturnal hypoglycemia  Carries/understands treatment for lows  Dawn phenomenon    Plan:  ***      Also See Patient Instructions      Uploads:  ***      Patient is testing blood glucose  times per day for last 60 days or uses sensor  Patient is taking 3 or more injections of insulin daily or on insulin pump  Patient requires frequent insulin adjustments based on blood sugar readings (meter or CGM)        For CGM/Pump patients:    Patient demonstrates understanding of the use of CGM  Patient currently checks blood glucose  levels 4 or more times a day.  Patient currently injects insulin 3 or more times per day or is currently using an insulin pump.  Unexpected and significant blood sugar fluctuations despite therapeutic insulin adjustments  Patient's insulin treatment regimen requires frequent self-initiated /or automated insulin adjustments.   By using CGM therapy improvement to self care is evident by:  Improvement of measurable indicators, such as A1C  Time in hypoglycemia improved  Time in hyperglycemia improved  Increased ability to avoid hypoglycemia    Patient is utilizing CGM/sensor therapy as recommended; patient makes adjustments to therapy outcomes improved with sensor use; continued use of sensor therapy medically necessary/recommended.    The patient has had a clinic visit to evaluate the diabetes plan within the last 6 months; has follow up visit at least every 6 months; receives education pertaining to self management at each clinic visit.   The patient is given appropriate education/review of DKA prevention/recognition; sick day protocol; pump back up supply kit as appropriate.  The patient is given information on clinic contact information/emergency contact information    ( Recommend annual dilated eye exam; annual urine microalbumin/creat; A1C every 3-6 months; Annual comprehensive foot exam by PCP and regular self care; semi annual dental visits)  Preventive Care/Standards Reviewed with patient   Pre-Conception/pregnancy planning education provided if appropriate    Diabetic Foot Exam   Left  Visual Exam: {Foot Inspection :37963::"normal"}  {Additional Visual Findings:54380}  Pulse DP: {Foot Pulse DP:37964::"2+ (normal)"}  Filament test: {EXAM NEUROFILAMENT TEST:37965::"normal sensation"}  {Vibratory Sensation (Optional):37966::"normal"}    Right  Visual Exam: {Foot Inspection :37963::"normal"}  {Additional Visual Findings:54380}  Pulse DP: {Foot Pulse DP:37964::"2+ (normal)"}  Filament test: {EXAM NEUROFILAMENT  TEST:37965::"normal sensation"}  {Vibratory Sensation (Optional):37966::"normal"}    Labs:    Recent labs reviewed. Meds, allergies, vitals, tobacco use reviewed.   Eye exam date reviewed/ provider contacted for clinic notes.     Lab Results   Component Value Date/Time    HBA1CPOC 7.0 12/01/2021 03:30 PM     Lab Results   Component Value Date/Time    TSH 2.575 03/30/2022 02:00 PM     No results found for: [facility], MCA2, MCAU, MCAU2  Lab Results   Component Value Date/Time    NA 137 05/06/2022 07:30 AM    K 4.8 05/06/2022 07:30 AM    CL 108 05/06/2022 07:30 AM    CO2 22 05/06/2022 07:30 AM    BUN 20 05/06/2022 07:30 AM    GFRAA >60 02/17/2021 12:41 PM    GLOB 3.5 05/06/2022 07:30 AM    ALT 25 05/06/2022 07:30 AM        There were no vitals taken for this visit.    Labs Reviewed with patient as appropriate.     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)    Hypertension    Atherosclerotic heart disease of native coronary artery without angina pectoris    BPH with obstruction/lower urinary tract symptoms    COVID-19    Type 1 diabetes mellitus with hyperglycemia, with long-term current use of insulin (HCC)    Mixed hyperlipidemia    Acute hypoxemic respiratory failure due to COVID-19 [facility])    Muscle weakness    Lesion of right external ear    Presence of cardiac pacemaker    Dementia associated with other underlying disease with behavioral disturbance (HCC)    At high risk for impaired skin integrity    Hypothyroidism     Allergies   Allergen Reactions    Shellfish  Allergy Anaphylaxis     Social History     Socioeconomic History    Marital status: Widowed   Tobacco Use    Smoking status: Never    Smokeless tobacco: Never   Substance and Sexual Activity    Alcohol use: Never    Drug use: Never   Social History Narrative    Widowed without children. Wife died about 2013/10/28  Previous employment as a Runner, broadcasting/film/video and principal  4 years of college       No family history on file.      Patient questions were answered; patient is in agreement to plan of care and states understanding of same.      Diabetes Care Recommendations:  Monitor blood sugars as directed   You are invited to contact or send blood sugars to [place] as needed for review  Exercise daily  Monitor portions carefully/plate method of portion control/carb counting  Cut  back on starchy carbs  Stay well hydrated by drinking at least 8 glasses of water per day  Check feet daily and wear shoes at all times  Check blood sugar before driving - must be at least 120  -Cholesterol assessment               -goals of LDL <, HDL >, triglycerides<                 -BP goal SBP <140 and DBP < 90              -continue BP medications as instructed by provider  -ACE/ARB treatment for kidney protection              -Continue as instructed by provider  -Yearly urine for microalbuminuria recommended              -Aspirin 81 mg if you have no stomach problems or advised by provider.  -Discussed/ Gave Hypoglycemia treatment plan  -Yearly dilated eye exams  -Annual flu shot/ pneumovax   Hyperglycemia protocol reviewed  Hypoglycemia protocol reviewed as applicable  Exercise/insulin protocol reviewed as applicable  Emergency contact: [phone], ask to have Sheryl paged.    Good Website: [email or web address]     Written education materials provided as appropriate (AADE approved)  Teachback  method of return demonstration (knowledge/skills ).  Materials for education include written, verbal, visual, hands on. Take-home handouts where applicable.  The  patient participated actively and asked appropriate questions; Is able to use appropriate teachback to demonstrate knowledge and skills acquired.   Patient states understand of his/her current diabetes meds/insulin and proper use.      ICD-10-CM    1. Type 1 diabetes mellitus with hyperglycemia, with long-term current use of insulin (HCC)  E10.65          Future Appointments   Date Time Provider Department Center   05/19/2022  3:30 PM Mahala Menghini, RN [facility]   07/22/2022  8:00 AM Mahala Menghini, RN [facility]   07/27/2022  2:00 PM Mellody Drown, APRN - NP [facility]      Elmore Guise Londyn Hotard, RN  05/19/2022  >50% of visit spent counseling concerning diabetes continuation of care/education topics as appropriate    Electronically signed by Elmore Guise Lanelle Lindo, RN @[facility] @    "Voice recognition" software used for transcription of this note. I apologize for typographical errors or phrases/words that  don't make sense.

## 2022-05-26 NOTE — Telephone Encounter (Signed)
Please review, encounter still open.  If no action is needed, sign encounter and close.

## 2022-06-09 NOTE — Telephone Encounter (Signed)
Called [name] his daugher who is the POA and on the permission to speak. To have her call us about his appointment on 07/22/2022. The provider is going to be out of the office that day so we are needing to reschedule the appointment.    I have cancelled it if she calls back please get her back over to [place] so that we can get his scheduled

## 2022-06-15 MED ORDER — HUMALOG KWIKPEN 100 UNIT/ML SC SOPN
100 UNIT/ML | SUBCUTANEOUS | 4 refills | Status: DC
Start: 2022-06-15 — End: 2023-01-12

## 2022-06-15 NOTE — Telephone Encounter (Signed)
Medications Requested:  Requested Prescriptions     Pending Prescriptions Disp Refills   . HUMALOG KWIKPEN 100 UNIT/ML SOPN [Pharmacy Med Name: HumaLOG KwikPen 100 UNIT/ML Solution pen-injector] 45 mL 4     Sig: INJECT 7-13 UNITS UNDER THE SKIN AT [place], LUNCH, AND SUPPER       Preferred Pharmacy:   [facility] [place], [place] - [address] - [place] [phone] [name] [id number]  [address]  [place] [address]  Phone: [phone] Fax: [phone]          Prescription Refill Protocol reviewed:  Yes    Allergy List Reviewed and Verified: Yes    Possible medication to medication interactions reviewed: Yes    Last appt at PCP Office: 06/10/2020       MOST RECENT BLOOD PRESSURES  BP Readings from Last 3 Encounters:   04/23/22 (!) 124/58   03/30/22 127/83   12/01/21 113/67         MOST RECENT LAB DATA  Lab Results   Component Value Date/Time    K 4.8 05/06/2022 07:30 AM    ALT 25 05/06/2022 07:30 AM    TSH 2.575 03/30/2022 02:00 PM    CHOL 150 05/06/2022 07:30 AM    CHOL 107 08/08/2020 11:00 AM    HGB 14.4 03/30/2022 02:00 PM    HCT 42.7 03/30/2022 02:00 PM    HBA1CPOC 7.0 12/01/2021 03:30 PM

## 2022-06-19 ENCOUNTER — Inpatient Hospital Stay
Admit: 2022-06-19 | Discharge: 2022-06-19 | Disposition: A | Discharge status: Home / Self Care | Payer: MEDICARE | Attending: Physician Assistant

## 2022-06-19 DIAGNOSIS — E10649 Type 1 diabetes mellitus with hypoglycemia without coma: Principal | ICD-10-CM

## 2022-06-19 DIAGNOSIS — E162 Hypoglycemia, unspecified: Secondary | ICD-10-CM

## 2022-06-19 LAB — URINALYSIS WITH REFLEX TO CULTURE
Bilirubin Urine: NEGATIVE
Blood, Urine: NEGATIVE
Glucose, Ur: 100 mg/dL — AB
Ketones, Urine: NEGATIVE mg/dL
Leukocyte Esterase, Urine: NEGATIVE
Nitrite, Urine: NEGATIVE
Protein, Urine: NEGATIVE mg/dL
Specific Gravity, UA: 1.02 (ref 1.005–1.030)
Urobilinogen, Urine: 0.2 EU/dL (ref 0.1–1.0)
pH, Urine: 5.5 (ref 5.0–9.0)

## 2022-06-19 LAB — CBC WITH AUTO DIFFERENTIAL
Absolute Eos #: 0.2 10*3/uL (ref 0.0–0.5)
Absolute Immature Granulocyte: 0.1 10*3/uL (ref 0.0–0.1)
Absolute Mono #: 0.7 10*3/uL (ref 0.1–0.8)
Basophils Absolute: 0.1 10*3/uL (ref 0.0–0.2)
Basophils: 1 %
Eosinophils %: 2 %
Hematocrit: 40 % — ABNORMAL LOW (ref 42.0–52.0)
Hemoglobin: 13 g/dL — ABNORMAL LOW (ref 14.0–18.0)
Immature Granulocytes: 1 %
Lymphocytes Absolute: 1.3 10*3/uL (ref 1.0–4.5)
Lymphocytes: 14 %
MCH: 30.6 PG (ref 28.0–34.0)
MCHC: 32.5 g/dL (ref 32.0–36.0)
MCV: 94.1 FL (ref 80.0–100.0)
MPV: 11.3 FL (ref 7.0–12.0)
Monocytes: 8 %
Nucleated RBCs: 0 PER 100 WBC
Platelets: 147 10*3/uL — ABNORMAL LOW (ref 150–400)
RBC: 4.25 M/uL — ABNORMAL LOW (ref 4.50–6.00)
RDW: 12.5 % (ref 11.5–13.5)
Seg Neutrophils: 74 %
Segs Absolute: 7.3 10*3/uL (ref 1.9–7.8)
WBC: 9.6 10*3/uL (ref 4.8–10.8)
nRBC: 0 10*3/uL

## 2022-06-19 LAB — COMPREHENSIVE METABOLIC PANEL
ALT: 23 U/L (ref 3–35)
AST: 23 U/L (ref 15–40)
Albumin/Globulin Ratio: 1
Albumin: 3.6 g/dL (ref 3.5–5.0)
Alk Phosphatase: 79 U/L (ref 35–100)
Anion Gap: 8 mmol/L
BUN: 22 MG/DL — ABNORMAL HIGH (ref 7–20)
Bun/Cre Ratio: 23
CO2: 24 mmol/L (ref 20–32)
Calcium: 8.8 MG/DL (ref 8.8–10.5)
Chloride: 109 mmol/L (ref 100–110)
Creatinine: 0.96 MG/DL (ref 0.40–1.20)
Est, Glom Filt Rate: >60 mL/min/{1.73_m2}
Globulin: 3.5 g/dL
Glucose: 156 mg/dL — ABNORMAL HIGH (ref 75–110)
Potassium: 3.7 mmol/L (ref 3.5–5.0)
Sodium: 137 mmol/L (ref 135–145)
Total Bilirubin: 0.6 mg/dL (ref 0.10–1.20)
Total Protein: 7.1 g/dL (ref 6.2–8.0)

## 2022-06-19 LAB — POCT GLUCOSE
POC Glucose: 199 mg/dL
POC Glucose: 145 mg/dL

## 2022-06-19 NOTE — ED Notes (Signed)
PO intake tolerated well. Dinner tray provided     Odessa Fleming, RN  06/19/22 [phone]

## 2022-06-19 NOTE — ED Triage Notes (Signed)
Patient comes in from [facility]. Patient has had poor appetite over the last several days and has not been eating. Staff states he has been uncooperative with care. Patient is diabetic and has been receiving insulin. Upon EMS arrival patient had blood glucose in the 40's. Currently Dextrose is infusing through IV.

## 2022-06-19 NOTE — ED Notes (Signed)
[name] and 8oz milk provided after being okay'd by provider. BGL 145     Alejandro [name], [place]  06/19/22 [phone]

## 2022-06-19 NOTE — ED Notes (Addendum)
PT BGL measured at 199. Post infusion of EMS d10     Odessa Fleming, RN  06/19/22 1537       Odessa Fleming, RN  06/19/22 1556

## 2022-06-19 NOTE — ED Notes (Signed)
Report provided to [facility] paramedic Daryl Eastern answered all questions.      Odessa Fleming, RN  06/19/22 [phone]

## 2022-06-19 NOTE — ED Notes (Signed)
Attempted to contact [facility] for a courtesy notification that EMS will be returning PT, but did not answer      Odessa Fleming, RN  06/19/22 1835

## 2022-06-19 NOTE — Discharge Instructions (Signed)
Patient's blood sugar level has been corrected he has been monitored in the ED without significant changes  Patient's lab work was reassuring vital signs were within acceptable limits   Recommend plenty of rest fluids over-the-counter medications as directed for symptoms unless otherwise contraindicated  Discussed with patient, recommend contacting your PCP as soon as possible to schedule follow-up appointment   May return to the ER for severe symptoms

## 2022-06-19 NOTE — ED Provider Notes (Signed)
Chief Complaint   Patient presents with    Fatigue    Hypoglycemia       HPI  Patient is a 81 year old male with a history of type 1 diabetes, dementia currently lives at when or [facility] memory care unit, staff reported patient with poor appetite over the last few days staff also reported patient uncooperative, EMS was called to transfer patient to the ED, EMS reported that patient's blood glucose level was 48mg /dL, patient was given IV dextrose prior to arrival  Patient with no further subjective complaint there has been no reported fall trauma or other injury  When asked, patient states that he did not feel good after eating a few days ago    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)    Hypertension    Atherosclerotic heart disease of native coronary artery without angina pectoris    BPH with obstruction/lower urinary tract symptoms    COVID-19    Type 1 diabetes mellitus with hyperglycemia, with long-term current use of insulin (HCC)    Mixed hyperlipidemia    Acute hypoxemic respiratory failure due to COVID-19 [facility])    Muscle weakness    Lesion of right external ear    Presence of cardiac pacemaker    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 No pertinent surgical history.    Social History     Socioeconomic History    Marital status: Widowed     Spouse name: Not on file    Number of children: Not on file    Years of education: Not on file    Highest education level: Not on file   Occupational History    Not on file   Tobacco Use    Smoking status: Never    Smokeless tobacco: Never   Substance and Sexual Activity    Alcohol use: Never    Drug use: Never    Sexual activity: Not on file   Other Topics Concern    Not on file   Social History Narrative    Widowed without children. 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 No pertinent family history.    Medications:  Patient medication list reviewed  Previous Medications    ACETAMINOPHEN (TYLENOL) 325 MG TABLET    Take 2 tablets by mouth every 4 hours as needed for Pain or Fever    AMLODIPINE (NORVASC) 10 MG TABLET    TAKE 1 TABLET BY MOUTH EVERY DAY FOR HIGH BLOOD PRESSURE.    APIXABAN (ELIQUIS) 2.5 MG TABS TABLET    TAKE 1 TABLET BY MOUTH TWICE DAILY.    BENAZEPRIL (LOTENSIN) 10 MG TABLET    Take 1 tablet by mouth daily ceived the following from [facility]: Outside name: benazepriL (LOTENSIN) 10 mg tablet    BISACODYL (DULCOLAX) 10 MG SUPPOSITORY    Place 1 suppository rectally daily as needed    CETIRIZINE (ZYRTEC) 10 MG TABLET    Take 1 tablet by mouth daily    DONEPEZIL (ARICEPT) 5 MG TABLET    Take 1 tablet by mouth nightly    GLUCAGON 1 MG INJECTION    Inject 1 mg into the muscle as needed    HUMALOG KWIKPEN 100 UNIT/ML SOPN    INJECT 7-13 UNITS UNDER THE SKIN  AT BREAKFAST, LUNCH, AND SUPPER    IBUPROFEN (ADVIL;MOTRIN) 400 MG TABLET    Take 1 tablet by mouth every 6 hours as needed    INSULIN GLARGINE (LANTUS SOLOSTAR) 100 UNIT/ML INJECTION    Inject 10 Units into the skin nightly    INSULIN GLARGINE (LANTUS;BASAGLAR) 100 UNIT/ML INJECTION PEN    48 Units daily    LANCETS MISC    Uni stick 28 gauge lancets to use to test glucose QID    LEVOTHYROXINE (SYNTHROID) 50 MCG TABLET    Take 1 tablet by mouth every morning (before breakfast)    MAGNESIUM CITRATE (CITROMA) SOLN    Take 296 mLs by mouth daily as needed    PANTOPRAZOLE (PROTONIX) 40 MG TABLET    TAKE 1 TABLET BY MOUTH ONCE A DAY    QUETIAPINE (SEROQUEL) 50 MG TABLET    TAKE 1 TABLET BY MOUTH TWICE DAILY.    ROSUVASTATIN (CRESTOR) 10 MG TABLET    TAKE 1 TABLET BY MOUTH AT NIGHT    SODIUM PHOSPHATE (FLEET) 7-19 GM/118ML    Place 1 enema rectally daily as needed    TAMSULOSIN (FLOMAX) 0.4 MG CAPSULE    TAKE 1 CAPSULE BY MOUTH ONCE DAILY.       Anticoagulants / Antiplatelet medications:  apixaban Tabs - 2.5 MG    Allergies:    Shellfish allergy    Review of Systems  See history of present illness for relevant review of systems.    ED Triage Vitals [06/19/22 1517]   BP Temp Temp Source Pulse Respirations SpO2 Height Weight   130/66 98.1 F (36.7 C) Oral 65 16 97 % 5\' 9"  (1.753 m) --     Physical Exam  Vitals and nursing note reviewed.   Constitutional:       General: He is not in acute distress.     Appearance: Normal appearance. He is normal weight. He is not ill-appearing, toxic-appearing or diaphoretic.   Eyes:      Pupils: Pupils are equal, round, and reactive to light.   Cardiovascular:      Rate and Rhythm: Normal rate and regular rhythm.  Pulses: Normal pulses.   Pulmonary:      Effort: Pulmonary effort is normal.   Abdominal:      General: There is no distension.      Palpations: There is no mass.      Tenderness: There is no abdominal tenderness. There is no right CVA tenderness, left CVA tenderness,  guarding or rebound.   Musculoskeletal:         General: No swelling, tenderness, deformity or signs of injury. Normal range of motion.      Right lower leg: No edema.      Left lower leg: No edema.   Skin:     General: Skin is warm.      Capillary Refill: Capillary refill takes less than 2 seconds.      Coloration: Skin is not jaundiced or pale.      Findings: No bruising, erythema, lesion or rash.   Neurological:      General: No focal deficit present.      Mental Status: He is alert. Mental status is at baseline.   Psychiatric:         Mood and Affect: Mood normal.         Behavior: Behavior normal.       Medical Decision Making  Patient presents to ED as stated above   Patient is a type 1 diabetic with history of dementia was reportedly not eating well at the memory care unit, per EMS his blood sugar was low, patient given IV dextrose prior to arrival   Will plan to obtain labs, assess patient's hemodynamic status   Will monitor patient and recheck blood glucose level    Amount and/or Complexity of Data Reviewed  Labs: ordered. Decision-making details documented in ED Course.                  ED Course:  ED Course as of 06/19/22 1654   Sat Jun 19, 2022   1617 WBC: 9.6 [MC]   1651 Reviewed results of labs, which at this time patient has been monitored after given IV dextrose, his blood sugar level is stable with the last check at 145mg /dL    Patient's vital signs are within acceptable limits he does not have a leukocytosis significant anemia or electrolyte abnormality kidney and liver function was within acceptable limits patient's urinalysis negative for evidence of a UTI   Recommend plenty of rest fluids over-the-counter medications as directed for symptoms unless otherwise contraindicated   Continue to monitor patient's blood sugar level, recommend close follow-up with PCP and return to the ER for any new or worsening symptoms [MC]      ED Course User Index  [MC] , PA       Procedures    Vital  Signs for this visit:  Vitals:    06/19/22 1517   BP: 130/66   Pulse: 65   Resp: 16   Temp: 98.1 F (36.7 C)   TempSrc: Oral   SpO2: 97%   Height: 5\' 9"  (1.753 m)       Lab findings that I have personally reviewed and interpreted during this visit (only abnormal values will be noted, if no value noted then the result was normal range):  Labs Reviewed   CBC WITH AUTO DIFFERENTIAL - Abnormal; Notable for the following components:       Result Value    RBC 4.25 (*)     Hemoglobin 13.0 (*)     Hematocrit 40.0 (*)  Platelets 147 (*)     All other components within normal limits   COMPREHENSIVE METABOLIC PANEL - Abnormal; Notable for the following components:    Glucose 156 (*)     BUN 22 (*)     All other components within normal limits   URINALYSIS WITH REFLEX TO CULTURE - Abnormal; Notable for the following components:    Glucose, Ur 100 (*)     All other components within normal limits   POCT GLUCOSE   POCT GLUCOSE   POCT GLUCOSE       Recent radiology studies including this visit.  I have personally reviewed these studies and my personal interpretation, if available, is documented in the ED Course:  No orders to display       Medications given in the ED:  Medications - No data to display    Diagnosis:  1. Hypoglycemia            Condition at disposition:  stable    DISPOSITION Decision To Discharge 06/19/2022 04:53:11 PM      Discharge prescriptions and/or changes if applicable:       Medication List        CONTINUE taking these medications      Lancets Misc  Uni stick 28 gauge lancets to use to test glucose QID            ASK your doctor about these medications      acetaminophen 325 MG tablet  Commonly known as: TYLENOL  Take 2 tablets by mouth every 4 hours as needed for Pain or Fever     amLODIPine 10 MG tablet  Commonly known as: NORVASC     apixaban 2.5 MG Tabs tablet  Commonly known as: ELIQUIS     benazepril 10 MG tablet  Commonly known as: LOTENSIN     bisacodyl 10 MG suppository  Commonly known as:  DULCOLAX     cetirizine 10 MG tablet  Commonly known as: ZYRTEC  Take 1 tablet by mouth daily     donepezil 5 MG tablet  Commonly known as: ARICEPT     glucagon 1 MG injection     HumaLOG KwikPen 100 UNIT/ML Sopn  Generic drug: insulin lispro (1 Unit Dial)  INJECT 7-13 UNITS UNDER THE SKIN AT BREAKFAST, LUNCH, AND SUPPER     ibuprofen 400 MG tablet  Commonly known as: ADVIL;MOTRIN     * insulin glargine 100 UNIT/ML injection  Commonly known as: LANTUS SOLOSTAR     * insulin glargine 100 UNIT/ML injection pen  Commonly known as: LANTUS;BASAGLAR     levothyroxine 50 MCG tablet  Commonly known as: SYNTHROID     magnesium citrate Soln  Commonly known as: CITROMA     pantoprazole 40 MG tablet  Commonly known as: PROTONIX     QUEtiapine 50 MG tablet  Commonly known as: SEROQUEL     rosuvastatin 10 MG tablet  Commonly known as: CRESTOR  TAKE 1 TABLET BY MOUTH AT NIGHT     sodium phosphate 7-19 GM/118ML     tamsulosin 0.4 MG capsule  Commonly known as: FLOMAX  TAKE 1 CAPSULE BY MOUTH ONCE DAILY.           * This list has 2 medication(s) that are the same as other medications prescribed for you. Read the directions carefully, and ask your doctor or other care provider to review them with you.  Follow-up if applicable:  Mellody DrownPaul J Cramm, APRN - NP  [address]  [place] [id number]  [phone]    Schedule an appointment as soon as possible for a visit       [facility]  [address]  [place] [phone]  [phone]    If symptoms worsen      Please note that portions of this document were created using the M*Modal Fluency Direct dictation system.  Any inconsistencies or typographical errors may be the result of mis-transcription that persist in spite of proof-reading and should be addressed with the document creator.        Cristie HemMatthew Bayla Mcgovern, [place]  06/19/22 1654

## 2022-06-24 NOTE — Telephone Encounter (Signed)
Called to [place], [place]. She is on the consent to speak. If/when pateint calls back, okay for non-clinical staff to give the message to patient.       Please schedule patient for a hospital follow up visit with an available provider. Thanks!

## 2022-06-24 NOTE — Telephone Encounter (Signed)
Patient seen in ER on 8/12 at [facility]. Please advise if you would like to have patient seen sooner than 9/19 appointment.

## 2022-06-24 NOTE — Telephone Encounter (Addendum)
Let us try and get patient in for an earlier appointment for follow-up.  Thanks

## 2022-06-25 NOTE — Telephone Encounter (Signed)
Patient message sent as mychart is active.

## 2022-06-28 NOTE — Telephone Encounter (Signed)
Alejandro [name] at [facility] requested most recent OV note for a pending [name] referral requested from [facility] for PT.  Note faxed to [phone].

## 2022-06-28 NOTE — Telephone Encounter (Signed)
I have faxed this.

## 2022-06-28 NOTE — Telephone Encounter (Signed)
Please send office visit from 04/23/2022.  Thanks

## 2022-07-01 NOTE — Telephone Encounter (Signed)
Called to [facility]. She is aware and will add this to the referral.

## 2022-07-01 NOTE — Telephone Encounter (Signed)
If patient is receptive he can also have PT for strengthening and balance improvement.

## 2022-07-01 NOTE — Telephone Encounter (Signed)
Correct, nurse and physical therapist. Thanks

## 2022-07-01 NOTE — Telephone Encounter (Signed)
Called to [place]. She just wants to clarify that Renae Fickle would like a nurse and PT for patient if patient is ok with that. She just wants to update the referral to reflect what the patient needs.

## 2022-07-01 NOTE — Telephone Encounter (Signed)
Melony from [facility] called and would like some clarification.   They received a call from [facility] where pt lives regarding having PT due to falls.  She is wondering does Renae Fickle think pt needs nursing or PT or both.  Please call her back at  [phone]

## 2022-07-08 NOTE — Telephone Encounter (Signed)
FYI  Amber from [facility] stated she opened a start of care today for PT and nursing.

## 2022-07-08 NOTE — Telephone Encounter (Signed)
noted 

## 2022-07-15 NOTE — Telephone Encounter (Signed)
Grenada from [facility] called and said the pt blood sugar has been getting low, like in the 70s everyday. And he is supposed to get novalog and she wants to know if they can get an order stating that if it gets to a certain number they dont have to get that medication.    Please advise  [phone]  fax- [phone]

## 2022-07-21 NOTE — Telephone Encounter (Addendum)
[name] from [facility] called requesting that I use her words exactley:    "Could we have blood sugar parameters for administering Humalog? The facility has been holding multiple doses per week, and are uncomfortable giving Humalog when his blood sugar is in the 70's or 80's.  An order stating "hold insulin if blood sugar is below 80" is requested.    Also an order for a barrier cream to be applied to the buttocks twice daily as needed is also requested.    She state she will call back for an update next week and let the provider know how many times the patients was below the parameter.      [facility] Fax: [phone]    Delane Ginger Cell [phone]    She requested the note be sent urgently

## 2022-07-22 ENCOUNTER — Encounter: Payer: MEDICARE | Primary: Registered Nurse

## 2022-07-26 NOTE — Telephone Encounter (Signed)
Letter has been created and given to PCP to sign.

## 2022-07-26 NOTE — Telephone Encounter (Signed)
Alejandro Reichert, FNP  Hazeline Junker, MA; [facility] 2 days ago     TB  Draw up a letter to hold his insulin when his BS is less than 75

## 2022-07-26 NOTE — Telephone Encounter (Signed)
This has been signed and faxed to [facility].

## 2022-07-27 ENCOUNTER — Encounter: Payer: MEDICARE | Attending: Registered Nurse | Primary: Registered Nurse

## 2022-07-27 NOTE — Telephone Encounter (Signed)
I have faxed blood sugar note to [facility]. Eddie Dibbles, please advise on barrier cream

## 2022-07-27 NOTE — Telephone Encounter (Signed)
Alejandro [name] from [facility] called back in regards to this stating that [name] never received the fax for the parameters of blood sugar.     [name] also mentioned that the order for barrier cream will need to be sent to the facility with specific instructions.     Please advise.

## 2022-07-29 NOTE — Telephone Encounter (Signed)
An order has been created, printed and signed. Please fax

## 2022-07-29 NOTE — Telephone Encounter (Signed)
This has been faxed.

## 2022-08-03 NOTE — Telephone Encounter (Signed)
Elmyra Ricks from [facility] called  08/03/22 at  5:34 am  patient had an unwitnessed fall in shower and has a bruise on right shoulder [phone]

## 2022-08-03 NOTE — Telephone Encounter (Signed)
Called and pt is not complaining of any pain and seems to be fine    They will monitor and call back if shoulder worsens.

## 2022-08-09 NOTE — Telephone Encounter (Signed)
faxed

## 2022-08-09 NOTE — Telephone Encounter (Signed)
Noted.  Let us see if we can set up virtual visit with patient and his niece.  Thank you

## 2022-08-09 NOTE — Telephone Encounter (Signed)
Kathlee Nations from [facility] called in regards to the pt and stated that they need progress notes on the pt for medicare for his diabetic testing supplies.    Fax [phone]

## 2022-08-09 NOTE — Telephone Encounter (Addendum)
Called to [place] ([facility]). No answer and left a voicemail to call back. If/when pateint calls back, okay for non-clinical staff to give the message to patient.      Please schedule patient for a virtual visit to follow up from fall and increased confusion with any available provider. Thanks!

## 2022-08-09 NOTE — Telephone Encounter (Signed)
Bentler had a fall today (10/01) second fall within 3 days. More confused, refused hospital visit for the falljocia

## 2022-08-10 NOTE — Telephone Encounter (Signed)
[name] called back. She spoke with Timmothy Sours at [facility] and the appt works.     She provided the email to Timmothy Sours and Altha Harm (herself) to send to video link to.     [email or web address]  [name]: [email or web address]

## 2022-08-10 NOTE — Telephone Encounter (Signed)
She also wanted me to let Eddie Dibbles know that she is awaiting a call back from one of the nurses about possibly putting the patient on hospice care.

## 2022-08-10 NOTE — Telephone Encounter (Signed)
These links have been emailed.

## 2022-08-10 NOTE — Telephone Encounter (Signed)
Noted-will discuss with [name] at 10/10 appointment

## 2022-08-10 NOTE — Telephone Encounter (Signed)
Spoke with [name].

## 2022-08-10 NOTE — Telephone Encounter (Signed)
Called to [place]. [facility]) Scheduled patient for  a follow up on 10/10 at 10:30. She is going to call [facility] and confirm that this is a good date and time with them as she has had a recent covid exposure and will not be allowed in the facility. She will call us back to confirm and with a good email address to send the virtual visit link to.

## 2022-08-17 ENCOUNTER — Ambulatory Visit: Admit: 2022-08-17 | Discharge: 2022-08-17 | Payer: MEDICARE | Attending: Registered Nurse | Primary: Registered Nurse

## 2022-08-17 DIAGNOSIS — F03C18 Unspecified dementia, severe, with other behavioral disturbance: Secondary | ICD-10-CM

## 2022-08-17 NOTE — Assessment & Plan Note (Signed)
Patient has a supportive care environment on dementia unit at assisted living facility  Reinforced with staff to call our office with any concerns or need for any orders  Will see patient in 3 months for follow-up virtual visit

## 2022-08-17 NOTE — Patient Instructions (Signed)
Will see patient in 3 months for virtual visit for follow-up, advancing dementia, insulin-dependent diabetes mellitus, falls.

## 2022-08-17 NOTE — Progress Notes (Signed)
Patients Med tech will be on the video with patient.

## 2022-08-17 NOTE — Assessment & Plan Note (Signed)
Patient is closely monitored on dementia unit by staff at assisted living facility  Staff will continue to contact office for any future falls  Follow-up in 3 months

## 2022-08-17 NOTE — Progress Notes (Signed)
[facility]   [place] [address]  Epimenio Schetter  [birth year]    Provider in office, patient and staff member Tanzania at assisted living facility in [place].  Total visit time 22 minutes.  Audio +video visit    Impression/Plan:  Patient in 3 months for follow-up with virtual visit, advancing dementia, insulin-dependent diabetes mellitus, frequent falls.    1. Severe dementia with other behavioral disturbance, unspecified dementia type [facility])  Assessment & Plan:   Patient has a supportive care environment on dementia unit at assisted living facility  Reinforced with staff to call our office with any concerns or need for any orders  Will see patient in 3 months for follow-up virtual visit  2. Multiple falls  Assessment & Plan:   Patient is closely monitored on dementia unit by staff at assisted living facility  Staff will continue to contact office for any future falls  Follow-up in 3 months       Chief Compliant: Montel Culver  81 y.o. male presents today for   Chief Complaint   Patient presents with    Fall        HPI:    Progressing dementia  Speaking with Tanzania who is a caregiver on the dementia unit at assisted living facility where patient lives.  Tanzania tells me patient's dementia is progressing.  Spoke with patient during visit who is quite pleasant however did exhibit signs of cognitive decline-was not as engaged in conversation as much as with previous visits.  Tanzania tells me patient has been eating approximately 90% of his food with meals.  Blood glucose levels have been better controlled recently.    Multiple falls  Tanzania reports dementia is worsening    Last fall was Tuesday of last week  Patient is what is closely by staff at dementia unit      Current Outpatient Medications   Medication Instructions    acetaminophen (TYLENOL) 650 mg, Oral, EVERY 4 HOURS PRN    amLODIPine (NORVASC) 10 MG tablet TAKE 1 TABLET BY MOUTH EVERY DAY FOR HIGH BLOOD PRESSURE.     apixaban (ELIQUIS) 2.5 MG TABS tablet TAKE 1 TABLET BY MOUTH TWICE DAILY.    BD AUTOSHIELD DUO 30G X 5 MM MISC No dose, route, or frequency recorded.    benazepril (LOTENSIN) 10 mg, Oral, DAILY, ceived the following from [facility]: Outside name: benazepriL (LOTENSIN) 10 mg tablet    bisacodyl (DULCOLAX) 10 mg, Rectal, DAILY PRN    cetirizine (ZYRTEC) 10 mg, Oral, DAILY    donepezil (ARICEPT) 5 mg, Oral, NIGHTLY    glucagon 1 MG injection 1 kit, IntraMUSCular, PRN    HUMALOG KWIKPEN 100 UNIT/ML SOPN INJECT 7-13 UNITS UNDER THE SKIN AT BREAKFAST, LUNCH, AND SUPPER    ibuprofen (ADVIL;MOTRIN) 400 mg, Oral, EVERY 6 HOURS PRN    insulin glargine (LANTUS SOLOSTAR) 10 Units, SubCUTAneous, NIGHTLY    insulin glargine (LANTUS;BASAGLAR) 100 UNIT/ML injection pen 48 Units daily    Lancets MISC Uni stick 28 gauge lancets to use to test glucose QID    levothyroxine (SYNTHROID) 50 mcg, Oral, DAILY BEFORE BREAKFAST    magnesium citrate (CITROMA) SOLN 296 mLs, Oral, DAILY PRN    Multiple Vitamins-Iron (TAB-A-VITE/IRON) TABS No dose, route, or frequency recorded.    pantoprazole (PROTONIX) 40 MG tablet TAKE 1 TABLET BY MOUTH ONCE A DAY    QUEtiapine (SEROQUEL) 50 MG tablet TAKE 1 TABLET BY MOUTH TWICE DAILY.  rosuvastatin (CRESTOR) 10 MG tablet TAKE 1 TABLET BY MOUTH AT NIGHT    sodium phosphate (FLEET) 7-19 GM/118ML 1 enema, Rectal, DAILY PRN    tamsulosin (FLOMAX) 0.4 MG capsule TAKE 1 CAPSULE BY MOUTH ONCE DAILY.        Allergies   Allergen Reactions    Shellfish Allergy Anaphylaxis              There is no immunization history on file for this patient.       Review of Systems     See HPI      There were no vitals filed for this visit.    Physical Exam   Virtual visit - no physical exam        I reviewed and the patient's Past Medical, Surgical, Family and Social History in addition to their Problems, Meds and Allergies.  Recent results reviewed  Any problems listed in the Assessment and Plan were assessed  during today's visit and if not explicitly discussed are stable based on history, physical exam, review of pertinent labs, studies and medications.  Total visit time 22 minutes.        No follow-up provider specified.  No future appointments.    Abbey Chatters, APRN - NP      [facility]   [place] [address]

## 2022-09-06 MED ORDER — PRODIGY NO CODING BLOOD GLUC VI STRP
11 refills | Status: DC
Start: 2022-09-06 — End: 2022-09-07

## 2022-09-06 NOTE — Telephone Encounter (Signed)
Noted  

## 2022-09-06 NOTE — Telephone Encounter (Signed)
Refills sent

## 2022-09-06 NOTE — Telephone Encounter (Signed)
Tanzania from [facility] left this message with the service on Sat night    Caller reports pt had an unaccompanied fall. Caller reports no apparent injury.

## 2022-09-06 NOTE — Telephone Encounter (Signed)
Contacted [name] [facility]), she stated she is currently taking care of mother on hospice. Unable to to do anything else. Will return call to reschedule 9/14 appt.

## 2022-09-07 MED ORDER — PRODIGY NO CODING BLOOD GLUC VI STRP
11 refills | Status: AC
Start: 2022-09-07 — End: 2022-09-22

## 2022-09-07 NOTE — Telephone Encounter (Signed)
I spoke to Alejandro [name], Alejandro [name] niece, whom I have met before. I explained that his insurance is requiring office notes within the last 6 months to be sent to [facility] in order to supply glucose testing strips 4x/day.  Medicare  only allows 3 times/day on insulin without a PA/office notes  .   I last saw Alejandro [name] here January/2023.  Alejandro [name] believes that with Alejandro [name]'s increasing dementia and trouble with mobility it is not llikely that he can make physical appointments into the Diabetes Clinic.  As an Therapist, sports, I am unable to do virtual visits. I will forward this note to his PCP, who I am hoping can take over from this point with diabetes management for Alejandro [name]. I believe his last visit with PCP was virtual.   I can order strips for Alejandro [name] for 3x/day testing.      Bertram Gala Lary Eckardt, RN

## 2022-09-13 NOTE — Telephone Encounter (Addendum)
Yes, will take over management of diabetes as patient is unable to make office visits with Cecille Rubin at the diabetes clinic.

## 2022-09-15 NOTE — Telephone Encounter (Signed)
I sent to his PCP.   Bertram Gala Synai Prettyman, RN

## 2022-09-15 NOTE — Telephone Encounter (Signed)
Patient's name and date of birth verified at start of call.   Incoming refill request.  Caller has been notified that we require a minimum of 2 business days for refill processing. (This does not include weekends, holidays, etc.)      Alejandro [name]  July 19, 1941      Confirmed best contact number:   Home Phone [phone] (home)    Medications Requested:  Requested Prescriptions     Pending Prescriptions Disp Refills    BD Lawrence X 5 MM MISC         Preferred Pharmacy:   [place], [place] [phone] [name] Plump [phone]  [address]  [place] [address]  Phone: [phone] Fax: [phone]    Has patient already contacted pharmacy to confirm no refills were on file: No    Notes for office regarding medication request:          Other instructions and notes:  Last visit with provider: 06/10/2020  Next scheduled visit with provider: Visit date not found  *If next visit is not scheduled, book next needed visit or document if recall was added to list prior to sending for processing*  Inform patient that this needs to be on file before sending request as we do require for them to remain up-to-date with their recommended healthcare in order to avoid any interruptions in our ability to provide ongoing care such as refills.     Patient MyChart Status:  For Active Patients - Patient has been notified that they will receive an automated notification via MyChart once their script has been processed.   For Inactive Patients - Patient is aware that we have a patient portal, MyChart, which offers many benefits such as being able to request their refills electronically and receive automated notifications when scripts are processed.  In addition, they can schedule and manage appointments, view their testing results and visit notes, and stay connected with their care team.  Patient offered MyChart today.  Patient declined.  (Send link for set up if accepted)

## 2022-09-16 NOTE — Telephone Encounter (Signed)
Yes, I will manage patient's diabetes-thanks

## 2022-09-21 NOTE — Telephone Encounter (Signed)
Noted; please request staff check his blood pressure 2 times today and communicate results to our office. Thanks.

## 2022-09-21 NOTE — Telephone Encounter (Signed)
Call to service last night at 9:30 pm:    Caller reports pt had an unaccompanied fall; scratch on left knee and elbow. Pulse 65, bp 192/97. Caller reports no medical treatment necessary

## 2022-09-21 NOTE — Telephone Encounter (Signed)
Letter created, signed and faxed to [facility]. I know that they will need an order for this.

## 2022-09-22 ENCOUNTER — Telehealth

## 2022-09-22 MED ORDER — BD AUTOSHIELD DUO 30G X 5 MM MISC
Freq: Three times a day (TID) | 3 refills | Status: DC
Start: 2022-09-22 — End: 2023-04-12

## 2022-09-22 MED ORDER — PRODIGY NO CODING BLOOD GLUC VI STRP
11 refills | Status: AC
Start: 2022-09-22 — End: 2022-09-23

## 2022-09-22 NOTE — Telephone Encounter (Signed)
Rx's sent to pharmacy  Faxed to [facility] via [employer]

## 2022-09-22 NOTE — Telephone Encounter (Signed)
Tanzania from [facility] called [phone]) stating they need an order for the Autoshield plus needles and the prodigy unisticks be faxed to [facility] as well as to them please.

## 2022-09-22 NOTE — Telephone Encounter (Signed)
Fax number to [facility]    [phone]      Fax # for [facility]  [phone]

## 2022-09-23 MED ORDER — PRODIGY NO CODING BLOOD GLUC VI STRP
3 refills | Status: DC
Start: 2022-09-23 — End: 2023-04-12

## 2022-09-23 NOTE — Addendum Note (Signed)
Addended by: Philis Pique on: 09/23/2022 11:13 AM     Modules accepted: Orders

## 2022-09-23 NOTE — Telephone Encounter (Addendum)
Disp Refills Start End     blood glucose test strips (PRODIGY NO CODING BLOOD GLUC) strip 100 each 11 09/22/2022     Sig: Use to check blood sugars 3 times daily E11.65 on insulin    Sent to pharmacy as: Prodigy No Coding Blood Gluc In Vitro Strip (blood glucose test strips)    Cosign for Ordering: Accepted by Imagene Sheller, APRN - NP on 09/22/2022 11:43 AM    E-Prescribing Status: Receipt confirmed by pharmacy (09/22/2022 11:25 AM EST)        I did resend RX

## 2022-09-23 NOTE — Telephone Encounter (Signed)
Alejandro [name] called back stating that they did not receive the rx for the sticks and would like this to be resent to [facility] and to them. Please advise.

## 2022-09-24 NOTE — Telephone Encounter (Signed)
If patient has pain with any movements of right arm through full range of motion including over his head he needs to be seen- if he hit his head he needs to be seen at ED - please let them know same. Thanks

## 2022-09-24 NOTE — Telephone Encounter (Addendum)
Noted. Patient has pain in arm with very intense bruising. [name] requesting VV as pt has difficulty leaving facility and POA is having difficulty with transporting patient per [name]. Ok to schedule this as a VV? Can I put this in an acute visit appt slot as your next VV slot is December 13.     She explained pt does not believe he hit head. She explained she inspected his head and doesn't see any signs of injury to head     While on phone she was wondering if she could have further assistance with pt BS. She explained pt blood sugar used to run very high in 300-400s. She explained recently his BS have been 35 and 74. She explained that he currently receives 48u lantus in morning and 10u at night. Pt also recieves 7u novolog with each meal unless 75 or less, then hold. She wondering if this should stay as is or if they should change the holding value? When should this be held. Please advise

## 2022-09-24 NOTE — Telephone Encounter (Signed)
VV has been scheduled in your next soonest appt- acute slot    In regards to the novolog they need this sent to them as an order. Their fax is [phone]. I am having trouble loading this up as an order. Eddie Dibbles are you able to assist with this

## 2022-09-24 NOTE — Telephone Encounter (Signed)
Staff called to report that they observed pt on the floor, has a skin tears from previous fall that have reopened and large bruise on right shoulder.

## 2022-09-24 NOTE — Telephone Encounter (Signed)
Schedule patient for video visit next available.  Instruct staff to hold NovoLog 7 units if blood glucose is 130 or less.  Thanks

## 2022-09-28 NOTE — Telephone Encounter (Signed)
I have completed this order and had it signed and faxed

## 2022-10-07 ENCOUNTER — Telehealth
Admit: 2022-10-07 | Discharge: 2022-10-07 | Payer: BLUE CROSS/BLUE SHIELD | Attending: Registered Nurse | Primary: Registered Nurse

## 2022-10-07 DIAGNOSIS — R296 Repeated falls: Secondary | ICD-10-CM

## 2022-10-07 NOTE — Patient Instructions (Signed)
Will see patient for virtual audio video visit in 2 months for follow-up, multiple falls, right shoulder pain, dementia, insulin-dependent diabetes mellitus.

## 2022-10-07 NOTE — Assessment & Plan Note (Signed)
Assisted living staff check on patient is frequently as they are able  Increased fall risk due to dementia

## 2022-10-07 NOTE — Assessment & Plan Note (Signed)
Will work with assisted living site to facilitate patient obtaining a right shoulder x-ray with portable x-ray in patient's room if possible.

## 2022-10-07 NOTE — Progress Notes (Signed)
[place] [address]    VIRTUAL VISIT (performed via TeleHealth)   Encounter: audio-video total visit time 22 minutes  Provider location: office  Patient location: [place]  Participants: patient and nurse at [facility] assisted living site  The patient gave verbal consent for the Tele-health visit    ASSESSMENT AND PLAN      Will see patient for virtual audio video visit in 2 months for follow-up, multiple falls, right shoulder pain, dementia, insulin-dependent diabetes mellitus.    1. Multiple falls  Assessment & Plan:   Assisted living staff check on patient is frequently as they are able  Increased fall risk due to dementia  2. Acute pain of right shoulder  Assessment & Plan:   Will work with assisted living site to facilitate patient obtaining a right shoulder x-ray with portable x-ray in patient's room if possible.    81 year old gentleman challenged with dementia who is assisted by staff at assisted living site with today's visit.    [name] is a nurse at patient's assisted living site-she tells me that patient is still experiencing right shoulder pain following a previous fall-she will let me know if acetaminophen is ordered as a scheduled med - [name] will also let me know where order for right shoulder x-ray needs to be sent as they are having a portable x-ray company perform same.  [name] tells me they do not need any new orders for any meds at this time.  Appy tells me no significant changes in diabetes management or blood glucose levels.    Patient confirms verbally that he is still having right shoulder pain since fall.          CHIEF COMPLAINT, HPI & ROS   Alejandro [name] is a 81 y.o. male with a chief complaint of   Chief Complaint   Patient presents with    Fall     Patient recently fell at [place]. Following up from this.         Review of Systems    See HPI  EXAM   Telehealth-no physical exam performed        Current Outpatient Medications:      donepezil (ARICEPT) 10 MG tablet, , Disp: , Rfl:     blood glucose test strips (PRODIGY NO CODING BLOOD GLUC) strip, Use to check blood sugars 3 times daily E11.65 on insulin, Disp: 300 each, Rfl: 3    BD AUTOSHIELD DUO 30G X 5 MM MISC, 1 each by Does not apply route in the morning, at noon, and at bedtime, Disp: 300 each, Rfl: 3    Multiple Vitamins-Iron (TAB-A-VITE/IRON) TABS, , Disp: , Rfl:     HUMALOG KWIKPEN 100 UNIT/ML SOPN, INJECT 7-13 UNITS UNDER THE SKIN AT BREAKFAST, LUNCH, AND SUPPER, Disp: 45 mL, Rfl: 4    insulin glargine (LANTUS SOLOSTAR) 100 UNIT/ML injection, Inject 10 Units into the skin nightly, Disp: , Rfl:     sodium phosphate (FLEET) 7-19 GM/118ML, Place 1 enema rectally daily as needed, Disp: , Rfl:     glucagon 1 MG injection, Inject 1 mg into the muscle as needed, Disp: , Rfl:     Lancets MISC, Uni stick 28 gauge lancets to use to test glucose QID, Disp: 360 each, Rfl: 3    rosuvastatin (CRESTOR) 10 MG tablet, TAKE 1 TABLET BY MOUTH AT NIGHT, Disp: 90 tablet, Rfl: 1    tamsulosin (FLOMAX) 0.4 MG capsule, TAKE 1  CAPSULE BY MOUTH ONCE DAILY., Disp: 60 capsule, Rfl: 0    cetirizine (ZYRTEC) 10 MG tablet, Take 1 tablet by mouth daily, Disp: 90 tablet, Rfl: 1    acetaminophen (TYLENOL) 325 MG tablet, Take 2 tablets by mouth every 4 hours as needed for Pain or Fever, Disp: 120 tablet, Rfl: 0    amLODIPine (NORVASC) 10 MG tablet, TAKE 1 TABLET BY MOUTH EVERY DAY FOR HIGH BLOOD PRESSURE., Disp: , Rfl:     apixaban (ELIQUIS) 2.5 MG TABS tablet, TAKE 1 TABLET BY MOUTH TWICE DAILY., Disp: , Rfl:     benazepril (LOTENSIN) 10 MG tablet, Take 1 tablet by mouth daily ceived the following from [facility]: Outside name: benazepriL (LOTENSIN) 10 mg tablet, Disp: , Rfl:     bisacodyl (DULCOLAX) 10 MG suppository, Place 1 suppository rectally daily as needed, Disp: , Rfl:     ibuprofen (ADVIL;MOTRIN) 400 MG tablet, Take 1 tablet by mouth every 6 hours as needed, Disp: , Rfl:     insulin  glargine (LANTUS;BASAGLAR) 100 UNIT/ML injection pen, 48 Units daily, Disp: , Rfl:     levothyroxine (SYNTHROID) 50 MCG tablet, Take 1 tablet by mouth every morning (before breakfast), Disp: , Rfl:     magnesium citrate (CITROMA) SOLN, Take 296 mLs by mouth daily as needed, Disp: , Rfl:     pantoprazole (PROTONIX) 40 MG tablet, TAKE 1 TABLET BY MOUTH ONCE A DAY, Disp: , Rfl:     QUEtiapine (SEROQUEL) 50 MG tablet, TAKE 1 TABLET BY MOUTH TWICE DAILY., Disp: , Rfl:     donepezil (ARICEPT) 5 MG tablet, Take 1 tablet by mouth nightly (Patient not taking: Reported on 10/07/2022), Disp: , Rfl:     No orders of the defined types were placed in this encounter.     Previous visits, problems, meds and Allergies reviewed.      No follow-up provider specified.  Future Appointments   Date Time Provider Department Center   11/18/2022  2:30 PM Velicia Dejager, Hyman Hopes, APRN - NP [facility]          Mellody Drown, APRN - NP  10/07/2022

## 2022-10-12 NOTE — Telephone Encounter (Signed)
Aldona Bar, the med tech with [facility] called requesting for PCP to send an order to [facility] for Nystatin as pt's groin area looks raw and "yeasty"    CB# [phone]

## 2022-10-13 ENCOUNTER — Telehealth

## 2022-10-13 NOTE — Telephone Encounter (Addendum)
Does it cause itching?   Is it just red or is there nay blisters or draiage?    Does he wear a depends?  Does he have a barrier cream?    ? Fungal vs dermatitis

## 2022-10-13 NOTE — Telephone Encounter (Signed)
Tanzania from [facility] called. She was giving the pt a shower last night and she noticed that the pt has a rash on his left groin area. It looks red and its raw.   She is requesting a medication get faxed to the pharm verified on file and to [facility] at [employer] # [phone]

## 2022-10-14 MED ORDER — CLOTRIMAZOLE-BETAMETHASONE 1-0.05 % EX CREA
CUTANEOUS | 0 refills | Status: DC
Start: 2022-10-14 — End: 2022-11-16

## 2022-10-14 NOTE — Addendum Note (Signed)
Addended by: Duayne Cal on: 10/14/2022 10:35 AM     Modules accepted: Orders

## 2022-10-14 NOTE — Telephone Encounter (Signed)
Would recommend frequent changing of depends.    Will order a medicated cream through patient's pharmacy.    clotrimazole-betamethasone (LOTRISONE) 1-0.05 % cream Apply 1 g topically twice a day for the next 30 days to rash in groin.

## 2022-10-14 NOTE — Telephone Encounter (Signed)
Already addressed

## 2022-10-14 NOTE — Telephone Encounter (Signed)
Letter with order printed and signed by PCP. Faxed to [employer].

## 2022-10-14 NOTE — Telephone Encounter (Signed)
Spoke with Tanzania she said he is raw from wearing the depends. She feels it is from being incontinent all the time. Pt is very confused so she cannot tell if it is itchy for him. They have not been using any cream. They would like an order for one

## 2022-10-18 ENCOUNTER — Emergency Department: Admit: 2022-10-18 | Payer: BLUE CROSS/BLUE SHIELD | Primary: Registered Nurse

## 2022-10-18 ENCOUNTER — Inpatient Hospital Stay
Admit: 2022-10-18 | Discharge: 2022-10-18 | Disposition: A | Discharge status: Home / Self Care | Payer: BLUE CROSS/BLUE SHIELD | Attending: Emergency Medicine

## 2022-10-18 ENCOUNTER — Emergency Department: Admit: 2022-10-18 | Payer: MEDICARE | Primary: Registered Nurse

## 2022-10-18 DIAGNOSIS — G301 Alzheimer's disease with late onset: Principal | ICD-10-CM

## 2022-10-18 DIAGNOSIS — R296 Repeated falls: Secondary | ICD-10-CM

## 2022-10-18 LAB — POCT GLUCOSE: POC Glucose: 196 mg/dL

## 2022-10-18 NOTE — ED Provider Notes (Signed)
Chief Complaint   Patient presents with    Fall       Patient is a 81 year old male with known significant dementia whom struggles with chronic falls thought related to previous CVA that has resulted in a component of hemiparesis as apparently he was found on the floor, up against a door frame at the nursing home earlier and in that the fall was unwitnessed, despite the fact that the patient denied any injury or concern he is transferred to our emergency room for evaluation thought related to his chronic anticoagulation use.  He denies any and all concerns when I see him specifically no URI symptoms no head ache no chest pain he does not believe he fell although was found on the ground, with the exception of right shoulder region pain as there is an area of ecchymosis to the mid right upper arm that appears to be a few days old.    The history is provided by the patient and the nursing home.       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)    Hypertension    Atherosclerotic heart disease of native coronary artery without angina pectoris    BPH with obstruction/lower urinary tract symptoms    COVID-19    Type 1 diabetes mellitus with hyperglycemia, with long-term current use of insulin (HCC)    Mixed hyperlipidemia    Acute hypoxemic respiratory failure due to COVID-19 [facility](HCC)    Muscle weakness    Lesion of right external ear    Presence of cardiac pacemaker    Dementia associated with other underlying disease with behavioral  disturbance (HCC)    At high risk for impaired skin integrity    Hypothyroidism    Cerebral infarction (HCC)    Chronic atrial fibrillation (HCC)    Presence of insulin pump    Acute pain of right shoulder       Past Medical History:  Past Medical History:   Diagnosis Date    Dementia (HCC)     Diabetes (HCC)     Grief     loss of wife 2014    Hypertension     Presence of cardiac pacemaker        No past surgical history on file.    Social History     Socioeconomic History    Marital status: Widowed     Spouse name: Not on file    Number of children: Not on file    Years of education: Not on file    Highest education level: Not on file   Occupational History    Not on file   Tobacco Use    Smoking status: Never    Smokeless tobacco: Never   Substance and Sexual Activity    Alcohol use: Never    Drug use: Never    Sexual activity: Not on file   Other Topics Concern    Not on  file   Social History Narrative    Widowed without children. Wife died about 23-Feb-2013  Previous employment as a Runner, broadcasting/film/video and principal  4 years of college       Social Determinants of Health     Financial Resource Strain: Unknown (08/17/2022)    Overall Financial Resource Strain (CARDIA)     Difficulty of Paying Living Expenses: Patient refused   Food Insecurity: Not on file (08/17/2022)   Transportation Needs: Unknown (08/17/2022)    PRAPARE - Therapist, art (Medical): Not on file     Lack of Transportation (Non-Medical): Patient refused   Physical Activity: Not on file   Stress: Not on file   Social Connections: Not on file   Intimate Partner Violence: Not on file   Housing Stability: Unknown (08/17/2022)    Housing Stability Vital Sign     Unable to Pay for Housing in the Last Year: Not on file     Number of Places Lived in the Last Year: Not on file     Unstable Housing in the Last Year: Patient refused       Family History:  No family history on file.    Medica tions:  Patient medication list reviewed  Previous  Medications    ACETAMINOPHEN (TYLENOL) 325 MG TABLET    Take 2 tablets by mouth every 4 hours as needed for Pain or Fever    AMLODIPINE (NORVASC) 10 MG TABLET    TAKE 1 TABLET BY MOUTH EVERY DAY FOR HIGH BLOOD PRESSURE.    APIXABAN (ELIQUIS) 2.5 MG TABS TABLET    TAKE 1 TABLET BY MOUTH TWICE DAILY.    BD AUTOSHIELD DUO 30G X 5 MM MISC    1 each by Does not apply route in the morning, at noon, and at bedtime    BENAZEPRIL (LOTENSIN) 10 MG TABLET    Take 1 tablet by mouth daily ceived the following from [facility]: Outside name: benazepriL (LOTENSIN) 10 mg tablet    BISACODYL (DULCOLAX) 10 MG SUPPOSITORY    Place 1 suppository rectally daily as needed    BLOOD GLUCOSE TEST STRIPS (PRODIGY NO CODING BLOOD GLUC) STRIP    Use to check blood sugars 3 times daily E11.65 on insulin    CETIRIZINE (ZYRTEC) 10 MG TABLET    Take 1 tablet by mouth daily    CLOTRIMAZOLE-BETAMETHASONE (LOTRISONE) 1-0.05 % CREAM    Apply 1 g topically 2 times daily for 30 days.    DONEPEZIL (ARICEPT) 10 MG TABLET        GLUCAGON 1 MG INJECTION    Inject 1 mg into the muscle as needed    HUMALOG KWIKPEN 100 UNIT/ML SOPN    INJECT 7-13 UNITS UNDER THE SKIN AT BREAKFAST, LUNCH, AND SUPPER    IBUPROFEN (ADVIL;MOTRIN) 400 MG TABLET    Take 1 tablet by mouth every 6 hours as needed    INSULIN GLARGINE (LANTUS SOLOSTAR) 100 UNIT/ML INJECTION    Inject 10 Units into the skin nightly    INSULIN GLARGINE (LANTUS;BASAGLAR) 100 UNIT/ML INJECTION PEN    48 Units daily    LANCETS MISC    Uni stick 28 gauge lancets to use to test glucose QID    LEVOTHYROXINE (SYNTHROID) 50 MCG TABLET    Take 1 tablet by mouth every morning (before breakfast)    MAGNESIUM CITRATE (CITROMA) SOLN    Take 296 mLs by mouth daily as needed  MULTIPLE VITAMINS-IRON (TAB-A-VITE/IRON) TABS        PANTOPRAZOLE (PROTONIX) 40 MG TABLET    TAKE 1 TABLET BY MOUTH ONCE A DAY    QUETIAPINE (SEROQUEL) 50 MG TABLET    TAKE 1 TABLET BY MOUTH TWICE DAILY.    ROSUVASTATIN (CRESTOR)  10 MG TABLET    TAKE 1 TABLET BY MOUTH AT NIGHT    SODIUM PHOSPHATE (FLEET) 7-19 GM/118ML    Place 1 enema rectally daily as needed    TAMSULOSIN (FLOMAX) 0.4 MG CAPSULE    TAKE 1 CAPSULE BY MOUTH ONCE DAILY.       Anticoagulants / Antiplatelet medications:  apixaban Tabs - 2.5 MG    Allergies:    Shellfish allergy    Review of Systems  See history of present illness for relevant review of systems.    ED Triage Vitals   BP Temp Temp src Pulse Resp SpO2 Height Weight   -- -- -- -- -- -- -- --     Physical Exam  Vitals and nursing note reviewed.   Constitutional:       General: He is not in acute distress.     Appearance: Normal appearance. He is normal weight. He is not ill-appearing, toxic-appearing or diaphoretic.      Comments: Patient talkative, cooperative repeatedly denies any concerns before ultimately injecting that his right shoulder is bothering him.   HENT:      Head: Atraumatic.      Right Ear: Tympanic membrane and ear canal normal. No hemotympanum.      Left Ear: Tympanic membrane and ear canal normal. No hemotympanum.      Mouth/Throat:      Pharynx: No pharyngeal swelling, oropharyngeal exudate or posterior oropharyngeal erythema.      Comments: Patient can open his mouth widely here without hesitancy  Cardiovascular:      Rate and Rhythm: Normal rate and regular rhythm.      Pulses:           Radial pulses are 2+ on the right side and 2+ on the left side.        Dorsalis pedis pulses are 2+ on the right side and 2+ on the left side.   Pulmonary:      Effort: Pulmonary effort is normal. No respiratory distress.      Breath sounds: Normal breath sounds.      Comments: Symmetric breath sounds to all lung fields no extra noise heard  Chest:      Chest wall: No deformity or tenderness.   Abdominal:      General: Abdomen is flat.      Palpations: Abdomen is soft.      Tenderness: There is no abdominal tenderness.   Musculoskeletal:         General: No tenderness.      Cervical back: Neck supple. No spinous  process tenderness or muscular tenderness.      Right lower leg: No edema.      Left lower leg: No edema.      Comments: Patient does report some mild discomfort to palpation of the anterior shoulder and yet this appears to be in good position distal to the midportion of the right upper arm there is some ecchymosis and yet this is not indurated there is no associated erythema there is some yellowing of the edges as this bruising appears a few days old he has good range of motion the elbow and wrist distal the left upper  extremity bilateral lower extremity show no deformity along the length with good range of motion at the hips knees and ankles bilaterally   Lymphadenopathy:      Cervical: No cervical adenopathy.   Skin:     General: Skin is warm and dry.   Neurological:      General: No focal deficit present.      Mental Status: He is alert.      Cranial Nerves: Cranial nerves 2-12 are intact.      Sensory: Sensation is intact.      Motor: Motor function is intact.   Psychiatric:         Attention and Perception: Attention and perception normal.         Mood and Affect: Mood and affect normal.         Speech: Speech normal.         Behavior: Behavior normal. Behavior is cooperative.         Thought Content: Thought content normal.         Cognition and Memory: Cognition and memory normal.         Judgment: Judgment normal.         Medical Decision Making  81 year old male described as having severe dementia with frequent falls thought related to previous CVA arrives here with minimal complaints no obvious injury as based on history alone we will obtain CT of head to rule out intracranial abnormality I will x-ray his right humerus although no obvious deformity is palpable the ecchymosis to the mid humerus is not indurated is already yellowing thought to be a few days old in nature alternatively no other obvious injury to the extremities found we will obtain a random glucose here if this is in a reasonable range no  more aggressive blood work or evaluation for his frequent falls is thought indicated in this emergency room.    Amount and/or Complexity of Data Reviewed  Labs: ordered.  Radiology: ordered.                  ED Course:  ED Course as of 10/18/22 0725   Mon Oct 18, 2022   3220 CT is interpreted as no acute intracranial abnormality as the x-ray of the humerus shows no obvious fracture there is degeneration of the lateral clavicle and yet this is similar to previous studies this is previously been diagnosed as a deterioration of the acromioclavicular joint the patient is thought stable for discharge [CA]      ED Course User Index  [CA] Pandora Leiter, MD       Procedures    Vital Signs for this visit:  Vitals:    10/18/22 0527   BP: (!) 149/73   Pulse: 65   Resp: 18   Temp: 98 F (36.7 C)   SpO2: 98%   Weight: 86.2 kg (190 lb)   Height: 1.803 m ( )       Lab orders and findings within the past 24 hours that I have personally reviewed and interpreted during this visit.   Recent Results (from the past 24 hour(s))   POCT Glucose    Collection Time: 10/18/22  5:40 AM   Result Value Ref Range    POC Glucose 196 mg/dL    Performed by: Connye Burkitt        Recent radiology studies including this visit.  I have personally reviewed these studies and my personal interpretation, if available, is documented in the ED  Course:  CT HEAD WO CONTRAST   Final Result   1. No acute findings.   2. Moderate cerebral atrophy and white matter changes most consistent with   chronic microangiopathic or age-related changes.                     THIS IS AN ELECTRONICALLY VERIFIED REPORT   Dictated By:  Debby Bud   Dictated:  10/18/2022 06:12:55   Transcribed By:  Self-Edited - PowerScribe   Transcribed:  10/18/2022 06:12:55   Signed By:  Debby Bud   Signed:  10/18/2022 06:15:26      Providers for questions regarding this report,   Monday - Friday 8am-5pm call [facility] Support at [phone].   From 5pm-7am  everyday call [facility] Reading Room at [phone].   WSN: [id number]      XR HUMERUS RIGHT (MIN 2 VIEWS)    (Results Pending)       Medications given in the ED:  Medications - No data to display    Diagnosis:  1. Frequent falls    2. Severe dementia, unspecified dementia type, unspecified whether behavioral, psychotic, or mood disturbance or anxiety (HCC)    3. Chronic right shoulder pain            Condition at disposition:  stable    DISPOSITION Decision To Discharge 10/18/2022 06:32:57 AM      Discharge prescriptions and/or changes if applicable:       Medication List        CONTINUE taking these medications      BD AutoShield Duo 30G X 5 MM Misc  Generic drug: Insulin Pen Needle  1 each by Does not apply route in the morning, at noon, and at bedtime     Lancets Misc  Uni stick 28 gauge lancets to use to test glucose QID            ASK your doctor about these medications      acetaminophen 325 MG tablet  Commonly known as: TYLENOL  Take 2 tablets by mouth every 4 hours as needed for Pain or Fever     amLODIPine 10 MG tablet  Commonly known as: NORVASC     apixaban 2.5 MG Tabs tablet  Commonly known as: ELIQUIS     benazepril 10 MG tablet  Commonly known as: LOTENSIN     bisacodyl 10 MG suppository  Commonly known as: DULCOLAX     cetirizine 10 MG tablet  Commonly known as: ZYRTEC  Take 1 tablet by mouth daily     clotrimazole-betamethasone 1-0.05 % cream  Commonly known as: LOTRISONE  Apply 1 g topically 2 times daily for 30 days.     donepezil 10 MG tablet  Commonly known as: ARICEPT     glucagon 1 MG injection     HumaLOG KwikPen 100 UNIT/ML Sopn  Generic drug: insulin lispro (1 Unit Dial)  INJECT 7-13 UNITS UNDER THE SKIN AT BREAKFAST, LUNCH, AND SUPPER     ibuprofen 400 MG tablet  Commonly known as: ADVIL;MOTRIN     * insulin glargine 100 UNIT/ML injection  Commonly known as: LANTUS SOLOSTAR     * insulin glargine 100 UNIT/ML injection pen  Commonly known as: LANTUS;BASAGLAR     levothyroxine 50  MCG tablet  Commonly known as: SYNTHROID     magnesium citrate Soln  Commonly known as: CITROMA     pantoprazole 40 MG tablet  Commonly known as: PROTONIX  Prodigy No Coding Blood Gluc strip  Generic drug: blood glucose test strips  Use to check blood sugars 3 times daily E11.65 on insulin     QUEtiapine 50 MG tablet  Commonly known as: SEROQUEL     rosuvastatin 10 MG tablet  Commonly known as: CRESTOR  TAKE 1 TABLET BY MOUTH AT NIGHT     sodium phosphate 7-19 GM/118ML     Tab-A-Vite/Iron Tabs     tamsulosin 0.4 MG capsule  Commonly known as: FLOMAX  TAKE 1 CAPSULE BY MOUTH ONCE DAILY.           * This list has 2 medication(s) that are the same as other medications prescribed for you. Read the directions carefully, and ask your doctor or other care provider to review them with you.                  Follow-up if applicable:  Cramm, Hyman Hopes, APRN - NP  [address] [place] [address]  [phone]      As needed    [facility] EMERGENCY DEPARTMENT  [address] [place] [address]  [phone]    If symptoms worsen      Please note that portions of this document were created using the M*Modal Fluency Direct dictation system.  Any inconsistencies or typographical errors may be the result of mis-transcription that persist in spite of proof-reading and should be addressed with the document creator.        Pandora Leiter, MD  10/18/22 [phone]

## 2022-10-18 NOTE — ED Notes (Signed)
Pt oriented to self. States he is unaware as to why he is here at ED. Pt reports his RUE is aching but is inconsistent with reporting pain. Pt very unsteady upon ambulation from EMS stretcher to hospital stretcher. Pt has no neurological deficits upon examination.      Youlanda Roys, RN  10/18/22 [phone]

## 2022-10-18 NOTE — ED Notes (Signed)
Spoke with [name] at [facility] regarding pt being up for discharge. [name] states she will need to call her boss regarding transportation for pt and will call back.     Youlanda Roys, RN  10/18/22 [phone]

## 2022-10-18 NOTE — Telephone Encounter (Signed)
Patient seen in ER for ? Of a fall. Please review and advise if patient will need a follow up. If so, in person or virtual?

## 2022-10-18 NOTE — ED Triage Notes (Signed)
Patient presented to the ED BIB EMS s/p fall (?). EMS reports that it was 'unclear' if patient fell; but say that he was laying up against the fall, 'as if he fell.' Patient denies any pains, denies falling, Denies chest pain or SOB. Patient denies LOC or any other trauma.     EMS reported that pt is poor historian due to medical Hx (dementia) and is a resident of the memory care unit. Patient is oriented to person only which is baseline per EMS. No new exterior signs of trauma noted on patient with an OLD hematoma noted to RUE in later stages of healing. +Plavix.    No unilateral weakness noted. No slurred speech or facial droop.

## 2022-11-09 NOTE — Progress Notes (Signed)
Last AWV= None found    PREVENTIVE CARE - SCREENINGS  Colon cancer:  Not indicated Patient age 82   Lung cancer: Not indicated Never smoker   Smoking Hx  reports that he has never smoked. He has never used smokeless tobacco.   Hepatitis C: Not indicated Patient age 82   Lipids:  Up to date   Lab Results   Component Value Date    CHOL 150 05/06/2022    CHOL 107 (L) 08/08/2020     Lab Results   Component Value Date    TRIG 92 05/06/2022    TRIG 53 08/08/2020     Lab Results   Component Value Date    HDL 47 05/06/2022    HDL 32 (L) 08/08/2020     Lab Results   Component Value Date    LDLCHOLESTEROL 84.6 05/06/2022    LDLCALC 64.4 (L) 08/08/2020     No results found for: "LABVLDL", "VLDL"  No results found for: "CHOLHDLRATIO"   AAA:  Not indicated Never smoker, no known family history       Diabetes: Due A1C, Microalbumin, Eye exam, Foot exam  Patient dx DMT1  Lab Results   Component Value Date    NA 137 06/19/2022    K 3.7 06/19/2022    CL 109 06/19/2022    CO2 24 06/19/2022    BUN 22 (H) 06/19/2022    CREATININE 0.96 06/19/2022    GLUCOSE 156 (H) 06/19/2022    CALCIUM 8.8 06/19/2022    PROT 7.1 06/19/2022    LABALBU 3.6 06/19/2022    BILITOT 0.60 06/19/2022    ALKPHOS 79 06/19/2022    AST 23 06/19/2022    ALT 23 06/19/2022    LABGLOM >60 06/19/2022    GFRAA >60 02/17/2021    AGRATIO 1.0 02/17/2021    GLOB 3.5 06/19/2022      A1C Hemoglobin A1C   Date Value Ref Range Status   07/22/2020 8.6 (H) 4.8 - 5.6 % Final     Comment:     A1c > 6.5% can be used to diagnose diabetes mellitus according to ADA Clinical  Practice Recommendations.  Diagnosis should be confirmed by repeating the A1c  test, except in those individuals who are symptomatic and also have an increased  plasma glucose >200 mg/dL.    A1c 5.7-6.4% are considered at an increased risk for developing diabetes  mellitus.    Conditions that shorten red cell survival, such as hemolytic anemia, may yield  falsely low results.  Iron deficiency anemia may yield  falsely high results.    ------------------------------------------------------------------------------------------------    A1c <7% has been shown to reduce microvascular complications of diabetes and is  considered a reasonable A1c goal for many nonpregnant diabetic adults.    A1c <6.5% may be suggested for selected individual diabetic patients, if this  can be achieved without significant hypoglycemia or other adverse effects of  treatment.  Appropriate patients might include those with short duration of  diabetes, long life expectancy, and no significant CVD.    A1c <8% may be appropriate for diabetic patients with a history of severe  hypoglycemia, limited life expectancy, advanced microvascular or macrovascular  complications, and extensive comorbid conditions.       Hemoglobin A1C, POC   Date Value Ref Range Status   12/01/2021 7.0 % Final      DM eye exam None found   DM foot exam There are no preventive care reminders to display for this patient.     Microalb No results found for: "MALB24HUR"     Prostate Cancer:  Not indicated Patient age 82   PSA No results found for: "PSA", "PSASERMON", "XBPSA", "PSAFREETOTAL", "PSAFRTOT", "4103", "PSAD", "ULTRAPSA", "PSAEXT", "PSAPOC", "PSADIA"     Influenza: Due None found   Pneumonia: Due None found   Shingrix: Due None found   Td/Tdap: Due None found   COVID 19: Due 2 doses given, boosters found in immpact, chart updated     Active orders for HM gaps: None  HIN reviewed: Yes

## 2022-11-15 ENCOUNTER — Encounter

## 2022-11-15 NOTE — Telephone Encounter (Signed)
Refill request from [facility]

## 2022-11-16 MED ORDER — CLOTRIMAZOLE-BETAMETHASONE 1-0.05 % EX CREA
CUTANEOUS | 0 refills | Status: AC
Start: 2022-11-16 — End: 2022-12-15

## 2022-11-16 NOTE — Telephone Encounter (Signed)
"  Medication: Betamethasone  Last Office Visit: 1 year  Lab Monitoring: None  Category: Topical Steroid Low-Medium Potency  Length of refill: 6 months  Brand Name: No  Comments:"  Medications Requested:  Requested Prescriptions     Pending Prescriptions Disp Refills    clotrimazole-betamethasone (LOTRISONE) 1-0.05 % cream 60 g 0     Sig: Apply 1 g topically 2 times daily for 30 days.       Preferred Pharmacy:   [place], [place] [phone]  [place] [address]  Phone: [phone] Fax: [phone]      Date of Last Refill: 10/14/2022    Prescription Refill Protocol reviewed:YES    Allergy List Reviewed and Verified: YES    Possible medication to medication interactions reviewed: YES    Last appt @ PCP Office: 10/07/2022     Future Appointments   Date Time Provider [place]   11/25/2022  2:30 PM Cramm, Nickola Major, APRN - NP [facility]   12/09/2022  9:30 AM Cramm, Nickola Major, APRN - NP [facility]       MOST RECENT BLOOD PRESSURES  BP Readings from Last 3 Encounters:   10/18/22 (!) 149/73   06/19/22 111/60   04/23/22 (!) 124/58         MOST RECENT LAB DATA  Lab Results   Component Value Date/Time    K 3.7 06/19/2022 03:53 PM    ALT 23 06/19/2022 03:53 PM    TSH 2.575 03/30/2022 02:00 PM    CHOL 150 05/06/2022 07:30 AM    CHOL 107 08/08/2020 11:00 AM    HGB 13.0 06/19/2022 03:53 PM    HCT 40.0 06/19/2022 03:53 PM    HBA1CPOC 7.0 12/01/2021 03:30 PM

## 2022-11-18 ENCOUNTER — Encounter: Payer: MEDICARE | Attending: Registered Nurse | Primary: Registered Nurse

## 2022-11-23 NOTE — Telephone Encounter (Signed)
noted 

## 2022-11-23 NOTE — Telephone Encounter (Signed)
Spoke with [facility] no injuries. Pt said he is not in any  pain.

## 2022-11-23 NOTE — Telephone Encounter (Signed)
Myriam Jacobson from [employer] left a message with the service last night that he fell out of bed but apparent injuries

## 2022-11-23 NOTE — Telephone Encounter (Signed)
Just asking to clarify - he was or was not injured? Thanks

## 2022-11-25 ENCOUNTER — Ambulatory Visit: Payer: BLUE CROSS/BLUE SHIELD | Attending: Registered Nurse | Primary: Registered Nurse

## 2022-11-25 NOTE — Progress Notes (Deleted)
Last AWV= None found    PREVENTIVE CARE - SCREENINGS  Colon cancer:  Not indicated Patient age 82   Lung cancer: Not indicated Never smoker   Smoking Hx  reports that he has never smoked. He has never used smokeless tobacco.   Hepatitis C: Not indicated Patient age 60   Lipids:  Up to date   Lab Results   Component Value Date    CHOL 150 05/06/2022    CHOL 107 (L) 08/08/2020     Lab Results   Component Value Date    TRIG 92 05/06/2022    TRIG 53 08/08/2020     Lab Results   Component Value Date    HDL 47 05/06/2022    HDL 32 (L) 08/08/2020     Lab Results   Component Value Date    LDLCHOLESTEROL 84.6 05/06/2022    LDLCALC 64.4 (L) 08/08/2020     No results found for: "LABVLDL", "VLDL"  No results found for: "CHOLHDLRATIO"   AAA:  Not indicated Never smoker, no known family history       Diabetes: Due A1C, Microalbumin, Eye exam, Foot exam  Patient dx DMT1  Lab Results   Component Value Date    NA 137 06/19/2022    K 3.7 06/19/2022    CL 109 06/19/2022    CO2 24 06/19/2022    BUN 22 (H) 06/19/2022    CREATININE 0.96 06/19/2022    GLUCOSE 156 (H) 06/19/2022    CALCIUM 8.8 06/19/2022    PROT 7.1 06/19/2022    LABALBU 3.6 06/19/2022    BILITOT 0.60 06/19/2022    ALKPHOS 79 06/19/2022    AST 23 06/19/2022    ALT 23 06/19/2022    LABGLOM >60 06/19/2022    GFRAA >60 02/17/2021    AGRATIO 1.0 02/17/2021    GLOB 3.5 06/19/2022      A1C Hemoglobin A1C   Date Value Ref Range Status   07/22/2020 8.6 (H) 4.8 - 5.6 % Final     Comment:     A1c > 6.5% can be used to diagnose diabetes mellitus according to ADA Clinical  Practice Recommendations.  Diagnosis should be confirmed by repeating the A1c  test, except in those individuals who are symptomatic and also have an increased  plasma glucose >200 mg/dL.    A1c 5.7-6.4% are considered at an increased risk for developing diabetes  mellitus.    Conditions that shorten red cell survival, such as hemolytic anemia, may yield  falsely low results.  Iron deficiency anemia may yield  falsely high results.    ------------------------------------------------------------------------------------------------    A1c <7% has been shown to reduce microvascular complications of diabetes and is  considered a reasonable A1c goal for many nonpregnant diabetic adults.    A1c <6.5% may be suggested for selected individual diabetic patients, if this  can be achieved without significant hypoglycemia or other adverse effects of  treatment.  Appropriate patients might include those with short duration of  diabetes, long life expectancy, and no significant CVD.    A1c <8% may be appropriate for diabetic patients with a history of severe  hypoglycemia, limited life expectancy, advanced microvascular or macrovascular  complications, and extensive comorbid conditions.       Hemoglobin A1C, POC   Date Value Ref Range Status   12/01/2021 7.0 % Final      DM eye exam None found   DM foot exam There are no preventive care reminders to display for this patient.  Microalb No results found for: "MALB24HUR"     Prostate Cancer:  Not indicated Patient age 20   PSA No results found for: "PSA", "PSASERMON", "XBPSA", "PSAFREETOTAL", "PSAFRTOT", "4103", "PSAD", "ULTRAPSA", "PSAEXT", "PSAPOC", "PSADIA"     Influenza: Due None found   Pneumonia: Due None found   Shingrix: Due None found   Td/Tdap: Due None found   COVID 19: Due 2 doses given, boosters found in immpact, chart updated     Active orders for HM gaps: None  HIN reviewed: Yes

## 2022-12-09 ENCOUNTER — Encounter: Payer: BLUE CROSS/BLUE SHIELD | Attending: Registered Nurse | Primary: Registered Nurse

## 2022-12-17 ENCOUNTER — Ambulatory Visit: Payer: MEDICARE | Attending: Registered Nurse | Primary: Registered Nurse

## 2022-12-17 NOTE — Telephone Encounter (Signed)
[place] with [name] was calling to let the pt's pcp know he was unable to make it to his appt as his Dementia  and it is progressing,  Pt's family came and tried to bring him to the appt and he didn't want to leave.    Pt has been scheduled for a virtual on 02/01/23 at [place] can be reached at [phone]

## 2022-12-17 NOTE — Progress Notes (Unsigned)
Last AWV= None found     PREVENTIVE CARE - SCREENINGS  Colon cancer:  Not indicated Patient age 82   Lung cancer: Not indicated Never smoker   Smoking Hx  reports that he has never smoked. He has never used smokeless tobacco.   Hepatitis C: Not indicated Patient age 62   Lipids:  Up to date         Lab Results   Component Value Date     CHOL 150 05/06/2022     CHOL 107 (L) 08/08/2020            Lab Results   Component Value Date     TRIG 92 05/06/2022     TRIG 53 08/08/2020            Lab Results   Component Value Date     HDL 47 05/06/2022     HDL 32 (L) 08/08/2020            Lab Results   Component Value Date     LDLCHOLESTEROL 84.6 05/06/2022     LDLCALC 64.4 (L) 08/08/2020      No results found for: "LABVLDL", "VLDL"  No results found for: "CHOLHDLRATIO"   AAA:  Not indicated Never smoker, no known family history         Diabetes: Due A1C, Microalbumin, Eye exam, Foot exam  Patient dx DMT1        Lab Results   Component Value Date     NA 137 06/19/2022     K 3.7 06/19/2022     CL 109 06/19/2022     CO2 24 06/19/2022     BUN 22 (H) 06/19/2022     CREATININE 0.96 06/19/2022     GLUCOSE 156 (H) 06/19/2022     CALCIUM 8.8 06/19/2022     PROT 7.1 06/19/2022     LABALBU 3.6 06/19/2022     BILITOT 0.60 06/19/2022     ALKPHOS 79 06/19/2022     AST 23 06/19/2022     ALT 23 06/19/2022     LABGLOM >60 06/19/2022     GFRAA >60 02/17/2021     AGRATIO 1.0 02/17/2021     GLOB 3.5 06/19/2022      A1C         Hemoglobin A1C   Date Value Ref Range Status   07/22/2020 8.6 (H) 4.8 - 5.6 % Final       Comment:       A1c > 6.5% can be used to diagnose diabetes mellitus according to ADA Clinical  Practice Recommendations.  Diagnosis should be confirmed by repeating the A1c  test, except in those individuals who are symptomatic and also have an increased  plasma glucose >200 mg/dL.     A1c 5.7-6.4% are considered at an increased risk for developing diabetes  mellitus.     Conditions that shorten red cell survival, such as hemolytic  anemia, may yield  falsely low results.  Iron deficiency anemia may yield falsely high results.     ------------------------------------------------------------------------------------------------     A1c <7% has been shown to reduce microvascular complications of diabetes and is  considered a reasonable A1c goal for many nonpregnant diabetic adults.     A1c <6.5% may be suggested for selected individual diabetic patients, if this  can be achieved without significant hypoglycemia or other adverse effects of  treatment.  Appropriate patients might include those with short duration of  diabetes, long life expectancy, and no significant  CVD.     A1c <8% may be appropriate for diabetic patients with a history of severe  hypoglycemia, limited life expectancy, advanced microvascular or macrovascular  complications, and extensive comorbid conditions.               Hemoglobin A1C, POC   Date Value Ref Range Status   12/01/2021 7.0 % Final      DM eye exam None found   DM foot exam There are no preventive care reminders to display for this patient.   Microalb No results found for: "MALB24HUR"      Prostate Cancer:  Not indicated Patient age 50   PSA No results found for: "PSA", "PSASERMON", "XBPSA", "PSAFREETOTAL", "PSAFRTOT", "4103", "PSAD", "ULTRAPSA", "PSAEXT", "PSAPOC", "PSADIA"      Influenza: Due None found   Pneumonia: Due None found   Shingrix: Due None found   Td/Tdap: Due None found   COVID 19: Due 2 doses given, boosters found in immpact, chart updated      Active orders for HM gaps: None  HIN reviewed: Yes

## 2022-12-17 NOTE — Telephone Encounter (Signed)
Noted  

## 2022-12-20 NOTE — Telephone Encounter (Signed)
Would you like no show letter #1 sent for missed appointment on 12/17/22?

## 2022-12-20 NOTE — Telephone Encounter (Signed)
Patient missed his appointment due to dementia and being unable to drive himself to his appointment. Patient has been rescheduled for a virtual visit on 3-26 at 3:00PM.

## 2022-12-20 NOTE — Telephone Encounter (Signed)
No that is okay-patient has dementia - was having a bad day on recent day of scheduled appointment.  Thanks

## 2022-12-21 NOTE — Telephone Encounter (Signed)
No letter sent.

## 2022-12-31 DIAGNOSIS — R7989 Other specified abnormal findings of blood chemistry: Secondary | ICD-10-CM

## 2022-12-31 DIAGNOSIS — I214 Non-ST elevation (NSTEMI) myocardial infarction: Secondary | ICD-10-CM

## 2022-12-31 NOTE — ED Provider Notes (Signed)
Chief Complaint   Patient presents with    Emesis       HPI  The patient is an 82 year old male with a history of type 1 diabetes, CVA with left hemiplegia, Alzheimer's, hyperlipidemia, hypertension, hypothyroidism, and chronic atrial fibrillation (on Eliquis) who is presenting today from the memory care unit at [facility] with nausea and vomiting all day today.  Reportedly, the patient had a episode of projectile emesis that hit staff at [facility], prompting them to call EMS.       Patient himself denies any pain anywhere, fevers, chills, nausea, vomiting, diarrhea, chest pain, shortness of breath, lightheadedness, dysuria, or hematuria.  He is unaware where he is and why he is here.  I asked the nurse if the patient has vomited since he has been here and he has not.      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[place])    Hypertension    Atherosclerotic heart disease of native coronary artery without angina pectoris    BPH with obstruction/lower urinary tract symptoms    COVID-19    Type 1 diabetes mellitus with hyperglycemia, with long-term current use of insulin (HCC)    Mixed hyperlipidemia    Acute hypoxemic respiratory failure due to COVID-19 [facility])    Muscle weakness    Lesion of right external ear    Presence of cardiac pacemaker    Dementia associated with other underlying disease with behavioral disturbance ([place])    At high risk for impaired skin integrity    Hypothyroidism    Cerebral infarction ([place])     Chronic atrial fibrillation (HCC)    Presence of insulin pump    Acute pain of right shoulder       Past Medical History:  Past Medical History:   Diagnosis Date    Dementia ([place])     Diabetes ([place])     Grief     loss of wife 2013/02/11    Hypertension     Presence of cardiac pacemaker        No past surgical history on file.    Social History     Socioeconomic History    Marital status: Widowed     Spouse name: Not on file    Number of children: Not on file    Years of education: Not on file    Highest education level: Not on file   Occupational History    Not on file   Tobacco Use    Smoking status: Never    Smokeless tobacco: Never   Substance and Sexual Activity    Alcohol use: Never    Drug use: Never    Sexual activity: Not on file   Other Topics Concern    Not on file   Social History Narrative    Widowed without children. Wife died about 11-Feb-2013  Previous employment as a Pharmacist, hospital and principal  4 years of college  Social Determinants of Health     Financial Resource Strain: Patient Declined (08/17/2022)    Overall Financial Resource Strain (CARDIA)     Difficulty of Paying Living Expenses: Patient declined   Food Insecurity: Not on file (08/17/2022)   Transportation Needs: Unknown (08/17/2022)    PRAPARE - Armed forces logistics/support/administrative officer (Medical): Not on file     Lack of Transportation (Non-Medical): Patient declined   Physical Activity: Not on file   Stress: Not on file   Social Connections: Not on file   Intimate Partner Violence: Not on file   Housing Stability: Unknown (08/17/2022)    Housing Stability Vital Sign     Unable to Pay for Housing in the Last Year: Not on file     Number of Places Lived in the Last Year: Not on file     Unstable Housing in the Last Year: Patient refused       Family History:  No family history on file.    Medica tions:  Patient medication list reviewed  Previous Medications    ACETAMINOPHEN (TYLENOL) 325 MG TABLET    Take 2 tablets by mouth every 4 hours as needed for  Pain or Fever    AMLODIPINE (NORVASC) 10 MG TABLET    TAKE 1 TABLET BY MOUTH EVERY DAY FOR HIGH BLOOD PRESSURE.    APIXABAN (ELIQUIS) 2.5 MG TABS TABLET    TAKE 1 TABLET BY MOUTH TWICE DAILY.    BD AUTOSHIELD DUO 30G X 5 MM MISC    1 each by Does not apply route in the morning, at noon, and at bedtime    BENAZEPRIL (LOTENSIN) 10 MG TABLET    Take 1 tablet by mouth daily ceived the following from [place] [facility]: Outside name: benazepriL (LOTENSIN) 10 mg tablet    BISACODYL (DULCOLAX) 10 MG SUPPOSITORY    Place 1 suppository rectally daily as needed    BLOOD GLUCOSE TEST STRIPS (PRODIGY NO CODING BLOOD GLUC) STRIP    Use to check blood sugars 3 times daily E11.65 on insulin    CETIRIZINE (ZYRTEC) 10 MG TABLET    Take 1 tablet by mouth daily    DONEPEZIL (ARICEPT) 10 MG TABLET        GLUCAGON 1 MG INJECTION    Inject 1 mg into the muscle as needed    HUMALOG KWIKPEN 100 UNIT/ML SOPN    INJECT 7-13 UNITS UNDER THE SKIN AT BREAKFAST, LUNCH, AND SUPPER    IBUPROFEN (ADVIL;MOTRIN) 400 MG TABLET    Take 1 tablet by mouth every 6 hours as needed    INSULIN GLARGINE (LANTUS SOLOSTAR) 100 UNIT/ML INJECTION    Inject 10 Units into the skin nightly    INSULIN GLARGINE (LANTUS;BASAGLAR) 100 UNIT/ML INJECTION PEN    48 Units daily    LANCETS MISC    Uni stick 28 gauge lancets to use to test glucose QID    LEVOTHYROXINE (SYNTHROID) 50 MCG TABLET    Take 1 tablet by mouth every morning (before breakfast)    MAGNESIUM CITRATE (CITROMA) SOLN    Take 296 mLs by mouth daily as needed    MULTIPLE VITAMINS-IRON (TAB-A-VITE/IRON) TABS        PANTOPRAZOLE (PROTONIX) 40 MG TABLET    TAKE 1 TABLET BY MOUTH ONCE A DAY    QUETIAPINE (SEROQUEL) 50 MG TABLET    TAKE 1 TABLET BY MOUTH TWICE DAILY.    ROSUVASTATIN (CRESTOR) 10 MG TABLET  TAKE 1 TABLET BY MOUTH AT NIGHT    SODIUM PHOSPHATE (FLEET) 7-19 GM/118ML    Place 1 enema rectally daily as needed    TAMSULOSIN (FLOMAX) 0.4 MG CAPSULE    TAKE 1 CAPSULE BY MOUTH ONCE DAILY.        Anticoagulants / Antiplatelet medications:  apixaban Tabs - 2.5 MG    Allergies:    Shellfish allergy    Review of Systems  See history of present illness for relevant review of systems.    ED Triage Vitals   BP Temp Temp Source Pulse Respirations SpO2 Height Weight - Scale   12/31/22 1919 12/31/22 1919 12/31/22 1919 12/31/22 1919 12/31/22 1919 12/31/22 1919 -- 12/31/22 1920   131/66 97.6 F (36.4 C) Infrared 66 16 98 %  63.5 kg (140 lb)     Physical Exam  General:  Well-developed, well-nourished.  Head:  Normocephalic, atraumatic.  Eyes:  Sclera is anicteric and conjunctivae is not injected.  ENT:  Stark Klein appears normal bilaterally. No nasal deformity.  Neck:  Supple, trachea midline.  Heart:  Regular rate and rhythm with no murmurs rubs or gallops.  Lungs:  Clear to auscultation bilaterally. No increased work of breathing noted.  Abdomen:  Soft, nontender, normoactive bowel sounds.  Extremities:  Warm and well perfused.  Skin:  Visible skin exam negative for any rashes or lesions.  Neuro:  Patient moving all extremities without difficulty.      Medical Decision Making  Amount and/or Complexity of Data Reviewed  Labs: ordered.  Radiology: ordered.  ECG/medicine tests: ordered.    Risk  Prescription drug management.  Decision regarding hospitalization.    The patient is an 82 year old male with a history of type 1 diabetes, CVA with left hemiplegia, Alzheimer's, hyperlipidemia, hypertension, hypothyroidism, and chronic atrial fibrillation (on Eliquis) who is presenting today from the memory care unit at [facility] with nausea and vomiting all day today.  Patient has no complaints and has a normal physical exam.  His vital signs are all within normal limits.  He has not vomited since he has been here.  I will give the patient an ODT Zofran and have him orally challenged.  If he is able to pass an oral challenge in his labs and COVID/influenza/RSV test do not show any significant abnormalities I will  plan to discharge him home to follow-up as an outpatient.        Additional information was gathered from the following independent historian(s):  - relative(s)    ED Course:  ED Course as of 01/01/23 1238   Fri Dec 31, 2022   2352 EKG was obtained from triage at 2025 hours and interpreted by myself as follows:  Sinus rhythm with a rate of 68.  Axis is left axis deviation.  PR interval is prolonged at 294.  QTC interval is prolonged at 47.  Patient has some T-wave inversions in V2-V6 with some slight ST depressions in V5 and V6.  Previous EKGs appear to be paced.  This EKG does not appear to be paced but I am concerned that the T-wave inversions maybe memory T-waves not necessarily indicative of acute ischemia.  Patient does not have any chest pain and I do not feel that any additional workup is necessary at this time, as I would likely have not given gotten an EKG for this patient.. [AM]   Sat Jan 01, 2023   0407 Patient's COVID, RSV, and influenza tests were negative.  Patient's white blood cell count  was elevated significantly at 17.0.  The rest of the CBC did not show any abnormalities.  Because of this elevation, I elected to order a CT scan of the patient's abdomen pelvis, looking for any acute intra-abdominal pathology, especially since the patient is not a good historian.  Patient's CMP did not show any significant abnormalities besides a slightly elevated glucose of 145 and a slightly elevated BUN of 22.  Patient's lipase was normal at 25.  The patient's magnesium was normal at 1.7.  Patient's lactic acid was somewhat elevated at 2.3.  The patient was given a L of IV fluids.  Because of the patient's elevated white blood cell count, in addition to the patient's EKG abnormalities, I elected to obtain a set of troponins.  Patient's first troponin was elevated at 24.  The patient will need to stay for second troponin to ensure that this is not further elevated. [AM]   [id number] Patient's lactic acid went from  2.3-2.4 despite a L of IV fluids.  Patient's troponin went from 24 to 29.  And discussing the EKG with the nurse, he tells me that he did order the EKG because patient was complaining of chest pain at some point tonight.  Given this, the patient's T-wave inversions in the precordial leads may be concerning for [facility] T-waves as well and therefore I did decide to discuss the case with the cardiologist on-call regarding with a recommend doing.  Unfortunately, the patient did have another episode of vomiting here and I did order some IV Zofran for this. [AM]   [id number] I discussed the case with the cardiologist on call, Dr. Hessie Dibble, who is relatively unimpressive with the patient's story.  She also felt that it has a possibility that the patient's T-wave inversions were created by recent pacing by the patient's pacemaker.    She feels that the patient's troponins are not elevating significantly and would recommend getting a third troponin and if not further elevating, the patient could be likely d/c'd back.  If the patient's troponins continue to elevate, she will continue to be available this morning for consultation.    Shortly after I checked up on the patient, he became bit more disoriented and was pulling at things, including his IV.   I did order a repeat EKG to assess whether the patient's T-wave inversions are persistent or transient.  Patient is also scheduled for repeat lactate and I did order another L of IV fluids for the patient.    The patient will be signed out to the oncoming Emergency Department physician pending repeat lactate and repeat troponin with plan for possible discharge if either of these are not further elevated. [AM]   [id number] Patient's repeat EKG was obtained at 0649 hours and was interpreted by myself as follows:  V paced rhythm with a rate of 65.  T-waves are all upright accept in B1 in lead aVL.  This EKG appears quite similar to the EKG performed on Mar 30, 2022. [AM]   1150 Troponin increased  from 29 to 37.  Plan to admit.  [CL]   1227 Lactate is increased again but patient is drinking lots of fluids - drank 4 glasses of water this morning.  No further vomiting.  He is comfortable without complaints.  Covid/flu/RSV negative.  Will make sure we have blood cultures and re-check WBC.  Will also check CXR and UA.  [CL]   1231 Will also start IV ceftriaxone [CL]   78 Spoke with the daughter  at the bedside.  Updated with results and new orders and plan for admit.  [CL]   1237 Spoke with Dr. Daniel Nones who will admit.  [CL]      ED Course User Index  [AM] Meagher, Vianne Bulls, MD  [CL] Shade Kaley, Gillermo Murdoch, MD       Procedures    Vital Signs for this visit:  Vitals:    01/01/23 0745 01/01/23 0815 01/01/23 1057 01/01/23 1155   BP: (!) 127/118 135/74 121/82 (!) 176/95   Pulse:       Resp:       Temp:       TempSrc:       SpO2: 99%  98%    Weight:           Lab orders and findings within the past 24 hours that I have personally reviewed and interpreted during this visit.   Recent Results (from the past 24 hour(s))   EKG 12 Lead (Chest Pain)    Collection Time: 01/01/23 12:00 AM   Result Value Ref Range    Heart Rate 65 bpm    EKG RR INTERVAL 923 ms    Atrial Rate 0 ms    P-R Interval 264 ms    EKG P DURATION 0 ms    EKG P HORIZONTAL AXIS 240 deg    EKG P FRONT AXIS 0 deg    EKG Q ONSET 502 ms    EKG QRSD INTERVAL 169 ms    Q-T Interval 587 ms    EKG QTCB 611 ms    EKG QTCF 603 ms    EKG QRS HORIZONTAL AXIS 235 deg    EKG QRS AXIS -73 deg    EKG I-40 HORIZONTAL AXIS 236 deg    EKG I-40 FRONT AXIS -72 deg    EKG T-40 HORIZONTAL AXIS 231 deg    EKG T-40 FRONT AXIS -69 deg    EKG T HORIZONTAL AXIS 60 deg    EKG T WAVE AXIS 98 deg    EKG S-T HORIZONTAL AXIS 82 deg    EKG S-T FRONT AXIS 128 deg   COVID-19, Flu A/B, and RSV Combo    Collection Time: 01/01/23 12:05 AM    Specimen: Nasopharyngeal   Result Value Ref Range    Source Nasopharyngeal     SARS-CoV-2, PCR Negative Negative    Influenza A by PCR Negative Negative     Influenza B by PCR Negative Negative    RSV by PCR Negative Negative   CBC with Auto Differential    Collection Time: 01/01/23 12:52 AM   Result Value Ref Range    WBC 17.0 (H) 4.8 - 10.8 K/uL    RBC 4.97 4.50 - 6.00 M/uL    Hemoglobin 15.3 14.0 - 18.0 g/dL    Hematocrit 46.3 42.0 - 52.0 %    MCV 93.2 80.0 - 100.0 FL    MCH 30.8 28.0 - 34.0 PG    MCHC 33.0 32.0 - 36.0 g/dL    RDW 13.0 11.5 - 13.5 %    Platelets 201 150 - 400 K/uL    MPV 10.5 7.0 - 12.0 FL    Seg Neutrophils 87 %    Lymphocytes 5 %    Monocytes 7 %    Eosinophils % 0 %    Basophils 0 %    Immature Granulocytes 1 %    Segs Absolute 15.0 (H) 1.9 - 7.8 K/UL    Absolute Lymph # 0.8 (L)  1.0 - 4.5 K/UL    Absolute Mono # 1.1 (H) 0.1 - 0.8 K/UL    Absolute Eos # 0.0 0.0 - 0.5 K/UL    Basophils Absolute 0.0 0.0 - 0.2 K/UL    Absolute Immature Granulocyte 0.1 0.0 - 0.1 K/UL    Nucleated RBCs 0.0 PER 100 WBC    nRBC 0.00 K/uL    Differential Type AUTOMATED    CMP    Collection Time: 01/01/23 12:52 AM   Result Value Ref Range    Sodium 140 135 - 145 mmol/L    Potassium 3.7 3.5 - 5.0 mmol/L    Chloride 102 100 - 110 mmol/L    CO2 22 20 - 32 mmol/L    Anion Gap 20 mmol/L    Glucose 145 (H) 75 - 110 mg/dL    BUN 22 (H) 7 - 20 MG/DL    Creatinine 1.11 0.40 - 1.20 MG/DL    Bun/Cre Ratio 20     Est, Glom Filt Rate >60 ml/min/1.48m    Calcium 9.1 8.8 - 10.5 MG/DL    Total Bilirubin 1.10 0.10 - 1.20 mg/dL    ALT 21 3 - 35 U/L    AST 33 15 - 40 U/L    Alk Phosphatase 74 35 - 100 U/L    Total Protein 7.8 6.2 - 8.0 g/dL    Albumin 3.8 3.5 - 5.0 g/dL    Globulin 4.0 g/dL    Albumin/Globulin Ratio 1.0     Lactate, Sepsis    Collection Time: 01/01/23 12:52 AM   Result Value Ref Range    Lactic Acid, Sepsis 2.3 (HH) 0.5 - 2.0 MMOL/L   Lipase    Collection Time: 01/01/23 12:52 AM   Result Value Ref Range    Lipase 25 10 - 58 U/L   Magnesium    Collection Time: 01/01/23 12:52 AM   Result Value Ref Range    Magnesium 1.7 1.7 - 2.5 mg/dL   Troponin    Collection Time: 01/01/23  12:52 AM   Result Value Ref Range    Troponin, High Sensitivity 24 (HH) 0.00 - 20.00 pg/mL   Troponin    Collection Time: 01/01/23  4:02 AM   Result Value Ref Range    Troponin, High Sensitivity 29 (HH) 0.00 - 20.00 pg/mL   Lactate, Sepsis    Collection Time: 01/01/23  4:02 AM   Result Value Ref Range    Lactic Acid, Sepsis 2.4 (HH) 0.5 - 2.0 MMOL/L   Troponin    Collection Time: 01/01/23 10:15 AM   Result Value Ref Range    Troponin, High Sensitivity 37 (HH) 0.00 - 20.00 pg/mL   Lactate, Sepsis    Collection Time: 01/01/23 11:05 AM   Result Value Ref Range    Lactic Acid, Sepsis 3.5 (HH) 0.5 - 2.0 MMOL/L       Recent radiology studies including this visit.  I have personally reviewed these studies and my personal interpretation, if available, is documented in the ED Course:  CT ABDOMEN PELVIS W IV CONTRAST Additional Contrast? None   Final Result   No significant abnormality identified.                  THIS IS AN ELECTRONICALLY VERIFIED REPORT   Dictated By:  BPaulita CradleMD   Dictated:  01/01/2023 02:20:43   Transcribed By:  Self-Edited - PowerScribe   Transcribed:  01/01/2023 02:20:43   Signed By:  BPaulita CradleMD  Signed:  01/01/2023 02:24:41      Providers for questions regarding this report,   Monday - Friday 8am-5pm call [facility] Support at [phone].   From 5pm-7am everyday call [facility] Reading Room at [phone].   WSN: [id number]      XR CHEST (2 VW)    (Results Pending)       Medications given in the ED:  Medications   cefTRIAXone (ROCEPHIN) 1,000 mg in sodium chloride 0.9 % 50 mL IVPB (mini-bag) (has no administration in time range)   ondansetron (ZOFRAN-ODT) disintegrating tablet 4 mg (4 mg SubLINGual Given 12/31/22 2356)   iohexol (OMNIPAQUE 350) solution 71 mL (71 mLs IntraVENous Given 01/01/23 0156)   sodium chloride 0.9 % bolus 1,000 mL (0 mLs IntraVENous Stopped 01/01/23 0553)   ondansetron (ZOFRAN) injection 4 mg (4 mg IntraVENous Given 01/01/23 0430)   sodium  chloride 0.9 % bolus 1,000 mL (0 mLs IntraVENous Stopped 01/01/23 1100)       Diagnosis:  1. Elevated troponin    2. Lactic acidosis            Condition at disposition:  ongoing    DISPOSITION Decision To Admit 01/01/2023 12:23:43 PM  admit    Discharge prescriptions and/or changes if applicable:       Medication List        CONTINUE taking these medications      BD AutoShield Duo 30G X 5 MM Misc  Generic drug: Insulin Pen Needle  1 each by Does not apply route in the morning, at noon, and at bedtime     Lancets Misc  Uni stick 28 gauge lancets to use to test glucose QID            ASK your doctor about these medications      acetaminophen 325 MG tablet  Commonly known as: TYLENOL  Take 2 tablets by mouth every 4 hours as needed for Pain or Fever     amLODIPine 10 MG tablet  Commonly known as: NORVASC     apixaban 2.5 MG Tabs tablet  Commonly known as: ELIQUIS     benazepril 10 MG tablet  Commonly known as: LOTENSIN     bisacodyl 10 MG suppository  Commonly known as: DULCOLAX     cetirizine 10 MG tablet  Commonly known as: ZYRTEC  Take 1 tablet by mouth daily     donepezil 10 MG tablet  Commonly known as: ARICEPT     glucagon 1 MG injection     HumaLOG KwikPen 100 UNIT/ML Sopn  Generic drug: insulin lispro (1 Unit Dial)  INJECT 7-13 UNITS UNDER THE SKIN AT BREAKFAST, LUNCH, AND SUPPER     ibuprofen 400 MG tablet  Commonly known as: ADVIL;MOTRIN     * insulin glargine 100 UNIT/ML injection  Commonly known as: LANTUS SOLOSTAR     * insulin glargine 100 UNIT/ML injection pen  Commonly known as: LANTUS;BASAGLAR     levothyroxine 50 MCG tablet  Commonly known as: SYNTHROID     magnesium citrate Soln  Commonly known as: CITROMA     pantoprazole 40 MG tablet  Commonly known as: PROTONIX     Prodigy No Coding Blood Gluc strip  Generic drug: blood glucose test strips  Use to check blood sugars 3 times daily E11.65 on insulin     QUEtiapine 50 MG tablet  Commonly known as: SEROQUEL     rosuvastatin 10 MG tablet  Commonly  known as: CRESTOR  TAKE 1  TABLET BY MOUTH AT NIGHT     sodium phosphate 7-19 GM/118ML     Tab-A-Vite/Iron Tabs     tamsulosin 0.4 MG capsule  Commonly known as: FLOMAX  TAKE 1 CAPSULE BY MOUTH ONCE DAILY.           * This list has 2 medication(s) that are the same as other medications prescribed for you. Read the directions carefully, and ask your doctor or other care provider to review them with you.                  Follow-up if applicable:  No follow-up provider specified.    Please note that portions of this document were created using the M*Modal Fluency Direct dictation system.  Any inconsistencies or typographical errors may be the result of mis-transcription that persist in spite of proof-reading and should be addressed with the document creator.       Prudence Davidson, MD  01/01/23 [phone]

## 2022-12-31 NOTE — ED Triage Notes (Signed)
Patient presents from memory care unit at [facility]. Patient is A&Ox4 with hx of dementia. Patient reportedly has been vomiting all day. Had an episode of projectile emesis that hit a CRMA which prompted them to call EMS.

## 2023-01-01 ENCOUNTER — Emergency Department: Admit: 2023-01-01 | Payer: MEDICARE | Primary: Registered Nurse

## 2023-01-01 ENCOUNTER — Inpatient Hospital Stay
Admission: EM | Admit: 2023-01-01 | Discharge: 2023-01-05 | Disposition: A | Discharge status: Home Health | Payer: Medicare Other

## 2023-01-01 LAB — LACTATE, SEPSIS
Lactic Acid, Sepsis: 2.3 MMOL/L (ref 0.5–2.0)
Lactic Acid, Sepsis: 2.4 MMOL/L (ref 0.5–2.0)
Lactic Acid, Sepsis: 3.5 MMOL/L (ref 0.5–2.0)
Lactic Acid, Sepsis: 2.4 MMOL/L (ref 0.5–2.0)

## 2023-01-01 LAB — COVID-19, FLU A/B, AND RSV COMBO
Influenza A by PCR: NEGATIVE
Influenza B by PCR: NEGATIVE
RSV by PCR: NEGATIVE
SARS-CoV-2, PCR: NEGATIVE

## 2023-01-01 LAB — COMPREHENSIVE METABOLIC PANEL
ALT: 21 U/L (ref 3–35)
AST: 33 U/L (ref 15–40)
Albumin/Globulin Ratio: 1
Albumin: 3.8 g/dL (ref 3.5–5.0)
Alk Phosphatase: 74 U/L (ref 35–100)
Anion Gap: 20 mmol/L
BUN: 22 MG/DL — ABNORMAL HIGH (ref 7–20)
Bun/Cre Ratio: 20
CO2: 22 mmol/L (ref 20–32)
Calcium: 9.1 MG/DL (ref 8.8–10.5)
Chloride: 102 mmol/L (ref 100–110)
Creatinine: 1.11 MG/DL (ref 0.40–1.20)
Est, Glom Filt Rate: >60 mL/min/{1.73_m2}
Globulin: 4 g/dL
Glucose: 145 mg/dL — ABNORMAL HIGH (ref 75–110)
Potassium: 3.7 mmol/L (ref 3.5–5.0)
Sodium: 140 mmol/L (ref 135–145)
Total Bilirubin: 1.1 mg/dL (ref 0.10–1.20)
Total Protein: 7.8 g/dL (ref 6.2–8.0)

## 2023-01-01 LAB — CBC WITH AUTO DIFFERENTIAL
Absolute Eos #: 0 10*3/uL (ref 0.0–0.5)
Absolute Eos #: 0 10*3/uL (ref 0.0–0.5)
Absolute Immature Granulocyte: 0.1 10*3/uL (ref 0.0–0.1)
Absolute Immature Granulocyte: 0.1 10*3/uL (ref 0.0–0.1)
Absolute Lymph #: 0.8 10*3/uL — ABNORMAL LOW (ref 1.0–4.5)
Absolute Lymph #: 0.7 10*3/uL — ABNORMAL LOW (ref 1.0–4.5)
Absolute Mono #: 1.1 10*3/uL — ABNORMAL HIGH (ref 0.1–0.8)
Absolute Mono #: 1 10*3/uL — ABNORMAL HIGH (ref 0.1–0.8)
Basophils Absolute: 0 10*3/uL (ref 0.0–0.2)
Basophils Absolute: 0 10*3/uL (ref 0.0–0.2)
Basophils: 0 %
Basophils: 0 %
Eosinophils %: 0 %
Eosinophils %: 0 %
Hematocrit: 46.3 % (ref 42.0–52.0)
Hematocrit: 42 % (ref 42.0–52.0)
Hemoglobin: 15.3 g/dL (ref 14.0–18.0)
Hemoglobin: 14.1 g/dL (ref 14.0–18.0)
Immature Granulocytes: 1 %
Immature Granulocytes: 1 %
Lymphocytes: 5 %
Lymphocytes: 5 %
MCH: 30.8 PG (ref 28.0–34.0)
MCH: 29.9 PG (ref 28.0–34.0)
MCHC: 33 g/dL (ref 32.0–36.0)
MCHC: 33.6 g/dL (ref 32.0–36.0)
MCV: 93.2 FL (ref 80.0–100.0)
MCV: 89.2 FL (ref 80.0–100.0)
MPV: 10.5 FL (ref 7.0–12.0)
MPV: 10.5 FL (ref 7.0–12.0)
Monocytes: 7 %
Monocytes: 8 %
Nucleated RBCs: 0 PER 100 WBC
Nucleated RBCs: 0 PER 100 WBC
Platelets: 201 10*3/uL (ref 150–400)
Platelets: 186 10*3/uL (ref 150–400)
RBC: 4.97 M/uL (ref 4.50–6.00)
RBC: 4.71 M/uL (ref 4.50–6.00)
RDW: 13 % (ref 11.5–13.5)
RDW: 12.9 % (ref 11.5–13.5)
Seg Neutrophils: 87 %
Seg Neutrophils: 86 %
Segs Absolute: 15 10*3/uL — ABNORMAL HIGH (ref 1.9–7.8)
Segs Absolute: 10.5 10*3/uL — ABNORMAL HIGH (ref 1.9–7.8)
WBC: 17 10*3/uL — ABNORMAL HIGH (ref 4.8–10.8)
WBC: 12.3 10*3/uL — ABNORMAL HIGH (ref 4.8–10.8)
nRBC: 0 10*3/uL
nRBC: 0 10*3/uL

## 2023-01-01 LAB — CBC
Hematocrit: 42.8 % (ref 42.0–52.0)
Hemoglobin: 14.3 g/dL (ref 14.0–18.0)
MCH: 30.1 PG (ref 28.0–34.0)
MCHC: 33.4 g/dL (ref 32.0–36.0)
MCV: 90.1 FL (ref 80.0–100.0)
MPV: 10.4 FL (ref 7.0–12.0)
Platelets: 199 10*3/uL (ref 150–400)
RBC: 4.75 M/uL (ref 4.50–6.00)
RDW: 13 % (ref 11.5–13.5)
WBC: 13.5 10*3/uL — ABNORMAL HIGH (ref 4.8–10.8)

## 2023-01-01 LAB — LIPASE: Lipase: 25 U/L (ref 10–58)

## 2023-01-01 LAB — TROPONIN
Troponin, High Sensitivity: 24 pg/mL (ref 0.00–20.00)
Troponin, High Sensitivity: 29 pg/mL (ref 0.00–20.00)
Troponin, High Sensitivity: 37 pg/mL (ref 0.00–20.00)
Troponin, High Sensitivity: 40 pg/mL (ref 0.00–20.00)

## 2023-01-01 LAB — MAGNESIUM: Magnesium: 1.7 mg/dL (ref 1.7–2.5)

## 2023-01-01 LAB — POCT GLUCOSE: POC Glucose: 175 mg/dL

## 2023-01-01 LAB — APTT: APTT: >135 s (ref 22.5–33.0)

## 2023-01-01 LAB — TSH: TSH: 4.671 u[IU]/mL (ref 0.340–5.600)

## 2023-01-01 MED ORDER — AMLODIPINE BESYLATE 5 MG PO TABS
5 MG | Freq: Every day | ORAL | Status: AC
Start: 2023-01-01 — End: 2023-01-05
  Administered 2023-01-01 – 2023-01-05 (×5): 10 mg via ORAL

## 2023-01-01 MED ORDER — INSULIN GLARGINE 100 UNIT/ML SC SOPN
100 UNIT/ML | Freq: Every evening | SUBCUTANEOUS | Status: AC
Start: 2023-01-01 — End: 2023-01-05
  Administered 2023-01-02 – 2023-01-05 (×4): 10 [IU] via SUBCUTANEOUS

## 2023-01-01 MED ORDER — LORATADINE 10 MG PO TABS
10 MG | Freq: Every day | ORAL | Status: AC
Start: 2023-01-01 — End: 2023-01-05
  Administered 2023-01-01 – 2023-01-05 (×5): 10 mg via ORAL

## 2023-01-01 MED ORDER — INSULIN LISPRO (1 UNIT DIAL) 100 UNIT/ML SC SOPN
100 UNIT/ML | Freq: Three times a day (TID) | SUBCUTANEOUS | Status: AC
Start: 2023-01-01 — End: 2023-01-05
  Administered 2023-01-02 (×3): 4 [IU] via SUBCUTANEOUS
  Administered 2023-01-03: 17:00:00 2 [IU] via SUBCUTANEOUS
  Administered 2023-01-03: 14:00:00 4 [IU] via SUBCUTANEOUS

## 2023-01-01 MED ORDER — ASPIRIN 81 MG PO CHEW
81 MG | Freq: Every day | ORAL | Status: AC
Start: 2023-01-01 — End: 2023-01-05
  Administered 2023-01-02 – 2023-01-05 (×4): 81 mg via ORAL

## 2023-01-01 MED ORDER — QUETIAPINE FUMARATE 25 MG PO TABS
25 MG | Freq: Two times a day (BID) | ORAL | Status: AC
Start: 2023-01-01 — End: 2023-01-05
  Administered 2023-01-02 – 2023-01-05 (×8): 50 mg via ORAL

## 2023-01-01 MED ORDER — POTASSIUM BICARB-CITRIC ACID 20 MEQ PO TBEF
20 MEQ | ORAL | Status: AC | PRN
Start: 2023-01-01 — End: 2023-01-05

## 2023-01-01 MED ORDER — INSULIN GLARGINE 100 UNIT/ML SC SOPN
100 UNIT/ML | Freq: Every day | SUBCUTANEOUS | Status: AC
Start: 2023-01-01 — End: 2023-01-05
  Administered 2023-01-02 – 2023-01-05 (×4): 30 [IU] via SUBCUTANEOUS

## 2023-01-01 MED ORDER — PERFLUTREN LIPID MICROSPHERE IV SUSP
Freq: Once | INTRAVENOUS | Status: DC | PRN
Start: 2023-01-01 — End: 2023-01-03

## 2023-01-01 MED ORDER — HEPARIN SOD (PORCINE) IN D5W 100 UNIT/ML IV SOLN
100 UNIT/ML | INTRAVENOUS | Status: AC
Start: 2023-01-01 — End: 2023-01-05
  Administered 2023-01-01: 20:00:00 12 [IU]/kg/h via INTRAVENOUS
  Administered 2023-01-03: 01:00:00 8 [IU]/kg/h via INTRAVENOUS
  Administered 2023-01-04 – 2023-01-05 (×2): 10 [IU]/kg/h via INTRAVENOUS

## 2023-01-01 MED ORDER — ONDANSETRON HCL 4 MG/2ML IJ SOLN
4 | Freq: Once | INTRAMUSCULAR | Status: AC
Start: 2023-01-01 — End: 2023-01-01
  Administered 2023-01-01: 10:00:00 4 mg via INTRAVENOUS

## 2023-01-01 MED ORDER — POTASSIUM CHLORIDE 10 MEQ/100ML IV SOLN
10100 MEQ/0ML | INTRAVENOUS | Status: AC | PRN
Start: 2023-01-01 — End: 2023-01-05
  Administered 2023-01-02 (×6): 10 meq via INTRAVENOUS

## 2023-01-01 MED ORDER — ONDANSETRON 4 MG PO TBDP
4 | Freq: Once | ORAL | Status: AC
Start: 2023-01-01 — End: 2022-12-31
  Administered 2023-01-01: 05:00:00 4 mg via SUBLINGUAL

## 2023-01-01 MED ORDER — INSULIN LISPRO (1 UNIT DIAL) 100 UNIT/ML SC SOPN
100 UNIT/ML | Freq: Every evening | SUBCUTANEOUS | Status: AC
Start: 2023-01-01 — End: 2023-01-05

## 2023-01-01 MED ORDER — ACETAMINOPHEN 325 MG PO TABS
325 | Freq: Four times a day (QID) | ORAL | Status: DC | PRN
Start: 2023-01-01 — End: 2023-01-05
  Administered 2023-01-01 – 2023-01-03 (×3): 650 mg via ORAL

## 2023-01-01 MED ORDER — ONDANSETRON HCL 4 MG/2ML IJ SOLN
42 MG/2ML | Freq: Four times a day (QID) | INTRAMUSCULAR | Status: AC | PRN
Start: 2023-01-01 — End: 2023-01-05
  Administered 2023-01-03: 10:00:00 4 mg via INTRAVENOUS

## 2023-01-01 MED ORDER — IOHEXOL 350 MG/ML IV SOLN
350 | Freq: Once | INTRAVENOUS | Status: AC | PRN
Start: 2023-01-01 — End: 2023-01-01
  Administered 2023-01-01: 07:00:00 71 mL via INTRAVENOUS

## 2023-01-01 MED ORDER — PANTOPRAZOLE SODIUM 40 MG PO TBEC
40 MG | Freq: Every day | ORAL | Status: AC
Start: 2023-01-01 — End: 2023-01-05
  Administered 2023-01-01 – 2023-01-05 (×5): 40 mg via ORAL

## 2023-01-01 MED ORDER — ACETAMINOPHEN 650 MG RE SUPP
650 | Freq: Four times a day (QID) | RECTAL | Status: DC | PRN
Start: 2023-01-01 — End: 2023-01-05

## 2023-01-01 MED ORDER — POTASSIUM CHLORIDE CRYS ER 20 MEQ PO TBCR
20 MEQ | ORAL | Status: AC | PRN
Start: 2023-01-01 — End: 2023-01-05
  Administered 2023-01-03 – 2023-01-05 (×2): 40 meq via ORAL

## 2023-01-01 MED ORDER — ACETAMINOPHEN 650 MG RE SUPP
650 | Freq: Four times a day (QID) | RECTAL | Status: DC | PRN
Start: 2023-01-01 — End: 2023-01-01

## 2023-01-01 MED ORDER — HEPARIN SODIUM (PORCINE) 1000 UNIT/ML IJ SOLN
1000 UNIT/ML | INTRAMUSCULAR | Status: AC | PRN
Start: 2023-01-01 — End: 2023-01-05

## 2023-01-01 MED ORDER — ONDANSETRON 4 MG PO TBDP
4 MG | Freq: Three times a day (TID) | ORAL | Status: AC | PRN
Start: 2023-01-01 — End: 2023-01-05
  Administered 2023-01-01 – 2023-01-05 (×5): 4 mg via ORAL

## 2023-01-01 MED ORDER — MAGNESIUM SULFATE 2000 MG/50 ML IVPB PREMIX
250 GM/50ML | INTRAVENOUS | Status: AC | PRN
Start: 2023-01-01 — End: 2023-01-05

## 2023-01-01 MED ORDER — ATORVASTATIN CALCIUM 40 MG PO TABS
40 MG | Freq: Every evening | ORAL | Status: AC
Start: 2023-01-01 — End: 2023-01-05
  Administered 2023-01-02 – 2023-01-05 (×4): 80 mg via ORAL

## 2023-01-01 MED ORDER — SODIUM CHLORIDE 0.9 % IV BOLUS
0.9 | Freq: Once | INTRAVENOUS | Status: AC
Start: 2023-01-01 — End: 2023-01-01
  Administered 2023-01-01: 12:00:00 1000 mL via INTRAVENOUS

## 2023-01-01 MED ORDER — DONEPEZIL HCL 5 MG PO TABS
5 MG | Freq: Every evening | ORAL | Status: AC
Start: 2023-01-01 — End: 2023-01-05
  Administered 2023-01-02 – 2023-01-05 (×4): 5 mg via ORAL

## 2023-01-01 MED ORDER — POLYETHYLENE GLYCOL 3350 17 G PO PACK
17 g | Freq: Every day | ORAL | Status: AC | PRN
Start: 2023-01-01 — End: 2023-01-05

## 2023-01-01 MED ORDER — TAMSULOSIN HCL 0.4 MG PO CAPS
0.4 MG | Freq: Every day | ORAL | Status: AC
Start: 2023-01-01 — End: 2023-01-05
  Administered 2023-01-01 – 2023-01-05 (×5): 0.4 mg via ORAL

## 2023-01-01 MED ORDER — ACETAMINOPHEN 325 MG PO TABS
325 | Freq: Four times a day (QID) | ORAL | Status: DC | PRN
Start: 2023-01-01 — End: 2023-01-01

## 2023-01-01 MED ORDER — LISINOPRIL 10 MG PO TABS
10 MG | Freq: Every day | ORAL | Status: AC
Start: 2023-01-01 — End: 2023-01-05
  Administered 2023-01-01 – 2023-01-05 (×5): 10 mg via ORAL

## 2023-01-01 MED ORDER — LEVOTHYROXINE SODIUM 50 MCG PO TABS
50 MCG | Freq: Every day | ORAL | Status: AC
Start: 2023-01-01 — End: 2023-01-05
  Administered 2023-01-02 – 2023-01-05 (×4): 50 ug via ORAL

## 2023-01-01 MED ORDER — CEFTRIAXONE SODIUM 1 G IJ SOLR
1 | INTRAMUSCULAR | Status: AC
Start: 2023-01-01 — End: 2023-01-01
  Administered 2023-01-01: 20:00:00 1000 mg via INTRAVENOUS

## 2023-01-01 MED ORDER — HEPARIN SODIUM (PORCINE) 1000 UNIT/ML IJ SOLN
1000 | Freq: Once | INTRAMUSCULAR | Status: AC
Start: 2023-01-01 — End: 2023-01-01
  Administered 2023-01-01: 20:00:00 4460 [IU]/kg via INTRAVENOUS

## 2023-01-01 MED ORDER — SODIUM CHLORIDE 0.9 % IV BOLUS
0.9 | Freq: Once | INTRAVENOUS | Status: AC
Start: 2023-01-01 — End: 2023-01-01
  Administered 2023-01-01: 09:00:00 1000 mL via INTRAVENOUS

## 2023-01-01 MED ORDER — NORMAL SALINE FLUSH 0.9 % IV SOLN
0.9 % | INTRAVENOUS | Status: AC | PRN
Start: 2023-01-01 — End: 2023-01-05

## 2023-01-01 MED ORDER — NORMAL SALINE FLUSH 0.9 % IV SOLN
0.9 % | Freq: Two times a day (BID) | INTRAVENOUS | Status: AC
Start: 2023-01-01 — End: 2023-01-05
  Administered 2023-01-02 – 2023-01-05 (×7): 10 mL via INTRAVENOUS

## 2023-01-01 MED ORDER — SODIUM CHLORIDE 0.9 % IV SOLN
0.9 % | INTRAVENOUS | Status: AC | PRN
Start: 2023-01-01 — End: 2023-01-05

## 2023-01-01 MED ORDER — ROSUVASTATIN CALCIUM 10 MG PO TABS
10 | Freq: Every evening | ORAL | Status: DC
Start: 2023-01-01 — End: 2023-01-01

## 2023-01-01 MED ORDER — METOPROLOL TARTRATE 25 MG PO TABS
25 MG | Freq: Two times a day (BID) | ORAL | Status: AC
Start: 2023-01-01 — End: 2023-01-05
  Administered 2023-01-02 – 2023-01-05 (×8): 12.5 mg via ORAL

## 2023-01-01 MED FILL — HEPARIN SOD (PORCINE) IN D5W 100 UNIT/ML IV SOLN: 100 UNIT/ML | INTRAVENOUS | Qty: 250

## 2023-01-01 MED FILL — ONDANSETRON 4 MG PO TBDP: 4 MG | ORAL | Qty: 1

## 2023-01-01 MED FILL — HEPARIN SODIUM (PORCINE) 1000 UNIT/ML IJ SOLN: 1000 UNIT/ML | INTRAMUSCULAR | Qty: 10

## 2023-01-01 MED FILL — SODIUM CHLORIDE 0.9 % IV SOLN: 0.9 % | INTRAVENOUS | Qty: 1000

## 2023-01-01 MED FILL — OMNIPAQUE 350 MG/ML IV SOLN: 350 MG/ML | INTRAVENOUS | Qty: 71

## 2023-01-01 MED FILL — BD POSIFLUSH 0.9 % IV SOLN: 0.9 % | INTRAVENOUS | Qty: 40

## 2023-01-01 MED FILL — ACETAMINOPHEN 325 MG PO TABS: 325 MG | ORAL | Qty: 2

## 2023-01-01 MED FILL — PERFLUTREN LIPID MICROSPHERE IV SUSP: INTRAVENOUS | Qty: 1.5

## 2023-01-01 MED FILL — ONDANSETRON HCL 4 MG/2ML IJ SOLN: 4 MG/2ML | INTRAMUSCULAR | Qty: 2

## 2023-01-01 MED FILL — TAMSULOSIN HCL 0.4 MG PO CAPS: 0.4 MG | ORAL | Qty: 1

## 2023-01-01 MED FILL — CEFTRIAXONE SODIUM 1 G IJ SOLR: 1 g | INTRAMUSCULAR | Qty: 1000

## 2023-01-01 MED FILL — SEMGLEE 100 UNIT/ML SC SOPN: 100 UNIT/ML | SUBCUTANEOUS | Qty: 3

## 2023-01-01 MED FILL — LISINOPRIL 10 MG PO TABS: 10 MG | ORAL | Qty: 1

## 2023-01-01 MED FILL — LORATADINE 10 MG PO TABS: 10 MG | ORAL | Qty: 1

## 2023-01-01 MED FILL — AMLODIPINE BESYLATE 5 MG PO TABS: 5 MG | ORAL | Qty: 2

## 2023-01-01 MED FILL — PANTOPRAZOLE SODIUM 40 MG PO TBEC: 40 MG | ORAL | Qty: 1

## 2023-01-01 NOTE — H&P (Signed)
History and Physical    Subjective:     Alejandro [name] is a 82 y.o. 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 Patient is demented therefore history is taken from staff and ems. He reportedly had multiple episodes of vomiting all day. The vomit his staff in the face while they were caring for him. The patient does not recall this event. The patient feels that he is in his normal state of health and has no complaints including the events described above     Patient himself does not know why he is here.        Past Medical History:   Diagnosis Date    Dementia ([place])     Diabetes ([place])     Grief     loss of wife 2014    Hypertension     Presence of cardiac pacemaker       No past surgical history on file.  No family history on file.   Social History     Tobacco Use    Smoking status: Never    Smokeless tobacco: Never   Substance Use Topics    Alcohol use: Never       Prior to Admission medications    Medication Sig Start Date End Date Taking? Authorizing Provider   donepezil (ARICEPT) 10 MG tablet  09/12/22   [provider]   blood glucose test strips (PRODIGY NO CODING BLOOD GLUC) strip Use to check blood sugars 3 times daily E11.65 on insulin 09/23/22   Cramm, Nickola Major, APRN - NP   BD AUTOSHIELD DUO 30G X 5 MM MISC 1 each by Does not apply route in the morning, at noon, and at bedtime 09/22/22   Cramm, Nickola Major, APRN - NP   Multiple Vitamins-Iron (TAB-A-VITE/IRON) TABS  08/08/22   [provider]   HUMALOG KWIKPEN 100 UNIT/ML SOPN INJECT 7-13 UNITS UNDER THE SKIN AT [place], LUNCH, AND SUPPER 06/15/22   Sparlin, Beatriz Stallion, APRN - NP   insulin glargine (LANTUS SOLOSTAR) 100 UNIT/ML injection Inject 10 Units into the skin nightly    [provider]   sodium phosphate (FLEET) 7-19 GM/118ML Place 1 enema rectally daily as needed     [provider]   glucagon 1 MG injection Inject 1 mg into the muscle as needed    [provider]   Lancets MISC Uni stick 28 gauge lancets to use to test glucose QID 03/15/22   Cramm, Nickola Major, APRN - NP   rosuvastatin (CRESTOR) 10 MG tablet TAKE 1 TABLET BY MOUTH AT NIGHT 01/28/22   Cramm, Nickola Major, APRN - NP   tamsulosin (FLOMAX) 0.4 MG capsule TAKE 1 CAPSULE BY MOUTH ONCE DAILY. 11/04/21   Abbey Chatters, APRN - NP   cetirizine (ZYRTEC) 10 MG tablet Take 1 tablet by mouth daily 09/17/21   Cramm, Nickola Major, APRN - NP   acetaminophen (TYLENOL) 325 MG tablet Take 2 tablets by mouth every 4 hours as needed for Pain or Fever 09/04/21   Cramm, Nickola Major, APRN - NP   amLODIPine (NORVASC) 10 MG tablet TAKE 1 TABLET BY MOUTH EVERY DAY FOR HIGH BLOOD PRESSURE. 04/05/21   Automatic Reconciliation, Ar   apixaban (ELIQUIS) 2.5 MG TABS tablet TAKE 1 TABLET BY MOUTH TWICE DAILY. 07/21/20   Automatic Reconciliation, Ar   benazepril (LOTENSIN)  10 MG tablet Take 1 tablet by mouth daily ceived the following from [facility]: Outside name: benazepriL (LOTENSIN) 10 mg tablet 02/04/21   Automatic Reconciliation, Ar   bisacodyl (DULCOLAX) 10 MG suppository Place 1 suppository rectally daily as needed    Automatic Reconciliation, Ar   ibuprofen (ADVIL;MOTRIN) 400 MG tablet Take 1 tablet by mouth every 6 hours as needed    Automatic Reconciliation, Ar   insulin glargine (LANTUS;BASAGLAR) 100 UNIT/ML injection pen 48 Units daily 11/12/20   Automatic Reconciliation, Ar   levothyroxine (SYNTHROID) 50 MCG tablet Take 1 tablet by mouth every morning (before breakfast) 03/17/21   Automatic Reconciliation, Ar   magnesium citrate (CITROMA) SOLN Take 296 mLs by mouth daily as needed    Automatic Reconciliation, Ar   pantoprazole (PROTONIX) 40 MG tablet TAKE 1 TABLET BY MOUTH ONCE A DAY 09/22/20   Automatic Reconciliation, Ar   QUEtiapine (SEROQUEL) 50 MG tablet TAKE 1 TABLET BY MOUTH TWICE DAILY. 08/19/20   Automatic  Reconciliation, Ar     Allergies   Allergen Reactions    Shellfish Allergy Anaphylaxis        Review of Systems:  As noted above, all other systems were reviewed and otherwise negative    Objective:     Intake and Output:    02/24 0701 - 02/24 1900  In: 1221 [P.O.:221]  Out: -   02/22 1901 - 02/24 0700  In: 1000.1   Out: -       Intake/Output Summary (Last 24 hours) at 01/01/2023 1353  Last data filed at 01/01/2023 1100  Gross per 24 hour   Intake 2221.07 ml   Output --   Net 2221.07 ml      Physical Exam:   BP (!) 176/95   Pulse 70   Temp 97.6 F (36.4 C) (Infrared)   Resp 13   Wt 63.5 kg (140 lb)   SpO2 98%   BMI 19.53 kg/m     General Appearance:    No acute distress, AO1   Head:    Normocephalic, atraumatic   Eyes:    Vision grossly normal    Neck:   Supple   Back:     No deformity   Lungs:     Non labored breathing   Chest wall:    No deformity   Heart:    S1 and S2 normal, aortic systolic murmur 5/6   Abdomen:     No significant tenderness   Extremities:   Extremities without focal deformity   Skin:   Warm, dry   Neurologic:   No focal motor or sensory deficits   Psychiatric:  Cooperative     Data Review:   Recent Results (from the past 12 hour(s))   Troponin    Collection Time: 01/01/23  4:02 AM   Result Value Ref Range    Troponin, High Sensitivity 29 (HH) 0.00 - 20.00 pg/mL   Lactate, Sepsis    Collection Time: 01/01/23  4:02 AM   Result Value Ref Range    Lactic Acid, Sepsis 2.4 (HH) 0.5 - 2.0 MMOL/L   Troponin    Collection Time: 01/01/23 10:15 AM   Result Value Ref Range    Troponin, High Sensitivity 37 (HH) 0.00 - 20.00 pg/mL   Lactate, Sepsis    Collection Time: 01/01/23 11:05 AM   Result Value Ref Range    Lactic Acid, Sepsis 3.5 (HH) 0.5 - 2.0 MMOL/L   CBC with Auto Differential  Collection Time: 01/01/23  1:08 PM   Result Value Ref Range    WBC 12.3 (H) 4.8 - 10.8 K/uL    RBC 4.71 4.50 - 6.00 M/uL    Hemoglobin 14.1 14.0 - 18.0 g/dL    Hematocrit 42.0 42.0 - 52.0 %    MCV 89.2 80.0 - 100.0  FL    MCH 29.9 28.0 - 34.0 PG    MCHC 33.6 32.0 - 36.0 g/dL    RDW 12.9 11.5 - 13.5 %    Platelets 186 150 - 400 K/uL    MPV 10.5 7.0 - 12.0 FL    Seg Neutrophils 86 %    Lymphocytes 5 %    Monocytes 8 %    Eosinophils % 0 %    Basophils 0 %    Immature Granulocytes 1 %    Segs Absolute 10.5 (H) 1.9 - 7.8 K/UL    Absolute Lymph # 0.7 (L) 1.0 - 4.5 K/UL    Absolute Mono # 1.0 (H) 0.1 - 0.8 K/UL    Absolute Eos # 0.0 0.0 - 0.5 K/UL    Basophils Absolute 0.0 0.0 - 0.2 K/UL    Absolute Immature Granulocyte 0.1 0.0 - 0.1 K/UL    Nucleated RBCs 0.0 PER 100 WBC    nRBC 0.00 K/uL    Differential Type AUTOMATED        XR CHEST (2 VW)    Result Date: 01/01/2023  EXAM: RADIOGRAPHS OF THE CHEST INDICATION: ELEVATED WBC AND LACTATE - NO OBVIOUS SOURCE - PATIENT IS DEMENTED.; . COMPARISON: Mar 10, 2022 TECHNIQUE: Region of study: Chest.  Radiograph, 2 views. FINDINGS: There is moderate increase of the interstitial infiltrates in the right lung. Cardiomediastinal silhouette within normal limits.     Moderate increase of the interstitial infiltrates in the right lung suggestive of an overlapping acute interstitial process including atypical pneumonia or edema.     CT ABDOMEN PELVIS W IV CONTRAST Additional Contrast? None    Result Date: 01/01/2023  INTERPRETING PROVIDER:  Paulita Cradle MD EXAMINATION: CT ABDOMEN/PELVIS W/ CONTRAST DATE OF EXAM: 01/01/2023 1:56 AM CLINICAL INFORMATION: Male, 82 years old. Nausea, vomiting, leukocytosis, evaluate for acute intra-abdominal process TECHNIQUE: Axial CT images of the abdomen and pelvis were obtained after IV contrast administration. One or more of these dose optimization techniques were utilized: Automated exposure control; mA and/or kV adjustment per patient size (includes targeted exams where dose is matched to clinical indication); or iterative reconstruction. COMPARISON: Chest 08/06/2020 FINDINGS: Lung bases: Continued groundglass densities in the lung bases likely related to  pulmonary fibrosis. Peritoneum/Retroperitoneum: No free air or free fluid. Vessels: Vascular calcifications. Liver: Unremarkable parenchyma. Gallbladder: Surgical clips mark site of prior cholecystectomy. Biliary: No significant biliary dilation. Spleen: Unremarkable. Pancreas: Unremarkable parenchyma. Adrenal glands: Unremarkable. Kidneys: Nonobstructive renal calculi identified.  No hydronephrosis or hydroureter. Lower GU: Unremarkable bladder. Foregut: Stomach, esophagus, and duodenum are unremarkable. Mid/Hindgut: The intestinal tract is unremarkable. No evidence of obstruction. Appendix is unremarkable. Lymph nodes: No intra-abdominal or retroperitoneal enlarged lymph nodes. No pelvic or inguinal lymphadenopathy. Soft tissues and Body wall: Fat-containing umbilical hernia. Osseous structures: No fracture or dislocation. Mild degenerative changes. No suspicious lytic or sclerotic lesions. Additional comments: None.     No significant abnormality identified. THIS IS AN ELECTRONICALLY VERIFIED REPORT Dictated By:  Paulita Cradle MD Dictated:  01/01/2023 02:20:43 Transcribed By:  Self-Edited - PowerScribe Transcribed:  01/01/2023 02:20:43 Signed By:  Paulita Cradle MD Signed:  01/01/2023 02:24:41 Providers for  questions regarding this report, Monday - Friday 8am-5pm call [facility] at [phone]. From 5pm-7am everyday call [facility] Reading Room at [phone]. WSN: [id number]       I have reviewed and interpreted clinical results    Assessment/Plan:     41yM w Alzheimers presents for projectile vomiting, found with NSTEMI. On heparin drip with echo pending on Monday. GI to be consulted on Monday. TSH blood cultures, UA pending    Patient on decreased lantus pending evaluation of sugars on hospital diet.         Plan:  NSTEMI  -cards consult appreciated, case discussed with Dr Hessie Dibble  -continue heparin drip, asa, bb, trend trop  -echo Monday    Projectile Vomiting  -GI Consult  Monday  -fu blood cultures, UA    T1 DM  -patient takes Lantus 48u qd and 10u qhs. Decreased to 30 and 10 while inpatient. Assess on hospital diet and re increase to home dose if there is hyperglycemia  -sliding scale ordered  -Blood sugar achs    Alzheimers dementia  -aware  -continue namenda    HTN   HLD  Hypothyroid  -continue home meds    #DVT prophylaxis: hep drip  # Code status: full  # Disposition plan: Pending clinical course    This chart was dictated using M Modal, a voice to text software. Attempts were made to edit but it may contain unrecognized errors. Please call the office with any questions or concerns.    Signed By: [name], MD     January 01, 2023

## 2023-01-01 NOTE — Progress Notes (Addendum)
Pt ripped off finger probe. I asked if I could reapply. The pt stated no several times. Will attempt at another time.    Pt continues to request that I help him die.    Lisabeth Pick, CNA  01/01/23 [phone]

## 2023-01-01 NOTE — ED Notes (Signed)
TRANSFER - OUT REPORT:    Verbal report given to Raeline on Alejandro [name]  being transferred to [facility] for routine progression of patient care       Report consisted of patient's Situation, Background, Assessment and   Recommendations(SBAR).     Information from the following report(s) ED Encounter Summary was reviewed with the receiving nurse.    Kinder Fall Assessment:                           Lines:   Peripheral IV 12/31/22 Left Antecubital (Active)       Peripheral IV 01/01/23 Right;Anterior Cephalic (Active)        Opportunity for questions and clarification was provided.      Patient transported with:  Registered Nurse

## 2023-01-01 NOTE — Progress Notes (Signed)
1545: Patient arrived to floor. Patient c/o 10/10 abdominal pain. No PRN's available for over 3/10 pain. Provider communication placed. Diarrhea present upon arrival. Patient cleaned and no further stooling this shift.    1613: Received communication from Lab critical lactic of 2.4. Provider notified. No new orders at this time.    1635: PTT >135. Pharmacy contacted. Await next PTT for adjustment if needed.

## 2023-01-01 NOTE — ED Notes (Signed)
Pt. ate entire meal tray / with beverage  , no assist / nad, without incident of n / v / gi upset.

## 2023-01-01 NOTE — Progress Notes (Addendum)
This Probation officer assigned pt for observation.  Pt is restless and repeatedly requesting to let him die.  Intermittent hiccups.    Lisabeth Pick, CNA  01/01/23 [phone]

## 2023-01-02 LAB — CBC
Hematocrit: 43.3 % (ref 42.0–52.0)
Hematocrit: 41.7 % — ABNORMAL LOW (ref 42.0–52.0)
Hemoglobin: 14.6 g/dL (ref 14.0–18.0)
Hemoglobin: 13.6 g/dL — ABNORMAL LOW (ref 14.0–18.0)
MCH: 30.5 PG (ref 28.0–34.0)
MCH: 30.1 PG (ref 28.0–34.0)
MCHC: 33.7 g/dL (ref 32.0–36.0)
MCHC: 32.6 g/dL (ref 32.0–36.0)
MCV: 90.4 FL (ref 80.0–100.0)
MCV: 92.3 FL (ref 80.0–100.0)
MPV: 11.1 FL (ref 7.0–12.0)
MPV: 10.9 FL (ref 7.0–12.0)
Platelets: 179 10*3/uL (ref 150–400)
Platelets: 167 10*3/uL (ref 150–400)
RBC: 4.79 M/uL (ref 4.50–6.00)
RBC: 4.52 M/uL (ref 4.50–6.00)
RDW: 12.9 % (ref 11.5–13.5)
RDW: 13.2 % (ref 11.5–13.5)
WBC: 18.3 10*3/uL — ABNORMAL HIGH (ref 4.8–10.8)
WBC: 9.1 10*3/uL (ref 4.8–10.8)

## 2023-01-02 LAB — HEPATIC FUNCTION PANEL
ALT: 19 U/L (ref 3–35)
AST: 30 U/L (ref 15–40)
Albumin/Globulin Ratio: 0.9
Albumin: 3.6 g/dL (ref 3.5–5.0)
Alk Phosphatase: 71 U/L (ref 35–100)
Bilirubin, Direct: 0.2 mg/dL (ref 0.00–0.20)
Globulin: 4.1 g/dL
Total Bilirubin: 1.1 mg/dL (ref 0.10–1.20)
Total Protein: 7.7 g/dL (ref 6.2–8.0)

## 2023-01-02 LAB — APTT
APTT: 97 s (ref 22.5–33.0)
APTT: >135 s (ref 22.5–33.0)
APTT: 70.8 s (ref 22.5–33.0)

## 2023-01-02 LAB — RENAL FUNCTION PANEL
Albumin: 3.6 g/dL (ref 3.5–5.0)
Anion Gap: 17 mmol/L
BUN: 22 MG/DL — ABNORMAL HIGH (ref 7–20)
Bun/Cre Ratio: 21
CO2: 20 mmol/L (ref 20–32)
Calcium: 9 MG/DL (ref 8.8–10.5)
Chloride: 106 mmol/L (ref 100–110)
Creatinine: 1.04 MG/DL (ref 0.40–1.20)
Est, Glom Filt Rate: >60 mL/min/{1.73_m2}
Glucose: 213 mg/dL — ABNORMAL HIGH (ref 75–110)
Phosphorus: 3.5 MG/DL (ref 2.4–4.7)
Potassium: 2.5 mmol/L — CL (ref 3.5–5.0)
Sodium: 140 mmol/L (ref 135–145)

## 2023-01-02 LAB — BASIC METABOLIC PANEL W/ REFLEX TO MG FOR LOW K
Anion Gap: 13 mmol/L
BUN: 24 MG/DL — ABNORMAL HIGH (ref 7–20)
Bun/Cre Ratio: 24
CO2: 20 mmol/L (ref 20–32)
Calcium: 8.7 MG/DL — ABNORMAL LOW (ref 8.8–10.5)
Chloride: 104 mmol/L (ref 100–110)
Creatinine: 1.02 MG/DL (ref 0.40–1.20)
Est, Glom Filt Rate: >60 mL/min/{1.73_m2}
Glucose: 226 mg/dL — ABNORMAL HIGH (ref 75–110)
Potassium: 2.9 mmol/L — CL (ref 3.5–5.0)
Sodium: 134 mmol/L — ABNORMAL LOW (ref 135–145)

## 2023-01-02 LAB — LIPID PANEL
Cholesterol: 134 MG/DL (ref 115–200)
HDL: 47 MG/DL (ref 40–60)
LDL Cholesterol: 72.8 MG/DL (ref 65–130)
Non-HDL Cholesterol: 87 mg/dL
Triglycerides: 71 MG/DL (ref 30–150)

## 2023-01-02 LAB — POCT GLUCOSE
POC Glucose: 198 mg/dL
POC Glucose: 261 mg/dL
POC Glucose: 278 mg/dL
POC Glucose: 286 mg/dL

## 2023-01-02 LAB — *Unknown: POC Occult Blood, Fecal: POSITIVE — AB

## 2023-01-02 LAB — HEMOGLOBIN AND HEMATOCRIT
Hematocrit: 42 % (ref 42.0–52.0)
Hemoglobin: 14.4 g/dL (ref 14.0–18.0)

## 2023-01-02 LAB — MAGNESIUM: Magnesium: 1.9 mg/dL (ref 1.7–2.5)

## 2023-01-02 LAB — C. DIFFICILE TOXIN MOLECULAR
027 NAP1: NEGATIVE
Toxigenic C. Diff.: NEGATIVE

## 2023-01-02 LAB — POTASSIUM: Potassium: 3.6 mmol/L (ref 3.5–5.0)

## 2023-01-02 LAB — TROPONIN: Troponin, High Sensitivity: 91 pg/mL (ref 0.00–20.00)

## 2023-01-02 LAB — PHOSPHORUS: Phosphorus: 3.8 MG/DL (ref 2.4–4.7)

## 2023-01-02 LAB — LACTATE, SEPSIS: Lactic Acid, Sepsis: 1.4 MMOL/L (ref 0.5–2.0)

## 2023-01-02 MED ORDER — ALUM & MAG HYDROXIDE-SIMETH 200-200-20 MG/5ML PO SUSP
200-200-205 MG/5ML | Freq: Four times a day (QID) | ORAL | Status: AC | PRN
Start: 2023-01-02 — End: 2023-01-05
  Administered 2023-01-02 – 2023-01-05 (×4): 30 mL via ORAL

## 2023-01-02 MED FILL — ALUM & MAG HYDROXIDE-SIMETH 200-200-20 MG/5ML PO SUSP: 200-200-20 MG/5ML | ORAL | Qty: 30

## 2023-01-02 MED FILL — POTASSIUM CHLORIDE 10 MEQ/100ML IV SOLN: 10 MEQ/0ML | INTRAVENOUS | Qty: 100

## 2023-01-02 MED FILL — DONEPEZIL HCL 5 MG PO TABS: 5 MG | ORAL | Qty: 1

## 2023-01-02 MED FILL — ASPIRIN LOW DOSE 81 MG PO CHEW: 81 MG | ORAL | Qty: 1

## 2023-01-02 MED FILL — PANTOPRAZOLE SODIUM 40 MG PO TBEC: 40 MG | ORAL | Qty: 1

## 2023-01-02 MED FILL — LEVOTHYROXINE SODIUM 50 MCG PO TABS: 50 MCG | ORAL | Qty: 1

## 2023-01-02 MED FILL — QUETIAPINE FUMARATE 25 MG PO TABS: 25 MG | ORAL | Qty: 2

## 2023-01-02 MED FILL — HUMALOG KWIKPEN 100 UNIT/ML SC SOPN: 100 UNIT/ML | SUBCUTANEOUS | Qty: 3

## 2023-01-02 MED FILL — ACETAMINOPHEN 325 MG PO TABS: 325 MG | ORAL | Qty: 2

## 2023-01-02 MED FILL — SEMGLEE 100 UNIT/ML SC SOPN: 100 UNIT/ML | SUBCUTANEOUS | Qty: 3

## 2023-01-02 MED FILL — METOPROLOL TARTRATE 25 MG PO TABS: 25 MG | ORAL | Qty: 1

## 2023-01-02 MED FILL — TAMSULOSIN HCL 0.4 MG PO CAPS: 0.4 MG | ORAL | Qty: 1

## 2023-01-02 MED FILL — LORATADINE 10 MG PO TABS: 10 MG | ORAL | Qty: 1

## 2023-01-02 MED FILL — LISINOPRIL 10 MG PO TABS: 10 MG | ORAL | Qty: 1

## 2023-01-02 MED FILL — ATORVASTATIN CALCIUM 40 MG PO TABS: 40 MG | ORAL | Qty: 2

## 2023-01-02 MED FILL — ONDANSETRON 4 MG PO TBDP: 4 MG | ORAL | Qty: 1

## 2023-01-02 MED FILL — AMLODIPINE BESYLATE 5 MG PO TABS: 5 MG | ORAL | Qty: 2

## 2023-01-02 NOTE — Progress Notes (Signed)
Hospitalist Progress Note      Daily Progress Note: 01/02/2023 3:59 PM    Subjective:     Patient was seen and examined at bedside. Liquid stool, guaic positive. Hep continued due to increased benefit. But will monitor hh     K low, being repleted , pm k being chekced        Current Facility-Administered Medications   Medication Dose Route Frequency    amLODIPine (NORVASC) tablet 10 mg  10 mg Oral Daily    insulin glargine (LANTUS;BASAGLAR) injection pen 30 Units  30 Units SubCUTAneous Daily    levothyroxine (SYNTHROID) tablet 50 mcg  50 mcg Oral QAM AC    pantoprazole (PROTONIX) tablet 40 mg  40 mg Oral Daily    QUEtiapine (SEROQUEL) tablet 50 mg  50 mg Oral BID    loratadine (CLARITIN) tablet 10 mg  10 mg Oral Daily    tamsulosin (FLOMAX) capsule 0.4 mg  0.4 mg Oral Daily    insulin glargine (LANTUS;BASAGLAR) injection pen 10 Units  10 Units SubCUTAneous Nightly    donepezil (ARICEPT) tablet 5 mg  5 mg Oral Nightly    lisinopril (PRINIVIL;ZESTRIL) tablet 10 mg  10 mg Oral Daily    insulin lispro (1 Unit Dial) (HUMALOG/ADMELOG) pen 0-8 Units  0-8 Units SubCUTAneous TID WC    insulin lispro (1 Unit Dial) (HUMALOG/ADMELOG) pen 0-4 Units  0-4 Units SubCUTAneous Nightly    heparin (porcine) injection 2,000 Units  2,000 Units IntraVENous PRN    heparin 25,000 units in dextrose 5% 250 mL (premix) infusion  5-30 Units/kg/hr IntraVENous Continuous    sodium chloride flush 0.9 % injection 5-40 mL  5-40 mL IntraVENous 2 times per day    sodium chloride flush 0.9 % injection 5-40 mL  5-40 mL IntraVENous PRN    0.9 % sodium chloride infusion   IntraVENous PRN    potassium chloride (KLOR-CON M) extended release tablet 40 mEq  40 mEq Oral PRN    Or    potassium bicarb-citric acid (EFFER-K) effervescent tablet 40 mEq  40 mEq Oral PRN    Or    potassium chloride 10 mEq/100 mL IVPB (Peripheral Line)  10 mEq IntraVENous PRN    magnesium sulfate 2000 mg in 50 mL IVPB premix  2,000 mg IntraVENous PRN    ondansetron (ZOFRAN-ODT)  disintegrating tablet 4 mg  4 mg Oral Q8H PRN    Or    ondansetron (ZOFRAN) injection 4 mg  4 mg IntraVENous Q6H PRN    polyethylene glycol (GLYCOLAX) packet 17 g  17 g Oral Daily PRN    aspirin chewable tablet 81 mg  81 mg Oral Daily    atorvastatin (LIPITOR) tablet 80 mg  80 mg Oral Nightly    perflutren lipid microspheres (DEFINITY) injection 1.5 mL  1.5 mL IntraVENous ONCE PRN    metoprolol tartrate (LOPRESSOR) tablet 12.5 mg  12.5 mg Oral BID    acetaminophen (TYLENOL) tablet 650 mg  650 mg Oral Q6H PRN    Or    acetaminophen (TYLENOL) suppository 650 mg  650 mg Rectal Q6H PRN    aluminum & magnesium hydroxide-simethicone (MAALOX) 200-200-20 MG/5ML suspension 30 mL  30 mL Oral Q6H PRN            Objective:         BP 121/61   Pulse 65   Temp 98.7 F (37.1 C) (Axillary)   Resp 20   Wt 72 kg (158 lb 11.7 oz)   SpO2  95%   BMI 22.14 kg/m         Temp (24hrs), Avg:99.3 F (37.4 C), Min:98.5 F (36.9 C), Max:100.4 F (38 C)        Intake/Output Summary (Last 24 hours) at 01/02/2023 1559  Last data filed at 01/02/2023 1508  Gross per 24 hour   Intake 720 ml   Output 1800 ml   Net -1080 ml        Physical Exam  General Appearance:    No acute distress, AO1   Head:    Normocephalic, atraumatic   Eyes:    Vision grossly normal    Neck:   Supple   Back:     No deformity   Lungs:     Non labored breathing   Chest wall:    No deformity   Heart:    S1 and S2 normal, aortic systolic murmur 5/6   Abdomen:     No significant tenderness   Extremities:   Extremities without focal deformity   Skin:   Warm, dry   Neurologic:   No focal motor or sensory deficits   Psychiatric:  Cooperative      Additional comments:I reviewed and interpreted patient's clinical lab test results.    Labs    Recent Results (from the past 24 hour(s))   POCT Glucose    Collection Time: 01/01/23  4:27 PM   Result Value Ref Range    POC Glucose 175 mg/dL    Performed by: Alfonso Ellis    Troponin    Collection Time: 01/01/23  7:21 PM   Result Value  Ref Range    Troponin, High Sensitivity 91 (HH) 0.00 - 20.00 pg/mL   Lactate, Sepsis    Collection Time: 01/01/23  7:21 PM   Result Value Ref Range    Lactic Acid, Sepsis 1.4 0.5 - 2.0 MMOL/L   APTT    Collection Time: 01/01/23  9:24 PM   Result Value Ref Range    APTT 97.0 (HH) 22.5 - 33.0 SEC   POCT Glucose    Collection Time: 01/01/23  9:41 PM   Result Value Ref Range    POC Glucose 198 mg/dL    Performed by: Lorn Junes    Occult Blood, Fecal    Collection Time: 01/01/23 11:56 PM   Result Value Ref Range    POC Occult Blood, Fecal Positive (A) Negative   Hemoglobin and Hematocrit    Collection Time: 01/02/23  2:20 AM   Result Value Ref Range    Hemoglobin 14.4 14.0 - 18.0 g/dL    Hematocrit 42.0 42.0 - 52.0 %   Renal Function Panel    Collection Time: 01/02/23  2:20 AM   Result Value Ref Range    Sodium 140 135 - 145 mmol/L    Potassium 2.5 (LL) 3.5 - 5.0 mmol/L    Chloride 106 100 - 110 mmol/L    CO2 20 20 - 32 mmol/L    Anion Gap 17 mmol/L    Glucose 213 (H) 75 - 110 mg/dL    BUN 22 (H) 7 - 20 MG/DL    Creatinine 1.04 0.40 - 1.20 MG/DL    Bun/Cre Ratio 21     Est, Glom Filt Rate >60 ml/min/1.59m    Calcium 9.0 8.8 - 10.5 MG/DL    Phosphorus 3.5 2.4 - 4.7 MG/DL    Albumin 3.6 3.5 - 5.0 g/dL   CBC    Collection Time: 01/02/23  5:09 AM  Result Value Ref Range    WBC 18.3 (H) 4.8 - 10.8 K/uL    RBC 4.79 4.50 - 6.00 M/uL    Hemoglobin 14.6 14.0 - 18.0 g/dL    Hematocrit 43.3 42.0 - 52.0 %    MCV 90.4 80.0 - 100.0 FL    MCH 30.5 28.0 - 34.0 PG    MCHC 33.7 32.0 - 36.0 g/dL    RDW 12.9 11.5 - 13.5 %    Platelets 179 150 - 400 K/uL    MPV 11.1 7.0 - 12.0 FL   Basic Metabolic Panel w/ Reflex to MG    Collection Time: 01/02/23  5:09 AM   Result Value Ref Range    Sodium 134 (L) 135 - 145 mmol/L    Potassium 2.9 (LL) 3.5 - 5.0 mmol/L    Chloride 104 100 - 110 mmol/L    CO2 20 20 - 32 mmol/L    Anion Gap 13 mmol/L    Glucose 226 (H) 75 - 110 mg/dL    BUN 24 (H) 7 - 20 MG/DL    Creatinine 1.02 0.40 - 1.20  MG/DL    Bun/Cre Ratio 24     Est, Glom Filt Rate >60 ml/min/1.70m    Calcium 8.7 (L) 8.8 - 10.5 MG/DL   Hepatic Function Panel    Collection Time: 01/02/23  5:09 AM   Result Value Ref Range    Albumin 3.6 3.5 - 5.0 g/dL    Alk Phosphatase 71 35 - 100 U/L    ALT 19 3 - 35 U/L    AST 30 15 - 40 U/L    Bilirubin, Direct 0.20 0.00 - 0.20 mg/dL    Total Bilirubin 1.10 0.10 - 1.20 mg/dL    Total Protein 7.7 6.2 - 8.0 g/dL    Globulin 4.1 g/dL    Albumin/Globulin Ratio 0.9     Phosphorus    Collection Time: 01/02/23  5:09 AM   Result Value Ref Range    Phosphorus 3.8 2.4 - 4.7 MG/DL   Magnesium    Collection Time: 01/02/23  5:09 AM   Result Value Ref Range    Magnesium 1.9 1.7 - 2.5 mg/dL   Lipid Panel Reflex to Direct LDL    Collection Time: 01/02/23  5:09 AM   Result Value Ref Range    Cholesterol 134 115 - 200 MG/DL    Triglycerides 71 30 - 150 MG/DL    HDL 47 40 - 60 MG/DL    LDL Cholesterol 72.8 65 - 130 MG/DL    Non-HDL Cholesterol 87 mg/dL   APTT    Collection Time: 01/02/23  5:09 AM   Result Value Ref Range    APTT >135.0 (HH) 22.5 - 33.0 SEC   POCT Glucose    Collection Time: 01/02/23  8:04 AM   Result Value Ref Range    POC Glucose 261 mg/dL    Performed by: GAdair Laundry   POCT Glucose    Collection Time: 01/02/23 12:01 PM   Result Value Ref Range    POC Glucose 278 mg/dL    Performed by: GAdair Laundry   APTT    Collection Time: 01/02/23  2:11 PM   Result Value Ref Range    APTT 70.8 (HH) 22.5 - 33.0 SEC   CBC    Collection Time: 01/02/23  2:11 PM   Result Value Ref Range    WBC 9.1 4.8 - 10.8 K/uL    RBC 4.52 4.50 - 6.00  M/uL    Hemoglobin 13.6 (L) 14.0 - 18.0 g/dL    Hematocrit 41.7 (L) 42.0 - 52.0 %    MCV 92.3 80.0 - 100.0 FL    MCH 30.1 28.0 - 34.0 PG    MCHC 32.6 32.0 - 36.0 g/dL    RDW 13.2 11.5 - 13.5 %    Platelets 167 150 - 400 K/uL    MPV 10.9 7.0 - 12.0 FL       XR CHEST (2 VW)    Result Date: 01/01/2023  EXAM: RADIOGRAPHS OF THE CHEST INDICATION: ELEVATED WBC AND LACTATE - NO OBVIOUS SOURCE -  PATIENT IS DEMENTED.; . COMPARISON: Mar 10, 2022 TECHNIQUE: Region of study: Chest.  Radiograph, 2 views. FINDINGS: There is moderate increase of the interstitial infiltrates in the right lung. Cardiomediastinal silhouette within normal limits.     Moderate increase of the interstitial infiltrates in the right lung suggestive of an overlapping acute interstitial process including atypical pneumonia or edema.     CT ABDOMEN PELVIS W IV CONTRAST Additional Contrast? None    Result Date: 01/01/2023  INTERPRETING PROVIDER:  Paulita Cradle MD EXAMINATION: CT ABDOMEN/PELVIS W/ CONTRAST DATE OF EXAM: 01/01/2023 1:56 AM CLINICAL INFORMATION: Male, 82 years old. Nausea, vomiting, leukocytosis, evaluate for acute intra-abdominal process TECHNIQUE: Axial CT images of the abdomen and pelvis were obtained after IV contrast administration. One or more of these dose optimization techniques were utilized: Automated exposure control; mA and/or kV adjustment per patient size (includes targeted exams where dose is matched to clinical indication); or iterative reconstruction. COMPARISON: Chest 08/06/2020 FINDINGS: Lung bases: Continued groundglass densities in the lung bases likely related to pulmonary fibrosis. Peritoneum/Retroperitoneum: No free air or free fluid. Vessels: Vascular calcifications. Liver: Unremarkable parenchyma. Gallbladder: Surgical clips mark site of prior cholecystectomy. Biliary: No significant biliary dilation. Spleen: Unremarkable. Pancreas: Unremarkable parenchyma. Adrenal glands: Unremarkable. Kidneys: Nonobstructive renal calculi identified.  No hydronephrosis or hydroureter. Lower GU: Unremarkable bladder. Foregut: Stomach, esophagus, and duodenum are unremarkable. Mid/Hindgut: The intestinal tract is unremarkable. No evidence of obstruction. Appendix is unremarkable. Lymph nodes: No intra-abdominal or retroperitoneal enlarged lymph nodes. No pelvic or inguinal lymphadenopathy. Soft tissues and Body wall:  Fat-containing umbilical hernia. Osseous structures: No fracture or dislocation. Mild degenerative changes. No suspicious lytic or sclerotic lesions. Additional comments: None.     No significant abnormality identified. THIS IS AN ELECTRONICALLY VERIFIED REPORT Dictated By:  Paulita Cradle MD Dictated:  01/01/2023 02:20:43 Transcribed By:  Self-Edited - PowerScribe Transcribed:  01/01/2023 02:20:43 Signed By:  Paulita Cradle MD Signed:  01/01/2023 02:24:41 Providers for questions regarding this report, Monday - Friday 8am-5pm call [facility] Support at [phone]. From 5pm-7am everyday call [facility] Reading Room at [phone]. WSN: [id number]       Assessment/Plan:     Principal Problem:    NSTEMI (non-ST elevated myocardial infarction) ([place])  Resolved Problems:    * No resolved hospital problems. *    78yM w Alzheimers presents for projectile vomiting, found with NSTEMI. On heparin drip with echo pending on Monday. GI to be consulted on Monday. TSH blood cultures, UA pending     Patient on decreased lantus pending evaluation of sugars on hospital diet.      Liquid stool likely related to gi bleeding, but will check c diff given watery    GI and cards pending     Plan:  NSTEMI  -cards consult appreciated, case discussed with Dr Hessie Dibble  -continue heparin drip, asa, bb, trend  trop  -echo Monday     Projectile Vomiting  -GI Consult Monday  -fu blood cultures, UA     Watery stool  -fu c. diff  -Possibly related to heparin drip    T1 DM  -patient takes Lantus 48u qd and 10u qhs. Decreased to 30 and 10 while inpatient. Assess on hospital diet and re increase to home dose if there is hyperglycemia  -sliding scale ordered  -Blood sugar achs     Alzheimers dementia  -aware  -continue namenda     HTN   HLD  Hypothyroid  -continue home meds     #DVT prophylaxis: hep drip  # Code status: full  # Disposition plan: Pending clinical course      This chart was dictated using M Modal, a voice to text  software. Attempts were made to edit but it may contain unrecognized errors. Please call the office with any questions or concerns.      Signed By: [name], MD     January 02, 2023

## 2023-01-02 NOTE — Progress Notes (Signed)
[id number] Bag 4/6 IV potassium replacement infusing at this time. Per protocol restart Heparin gtt at this time at 8u/kg/hr. Verified with Mickel Baas in pharmacy.

## 2023-01-02 NOTE — Progress Notes (Addendum)
Patient met alert and oriented to self, on bed rest, observed to be having increased work of breathing, respiratory rate of 32, O2 sat was 96%, he was placed on 3L of oxygen and it helped. Patient was having frequent watery black stool, provider made aware, labs was drawn and stool sample was also sent. He has heparin infusing and rate is adjusted per protocol. Rectal tube was placed to reduced skin irritation. Patient endorsed having chest pain, rated as 10 but could not describe how it feels, vitals was stable, EKG was done, Malox was given. On reassessment, patient denied having any pain.Ivf KCL started as his _ level is 2.5. Plan is to continue heparin though his stool analysis is positive for occult blood per provider order, GI consult and ECHO to be done on Monday.

## 2023-01-02 NOTE — Progress Notes (Signed)
Presumptive negative Cdiff results at this time.

## 2023-01-02 NOTE — Plan of Care (Signed)
Problem: ABCDS Injury Assessment  Goal: Absence of physical injury  Outcome: Progressing     Problem: Safety - Adult  Goal: Free from fall injury  Outcome: Progressing     Problem: Skin/Tissue Integrity  Goal: Absence of new skin breakdown  Description: 1.  Monitor for areas of redness and/or skin breakdown  2.  Assess vascular access sites hourly  3.  Every 4-6 hours minimum:  Change oxygen saturation probe site  4.  Every 4-6 hours:  If on nasal continuous positive airway pressure, respiratory therapy assess nares and determine need for appliance change or resting period.  Outcome: Progressing     Problem: Pain  Goal: Verbalizes/displays adequate comfort level or baseline comfort level  Outcome: Progressing

## 2023-01-02 NOTE — Progress Notes (Signed)
Patient alert and oriented to self only. Received in report from night nurse that patient was tachypneic and received oxygen to support respirations. On room air for this shift and tolerated. Male purewick device and qora remain in place for urinary incontinence and liquid fecal incontinence. Zofran and Tylenol given per MAR.    [id number]: PTT 70.8; therapeutic per protocol. Patient's first therapeutic. Verified with Mickel Baas in pharmacy maintain current rate of 8 units/kg/hr.    Potassium replacement finished today. Patient received 60 mEq by IV per protocol. Started on night shift and finished this shift. Potassium recheck 3.6.

## 2023-01-03 LAB — URINE MICROSCOPIC WITH REFLEX TO CULTURE
RBC, UA: NONE SEEN /hpf (ref <3)
WBC, UA: NONE SEEN /hpf (ref <6)

## 2023-01-03 LAB — CBC
Hematocrit: 41.4 % — ABNORMAL LOW (ref 42.0–52.0)
Hemoglobin: 13.9 g/dL — ABNORMAL LOW (ref 14.0–18.0)
MCH: 30.6 PG (ref 28.0–34.0)
MCHC: 33.6 g/dL (ref 32.0–36.0)
MCV: 91.2 FL (ref 80.0–100.0)
MPV: 11.4 FL (ref 7.0–12.0)
Platelets: 162 10*3/uL (ref 150–400)
RBC: 4.54 M/uL (ref 4.50–6.00)
RDW: 13 % (ref 11.5–13.5)
WBC: 10.3 10*3/uL (ref 4.8–10.8)

## 2023-01-03 LAB — URINALYSIS WITH REFLEX TO CULTURE
Bilirubin Urine: NEGATIVE
Blood, Urine: NEGATIVE
Glucose, Ur: 100 mg/dL — AB
Ketones, Urine: NEGATIVE mg/dL
Leukocyte Esterase, Urine: NEGATIVE
Nitrite, Urine: NEGATIVE
Protein, Urine: 30 mg/dL — AB
Specific Gravity, UA: >=1.03 (ref 1.005–1.030)
Urobilinogen, Urine: 0.2 EU/dL (ref 0.1–1.0)
pH, Urine: 6 (ref 5.0–9.0)

## 2023-01-03 LAB — BASIC METABOLIC PANEL W/ REFLEX TO MG FOR LOW K
Anion Gap: 16 mmol/L
BUN: 37 MG/DL — ABNORMAL HIGH (ref 7–20)
Bun/Cre Ratio: 30
CO2: 19 mmol/L — ABNORMAL LOW (ref 20–32)
Calcium: 8.5 MG/DL — ABNORMAL LOW (ref 8.8–10.5)
Chloride: 102 mmol/L (ref 100–110)
Creatinine: 1.22 MG/DL — ABNORMAL HIGH (ref 0.40–1.20)
Est, Glom Filt Rate: 60 mL/min/{1.73_m2}
Glucose: 286 mg/dL — ABNORMAL HIGH (ref 75–110)
Potassium: 3.2 mmol/L — ABNORMAL LOW (ref 3.5–5.0)
Sodium: 134 mmol/L — ABNORMAL LOW (ref 135–145)

## 2023-01-03 LAB — TROPONIN
Troponin, High Sensitivity: 38 pg/mL (ref 0.00–20.00)
Troponin, High Sensitivity: 41 pg/mL (ref 0.00–20.00)

## 2023-01-03 LAB — POCT GLUCOSE
POC Glucose: 234 mg/dL
POC Glucose: 271 mg/dL
POC Glucose: 167 mg/dL

## 2023-01-03 LAB — POTASSIUM: Potassium: 3.6 mmol/L (ref 3.5–5.0)

## 2023-01-03 LAB — MAGNESIUM: Magnesium: 2.1 mg/dL (ref 1.7–2.5)

## 2023-01-03 LAB — APTT
APTT: 65.2 s (ref 22.5–33.0)
APTT: 61.4 s (ref 22.5–33.0)
APTT: 70.4 s (ref 22.5–33.0)

## 2023-01-03 MED ORDER — PERFLUTREN LIPID MICROSPHERE IV SUSP
Freq: Once | INTRAVENOUS | Status: AC | PRN
Start: 2023-01-03 — End: 2023-01-03
  Administered 2023-01-03: 14:00:00 1 mL via INTRAVENOUS

## 2023-01-03 MED FILL — LISINOPRIL 10 MG PO TABS: 10 MG | ORAL | Qty: 1

## 2023-01-03 MED FILL — QUETIAPINE FUMARATE 25 MG PO TABS: 25 MG | ORAL | Qty: 2

## 2023-01-03 MED FILL — TAMSULOSIN HCL 0.4 MG PO CAPS: 0.4 MG | ORAL | Qty: 1

## 2023-01-03 MED FILL — ONDANSETRON HCL 4 MG/2ML IJ SOLN: 4 MG/2ML | INTRAMUSCULAR | Qty: 2

## 2023-01-03 MED FILL — KLOR-CON M20 20 MEQ PO TBCR: 20 MEQ | ORAL | Qty: 2

## 2023-01-03 MED FILL — ONDANSETRON 4 MG PO TBDP: 4 MG | ORAL | Qty: 1

## 2023-01-03 MED FILL — METOPROLOL TARTRATE 25 MG PO TABS: 25 MG | ORAL | Qty: 1

## 2023-01-03 MED FILL — AMLODIPINE BESYLATE 5 MG PO TABS: 5 MG | ORAL | Qty: 2

## 2023-01-03 MED FILL — ACETAMINOPHEN 325 MG PO TABS: 325 MG | ORAL | Qty: 2

## 2023-01-03 MED FILL — LEVOTHYROXINE SODIUM 50 MCG PO TABS: 50 MCG | ORAL | Qty: 1

## 2023-01-03 MED FILL — PANTOPRAZOLE SODIUM 40 MG PO TBEC: 40 MG | ORAL | Qty: 1

## 2023-01-03 MED FILL — HEPARIN SOD (PORCINE) IN D5W 100 UNIT/ML IV SOLN: 100 UNIT/ML | INTRAVENOUS | Qty: 250

## 2023-01-03 MED FILL — DONEPEZIL HCL 5 MG PO TABS: 5 MG | ORAL | Qty: 1

## 2023-01-03 MED FILL — PERFLUTREN LIPID MICROSPHERE IV SUSP: INTRAVENOUS | Qty: 1

## 2023-01-03 MED FILL — ATORVASTATIN CALCIUM 40 MG PO TABS: 40 MG | ORAL | Qty: 2

## 2023-01-03 MED FILL — LORATADINE 10 MG PO TABS: 10 MG | ORAL | Qty: 1

## 2023-01-03 MED FILL — ASPIRIN LOW DOSE 81 MG PO CHEW: 81 MG | ORAL | Qty: 1

## 2023-01-03 MED FILL — ALUM & MAG HYDROXIDE-SIMETH 200-200-20 MG/5ML PO SUSP: 200-200-20 MG/5ML | ORAL | Qty: 30

## 2023-01-03 NOTE — Progress Notes (Addendum)
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 Known Dementia    Psychosocial: resides ALF at [facility] here in [place]: On a heparin gtt, rate/dose verified at start of shift and with charge RN Pat/Pharmacy. Most recent PTT at 1500 therapeutic at 70.4, next PTT scheduled for 2100.     Patient is sleeping most of the day and easily aroused, he is alert when awake and answers questions appropriately.     PRN Zofran being utilized for GI upset. Quora rectal tube being utilized for his constant loose stools.     He has not been oob today but does have good bed mobility, no skin issues.     Potassium 3.2 in am labs and has been replaced per protocol.     Patient unable to void on his own, he has been bladder scanned and straight cath in the last 24 hours. Foley placed for urinary retention.     Plan: Echo completed today with results pending, GI consult completed. Await Cardiology consult.     ~ Alejandro Macadamia, RN, BSN

## 2023-01-03 NOTE — Care Coordination-Inpatient (Signed)
Initial screening of patient chart done.    Patient admitted on 2/24 with a diagnosis of NSTEMI and is expected to discharge in one day. Patient has a past medical history of DM, Alzheimer's Disease, A-Fib and a Psychologist, forensic. Patient currently resides at [facility] care Unit.     Patient will be reviewed in MDR. Will continue to monitor for needs from case management.

## 2023-01-03 NOTE — Care Coordination-Inpatient (Signed)
Daily Care Management Note:    Per MDR, patient to have a GI and Cardiology consult today. Patient's LBM 2/22. Therapy is not following.     This CTN will continue to follow for future CM needs.

## 2023-01-03 NOTE — Progress Notes (Signed)
Pt slept majority of shift, remains oriented to self only, heparin therapeutic at 8u/kg/hr. Little urinary output overnight so bladder scan was performed at approx 0500 showing 564m. Straight cath performed resulting in 7551m Rectal tube remains in place draining watery stool.

## 2023-01-03 NOTE — Progress Notes (Signed)
Hospitalist Progress Note      Daily Progress Note: 01/03/2023 6:35 PM    Subjective:     Patient was seen and examined . GI apprec, cards pending    Trop peaked at 91 and trended down        Current Facility-Administered Medications   Medication Dose Route Frequency    amLODIPine (NORVASC) tablet 10 mg  10 mg Oral Daily    insulin glargine (LANTUS;BASAGLAR) injection pen 30 Units  30 Units SubCUTAneous Daily    levothyroxine (SYNTHROID) tablet 50 mcg  50 mcg Oral QAM AC    pantoprazole (PROTONIX) tablet 40 mg  40 mg Oral Daily    QUEtiapine (SEROQUEL) tablet 50 mg  50 mg Oral BID    loratadine (CLARITIN) tablet 10 mg  10 mg Oral Daily    tamsulosin (FLOMAX) capsule 0.4 mg  0.4 mg Oral Daily    insulin glargine (LANTUS;BASAGLAR) injection pen 10 Units  10 Units SubCUTAneous Nightly    donepezil (ARICEPT) tablet 5 mg  5 mg Oral Nightly    lisinopril (PRINIVIL;ZESTRIL) tablet 10 mg  10 mg Oral Daily    insulin lispro (1 Unit Dial) (HUMALOG/ADMELOG) pen 0-8 Units  0-8 Units SubCUTAneous TID WC    insulin lispro (1 Unit Dial) (HUMALOG/ADMELOG) pen 0-4 Units  0-4 Units SubCUTAneous Nightly    heparin (porcine) injection 2,000 Units  2,000 Units IntraVENous PRN    heparin 25,000 units in dextrose 5% 250 mL (premix) infusion  5-30 Units/kg/hr IntraVENous Continuous    sodium chloride flush 0.9 % injection 5-40 mL  5-40 mL IntraVENous 2 times per day    sodium chloride flush 0.9 % injection 5-40 mL  5-40 mL IntraVENous PRN    0.9 % sodium chloride infusion   IntraVENous PRN    potassium chloride (KLOR-CON M) extended release tablet 40 mEq  40 mEq Oral PRN    Or    potassium bicarb-citric acid (EFFER-K) effervescent tablet 40 mEq  40 mEq Oral PRN    Or    potassium chloride 10 mEq/100 mL IVPB (Peripheral Line)  10 mEq IntraVENous PRN    magnesium sulfate 2000 mg in 50 mL IVPB premix  2,000 mg IntraVENous PRN    ondansetron (ZOFRAN-ODT) disintegrating tablet 4 mg  4 mg Oral Q8H PRN    Or    ondansetron (ZOFRAN) injection 4 mg   4 mg IntraVENous Q6H PRN    polyethylene glycol (GLYCOLAX) packet 17 g  17 g Oral Daily PRN    aspirin chewable tablet 81 mg  81 mg Oral Daily    atorvastatin (LIPITOR) tablet 80 mg  80 mg Oral Nightly    metoprolol tartrate (LOPRESSOR) tablet 12.5 mg  12.5 mg Oral BID    acetaminophen (TYLENOL) tablet 650 mg  650 mg Oral Q6H PRN    Or    acetaminophen (TYLENOL) suppository 650 mg  650 mg Rectal Q6H PRN    aluminum & magnesium hydroxide-simethicone (MAALOX) 200-200-20 MG/5ML suspension 30 mL  30 mL Oral Q6H PRN            Objective:         BP 110/63   Pulse 65   Temp 98 F (36.7 C) (Oral)   Resp 18   Wt 72.1 kg (158 lb 15.2 oz)   SpO2 96%   BMI 22.17 kg/m         Temp (24hrs), Avg:98.3 F (36.8 C), Min:97.8 F (36.6 C), Max:98.8 F (37.1 C)  Intake/Output Summary (Last 24 hours) at 01/03/2023 1835  Last data filed at 01/03/2023 1230  Gross per 24 hour   Intake 355 ml   Output 825 ml   Net -470 ml          Physical Exam  General Appearance:    No acute distress, AO1   Head:    Normocephalic, atraumatic   Eyes:    Vision grossly normal    Neck:   Supple   Back:     No deformity   Lungs:     Non labored breathing   Chest wall:    No deformity   Heart:    S1 and S2 normal, aortic systolic murmur 5/6   Abdomen:     No significant tenderness   Extremities:   Extremities without focal deformity   Skin:   Warm, dry   Neurologic:   No focal motor or sensory deficits   Psychiatric:  Cooperative      Additional comments:I reviewed and interpreted patient's clinical lab test results.    Labs    Recent Results (from the past 24 hour(s))   APTT    Collection Time: 01/02/23  7:53 PM   Result Value Ref Range    APTT 65.2 (HH) 22.5 - 33.0 SEC   POCT Glucose    Collection Time: 01/02/23  9:21 PM   Result Value Ref Range    POC Glucose 234 mg/dL    Performed by: Harlan Stains    Urinalysis with Reflex to Culture    Collection Time: 01/03/23  6:30 AM    Specimen: Urine   Result Value Ref Range    Color, UA YELLOW YELLOW     Appearance CLEAR CLEAR    Specific Gravity, UA >=1.030 1.005 - 1.030    pH, Urine 6.0 5.0 - 9.0    Protein, Urine 30 (A) Negative mg/dL    Glucose, Ur 100 (A) Negative mg/dL    Ketones, Urine Negative Negative mg/dL    Bilirubin Urine Negative Negative    Blood, Urine Negative Negative    Urobilinogen, Urine 0.2 0.1 - 1.0 EU/dL    Nitrite, Urine Negative Negative    Leukocyte Esterase, Urine Negative Negative   Urine Microscopic with Reflex to Culture    Collection Time: 01/03/23  6:30 AM   Result Value Ref Range    WBC, UA NONE SEEN <6 /hpf    RBC, UA NONE SEEN <3 /hpf    Epithelial Casts, UA SQUAMOUS EPITHELIAL CELLS  FEW   /lpf    Comment: CULTURE NOT INDICATED BY UA RESULT     Mucus, UA FEW /lpf    Casts HYALINE  0-3   /lpf   CBC    Collection Time: 01/03/23  7:06 AM   Result Value Ref Range    WBC 10.3 4.8 - 10.8 K/uL    RBC 4.54 4.50 - 6.00 M/uL    Hemoglobin 13.9 (L) 14.0 - 18.0 g/dL    Hematocrit 41.4 (L) 42.0 - 52.0 %    MCV 91.2 80.0 - 100.0 FL    MCH 30.6 28.0 - 34.0 PG    MCHC 33.6 32.0 - 36.0 g/dL    RDW 13.0 11.5 - 13.5 %    Platelets 162 150 - 400 K/uL    MPV 11.4 7.0 - 12.0 FL   Basic Metabolic Panel w/ Reflex to MG    Collection Time: 01/03/23  7:06 AM   Result Value Ref Range  Sodium 134 (L) 135 - 145 mmol/L    Potassium 3.2 (L) 3.5 - 5.0 mmol/L    Chloride 102 100 - 110 mmol/L    CO2 19 (L) 20 - 32 mmol/L    Anion Gap 16 mmol/L    Glucose 286 (H) 75 - 110 mg/dL    BUN 37 (H) 7 - 20 MG/DL    Creatinine 1.22 (H) 0.40 - 1.20 MG/DL    Bun/Cre Ratio 30     Est, Glom Filt Rate 60 ml/min/1.16m    Calcium 8.5 (L) 8.8 - 10.5 MG/DL   APTT    Collection Time: 01/03/23  7:06 AM   Result Value Ref Range    APTT 61.4 (HH) 22.5 - 33.0 SEC   Magnesium    Collection Time: 01/03/23  7:06 AM   Result Value Ref Range    Magnesium 2.1 1.7 - 2.5 mg/dL   POCT Glucose    Collection Time: 01/03/23  7:18 AM   Result Value Ref Range    POC Glucose 271 mg/dL    Performed by: HRosalita Chessman   Troponin    Collection  Time: 01/03/23  8:44 AM   Result Value Ref Range    Troponin, High Sensitivity 38 (HH) 0.00 - 20.00 pg/mL   Troponin    Collection Time: 01/03/23 10:11 AM   Result Value Ref Range    Troponin, High Sensitivity 41 (HH) 0.00 - 20.00 pg/mL   Potassium    Collection Time: 01/03/23  3:08 PM   Result Value Ref Range    Potassium 3.6 3.5 - 5.0 mmol/L   APTT    Collection Time: 01/03/23  3:08 PM   Result Value Ref Range    APTT 70.4 (HH) 22.5 - 33.0 SEC   POCT Glucose    Collection Time: 01/03/23  5:04 PM   Result Value Ref Range    POC Glucose 167 mg/dL    Performed by: HKy BarbanGlenda        XR CHEST (2 VW)    Result Date: 01/01/2023  EXAM: RADIOGRAPHS OF THE CHEST INDICATION: ELEVATED WBC AND LACTATE - NO OBVIOUS SOURCE - PATIENT IS DEMENTED.; . COMPARISON: Mar 10, 2022 TECHNIQUE: Region of study: Chest.  Radiograph, 2 views. FINDINGS: There is moderate increase of the interstitial infiltrates in the right lung. Cardiomediastinal silhouette within normal limits.     Moderate increase of the interstitial infiltrates in the right lung suggestive of an overlapping acute interstitial process including atypical pneumonia or edema.     CT ABDOMEN PELVIS W IV CONTRAST Additional Contrast? None    Result Date: 01/01/2023  INTERPRETING PROVIDER:  BPaulita CradleMD EXAMINATION: CT ABDOMEN/PELVIS W/ CONTRAST DATE OF EXAM: 01/01/2023 1:56 AM CLINICAL INFORMATION: Male, 82years old. Nausea, vomiting, leukocytosis, evaluate for acute intra-abdominal process TECHNIQUE: Axial CT images of the abdomen and pelvis were obtained after IV contrast administration. One or more of these dose optimization techniques were utilized: Automated exposure control; mA and/or kV adjustment per patient size (includes targeted exams where dose is matched to clinical indication); or iterative reconstruction. COMPARISON: Chest 08/06/2020 FINDINGS: Lung bases: Continued groundglass densities in the lung bases likely related to pulmonary fibrosis.  Peritoneum/Retroperitoneum: No free air or free fluid. Vessels: Vascular calcifications. Liver: Unremarkable parenchyma. Gallbladder: Surgical clips mark site of prior cholecystectomy. Biliary: No significant biliary dilation. Spleen: Unremarkable. Pancreas: Unremarkable parenchyma. Adrenal glands: Unremarkable. Kidneys: Nonobstructive renal calculi identified.  No hydronephrosis or hydroureter. Lower GU: Unremarkable bladder. Foregut: Stomach, esophagus,  and duodenum are unremarkable. Mid/Hindgut: The intestinal tract is unremarkable. No evidence of obstruction. Appendix is unremarkable. Lymph nodes: No intra-abdominal or retroperitoneal enlarged lymph nodes. No pelvic or inguinal lymphadenopathy. Soft tissues and Body wall: Fat-containing umbilical hernia. Osseous structures: No fracture or dislocation. Mild degenerative changes. No suspicious lytic or sclerotic lesions. Additional comments: None.     No significant abnormality identified. THIS IS AN ELECTRONICALLY VERIFIED REPORT Dictated By:  Paulita Cradle MD Dictated:  01/01/2023 02:20:43 Transcribed By:  Self-Edited - PowerScribe Transcribed:  01/01/2023 02:20:43 Signed By:  Paulita Cradle MD Signed:  01/01/2023 02:24:41 Providers for questions regarding this report, Monday - Friday 8am-5pm call [facility] Support at [phone]. From 5pm-7am everyday call [facility] Reading Room at [phone]. WSN: [id number]       Assessment/Plan:     Principal Problem:    NSTEMI (non-ST elevated myocardial infarction) ([place])  Resolved Problems:    * No resolved hospital problems. *    82yM w Alzheimers presents for projectile vomiting, found with NSTEMIw trop peak 91, on heparin drip with cards pending. Echo shows EF 69% no wma or valvular disease     Patient on decreased lantus due to hospital diet.      Liquid stool likely related to gi bleeding, cdiff negative, guaic positive likelly related to heparin. GI aware, no intervention, recs  appreciated.     Cards input pending     Plan:  NSTEMI  -cards consult appreciated, recs pending  -continue heparin drip, asa, bb  -trop peaked at 91, echo w no wma, 69%      Projectile Vomiting  -GI Consult apprec, no intervention     Watery stool  -cdiff negative    T1 DM  -patient takes Lantus 48u qd and 10u qhs. Decreased to 30 and 10 while inpatient. Assess on hospital diet and re increase to home dose if there is hyperglycemia  -sliding scale ordered  -Blood sugar achs     Alzheimers dementia  -aware  -continue namenda     HTN   HLD  Hypothyroid  -continue home meds     #DVT prophylaxis: hep drip  # Code status: full    Signed By: [name], MD     January 03, 2023

## 2023-01-03 NOTE — Consults (Signed)
Gastroenterology Consult     Referring Physician: Dr. Daniel Nones    Consult Date: 01/03/2023     Subjective:     Chief Complaint: Vomiting     History of Present Illness: Alejandro [name] is a 82 y.o. male who is seen in consultation for vomiting. Pt with PMH T1 DM, CVA w L hemiplegia, Alzheimer's dementia, HLD HTN, Hypothyroid, Afib on eliquis who presented to ED from nursing home 2/24 with vomiting complicated by non ST-elevation MI. patient has remained stable since admission.  He is tolerating liquid diet.    Past Medical History:   Diagnosis Date    Dementia ([place])     Diabetes ([place])     Grief     loss of wife 2014    Hypertension     Presence of cardiac pacemaker        No past surgical history on file.     Allergies   Allergen Reactions    Shellfish Allergy Anaphylaxis       Social History     Tobacco Use    Smoking status: Never    Smokeless tobacco: Never   Substance Use Topics    Alcohol use: Never        No family history on file.    Current Facility-Administered Medications   Medication Dose Route Frequency    amLODIPine (NORVASC) tablet 10 mg  10 mg Oral Daily    insulin glargine (LANTUS;BASAGLAR) injection pen 30 Units  30 Units SubCUTAneous Daily    levothyroxine (SYNTHROID) tablet 50 mcg  50 mcg Oral QAM AC    pantoprazole (PROTONIX) tablet 40 mg  40 mg Oral Daily    QUEtiapine (SEROQUEL) tablet 50 mg  50 mg Oral BID    loratadine (CLARITIN) tablet 10 mg  10 mg Oral Daily    tamsulosin (FLOMAX) capsule 0.4 mg  0.4 mg Oral Daily    insulin glargine (LANTUS;BASAGLAR) injection pen 10 Units  10 Units SubCUTAneous Nightly    donepezil (ARICEPT) tablet 5 mg  5 mg Oral Nightly    lisinopril (PRINIVIL;ZESTRIL) tablet 10 mg  10 mg Oral Daily    insulin lispro (1 Unit Dial) (HUMALOG/ADMELOG) pen 0-8 Units  0-8 Units SubCUTAneous TID WC    insulin lispro (1 Unit Dial) (HUMALOG/ADMELOG) pen 0-4 Units  0-4 Units SubCUTAneous Nightly    heparin (porcine) injection 2,000 Units  2,000 Units IntraVENous PRN    heparin  25,000 units in dextrose 5% 250 mL (premix) infusion  5-30 Units/kg/hr IntraVENous Continuous    sodium chloride flush 0.9 % injection 5-40 mL  5-40 mL IntraVENous 2 times per day    sodium chloride flush 0.9 % injection 5-40 mL  5-40 mL IntraVENous PRN    0.9 % sodium chloride infusion   IntraVENous PRN    potassium chloride (KLOR-CON M) extended release tablet 40 mEq  40 mEq Oral PRN    Or    potassium bicarb-citric acid (EFFER-K) effervescent tablet 40 mEq  40 mEq Oral PRN    Or    potassium chloride 10 mEq/100 mL IVPB (Peripheral Line)  10 mEq IntraVENous PRN    magnesium sulfate 2000 mg in 50 mL IVPB premix  2,000 mg IntraVENous PRN    ondansetron (ZOFRAN-ODT) disintegrating tablet 4 mg  4 mg Oral Q8H PRN    Or    ondansetron (ZOFRAN) injection 4 mg  4 mg IntraVENous Q6H PRN    polyethylene glycol (GLYCOLAX) packet 17 g  17 g Oral Daily PRN  aspirin chewable tablet 81 mg  81 mg Oral Daily    atorvastatin (LIPITOR) tablet 80 mg  80 mg Oral Nightly    metoprolol tartrate (LOPRESSOR) tablet 12.5 mg  12.5 mg Oral BID    acetaminophen (TYLENOL) tablet 650 mg  650 mg Oral Q6H PRN    Or    acetaminophen (TYLENOL) suppository 650 mg  650 mg Rectal Q6H PRN    aluminum & magnesium hydroxide-simethicone (MAALOX) 200-200-20 MG/5ML suspension 30 mL  30 mL Oral Q6H PRN        Review of Systems:  Review of systems is obtained with pertinent positives as listed in the History of Present Illness and Past Medical History. All others are negative.    Objective:     Physical Exam:  BP 110/63   Pulse 65   Temp 98 F (36.7 C) (Oral)   Resp 18   Wt 72.1 kg (158 lb 15.2 oz)   SpO2 96%   BMI 22.17 kg/m      Skin:  Extremities and face reveal no rashes. No palmer erythema. No telangiectasias on the chest wall   HEENT: Sclerae anicteric. Extra-occular muscles are intact. No oral ulcers.  No abnormal pigmentation of the lips. The neck is supple.  Cardiovascular: Regular rate and rhythm. No murmurs, gallops, or rubs. PMI  nondisplaced. Carotids without bruits.  Respiratory:  Comfortable breathing with no accessory muscle use. Clear breath sounds with no wheezes, rales, or rhonchi.  GI:  Abdomen nondistended, soft, and nontender.  Normal active bowel sounds. No enlargement of the liver or spleen. No masses palpable.  Rectal:  Deferred  Musculoskeletal:  No pitting edema of the lower legs. Extremities have good range of motion.  No costovertebral tenderness.  Neurological:   Patient is A&Ox1.  Psychiatric:  Mood appears appropriate with judgement intact.  Lymphatic:  No cervical or supraclavicular adenopathy.    Lab/Data Review:  All lab results for the last 24 hours reviewed.      Assessment:     82 year old male with acute nausea and vomiting likely viral related.  Likely cause of NSTEMI. Symptoms have improved and hemoglobin stable.  No indication for endoscopy at this time.  Principal Problem:    NSTEMI (non-ST elevated myocardial infarction) ([place])  Resolved Problems:    * No resolved hospital problems. *       Plan:     Advance diet  Will sign off    A copy of this note was sent back to the requesting clinician

## 2023-01-03 NOTE — Plan of Care (Signed)
Problem: ABCDS Injury Assessment  Goal: Absence of physical injury  01/03/2023 1027 by Lovie Macadamia, RN  Outcome: Progressing  01/03/2023 [phone] by Mayer Camel, RN  Outcome: Progressing     Problem: Safety - Adult  Goal: Free from fall injury  01/03/2023 1027 by Lovie Macadamia, RN  Outcome: Progressing  01/03/2023 [id number] by Mayer Camel, RN  Outcome: Progressing     Problem: Skin/Tissue Integrity  Goal: Absence of new skin breakdown  Description: 1.  Monitor for areas of redness and/or skin breakdown  2.  Assess vascular access sites hourly  3.  Every 4-6 hours minimum:  Change oxygen saturation probe site  4.  Every 4-6 hours:  If on nasal continuous positive airway pressure, respiratory therapy assess nares and determine need for appliance change or resting period.  01/03/2023 1027 by Lovie Macadamia, RN  Outcome: Progressing  01/03/2023 [id number] by Mayer Camel, RN  Outcome: Progressing     Problem: Pain  Goal: Verbalizes/displays adequate comfort level or baseline comfort level  01/03/2023 1027 by Lovie Macadamia, RN  Outcome: Progressing  01/03/2023 [id number] by Mayer Camel, RN  Outcome: Progressing     Problem: Cardiovascular - Adult  Goal: Maintains optimal cardiac output and hemodynamic stability  Outcome: Progressing  Goal: Absence of cardiac dysrhythmias or at baseline  Outcome: Progressing     Problem: Gastrointestinal - Adult  Goal: Minimal or absence of nausea and vomiting  Outcome: Progressing  Goal: Maintains or returns to baseline bowel function  Outcome: Progressing  Goal: Maintains adequate nutritional intake  Outcome: Progressing

## 2023-01-03 NOTE — Plan of Care (Signed)
Problem: ABCDS Injury Assessment  Goal: Absence of physical injury  Outcome: Progressing     Problem: Safety - Adult  Goal: Free from fall injury  Outcome: Progressing     Problem: Skin/Tissue Integrity  Goal: Absence of new skin breakdown  Description: 1.  Monitor for areas of redness and/or skin breakdown  2.  Assess vascular access sites hourly  3.  Every 4-6 hours minimum:  Change oxygen saturation probe site  4.  Every 4-6 hours:  If on nasal continuous positive airway pressure, respiratory therapy assess nares and determine need for appliance change or resting period.  Outcome: Progressing     Problem: Pain  Goal: Verbalizes/displays adequate comfort level or baseline comfort level  Outcome: Progressing  Flowsheets (Taken 01/02/2023 1951)  Verbalizes/displays adequate comfort level or baseline comfort level: Assess pain using appropriate pain scale

## 2023-01-04 LAB — BASIC METABOLIC PANEL W/ REFLEX TO MG FOR LOW K
Anion Gap: 13 mmol/L
BUN: 27 MG/DL — ABNORMAL HIGH (ref 7–20)
Bun/Cre Ratio: 26
CO2: 20 mmol/L (ref 20–32)
Calcium: 8.2 MG/DL — ABNORMAL LOW (ref 8.8–10.5)
Chloride: 105 mmol/L (ref 100–110)
Creatinine: 1.02 MG/DL (ref 0.40–1.20)
Est, Glom Filt Rate: >60 mL/min/{1.73_m2}
Glucose: 84 mg/dL (ref 75–110)
Potassium: 3.3 mmol/L — ABNORMAL LOW (ref 3.5–5.0)
Sodium: 135 mmol/L (ref 135–145)

## 2023-01-04 LAB — MAGNESIUM: Magnesium: 2.1 mg/dL (ref 1.7–2.5)

## 2023-01-04 LAB — POCT GLUCOSE
POC Glucose: 140 mg/dL
POC Glucose: 92 mg/dL
POC Glucose: 121 mg/dL
POC Glucose: 104 mg/dL

## 2023-01-04 LAB — APTT
APTT: 84.7 s (ref 22.5–33.0)
APTT: 63 s (ref 22.5–33.0)
APTT: 65.6 s (ref 22.5–33.0)

## 2023-01-04 LAB — POTASSIUM: Potassium: 3.6 mmol/L (ref 3.5–5.0)

## 2023-01-04 MED ORDER — MENTHOL-ZINC OXIDE 0.44-20.6 % EX OINT
0.44-20.6 % | Freq: Two times a day (BID) | CUTANEOUS | Status: AC | PRN
Start: 2023-01-04 — End: 2023-01-05
  Administered 2023-01-04: 22:00:00 1 via TOPICAL

## 2023-01-04 MED FILL — LISINOPRIL 10 MG PO TABS: 10 MG | ORAL | Qty: 1

## 2023-01-04 MED FILL — CALMOSEPTINE 0.44-20.6 % EX OINT: CUTANEOUS | Qty: 71

## 2023-01-04 MED FILL — LORATADINE 10 MG PO TABS: 10 MG | ORAL | Qty: 1

## 2023-01-04 MED FILL — PANTOPRAZOLE SODIUM 40 MG PO TBEC: 40 MG | ORAL | Qty: 1

## 2023-01-04 MED FILL — METOPROLOL TARTRATE 25 MG PO TABS: 25 MG | ORAL | Qty: 1

## 2023-01-04 MED FILL — ASPIRIN LOW DOSE 81 MG PO CHEW: 81 MG | ORAL | Qty: 1

## 2023-01-04 MED FILL — AMLODIPINE BESYLATE 5 MG PO TABS: 5 MG | ORAL | Qty: 2

## 2023-01-04 MED FILL — ATORVASTATIN CALCIUM 40 MG PO TABS: 40 MG | ORAL | Qty: 2

## 2023-01-04 MED FILL — DONEPEZIL HCL 5 MG PO TABS: 5 MG | ORAL | Qty: 1

## 2023-01-04 MED FILL — ONDANSETRON 4 MG PO TBDP: 4 MG | ORAL | Qty: 1

## 2023-01-04 MED FILL — QUETIAPINE FUMARATE 25 MG PO TABS: 25 MG | ORAL | Qty: 2

## 2023-01-04 MED FILL — TAMSULOSIN HCL 0.4 MG PO CAPS: 0.4 MG | ORAL | Qty: 1

## 2023-01-04 MED FILL — LEVOTHYROXINE SODIUM 50 MCG PO TABS: 50 MCG | ORAL | Qty: 1

## 2023-01-04 MED FILL — HEPARIN SOD (PORCINE) IN D5W 100 UNIT/ML IV SOLN: 100 UNIT/ML | INTRAVENOUS | Qty: 250

## 2023-01-04 NOTE — Progress Notes (Signed)
Hospitalist Progress Note      Daily Progress Note: 01/04/2023 5:58 PM    Subjective:     Patient was seen and examined . Cards deferred MIBI due to difficulty contacting POA      Current Facility-Administered Medications   Medication Dose Route Frequency    menthol-zinc oxide (CALMOSEPTINE) 0.44-20.6 % ointment   Topical BID PRN    amLODIPine (NORVASC) tablet 10 mg  10 mg Oral Daily    insulin glargine (LANTUS;BASAGLAR) injection pen 30 Units  30 Units SubCUTAneous Daily    levothyroxine (SYNTHROID) tablet 50 mcg  50 mcg Oral QAM AC    pantoprazole (PROTONIX) tablet 40 mg  40 mg Oral Daily    QUEtiapine (SEROQUEL) tablet 50 mg  50 mg Oral BID    loratadine (CLARITIN) tablet 10 mg  10 mg Oral Daily    tamsulosin (FLOMAX) capsule 0.4 mg  0.4 mg Oral Daily    insulin glargine (LANTUS;BASAGLAR) injection pen 10 Units  10 Units SubCUTAneous Nightly    donepezil (ARICEPT) tablet 5 mg  5 mg Oral Nightly    lisinopril (PRINIVIL;ZESTRIL) tablet 10 mg  10 mg Oral Daily    insulin lispro (1 Unit Dial) (HUMALOG/ADMELOG) pen 0-8 Units  0-8 Units SubCUTAneous TID WC    insulin lispro (1 Unit Dial) (HUMALOG/ADMELOG) pen 0-4 Units  0-4 Units SubCUTAneous Nightly    heparin (porcine) injection 2,000 Units  2,000 Units IntraVENous PRN    heparin 25,000 units in dextrose 5% 250 mL (premix) infusion  5-30 Units/kg/hr IntraVENous Continuous    sodium chloride flush 0.9 % injection 5-40 mL  5-40 mL IntraVENous 2 times per day    sodium chloride flush 0.9 % injection 5-40 mL  5-40 mL IntraVENous PRN    0.9 % sodium chloride infusion   IntraVENous PRN    potassium chloride (KLOR-CON M) extended release tablet 40 mEq  40 mEq Oral PRN    Or    potassium bicarb-citric acid (EFFER-K) effervescent tablet 40 mEq  40 mEq Oral PRN    Or    potassium chloride 10 mEq/100 mL IVPB (Peripheral Line)  10 mEq IntraVENous PRN    magnesium sulfate 2000 mg in 50 mL IVPB premix  2,000 mg IntraVENous PRN    ondansetron (ZOFRAN-ODT) disintegrating tablet 4 mg   4 mg Oral Q8H PRN    Or    ondansetron (ZOFRAN) injection 4 mg  4 mg IntraVENous Q6H PRN    polyethylene glycol (GLYCOLAX) packet 17 g  17 g Oral Daily PRN    aspirin chewable tablet 81 mg  81 mg Oral Daily    atorvastatin (LIPITOR) tablet 80 mg  80 mg Oral Nightly    metoprolol tartrate (LOPRESSOR) tablet 12.5 mg  12.5 mg Oral BID    acetaminophen (TYLENOL) tablet 650 mg  650 mg Oral Q6H PRN    Or    acetaminophen (TYLENOL) suppository 650 mg  650 mg Rectal Q6H PRN    aluminum & magnesium hydroxide-simethicone (MAALOX) 200-200-20 MG/5ML suspension 30 mL  30 mL Oral Q6H PRN            Objective:         BP 109/68   Pulse 65   Temp 98 F (36.7 C) (Oral)   Resp 18   Wt 72.1 kg (158 lb 15.2 oz)   SpO2 96%   BMI 22.17 kg/m         Temp (24hrs), Avg:97.9 F (36.6 C), Min:97.5 F (36.4 C),  Max:98 F (36.7 C)        Intake/Output Summary (Last 24 hours) at 01/04/2023 1758  Last data filed at 01/04/2023 1138  Gross per 24 hour   Intake 480 ml   Output 1500 ml   Net -1020 ml          Physical Exam  General Appearance:    No acute distress, AO1   Head:    Normocephalic, atraumatic   Eyes:    Vision grossly normal    Neck:   Supple   Back:     No deformity   Lungs:     Non labored breathing   Chest wall:    No deformity   Heart:    S1 and S2 normal, aortic systolic murmur 5/6   Abdomen:     No significant tenderness   Extremities:   Extremities without focal deformity   Skin:   Warm, dry   Neurologic:   No focal motor or sensory deficits   Psychiatric:  Cooperative      Additional comments:I reviewed and interpreted patient's clinical lab test results.    Labs    Recent Results (from the past 24 hour(s))   APTT    Collection Time: 01/03/23  8:45 PM   Result Value Ref Range    APTT 84.7 (HH) 22.5 - 33.0 SEC   POCT Glucose    Collection Time: 01/03/23  8:49 PM   Result Value Ref Range    POC Glucose 140 mg/dL    Performed by: Lovette Cliche    APTT    Collection Time: 01/04/23  3:38 AM   Result Value Ref Range     APTT 63.0 (HH) 22.5 - 33.0 SEC   Basic Metabolic Panel w/ Reflex to MG    Collection Time: 01/04/23  3:38 AM   Result Value Ref Range    Sodium 135 135 - 145 mmol/L    Potassium 3.3 (L) 3.5 - 5.0 mmol/L    Chloride 105 100 - 110 mmol/L    CO2 20 20 - 32 mmol/L    Anion Gap 13 mmol/L    Glucose 84 75 - 110 mg/dL    BUN 27 (H) 7 - 20 MG/DL    Creatinine 1.02 0.40 - 1.20 MG/DL    Bun/Cre Ratio 26     Est, Glom Filt Rate >60 ml/min/1.89m    Calcium 8.2 (L) 8.8 - 10.5 MG/DL   Magnesium    Collection Time: 01/04/23  3:38 AM   Result Value Ref Range    Magnesium 2.1 1.7 - 2.5 mg/dL   APTT    Collection Time: 01/04/23  6:23 AM   Result Value Ref Range    APTT 65.6 (HH) 22.5 - 33.0 SEC   POCT Glucose    Collection Time: 01/04/23  7:51 AM   Result Value Ref Range    POC Glucose 92 mg/dL    Performed by: HRosalita Chessman   POCT Glucose    Collection Time: 01/04/23 11:38 AM   Result Value Ref Range    POC Glucose 121 mg/dL    Performed by: HRosalita Chessman   Potassium    Collection Time: 01/04/23  3:02 PM   Result Value Ref Range    Potassium 3.6 3.5 - 5.0 mmol/L   POCT Glucose    Collection Time: 01/04/23  3:40 PM   Result Value Ref Range    POC Glucose 104 mg/dL    Performed by: FJoselyn Arrow  XR CHEST (2 VW)    Result Date: 01/01/2023  EXAM: RADIOGRAPHS OF THE CHEST INDICATION: ELEVATED WBC AND LACTATE - NO OBVIOUS SOURCE - PATIENT IS DEMENTED.; . COMPARISON: Mar 10, 2022 TECHNIQUE: Region of study: Chest.  Radiograph, 2 views. FINDINGS: There is moderate increase of the interstitial infiltrates in the right lung. Cardiomediastinal silhouette within normal limits.     Moderate increase of the interstitial infiltrates in the right lung suggestive of an overlapping acute interstitial process including atypical pneumonia or edema.     CT ABDOMEN PELVIS W IV CONTRAST Additional Contrast? None    Result Date: 01/01/2023  INTERPRETING PROVIDER:  Paulita Cradle MD EXAMINATION: CT ABDOMEN/PELVIS W/ CONTRAST DATE OF EXAM:  01/01/2023 1:56 AM CLINICAL INFORMATION: Male, 82 years old. Nausea, vomiting, leukocytosis, evaluate for acute intra-abdominal process TECHNIQUE: Axial CT images of the abdomen and pelvis were obtained after IV contrast administration. One or more of these dose optimization techniques were utilized: Automated exposure control; mA and/or kV adjustment per patient size (includes targeted exams where dose is matched to clinical indication); or iterative reconstruction. COMPARISON: Chest 08/06/2020 FINDINGS: Lung bases: Continued groundglass densities in the lung bases likely related to pulmonary fibrosis. Peritoneum/Retroperitoneum: No free air or free fluid. Vessels: Vascular calcifications. Liver: Unremarkable parenchyma. Gallbladder: Surgical clips mark site of prior cholecystectomy. Biliary: No significant biliary dilation. Spleen: Unremarkable. Pancreas: Unremarkable parenchyma. Adrenal glands: Unremarkable. Kidneys: Nonobstructive renal calculi identified.  No hydronephrosis or hydroureter. Lower GU: Unremarkable bladder. Foregut: Stomach, esophagus, and duodenum are unremarkable. Mid/Hindgut: The intestinal tract is unremarkable. No evidence of obstruction. Appendix is unremarkable. Lymph nodes: No intra-abdominal or retroperitoneal enlarged lymph nodes. No pelvic or inguinal lymphadenopathy. Soft tissues and Body wall: Fat-containing umbilical hernia. Osseous structures: No fracture or dislocation. Mild degenerative changes. No suspicious lytic or sclerotic lesions. Additional comments: None.     No significant abnormality identified. THIS IS AN ELECTRONICALLY VERIFIED REPORT Dictated By:  Paulita Cradle MD Dictated:  01/01/2023 02:20:43 Transcribed By:  Self-Edited - PowerScribe Transcribed:  01/01/2023 02:20:43 Signed By:  Paulita Cradle MD Signed:  01/01/2023 02:24:41 Providers for questions regarding this report, Monday - Friday 8am-5pm call [facility] Support at [phone]. From 5pm-7am  everyday call [facility] Reading Room at [phone]. WSN: [id number]       Assessment/Plan:     Principal Problem:    NSTEMI (non-ST elevated myocardial infarction) ([place])  Resolved Problems:    * No resolved hospital problems. *    82yM w Alzheimers presents for projectile vomiting, found with NSTEMIw trop peak 91, on heparin drip with cards pending. Echo shows EF 69% no wma or valvular disease     Patient on decreased lantus due to hospital diet.      Liquid stool likely related to gi bleeding, cdiff negative, guaic positive likelly related to heparin. GI aware, no intervention, recs appreciated.     Cards deferred MIBI due to difficulty reaching POA    DC planning if no cardiac interventino     Plan:  NSTEMI  -cards consult appreciated, recs pending  -continue heparin drip, asa, bb  -trop peaked at 91, echo w no wma, 69%      Projectile Vomiting  -GI Consult apprec, no intervention     Watery stool  -cdiff negative    T1 DM  -patient takes Lantus 48u qd and 10u qhs. Decreased to 30 and 10 while inpatient. Assess on hospital diet and re increase to home dose if there is hyperglycemia  -  sliding scale ordered  -Blood sugar achs     Alzheimers dementia  -aware  -continue namenda     HTN   HLD  Hypothyroid  -continue home meds     #DVT prophylaxis: hep drip  # Code status: full    Signed By: [name], MD     January 04, 2023

## 2023-01-04 NOTE — Progress Notes (Signed)
Patient alert and oriented x1, answers questions; calmly and is cooperative. Patient sitting up in bed. Call bell within reach, demonstrates appropriate use. Respirations even and non-labored, no complaints of shortness of breath. No complaints of pain. Medications administered per MAR. No further complaints or needs at this time.

## 2023-01-04 NOTE — Care Coordination-Inpatient (Signed)
Daily Care Management Note:    Per MDR, provider to follow up with Cards, patient to possibly discharge today, Patient to possible work with therapy and this CTN to follow for their recommendations.     This CTN will continue to follow for future CM needs.

## 2023-01-04 NOTE — Plan of Care (Signed)
Problem: ABCDS Injury Assessment  Goal: Absence of physical injury  Outcome: Progressing     Problem: Safety - Adult  Goal: Free from fall injury  Outcome: Progressing     Problem: Skin/Tissue Integrity  Goal: Absence of new skin breakdown  Description: 1.  Monitor for areas of redness and/or skin breakdown  2.  Assess vascular access sites hourly  3.  Every 4-6 hours minimum:  Change oxygen saturation probe site  4.  Every 4-6 hours:  If on nasal continuous positive airway pressure, respiratory therapy assess nares and determine need for appliance change or resting period.  Outcome: Progressing     Problem: Pain  Goal: Verbalizes/displays adequate comfort level or baseline comfort level  Outcome: Progressing     Problem: Cardiovascular - Adult  Goal: Maintains optimal cardiac output and hemodynamic stability  Outcome: Progressing  Goal: Absence of cardiac dysrhythmias or at baseline  Outcome: Progressing     Problem: Gastrointestinal - Adult  Goal: Minimal or absence of nausea and vomiting  Outcome: Progressing  Goal: Maintains or returns to baseline bowel function  Outcome: Progressing  Goal: Maintains adequate nutritional intake  Outcome: Progressing

## 2023-01-04 NOTE — Progress Notes (Signed)
At 0539 lab called with PTT result that was mistakenly drawn at 0338.    Per Waunita Schooner in pharmacy stat PTT to be redrawn and heparin gtt will be adjusted according to this new result.

## 2023-01-04 NOTE — Progress Notes (Signed)
Physical Therapy   Facility/Department: [name] Initial Assessment    Name: [name]  DOB: 04-Jun-1941  MRN: [id number]  Date of Service: 01/04/2023    Discharge Recommendations:  Home with Home health PT - Agreeable  Patient's Goal: Home with home health  PT Equipment Recommendations  Equipment Needed: No      Patient Diagnosis(es): The primary encounter diagnosis was Elevated troponin. A diagnosis of Lactic acidosis was also pertinent to this visit.  Past Medical History:  has a past medical history of Dementia ([name]), Diabetes ([name]), Grief, Hypertension, and Presence of cardiac pacemaker.  Past Surgical History:  has no past surgical history on file.    Restrictions        Subjective   General  Chart Reviewed: Yes  Patient assessed for rehabilitation services?: Yes  Family / Caregiver Present: No  Follows Commands: Within Functional Limits  Subjective  Subjective: The patient is agreeable to PT eval, SN [name] states that patient is appropriate to be seen.           Social/Functional History  Social/Functional History  Lives With:  [name])  Type of Home: Facility  Home Layout: One level  Home Access: Level entry  Bathroom Shower/Tub: Tourist information centre manager: Handicap height  Bathroom Equipment: Grab bars in shower, Grab bars around toilet  Bathroom Accessibility: Accessible  Home Equipment: Walker, [place], [place]  Has the patient had two or more falls in the past year or any fall with injury in the past year?: Unknown  Receives Help From: Other (comment) (ALF staff)  ADL Assistance: Needs assistance  Toileting: Independent  Homemaking Assistance: Needs assistance  Ambulation Assistance: Needs assistance  Transfer Assistance: Independent  Prior Level of Function: Mostly wheelchair bound, assisted with ADLs, walks short distances with walker, can toilet self.  Meals, meds, laundry, bathing, dressing provided to patient.    Vision/Hearing          Cognition   Orientation  Overall Orientation Status: Impaired  Orientation Level: Oriented to person  Cognition  Overall Cognitive Status: Exceptions  Following Commands: Follows one step commands consistently  Attention Span: Appears intact  Memory: Decreased short term memory  Safety Judgement: Decreased awareness of need for assistance  Problem Solving: Decreased awareness of errors;Assistance required to generate solutions;Assistance required to identify errors made;Assistance required to implement solutions  Insights: Decreased awareness of deficits  Initiation: Requires cues for some  Sequencing: Requires cues for some     Objective   Pulse: 64  Heart Rate Source: Monitor  BP: 123/78  BP Location: Left lower arm  BP Method: Automatic  MAP (Calculated): 93  Respirations: 18  SpO2: 96 %  O2 Device: None (Room air)  Temp: 98 F (36.7 C)                          Bed Mobility Training  Bed Mobility Training: Yes  Overall Level of Assistance: Moderate assistance  Supine to Sit: Moderate assistance;Assist X1  Scooting: Minimum assistance;Assist X1  Balance  Sitting: Impaired  Sitting - Static: Fair (occasional)  Sitting - Dynamic: Poor (constant support)  Standing: Impaired  Standing - Static: Fair  Standing - Dynamic: Optometrist: Yes  Overall Level of Assistance: Minimum assistance;Assist X1  Sit to Stand: Minimum assistance;Assist X1  Stand to Sit: Minimum assistance;Assist X1  Stand Pivot Transfers: Minimum assistance;Assist X1  Bed to  Chair: Minimum assistance;Assist X1  Gait Training  Gait Training: Yes  Gait  Distance (ft): 15 Feet  Assistive Device: Gait belt;Walker, rolling  Interventions: Manual cues;Verbal cues;Tactile cues;Safety awareness training  Base of Support: Widened  Step Length: Left shortened;Right shortened  Gait Abnormalities: Festinating gait                          Assessment   Body Structures, Functions, Activity Limitations Requiring Skilled Therapeutic  Intervention: Decreased functional mobility ;Decreased ADL status;Decreased strength;Decreased endurance;Decreased safe awareness;Decreased cognition;Decreased tolerance to work activity;Decreased balance;Decreased coordination  Assessment: The patient was referred to physical therapy due to NSTEMI and gastoenteritis causing weakness.  The patient [facility] resides at [name] he is mostly wheelchair bound walking only short distances, able to toilet and tfx self, gets assistance for bathing and dressing as well as IADLs.  Memory care states that they will be able to care for patient when medically cleared to return.  The patient Mod A supine to sit, Min A scooting, Min A sit to stand, Min A gait 15 ft with festinating gait pattern with FWW, SPT to recliner Min A.  Patient stood to resposition, chair alarm placed.  SN [name] in room with patient setting up room safely.  Patient is in need of [name] when discharged.  Therapy Prognosis: Good  Decision Making: Medium Complexity  Requires PT Follow-Up: Yes  Activity Tolerance  Activity Tolerance: Patient tolerated evaluation without incident;Patient tolerated treatment well     Plan   Physical Therapy Plan  General Plan: 3-5 times per week  Days Per Week: 5 Days  Current Treatment Recommendations: Strengthening, Balance training, Functional mobility training, Transfer training, Endurance training, Gait training, Stair training, Safety education & training, Patient/Caregiver education & training, Equipment evaluation, education, Visual merchandiser, Therapeutic activities  PT Plan of Care:  Equities trader)     Education  Patient Education  Education Given To: Patient  Education Provided: Role of Therapy;Plan of Care;Transfer Training;Orientation  Education Method: Demonstration;Verbal  Barriers to Learning: Cognition  Education Outcome: Verbalized understanding;Continued education needed    AM-PAC Score  [place] '6 Clicks'  Basic Mobility (V.2)  Inpatient Short Form  Please check the box that reflects your best answer to each question. How much help from another person do you currently need.  (If the patient hasn't done an activity recently, how much help from another person do you think he/she would need if he/she tried?) Total A Lot A Little None   1. Turning from your back to your side while in a flat bed without using bedrails? 1 2 3 4   $ 2. Moving from lying on your back to sitting on the side of a flat bed without using bedrails? 1 2 3 4   $ 3. Moving to and from a bed to a chair (including a wheelchair)? 1 2 3 4   $ 4. Standing up from a chair using your arms (e.g., wheelchair, or bedside chair)? 1 2 3 4   $ 5. To walk in [name] room? 1 2 3 4   $ 6. Climbing 3-5 steps with a railing?* 1 2 3 4   $ AM-PAC Short Form Manual (v. 3.0)  2016, Trustees [employer], under license to [employer]. All rights reserved.  *If stair climbing cannot be assessed, skip item #6. Summarize responses for items 1-5 and use the 5-item conversion table to obtain the Standardized (t-scale) score.    Raw Score: 12/20  Patient Name: [name]  Evaluating Therapist: Malva Limes, PT  Date: 01/04/2023      Urinal within reach or [place]/Foley Catheter in place: N/A  Transportation Plan: [place] with: [place]: No  [place] to Home: No    Therapy Time   Individual   Time In 1352   Time Out 1414   Minutes 64             [place], PT

## 2023-01-04 NOTE — Progress Notes (Signed)
82 y/o M admitted inpatient 12/31/22 for NSTEMI and concurrent viral gastroenteritis     PMHX Afib, PPM - v-pacing on tele. Known Dementia    Nursing Assessment: Patient A&O x2, at times confused however is pleasant. VSS, v-pacing on tele.     Rectal tube and foley discontinued, no nausea or further liquid stools. Placed in a depends with Calmoseptine to coccyx for his comfort.     Patient is drinking liberal oral fluids however is only eating bites if at all.     He has been oob to recliner chair with therapies, he was oob most of the day. He is a 1 assist with his walker.     Heparin gtt continues at 10 u/kg/hr, PTT in am labs therapeutic. Await further direction from cardiology per hospitalist.     Plan: Cards consult, MIBI deferred today secondary to patients cognitive status.     ~ Lovie Macadamia, RN, BSN

## 2023-01-04 NOTE — Progress Notes (Signed)
Alejandro Ruddy, RN, spoke with patient's POA, [name], regarding the nuclear stress test ordered for patient today.  He was not certain patient would be able to do the test, given his Alzheimers.  I went to see the patient to see if we would be able to do the test.  When I arrived to the room, he was oriented to self only, was trying to pull his foley cath out, and had pulled out his IV.  No active bleeding noted, but the IV was on his bedside table.  I advised [place], Therapist, sports of this. I also called his POA back and advised him that, due to the issues above and his confusion, we would not be doing the test today, and that I would speak to the cardiologist about it.  He was agreeable to this, and said patient does have a history of being combative.

## 2023-01-05 DIAGNOSIS — R7989 Other specified abnormal findings of blood chemistry: Secondary | ICD-10-CM

## 2023-01-05 LAB — EKG 12-LEAD
Atrial Rate: 0 ms
Atrial Rate: 68 ms
Atrial Rate: 65 ms
EKG I-40 FRONT AXIS: -72 deg
EKG I-40 FRONT AXIS: 51 deg
EKG I-40 FRONT AXIS: 27 deg
EKG I-40 HORIZONTAL AXIS: 236 deg
EKG I-40 HORIZONTAL AXIS: 57 deg
EKG I-40 HORIZONTAL AXIS: 43 deg
EKG P DURATION: 0 ms
EKG P DURATION: 146 ms
EKG P DURATION: 204 ms
EKG P FRONT AXIS: 0 deg
EKG P FRONT AXIS: 0 deg
EKG P FRONT AXIS: -17 deg
EKG P HORIZONTAL AXIS: 240 deg
EKG Q ONSET: 502 ms
EKG Q ONSET: 502 ms
EKG Q ONSET: 504 ms
EKG QRS AXIS: -45 deg
EKG QRS AXIS: -73 deg
EKG QRS AXIS: -43 deg
EKG QRS HORIZONTAL AXIS: -59 deg
EKG QRS HORIZONTAL AXIS: 235 deg
EKG QRS HORIZONTAL AXIS: 249 deg
EKG QRSD INTERVAL: 169 ms
EKG QRSD INTERVAL: 94 ms
EKG QRSD INTERVAL: 121 ms
EKG QTCB: 487 ms
EKG QTCB: 611 ms
EKG QTCB: 520 ms
EKG QTCF: 477 ms
EKG QTCF: 603 ms
EKG QTCF: 514 ms
EKG RR INTERVAL: 882 ms
EKG RR INTERVAL: 923 ms
EKG RR INTERVAL: 923 ms
EKG S-T FRONT AXIS: 128 deg
EKG S-T FRONT AXIS: 190 deg
EKG S-T FRONT AXIS: 157 deg
EKG S-T HORIZONTAL AXIS: 205 deg
EKG S-T HORIZONTAL AXIS: 82 deg
EKG S-T HORIZONTAL AXIS: 189 deg
EKG T HORIZONTAL AXIS: 209 deg
EKG T HORIZONTAL AXIS: 60 deg
EKG T HORIZONTAL AXIS: 211 deg
EKG T WAVE AXIS: 189 deg
EKG T WAVE AXIS: 98 deg
EKG T WAVE AXIS: 169 deg
EKG T-40 FRONT AXIS: -49 deg
EKG T-40 FRONT AXIS: -69 deg
EKG T-40 FRONT AXIS: -66 deg
EKG T-40 HORIZONTAL AXIS: -82 deg
EKG T-40 HORIZONTAL AXIS: 231 deg
EKG T-40 HORIZONTAL AXIS: 232 deg
Heart Rate: 65 {beats}/min
Heart Rate: 68 {beats}/min
Heart Rate: 65 {beats}/min
P-R Interval: 264 ms
P-R Interval: 294 ms
P-R Interval: 274 ms
Q-T Interval: 457 ms
Q-T Interval: 587 ms
Q-T Interval: 500 ms

## 2023-01-05 LAB — CBC
Hematocrit: 39.8 % — ABNORMAL LOW (ref 42.0–52.0)
Hemoglobin: 13.8 g/dL — ABNORMAL LOW (ref 14.0–18.0)
MCH: 31.1 PG (ref 28.0–34.0)
MCHC: 34.7 g/dL (ref 32.0–36.0)
MCV: 89.6 FL (ref 80.0–100.0)
MPV: 11 FL (ref 7.0–12.0)
Platelets: 161 10*3/uL (ref 150–400)
RBC: 4.44 M/uL — ABNORMAL LOW (ref 4.50–6.00)
RDW: 12.8 % (ref 11.5–13.5)
WBC: 10.4 10*3/uL (ref 4.8–10.8)

## 2023-01-05 LAB — POCT GLUCOSE
POC Glucose: 218 mg/dL
POC Glucose: 112 mg/dL
POC Glucose: 137 mg/dL

## 2023-01-05 LAB — APTT: APTT: 67.1 s (ref 22.5–33.0)

## 2023-01-05 LAB — POTASSIUM: Potassium: 3.3 mmol/L — ABNORMAL LOW (ref 3.5–5.0)

## 2023-01-05 MED ORDER — ISOSORBIDE MONONITRATE ER 30 MG PO TB24
30 MG | ORAL_TABLET | Freq: Every day | ORAL | 3 refills | Status: DC
Start: 2023-01-05 — End: 2023-04-12

## 2023-01-05 MED ORDER — LISINOPRIL 10 MG PO TABS
10 MG | ORAL_TABLET | Freq: Every day | ORAL | 3 refills | Status: DC
Start: 2023-01-05 — End: 2023-04-12

## 2023-01-05 MED ORDER — AMLODIPINE BESYLATE 5 MG PO TABS
5 | ORAL_TABLET | Freq: Every day | ORAL | 3 refills | Status: DC
Start: 2023-01-05 — End: 2023-01-12

## 2023-01-05 MED ORDER — ASPIRIN 81 MG PO CHEW
81 MG | ORAL_TABLET | Freq: Every day | ORAL | 3 refills | Status: DC
Start: 2023-01-05 — End: 2023-04-12

## 2023-01-05 MED ORDER — NITROGLYCERIN 0.4 MG SL SUBL
0.4 MG | ORAL_TABLET | SUBLINGUAL | 3 refills | Status: DC | PRN
Start: 2023-01-05 — End: 2023-04-12

## 2023-01-05 MED ORDER — METOPROLOL TARTRATE 25 MG PO TABS
25 | Freq: Two times a day (BID) | ORAL | Status: DC
Start: 2023-01-05 — End: 2023-01-05

## 2023-01-05 MED ORDER — AMLODIPINE BESYLATE 5 MG PO TABS
5 | Freq: Every day | ORAL | Status: DC
Start: 2023-01-05 — End: 2023-01-05

## 2023-01-05 MED ORDER — NITROGLYCERIN 0.4 MG SL SUBL
0.4 | SUBLINGUAL | Status: DC | PRN
Start: 2023-01-05 — End: 2023-01-05

## 2023-01-05 MED ORDER — METOPROLOL TARTRATE 25 MG PO TABS
25 MG | ORAL_TABLET | Freq: Two times a day (BID) | ORAL | 3 refills | Status: DC
Start: 2023-01-05 — End: 2023-04-12

## 2023-01-05 MED ORDER — ATORVASTATIN CALCIUM 80 MG PO TABS
80 | ORAL_TABLET | Freq: Every evening | ORAL | 3 refills | Status: DC
Start: 2023-01-05 — End: 2023-01-12

## 2023-01-05 MED ORDER — ISOSORBIDE MONONITRATE ER 30 MG PO TB24
30 | Freq: Every day | ORAL | Status: DC
Start: 2023-01-05 — End: 2023-01-05
  Administered 2023-01-05: 17:00:00 30 mg via ORAL

## 2023-01-05 MED FILL — HEPARIN SOD (PORCINE) IN D5W 100 UNIT/ML IV SOLN: 100 UNIT/ML | INTRAVENOUS | Qty: 250

## 2023-01-05 MED FILL — LEVOTHYROXINE SODIUM 50 MCG PO TABS: 50 MCG | ORAL | Qty: 1

## 2023-01-05 MED FILL — KLOR-CON M20 20 MEQ PO TBCR: 20 MEQ | ORAL | Qty: 2

## 2023-01-05 MED FILL — ASPIRIN LOW DOSE 81 MG PO CHEW: 81 MG | ORAL | Qty: 1

## 2023-01-05 MED FILL — ISOSORBIDE MONONITRATE ER 30 MG PO TB24: 30 MG | ORAL | Qty: 1

## 2023-01-05 MED FILL — PANTOPRAZOLE SODIUM 40 MG PO TBEC: 40 MG | ORAL | Qty: 1

## 2023-01-05 MED FILL — QUETIAPINE FUMARATE 25 MG PO TABS: 25 MG | ORAL | Qty: 2

## 2023-01-05 MED FILL — LORATADINE 10 MG PO TABS: 10 MG | ORAL | Qty: 1

## 2023-01-05 MED FILL — ONDANSETRON 4 MG PO TBDP: 4 MG | ORAL | Qty: 1

## 2023-01-05 MED FILL — ALUM & MAG HYDROXIDE-SIMETH 200-200-20 MG/5ML PO SUSP: 200-200-20 MG/5ML | ORAL | Qty: 30

## 2023-01-05 MED FILL — AMLODIPINE BESYLATE 5 MG PO TABS: 5 MG | ORAL | Qty: 2

## 2023-01-05 MED FILL — METOPROLOL TARTRATE 25 MG PO TABS: 25 MG | ORAL | Qty: 1

## 2023-01-05 MED FILL — DONEPEZIL HCL 5 MG PO TABS: 5 MG | ORAL | Qty: 1

## 2023-01-05 MED FILL — ATORVASTATIN CALCIUM 40 MG PO TABS: 40 MG | ORAL | Qty: 2

## 2023-01-05 MED FILL — LISINOPRIL 10 MG PO TABS: 10 MG | ORAL | Qty: 1

## 2023-01-05 MED FILL — TAMSULOSIN HCL 0.4 MG PO CAPS: 0.4 MG | ORAL | Qty: 1

## 2023-01-05 NOTE — Care Coordination-Inpatient (Signed)
This patient is being recommended HHS. This CTN contacted patient's POA [name] and offered HHS. She is interested and stated that she spoke with Elmyra Ricks from [facility] yesterday and would like the referral placed with [facility]. This CTN sent referral to [facility] and is waiting to hear back.

## 2023-01-05 NOTE — Care Coordination-Inpatient (Signed)
Daily Care Management Note:    Per MDR, patient to have a cardiology consult, possible discharge today, LBM 2/27.     This CTN will continue to follow for future CM needs.     Patient's discharge plan:     Patient aware of discharge plan: Yes.   Patient/Family agree with discharge plan: Yes.   Nursing staff aware of discharge plan: Yes.   Home health agency: [facility].   Home health services to be provided: PT,OT,RN.   Tentative date of [facility] opening patient to services: TBD.   Has home health agency been updated regarding patient's discharge? Yes, last update provided today at 1038.

## 2023-01-05 NOTE — Consults (Signed)
Cardiology New Consultation Note    ADMIT DATE: 12/31/2022     PRIMARY SERVICE REQUESTING THE CONSULTATION: Alejandro Sidles, MD     REASON FOR THE CONSULTATION:   Chief Complaint   Patient presents with    Emesis        Dear Alejandro Sidles, MD,     I had the pleasure of seeing your patient, Alejandro [name], a 82 y.o. male who presented to the hospital on  12/31/2022 with the chief complaints of nausea/vomiting.    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    He presented to the ED at [facility] via EMS from [place] with complain of projectile vomiting.  Apparently, he was experiencing multiple episode of vomiting throughout the day.  Due to baseline Alzheimer's dementia, history was taking from his caregiver.    ECG performed on 12/31/2018 for interpreted as normal sinus rhythm at 68 beats per minute, transient T-wave inversion with no concerning acute ST elevation MI.       He has advancing dementia and healthcare decisions are governed by his guardians, Altha Harm and Aaron Edelman (niece and nephew).    The case was discussed with on-call cardiologist who felt that it is possible that the T-wave inversions were created by recent pacing by the patient's pacemaker.    Laboratory values showed potassium of 2.5. Hs-trop 24, 29, 37, 40, 91, 38, and 41.    He underwent echocardiogram on 01/03/2023 showing a preserved calculated LVEF of 69%, RV lead present, mild-to-moderate tricuspid regurgitation.    I had a long and extended conversation with the patient and it is difficult at this juncture to truly ascertain his level of  understanding of his current condition.  He is however adamant that he does not want surgical intervention of aggressive cardiac investigation.  He informed me of transient episodic chest discomfort that at times require sublingual nitroglycerin.  He does not recall the last time that he utilizes sublingual nitroglycerin.       No acute event overnight.     PAST MEDICAL HISTORY:     Past Medical History:   Diagnosis Date    Dementia ([place])     Diabetes ([place])     Grief     loss of wife 2014    Hypertension     Presence of cardiac pacemaker      PAST SURGICAL HISTORY:   No past surgical history on file.     FAMILY HISTORY:   No family history on file.    SOCIAL HISTORY:     Social History     Socioeconomic History    Marital status: Widowed     Spouse name: Not on file    Number of children: Not on file    Years of education: Not on file    Highest education level: Not on file   Occupational History    Not on file   Tobacco Use    Smoking status: Never    Smokeless tobacco: Never   Substance and Sexual Activity    Alcohol use: Never    Drug use: Never    Sexual activity: Not on file   Other Topics Concern    Not on file   Social  History Narrative    Widowed without children. Wife died about Jan 22, 2013  Previous employment as a Pharmacist, hospital and principal  4 years of college       Social Determinants of [place] Strain: Patient Declined (08/17/2022)    Overall Financial Resource Strain (CARDIA)     Difficulty of Paying Living Expenses: Patient declined   Food Insecurity: Not on file (08/17/2022)   Transportation Needs: Unknown (08/17/2022)    PRAPARE - Armed forces logistics/support/administrative officer (Medical): Not on file     Lack of Transportation (Non-Medical): Patient declined   Physical Activity: Not on file   Stress: Not on file   Social Connections: Not on file   Intimate Partner Violence: Not on file   Housing Stability: Unknown (08/17/2022)    Housing Stability Vital Sign     Unable to Pay for Housing in the  Last Year: Not on file     Number of Places Lived in the Last Year: Not on file     Unstable Housing in the Last Year: Patient refused     ALLERGY:     Allergies   Allergen Reactions    Shellfish Allergy Anaphylaxis     REVIEW OF SYSTEM:    Review of Systems   Constitutional:  Negative for chills, fatigue and fever.   HENT:  Negative for congestion, hearing loss, nosebleeds and trouble swallowing.    Eyes:  Negative for photophobia, discharge and visual disturbance.   Respiratory:  Negative for apnea, cough, chest tightness, shortness of breath and wheezing.    Cardiovascular:  Negative for chest pain, palpitations and leg swelling.   Gastrointestinal:  Negative for abdominal distention, abdominal pain, blood in stool, constipation, diarrhea, nausea and vomiting.   Endocrine: Negative for polydipsia, polyphagia and polyuria.   Genitourinary:  Negative for difficulty urinating, dysuria, flank pain, frequency, hematuria and urgency.   Musculoskeletal:  Negative for arthralgias, back pain, gait problem, myalgias and neck pain.   Skin:  Negative for color change and rash.   Neurological:  Negative for dizziness, tremors, syncope, speech difficulty, weakness, light-headedness and numbness.   Hematological:  Does not bruise/bleed easily.   Psychiatric/Behavioral:  Negative for behavioral problems and sleep disturbance. The patient is not nervous/anxious.      Pertinent review of systems are mentioned above, the remainder are otherwise negative.      OBJECTIVE:   Patient Vitals for the past 8 hrs:   BP Temp Temp src Pulse Resp SpO2 Weight   01/05/23 0818 125/71 98.2 F (36.8 C) Oral 65 18 96 % --   01/05/23 0337 136/82 97.7 F (36.5 C) Axillary 65 17 94 % 75.1 kg (165 lb 9.1 oz)     MEDICATIONS:     Current Facility-Administered Medications   Medication Dose Route Frequency Provider Last Rate Last Admin    menthol-zinc oxide (CALMOSEPTINE) 0.44-20.6 % ointment   Topical BID PRN Alejandro Sidles, MD   1 each at 01/04/23  1657    amLODIPine (NORVASC) tablet 10 mg  10 mg Oral Daily Sabharwal, Tarun, MD   10 mg at 01/05/23 0904    insulin glargine (LANTUS;BASAGLAR) injection pen 30 Units  30 Units SubCUTAneous Daily Alejandro Sidles, MD   30 Units at 01/05/23 0911    levothyroxine (SYNTHROID) tablet 50 mcg  50 mcg Oral QAM AC Sabharwal, Tarun, MD   50 mcg at 01/05/23 0536    pantoprazole (PROTONIX) tablet 40 mg  40 mg  Oral Daily Alejandro Sidles, MD   40 mg at 01/05/23 0904    QUEtiapine (SEROQUEL) tablet 50 mg  50 mg Oral BID Alejandro Sidles, MD   50 mg at 01/05/23 0905    loratadine (CLARITIN) tablet 10 mg  10 mg Oral Daily Alejandro Sidles, MD   10 mg at 01/05/23 0904    tamsulosin (FLOMAX) capsule 0.4 mg  0.4 mg Oral Daily Alejandro Sidles, MD   0.4 mg at 01/05/23 0904    insulin glargine (LANTUS;BASAGLAR) injection pen 10 Units  10 Units SubCUTAneous Nightly Alejandro Sidles, MD   10 Units at 01/04/23 2135    donepezil (ARICEPT) tablet 5 mg  5 mg Oral Nightly Alejandro Sidles, MD   5 mg at 01/04/23 2128    lisinopril (PRINIVIL;ZESTRIL) tablet 10 mg  10 mg Oral Daily Alejandro Sidles, MD   10 mg at 01/05/23 0904    insulin lispro (1 Unit Dial) (HUMALOG/ADMELOG) pen 0-8 Units  0-8 Units SubCUTAneous TID WC Alejandro Sidles, MD   2 Units at 01/03/23 1204    insulin lispro (1 Unit Dial) (HUMALOG/ADMELOG) pen 0-4 Units  0-4 Units SubCUTAneous Nightly Sabharwal, Tarun, MD        heparin (porcine) injection 2,000 Units  2,000 Units IntraVENous PRN Sabharwal, Tarun, MD        heparin 25,000 units in dextrose 5% 250 mL (premix) infusion  5-30 Units/kg/hr IntraVENous Continuous Sabharwal, Tarun, MD 7.4 mL/hr at 01/05/23 0159 10 Units/kg/hr at 01/05/23 0159    sodium chloride flush 0.9 % injection 5-40 mL  5-40 mL IntraVENous 2 times per day Alejandro Sidles, MD   10 mL at 01/05/23 0907    sodium chloride flush 0.9 % injection 5-40 mL  5-40 mL IntraVENous PRN Sabharwal, Tarun, MD        0.9 % sodium chloride infusion   IntraVENous PRN  Sabharwal, Tarun, MD        potassium chloride (KLOR-CON M) extended release tablet 40 mEq  40 mEq Oral PRN Alejandro Sidles, MD   40 mEq at 01/03/23 1152    Or    potassium bicarb-citric acid (EFFER-K) effervescent tablet 40 mEq  40 mEq Oral PRN Alejandro Sidles, MD        Or    potassium chloride 10 mEq/100 mL IVPB (Peripheral Line)  10 mEq IntraVENous PRN Alejandro Sidles, MD 100 mL/hr at 01/02/23 1130 10 mEq at 01/02/23 1130    magnesium sulfate 2000 mg in 50 mL IVPB premix  2,000 mg IntraVENous PRN Alejandro Sidles, MD        ondansetron (ZOFRAN-ODT) disintegrating tablet 4 mg  4 mg Oral Q8H PRN Sabharwal, Tarun, MD   4 mg at 01/05/23 0902    Or    ondansetron (ZOFRAN) injection 4 mg  4 mg IntraVENous Q6H PRN Sabharwal, Tarun, MD   4 mg at 01/03/23 0524    polyethylene glycol (GLYCOLAX) packet 17 g  17 g Oral Daily PRN Alejandro Sidles, MD        aspirin chewable tablet 81 mg  81 mg Oral Daily Sabharwal, Tarun, MD   81 mg at 01/05/23 0904    atorvastatin (LIPITOR) tablet 80 mg  80 mg Oral Nightly Sabharwal, Tarun, MD   80 mg at 01/04/23 2128    metoprolol tartrate (LOPRESSOR) tablet 12.5 mg  12.5 mg Oral BID Alejandro Sidles, MD   12.5 mg at 01/05/23 [id number]    acetaminophen (TYLENOL) tablet 650 mg  650 mg Oral Q6H PRN Alejandro Sidles, MD  650 mg at 01/02/23 1951    Or    acetaminophen (TYLENOL) suppository 650 mg  650 mg Rectal Q6H PRN Alejandro Sidles, MD        aluminum & magnesium hydroxide-simethicone (MAALOX) 200-200-20 MG/5ML suspension 30 mL  30 mL Oral Q6H PRN Le, My-Anh R, DO   30 mL at 01/05/23 [id number]     The medications were reviewed and updated in the medical record.    LABS:   02/28 0701 - 02/28 1900  In: 120 [P.O.:120]  Out: -   02/26 1901 - 02/28 0700  In: 540 [P.O.:540]  Out: 1700 [Urine:1275]  Lab results reviewed. For significant abnormal values and values requiring intervention, see assessment and plan.    DATA REVIEWED:   Recent Results (from the past 24 hour(s))   POCT Glucose    Collection  Time: 01/04/23 11:38 AM   Result Value Ref Range    POC Glucose 121 mg/dL    Performed by: Rosalita Chessman    Potassium    Collection Time: 01/04/23  3:02 PM   Result Value Ref Range    Potassium 3.6 3.5 - 5.0 mmol/L   POCT Glucose    Collection Time: 01/04/23  3:40 PM   Result Value Ref Range    POC Glucose 104 mg/dL    Performed by: Joselyn Arrow    POCT Glucose    Collection Time: 01/04/23  8:26 PM   Result Value Ref Range    POC Glucose 218 mg/dL    Performed by: Joselyn Arrow    APTT    Collection Time: 01/05/23  6:57 AM   Result Value Ref Range    APTT 67.1 (HH) 22.5 - 33.0 SEC   CBC    Collection Time: 01/05/23  6:57 AM   Result Value Ref Range    WBC 10.4 4.8 - 10.8 K/uL    RBC 4.44 (L) 4.50 - 6.00 M/uL    Hemoglobin 13.8 (L) 14.0 - 18.0 g/dL    Hematocrit 39.8 (L) 42.0 - 52.0 %    MCV 89.6 80.0 - 100.0 FL    MCH 31.1 28.0 - 34.0 PG    MCHC 34.7 32.0 - 36.0 g/dL    RDW 12.8 11.5 - 13.5 %    Platelets 161 150 - 400 K/uL    MPV 11.0 7.0 - 12.0 FL   Potassium    Collection Time: 01/05/23  6:57 AM   Result Value Ref Range    Potassium 3.3 (L) 3.5 - 5.0 mmol/L   POCT Glucose    Collection Time: 01/05/23  7:23 AM   Result Value Ref Range    POC Glucose 112 mg/dL    Performed by: Colette Ribas         PHYSICAL EXAM: BP 125/71   Pulse 65   Temp 98.2 F (36.8 C) (Oral)   Resp 18   Wt 75.1 kg (165 lb 9.1 oz)   SpO2 96%   BMI 23.09 kg/m   General appearance: alert, appears stated age, and cooperative  Back: symmetric, no curvature. ROM normal. No CVA tenderness.  Lungs: clear to auscultation bilaterally  Heart: regular rate and rhythm, S1, S2 normal, no murmur, click, rub or gallop  Abdomen: soft, non-tender; bowel sounds normal; no masses,  no organomegaly  Extremities: extremities normal, atraumatic, no cyanosis or edema  Pulses: 2+ and symmetric  Neurologic: Grossly normal     ECG:  TELEMETRY:  Intermittently V-paced     OTHER CARDIAC IMAGINGS:                    ASSESSMENT:      Mr.  Alejandro [name] is a 82 y.o. 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    PLANS:     Mr. Kos endorses transient episodic chest discomfort that are at times alleviated with sublingual nitroglycerin.  He had transiently elevated troponin which speaks to the nature of his chronic stable angina and baseline coronary disease.  He does not recall the last time that he actually utilize any nitro.  It has been fairly difficult for [place] to move forward with a provocative stress test.  His echocardiogram showed preserved calculated LVEF with no focal segmental wall motion abnormalities and very minimal valvular pathology.  After further discussion with the patient, he informed me that at this juncture he would not want to move forward with aggressive investigational study for potential coronary pathologies.  He certainly does not want surgical coronary intervention.  At this juncture, I would like to initiate long-acting nitro with isosorbide mononitrate 30 mg daily in addition to p.r.n. sublingual nitroglycerin.  I will increase the metoprolol tartrate to 25 mg twice a day and decrease the amlodipine to 5 mg daily.  If the heart rate would allow, would further increase the beta-blockade (50 mg twice a day) and further decrease the amlodipine.  He is currently on high-intensity atorvastatin 80 mg daily as well as aspirin 81 mg daily.  Certainly as an outpatient, further discussion with caregiver and the patient could take place with respect to further management and optimization of cardiac regimen.  As above, at this juncture, I would like to move forward with a conservative approach to  management.  To my understanding, was previously on apixaban for thromboembolic prevention.  There was mention of concern of multiple falls as such, it is unclear to me if the DOAC was good discontinued as a precautionary measures.  Certainly, he will require outpatient cardiology follow-up for further clarification (especially of cardiac plans).   No provocative stress test during this admission.  Efforts should be made to avoid significant electrolyte abnormalities.    Thank you for allowing me to participate in the care of Mr. Yunior Boxx during his hospitalization.  Mr. Stodghill has remained fairly hemodynamically stable from a cardiovascular standpoint.  As such, at this juncture, the cardiology service will sign off.   We will be more than happy to re-evaluate if there is a need, if symptoms worsen or if symptoms fail to improve.    Thank you for allowing me to participate in the care of Mr. Delroy Hunton. Should you have any questions, please do not hesitate to contact me.    This note was transcribed by using "Voice Recognition Software, M-Modal Fluency Direct". As such, I do apologize for any typos, wrong words or any other typographical errors.     ELECTRONICALLY SIGNED BY:    AUTHOR: Harrell Lark, [place]  January 05, 2023  [facility]  [address] [place], [place]   Office: [phone]  Fax: [phone]

## 2023-01-05 NOTE — Discharge Summary (Signed)
Physician Discharge Summary     Patient: Alejandro [name] MRN: [id number]  SSN: [id number]    Date of Birth: 05-30-1941  Age: 82 y.o.  Sex: male    PCP: Cramm, Nickola Major, APRN - NP    Admit date: 12/31/2022  Admitting Provider: [name], MD    Discharge date: 01/05/2023  Discharging Provider: [name], MD    * Admission Diagnoses: Lactic acidosis [E87.20]  Elevated troponin [R79.89]  NSTEMI (non-ST elevated myocardial infarction) ([place]) [I21.4]  * Discharge Diagnoses:  Lactic acidosis [E87.20]  Elevated troponin [R79.89]  NSTEMI (non-ST elevated myocardial infarction) ([place]) [I21.4],    Hospital Course:   82yM w Alzheimers presents for projectile vomiting, found with NSTEMI w troponin peak 91, admitted on heparin drip with cards consulted on admission. Echo shows EF 69% no wma or valvular disease     Patient on decreased lantus due to hospital diet.     Liquid stool starting day after admission. Likely related to gi bleeding, cdiff negative, guaic positive likelly related to heparin. GI aware, no intervention, recs appreciated. Liquid stool resolved, soft formed stool on day of discharge. Heparin drip dcd.     Home eliquis dose continued     Cardiology consult appreciated. MIBI cancelled. Conservative management and outpatient cardiology follow up. Patient started on goal directed medical therapy. Cardio to follow up outpatient.     PCP to follow up. Recommend discussion regarding goals of care.     Significant Diagnostic Studies:  XR CHEST (2 VW)    Result Date: 01/01/2023  EXAM: RADIOGRAPHS OF THE CHEST INDICATION: ELEVATED WBC AND LACTATE - NO OBVIOUS SOURCE - PATIENT IS DEMENTED.; . COMPARISON: Mar 10, 2022 TECHNIQUE: Region of study: Chest.  Radiograph, 2 views. FINDINGS: There is moderate increase of the interstitial infiltrates in the right lung. Cardiomediastinal silhouette within normal limits.     Moderate increase of the interstitial infiltrates in the right lung suggestive of an overlapping acute  interstitial process including atypical pneumonia or edema.     CT ABDOMEN PELVIS W IV CONTRAST Additional Contrast? None    Result Date: 01/01/2023  INTERPRETING PROVIDER:  Paulita Cradle MD EXAMINATION: CT ABDOMEN/PELVIS W/ CONTRAST DATE OF EXAM: 01/01/2023 1:56 AM CLINICAL INFORMATION: Male, 82 years old. Nausea, vomiting, leukocytosis, evaluate for acute intra-abdominal process TECHNIQUE: Axial CT images of the abdomen and pelvis were obtained after IV contrast administration. One or more of these dose optimization techniques were utilized: Automated exposure control; mA and/or kV adjustment per patient size (includes targeted exams where dose is matched to clinical indication); or iterative reconstruction. COMPARISON: Chest 08/06/2020 FINDINGS: Lung bases: Continued groundglass densities in the lung bases likely related to pulmonary fibrosis. Peritoneum/Retroperitoneum: No free air or free fluid. Vessels: Vascular calcifications. Liver: Unremarkable parenchyma. Gallbladder: Surgical clips mark site of prior cholecystectomy. Biliary: No significant biliary dilation. Spleen: Unremarkable. Pancreas: Unremarkable parenchyma. Adrenal glands: Unremarkable. Kidneys: Nonobstructive renal calculi identified.  No hydronephrosis or hydroureter. Lower GU: Unremarkable bladder. Foregut: Stomach, esophagus, and duodenum are unremarkable. Mid/Hindgut: The intestinal tract is unremarkable. No evidence of obstruction. Appendix is unremarkable. Lymph nodes: No intra-abdominal or retroperitoneal enlarged lymph nodes. No pelvic or inguinal lymphadenopathy. Soft tissues and Body wall: Fat-containing umbilical hernia. Osseous structures: No fracture or dislocation. Mild degenerative changes. No suspicious lytic or sclerotic lesions. Additional comments: None.     No significant abnormality identified. THIS IS AN ELECTRONICALLY VERIFIED REPORT Dictated By:  Paulita Cradle MD Dictated:  01/01/2023 02:20:43 Transcribed By:   Self-Edited -  PowerScribe Transcribed:  01/01/2023 02:20:43 Signed By:  Paulita Cradle MD Signed:  01/01/2023 02:24:41 Providers for questions regarding this report, Monday - Friday 8am-5pm call [facility] Support at [phone]. From 5pm-7am everyday call [facility] Reading Room at [phone]. WSN: [id number]       Discharge Exam:  BP 117/71   Pulse 65   Temp 97.8 F (36.6 C) (Oral)   Resp 18   Wt 75.1 kg (165 lb 9.1 oz)   SpO2 98%   BMI 23.09 kg/m     General Appearance:    No acute distress, AO1   Head:    Normocephalic, atraumatic   Eyes:    Vision grossly normal    Neck:   Supple   Back:     No deformity   Lungs:     Non labored breathing   Chest wall:    No deformity   Heart:    S1 and S2 normal   Abdomen:     No significant tenderness   Extremities:   Extremities without focal deformity   Skin:   Warm, dry   Neurologic:   No focal motor or sensory deficits   Psychiatric:  Cooperative      * Discharge Condition: Stable  * Disposition: home w h health    Discharge Medications:  Current Discharge Medication List             Details   lisinopril (PRINIVIL;ZESTRIL) 10 MG tablet Take 1 tablet by mouth daily  Qty: 30 tablet, Refills: 3      nitroGLYCERIN (NITROSTAT) 0.4 MG SL tablet Place 1 tablet under the tongue every 5 minutes as needed for Chest pain up to max of 3 total doses. If no relief after 1 dose, call 911.  Qty: 25 tablet, Refills: 3      metoprolol tartrate (LOPRESSOR) 25 MG tablet Take 1 tablet by mouth 2 times daily  Qty: 60 tablet, Refills: 3      isosorbide mononitrate (IMDUR) 30 MG extended release tablet Take 1 tablet by mouth daily  Qty: 30 tablet, Refills: 3      atorvastatin (LIPITOR) 80 MG tablet Take 1 tablet by mouth nightly  Qty: 30 tablet, Refills: 3      aspirin 81 MG chewable tablet Take 1 tablet by mouth daily  Qty: 30 tablet, Refills: 3                Details   amLODIPine (NORVASC) 5 MG tablet Take 1 tablet by mouth daily  Qty: 30 tablet, Refills: 3                 Details   donepezil (ARICEPT) 10 MG tablet       blood glucose test strips (PRODIGY NO CODING BLOOD GLUC) strip Use to check blood sugars 3 times daily E11.65 on insulin  Qty: 300 each, Refills: 3    Associated Diagnoses: Type 1 diabetes mellitus with hyperglycemia, with long-term current use of insulin (HCC)      BD AUTOSHIELD DUO 30G X 5 MM MISC 1 each by Does not apply route in the morning, at noon, and at bedtime  Qty: 300 each, Refills: 3    Associated Diagnoses: Type 1 diabetes mellitus with hyperglycemia, with long-term current use of insulin (HCC)      Multiple Vitamins-Iron (TAB-A-VITE/IRON) TABS       HUMALOG KWIKPEN 100 UNIT/ML SOPN INJECT 7-13 UNITS UNDER THE SKIN AT BREAKFAST, LUNCH, AND SUPPER  Qty: 45  mL, Refills: 4      insulin glargine (LANTUS SOLOSTAR) 100 UNIT/ML injection Inject 10 Units into the skin nightly      sodium phosphate (FLEET) 7-19 GM/118ML Place 1 enema rectally daily as needed      glucagon 1 MG injection Inject 1 mg into the muscle as needed      Lancets MISC Uni stick 28 gauge lancets to use to test glucose QID  Qty: 360 each, Refills: 3      rosuvastatin (CRESTOR) 10 MG tablet TAKE 1 TABLET BY MOUTH AT NIGHT  Qty: 90 tablet, Refills: 1      tamsulosin (FLOMAX) 0.4 MG capsule TAKE 1 CAPSULE BY MOUTH ONCE DAILY.  Qty: 60 capsule, Refills: 0      cetirizine (ZYRTEC) 10 MG tablet Take 1 tablet by mouth daily  Qty: 90 tablet, Refills: 1      acetaminophen (TYLENOL) 325 MG tablet Take 2 tablets by mouth every 4 hours as needed for Pain or Fever  Qty: 120 tablet, Refills: 0      apixaban (ELIQUIS) 2.5 MG TABS tablet TAKE 1 TABLET BY MOUTH TWICE DAILY.      bisacodyl (DULCOLAX) 10 MG suppository Place 1 suppository rectally daily as needed      ibuprofen (ADVIL;MOTRIN) 400 MG tablet Take 1 tablet by mouth every 6 hours as needed      insulin glargine (LANTUS;BASAGLAR) 100 UNIT/ML injection pen 48 Units daily      levothyroxine (SYNTHROID) 50 MCG tablet Take 1 tablet by mouth  every morning (before breakfast)      magnesium citrate (CITROMA) SOLN Take 296 mLs by mouth daily as needed      pantoprazole (PROTONIX) 40 MG tablet TAKE 1 TABLET BY MOUTH ONCE A DAY      QUEtiapine (SEROQUEL) 50 MG tablet TAKE 1 TABLET BY MOUTH TWICE DAILY.              Current Discharge Medication List        STOP taking these medications       benazepril (LOTENSIN) 10 MG tablet Comments:   Reason for Stopping:               * Follow-up Care:   Contact information for after-discharge care     Discharge [place] .    Service: [facility] information:  [address]  [place]  [place]  [phone]                           Discharge instruction:  PATIENT DISCHARGE INSTRUCTIONS    Krzysztof Ma / [id number] DOB: 12-Apr-1941    Admitted 12/31/2022 Discharged: 01/05/2023   It is important that you take the medication exactly as they are prescribed.   Keep your medication in the bottles provided by the pharmacist and keep a list of the medication names, dosages, and times to be taken in your wallet.   Do not take other medications without consulting your doctor.     What to do at Home: Continue with your home medication as your physician/nurse told you. Follow up with your PCP.   Recommended Diet: Resume previous diet  Recommended Activity: Activity as tolerated  Home Oxygen:No    NOTE: If you experience new or worsening symptoms (including but not limited to: fever, chills, chest pain, shortness of breath, palpitations, dizziness, passing out, worst headache  of your life, confusion, loss of strength or sensation of your limbs, change in your hearing/vision/speech, vomiting, abdominal pain, diarrhea, change in urinary habits or patterns, bleeds, rash, swelling of tongue/lips/throat) seek medical care and go to the emergency room immediately.  Signed By: [name], MD     January 05, 2023       This chart was dictated using M Modal, a voice to text  software. Attempts were made to edit but it may contain unrecognized errors. Please call the office with any questions or concerns.  Time: 35 mins  Signed:  [name], MD  01/05/2023  12:36 PM

## 2023-01-05 NOTE — Progress Notes (Signed)
Physical Therapy  DECLINED, HOLD, AND/OR NOT APPROPRIATE  01/05/2023  Patient was not seen for skilled therapy treatment, secondary to pt declined and "not feeling well" . Explain: RN reports patient appropriate to be seen by therapy. Patient supine on arrival and reports "not feeling well". Patient unable to indicate any symptoms and declines "stomach ache, nausea, headache, sore throat, etc" when asked. Therapist offers for patient to transfer OOB to recliner or assist up to bathroom but patient declines at this time. Patient agrees that they would prefer for therapy check back later.   Plan: Will follow up as schedule permits.  Therapist: Seward Carol, PTA

## 2023-01-05 NOTE — Progress Notes (Signed)
Patient discharged, tele and IV removed. Potassium in am labs 3.3 and replaced per protocol. VSS, V-Paced on tele. Patient placed in a clean depends and dressed prior to transport. Discharge summary faxed to facility at confirmed fax number. Facility called and notified of patients intended return. Patient left with [facility] in stable condition.     ~ Lovie Macadamia, RN, BSN

## 2023-01-06 LAB — POCT GLUCOSE
POC Glucose: 240 mg/dL
Performed by:: 4016

## 2023-01-06 LAB — HEMOGLOBIN A1C
Estimated Avg Glucose: 160 mg/dL
Hemoglobin A1C: 7.2 % — ABNORMAL HIGH (ref 4.0–6.0)

## 2023-01-06 LAB — CULTURE, BLOOD 2: Culture: NO GROWTH

## 2023-01-06 LAB — CULTURE, BLOOD 1: Culture: NO GROWTH

## 2023-01-06 NOTE — Telephone Encounter (Signed)
TRANSITION OF CARE - NURSE PHONE ENCOUNTER     Type of Follow-up:  Hospital   Initial Contact Date:  01/06/2023  Admission Date:  2/23  Discharge Date: 2/28  Duration of this Admission:  5 days  Reason for this Admission:  Lactic acidosis [E87.20]  Elevated troponin [R79.89]  NSTEMI (non-ST elevated myocardial infarction) ([place]) [I21.4],  Are you receiving home health services?    '[x]'$  Yes     '[]'$  No  Is anyone helping you at home?-[facility]       '[x]'$  Yes     '[]'$  No   Review of discharge summary and patient's chart?  '[x]'$  Yes     '[]'$  No  Was this a readmission within 30 days for any diagnosis? '[]'$  Yes     '[x]'$  No  Was this a readmission within 30 days for same diagnosis? '[]'$  Yes     '[x]'$  No  How have you felt since leaving the hospital?:   declined      Did you get all your prescriptions?  '[x]'$  Yes     '[]'$  No  Medications were reconciled, discharge meds were reviewed and patient was instructed which meds to continue from this time forward  '[x]'$  Yes     '[]'$  No  Do you have a follow-up?   '[x]'$  Yes     '[]'$  No  Date of follow-up visit:  3/26  Follow-up provider:  Abbey Chatters, APRN - NP    Did you get a copy of the discharge instructions?   '[x]'$  Yes     '[]'$  No  Do you have any questions on those instructions?  '[]'$  Yes     '[x]'$  No  Follow-up on tests pending at discharge:    Follow-up on tests, labs, or procedures needed at D/C:      Patient contacted and I reviewed the above information with him:     '[]'$  Yes     '[x]'$  No-POA niece    If no, document why?      Does the patient or family have any concerns about their condition since discharge? '[x]'$  Yes '[]'$  No    Advance Directives on File?    '[]'$  Yes '[x]'$  No

## 2023-01-06 NOTE — Telephone Encounter (Signed)
Please see discharge summary scanned into chart and complete TOC if needed.

## 2023-01-06 NOTE — Telephone Encounter (Signed)
Noted. Thank you. FYI to PCP

## 2023-01-06 NOTE — Telephone Encounter (Signed)
From: Montel Culver  To: Nickola Major Cramm  Sent: 01/06/2023 8:50 AM EST  Subject: Follow up on Montel Culver    Good morning,     I have been following my uncles notes in the portal and see that my brother Aaron Edelman was contact this morning and was unable to answer questions? Feel free to give me a call if there's any questions that need to be addressed.     Thank you,  [name]  POA

## 2023-01-06 NOTE — Telephone Encounter (Signed)
NO toc was completed. Patient was discharged back to [facility] with home health. I called and spoke to son POA as patient has severe dementia. He was unaware he had been discharged and unable to answer my questions.   They are scheduled for a follow up with pcp on 3/26 to discuss the dementia.

## 2023-01-10 NOTE — Telephone Encounter (Signed)
Let us get patient's pharmacy to fax [place] a current med list as other providers have likely ordered meds over the past several months.  Thanks

## 2023-01-10 NOTE — Telephone Encounter (Signed)
Called to [facility]. Verbal ok was given.

## 2023-01-10 NOTE — Telephone Encounter (Signed)
Mariah with [facility] called to inform the office that she opened the pt to home care on Saturday (3/2). He will have nursing, PT and OT. She notes that there were some medication discrepancies. They do not have aspirin or atorvastatin that is on his med list and he is taking amlodipine '10mg'$  not '5mg'$ .  She also notes that the pt is taking benazepril that is not on his med list.

## 2023-01-10 NOTE — Telephone Encounter (Signed)
[place] from [facility] is asking, for a Verbal Order to admit Pt to Hospice.    [phone] Mikayla

## 2023-01-10 NOTE — Telephone Encounter (Signed)
Fax sent requesting most recent medication list to compare.

## 2023-01-10 NOTE — Telephone Encounter (Signed)
Yes okay

## 2023-01-11 NOTE — Telephone Encounter (Signed)
Alejandro [name] from [facility] stated the pt has been experiencing low blood sugars.    3/4 fasting am blood sugar 46  3/5 fasting am blood sugar 57    Pt is eating less. She is unsure if the pt is symptomatic due to his dementia.     She would like to know if it is ok to to decrease his insulin?    Cb [id number]

## 2023-01-11 NOTE — Telephone Encounter (Signed)
[name] at [facility] notified. She asked if we could send this over to [place]. I have printed this for Eddie Dibbles to sign and then we can fax this to them.

## 2023-01-11 NOTE — Telephone Encounter (Signed)
Hospice patient     Discontinue nighttime Lantus insulin 10 units.    Discontinue Humalog insulin with meals.    Reduce morning Lantus to 20 units.    When patient stops eating discontinue all insulin.    Thanks

## 2023-01-11 NOTE — Telephone Encounter (Signed)
Eddie Dibbles signed and will be faxed today

## 2023-01-11 NOTE — Telephone Encounter (Signed)
Please let [name] node is okay to reduce insulin.  Patient is a hospice patient.

## 2023-01-12 NOTE — Telephone Encounter (Signed)
Fax received. Complete medication review complete.

## 2023-01-12 NOTE — Addendum Note (Signed)
Addended by: Duayne Cal on: 01/12/2023 09:27 AM     Modules accepted: Orders

## 2023-01-12 NOTE — Telephone Encounter (Signed)
Hospice care patient.    Updated med list received from skilled nursing facility    Reviewed medication list  Discontinued short-acting insulin with meals  Discontinued bedtime Lantus insulin  Reduced dose of morning Lantus insulin from 48 units to 20 units  Communicated directions to discontinue Lantus insulin completely when patient stops eating.

## 2023-01-14 MED ORDER — ZINC OXIDE 10 % EX OINT
10 % | CUTANEOUS | 0 refills | Status: DC | PRN
Start: 2023-01-14 — End: 2023-04-12

## 2023-01-14 NOTE — Telephone Encounter (Signed)
 [name] from [facility] calling asking if pcp still wants [facility] to be checking pts blood sugar and if so, how often and they will need new orders. Alan is also asking if pcp will order barrier cream to be applied to pts groin. She said he has skin breakdown and redness in the peri area. She is asking if this can be addressed urgently as she is not sure how much longer pt will be with them. She said she knows pt is transitioning to hospice, but he is not really eating and can barely stay awake for their visits. Please advise CB# [phone]

## 2023-01-14 NOTE — Telephone Encounter (Signed)
 I would have blood sugars checked as long as patient is still on insulin . It looks like PCP discontinued night time Lantus  insulin  and discontinued Humalog  insulin  with meals and morning Lantus  was reduced to 20 units with plan to discontinue this once patient stops eating. If he is just receiving Lantus  in the morning I would check sugars once daily in the morning and stop checking blood sugars once he is no longer receiving treatment with insulin . Barrier cream order placed with instructions to apply every 4-6 hours as needed for skin breakdown/irritation

## 2023-01-14 NOTE — Telephone Encounter (Signed)
Called to [place]. She is aware.     I have faxed a letter with the instructions to [facility].

## 2023-01-16 NOTE — Telephone Encounter (Signed)
Received page at 1614 on 01/16/23; page returned within 5 minutes; Irven Coe from [facility]; letting us know Pt was admitted to services today.

## 2023-01-17 NOTE — Telephone Encounter (Signed)
Alejandro [name] from [facility] said that they can't get Zinc Oxide 10 % OINT [id number] and its not covered by insurance. She wants to know if they can get a 20% instead.

## 2023-01-17 NOTE — Telephone Encounter (Signed)
To covering provider to advise please

## 2023-01-17 NOTE — Telephone Encounter (Signed)
I called and gave Alejandro [name] verbal on 20 percent per covering provider. She is aware and will update this.     Signed by Abner Greenspan, MA

## 2023-01-17 NOTE — Telephone Encounter (Signed)
Noted. Thank you

## 2023-01-24 ENCOUNTER — Telehealth

## 2023-01-24 NOTE — Telephone Encounter (Signed)
Checked [employer] and nothing yet for this patient.

## 2023-01-24 NOTE — Telephone Encounter (Signed)
Noted  

## 2023-01-24 NOTE — Telephone Encounter (Signed)
No results on HIN at this time.     Sending to [facility] to track down UA and scan into the chart.

## 2023-01-24 NOTE — Telephone Encounter (Addendum)
Called to [name]. She said that she is not sure where Alejandro [name] sends out the UA's. Called to [facility]. Spoke with the memory care unit. This will be sent to [facility] on [address]. I will look out for this on HIN and If I don't see anything. Will call for results.

## 2023-01-24 NOTE — Telephone Encounter (Signed)
UA ordered and faxed electronically to the fax number provided

## 2023-01-24 NOTE — Telephone Encounter (Signed)
Will await results of urine testing-do we know lab hospice nurse will be bringing urine sample?

## 2023-01-24 NOTE — Telephone Encounter (Signed)
Caller and Callback #: patient  Complaint:   Chief Complaint   Patient presents with    Urinary Tract Infection       Abbi from [facility] called and would like a order for a UA to be sent to [facility] at [phone]    Symptom onset:two days       RED FLAGS - If Yes, route note to MA pool as Urgent  Urgency/frequency [x] Y [] N   Pain with urinating [x] Y [] N   Unable to urinate [] Y [x] N   Male [] Y [x] N   Pregnant [] Y [x] N   Vaginal discharge or bleeding [] Y [x] N   Male [x] Y [] N   Pain in testicles [] Y [x] N         Disposition:  [] Transferred to MA  [x] Note routed to MA pool as Urgent  [] Appt today with PCP  [] Appt today with other provider  [] Appt within 7 days with PCP  [] Appt within 7 days with other provider  [] Unable to schedule. Comments required:   [] Patient declined appointment. Comments required:

## 2023-01-27 ENCOUNTER — Encounter

## 2023-01-27 NOTE — Telephone Encounter (Signed)
Urine sample was negative for infection.  Please let hospice nursing staff know same.  Thanks

## 2023-01-27 NOTE — Telephone Encounter (Signed)
Noted  

## 2023-01-27 NOTE — Telephone Encounter (Signed)
Please see UA results in chart and advise if needed.

## 2023-01-27 NOTE — Telephone Encounter (Addendum)
POA [name] House POA called back and her dad is currently on hospice so she wants to cancel. She does not see the need for this appt. I canceled. If they need [place] they will call.       Called Daud Blasingame POA left msg for permission to treat. Anyone who gets the call can take the permission and get a witness please.

## 2023-01-28 ENCOUNTER — Telehealth

## 2023-01-28 MED ORDER — INSULIN GLARGINE 100 UNIT/ML SC SOPN
100 UNIT/ML | Freq: Every morning | SUBCUTANEOUS | 1 refills | Status: DC
Start: 2023-01-28 — End: 2023-02-03

## 2023-01-28 NOTE — Telephone Encounter (Signed)
High blood glucose levels would indicate need for insulin not discontinuing.      Insulin lispro was discontinued 01/01/2023     With reported glucose levels increase Lantus insulin to 25 units once a day in the morning     As patient is a hospice patient please let our office know if patient reduces his daily food intake and/or stops eating.  Thanks

## 2023-01-28 NOTE — Telephone Encounter (Signed)
Letter printed signed and faxed.

## 2023-01-28 NOTE — Telephone Encounter (Signed)
Printed, signed and faxed to [facility].

## 2023-01-28 NOTE — Telephone Encounter (Signed)
-----   Message from Annia Belt, [place] sent at 01/27/2023  6:15 PM EDT -----  Regarding: FW: Montel Culver- [employer] equipment   Contact: [phone]    ----- Message -----  From: Montel Culver  Sent: 01/27/2023   5:42 PM EDT  To: [facility] Clinical Staff  Subject: Montel Culver- [employer] equipment                Good evening,     I need to provide [employer] a letter indicating Alejandro [name] no longer needs oxygen, to remove their products from [place]. Alejandro [name] is on hospice care now, and we no longer need [employer] equipment.     Can you provide a letter directly to [place]? If not, my email is [email or web address].     Thank you,  Alejandro [name], POA

## 2023-01-28 NOTE — Telephone Encounter (Signed)
Fax sent with results

## 2023-01-28 NOTE — Telephone Encounter (Signed)
Please formulate letter requesting discontinuation of all oxygen supplies -I will sign same so we can faxed to [place].

## 2023-01-28 NOTE — Telephone Encounter (Signed)
[name] with [facility] hosp[facility] is requesting for the pts insulin to be discontinued as he has been having high blood sugar:    3/14 385  3/15-286  3/16-244  3/17-143  3/18-129  3/19-450  3/20-297  3/21- morning 256 evening 380  3/22 morning 398.     She also is wondering if Eddie Dibbles would like to adjust the long acting 20 units of Lantus?   For an order change, fax to [phone]   Abbys CB#[phone]

## 2023-01-28 NOTE — Telephone Encounter (Signed)
Called and relayed the message abby stated that pt is eating breakfast well but the other meals are a hit or miss. [name] is going to call [facility] and make sure they got the order and we will reassess

## 2023-01-28 NOTE — Telephone Encounter (Signed)
Received page from [place] with [facility] requesting an update on this med change request. Please call [name] to inform of PCP's recommendation and that we faxed orders to [place]. Thanks

## 2023-02-01 ENCOUNTER — Ambulatory Visit: Payer: MEDICARE | Attending: Registered Nurse | Primary: Registered Nurse

## 2023-02-02 ENCOUNTER — Telehealth

## 2023-02-02 MED ORDER — ATROPINE SULFATE 1 % OP SOLN
1 | OPHTHALMIC | 0 refills | Status: DC
Start: 2023-02-02 — End: 2023-04-12

## 2023-02-02 MED ORDER — MORPHINE SULFATE (CONCENTRATE) 20 MG/ML PO SOLN
20 MG/ML | ORAL | 0 refills | Status: AC | PRN
Start: 2023-02-02 — End: 2023-02-07

## 2023-02-02 MED ORDER — ATROPINE SULFATE 0.01 % OP SOLN
0.01 | OPHTHALMIC | 0 refills | Status: DC
Start: 2023-02-02 — End: 2023-02-02

## 2023-02-02 MED ORDER — LORAZEPAM 2 MG/ML PO CONC
2 MG/ML | ORAL | 0 refills | Status: AC | PRN
Start: 2023-02-02 — End: 2023-02-16

## 2023-02-02 MED ORDER — LORAZEPAM 2 MG/ML PO CONC
2 | ORAL | 0 refills | Status: DC | PRN
Start: 2023-02-02 — End: 2023-02-02

## 2023-02-02 NOTE — Addendum Note (Signed)
Addended by: Hazeline Junker on: 02/02/2023 02:48 PM     Modules accepted: Orders

## 2023-02-02 NOTE — Telephone Encounter (Signed)
signed

## 2023-02-02 NOTE — Addendum Note (Signed)
Addended by: Armando Reichert on: 02/02/2023 03:05 PM     Modules accepted: Orders

## 2023-02-02 NOTE — Telephone Encounter (Signed)
Changed to 39ml. Will have covering sign

## 2023-02-02 NOTE — Telephone Encounter (Signed)
Pharmacy called back again and said they need the LORazepam (ATIVAN) 2 MG/ML concentrated solution [id number] fixed. It only comes in 82mL and it was sent for 54mL

## 2023-02-02 NOTE — Telephone Encounter (Signed)
[facility] called back and said that the atrophine  needs to be 1%, not 0.01%

## 2023-02-02 NOTE — Telephone Encounter (Signed)
New script sent.

## 2023-02-02 NOTE — Telephone Encounter (Signed)
Scripts are qued up Helene Kelp can you please sign

## 2023-02-02 NOTE — Addendum Note (Signed)
Addended by: Hazeline Junker on: 02/02/2023 02:30 PM     Modules accepted: Orders

## 2023-02-02 NOTE — Telephone Encounter (Signed)
Jacklyn Shell RN from [facility] is requesting comfort meds for the pt.      morphine concentrate 20 mg/ml   0.25 ml by mouth every hour as needed for pain and shortness of breath.    lorazepam 2mg /ml give 0.25 ml by mouth every 4 hrs as needed for anxiety and agitation     atrophine 1% give 2 drops sublingual every 4 hours as needed for excessive secretions.     [address] to [facility]   [address]      Please fax [facility] Go the new orders    attn brittany [phone]

## 2023-02-02 NOTE — Telephone Encounter (Signed)
Mickel Baas from [facility] aware the scripts were faxed to [facility] and copy of this note faxed to [place]

## 2023-02-03 ENCOUNTER — Telehealth

## 2023-02-03 MED ORDER — LORAZEPAM 2 MG/ML PO CONC
2 MG/ML | ORAL | 0 refills | Status: AC | PRN
Start: 2023-02-03 — End: 2023-05-04

## 2023-02-03 MED ORDER — INSULIN GLARGINE 100 UNIT/ML SC SOPN
100 UNIT/ML | Freq: Every morning | SUBCUTANEOUS | 1 refills | Status: DC
Start: 2023-02-03 — End: 2023-04-12

## 2023-02-03 MED ORDER — MORPHINE SULFATE (CONCENTRATE) 20 MG/ML PO SOLN
20 MG/ML | ORAL | 0 refills | Status: AC | PRN
Start: 2023-02-03 — End: 2023-03-05

## 2023-02-03 NOTE — Telephone Encounter (Signed)
[place] from [facility] called and the pt sugars have been high.     810am at 8am 540   3/27  at 8am    371                430pm 418   3/26 @ 8am 348     This morning there was blood found on his shirt and pillow and it appears that he did have blood in his spit.   And the pt had a small TIA yesterday and [place] would like a call back advising what they should do for the pt.   She did say hospice will be by to see the pt today . Juluis Rainier     [phone]

## 2023-02-03 NOTE — Telephone Encounter (Signed)
These are hospice medications ordered by hospice service and typically are not ordered for an extended period of time usually no more than 5-7 days.   She will need to reach out to hospice  service regarding these orders.  thanks

## 2023-02-03 NOTE — Telephone Encounter (Signed)
Called to [facility]. [place] is aware of the below. Fax sent.     She said that the Morphine order is only good for 5 days. They need this for 30.     The Lorazepam order is only good for 14 days. They said this is typically 90 days.     Can we chagne these?

## 2023-02-03 NOTE — Addendum Note (Signed)
Addended by: Duayne Cal on: 02/03/2023 04:49 PM     Modules accepted: Orders

## 2023-02-03 NOTE — Telephone Encounter (Signed)
We can reorder these meds 1-2 days before next due-  hospice meds are ordered differently from standard medications.  Thanks

## 2023-02-03 NOTE — Addendum Note (Signed)
Addended by: Hazeline Junker on: 02/03/2023 03:34 PM     Modules accepted: Orders

## 2023-02-03 NOTE — Telephone Encounter (Signed)
Alejandro [name] from [facility] called.  She said that the orders for Morphine and Lorazepam needs to be PRN as needed.     Alejandro [name] also wants to ensure the messaged was received from [place] this morning and I assured her it was , as I took that call. She said that they would keep and eye on her .FYI         [id number]

## 2023-02-03 NOTE — Telephone Encounter (Signed)
Increase Lantus insulin from 25 units to 30 units every morning. Order has been entered for same.      Please ask hospice nurse to update our office with patient's current health status.      Thanks

## 2023-02-03 NOTE — Telephone Encounter (Signed)
Called to [place]. User busy. Will try again later. Fax sent with the instructions as well.

## 2023-02-03 NOTE — Telephone Encounter (Signed)
Called and left message for [place]. If/when pateint calls back, okay for non-clinical staff to give the message to patient.       Fax also sent to [facility] with the new orders.

## 2023-02-03 NOTE — Telephone Encounter (Signed)
Ordered morphine and lorazepam as needed per request.  Please communicate same to staff.  Thanks

## 2023-02-03 NOTE — Telephone Encounter (Signed)
Hospice called [place] yesterday and Helene Kelp signed for the meds. We had only qued up what the program would let us, but we can override the quantity. I have the morphine and lorazepam qued up. Pt has not received these yet due to the error in the scripts from yesterday.

## 2023-02-14 NOTE — Telephone Encounter (Signed)
Please compose letter to [facility] and I will sign same- discontinue all oxygen equipment. Ty

## 2023-02-14 NOTE — Telephone Encounter (Signed)
Printed and faxed

## 2023-02-14 NOTE — Telephone Encounter (Addendum)
-----   Message from Herbie Saxon, MA sent at 02/14/2023  7:23 AM EDT -----  Regarding: FW: Alejandro [name]- [employer] equipment   Contact: [phone]    ----- Message -----  From: Alejandro [name]  Sent: 02/13/2023   9:10 AM EDT  To: [facility] Clinical Staff  Subject: Alejandro [name]- [employer] equipment                Following up on my [employer] email- please advise. Thanks       Good evening,      I need to provide [employer] a letter indicating Chrissie Noa no longer needs oxygen, to remove their products from [place]. [place] is on hospice care now, and we no longer need [employer] equipment.      Can you provide a letter directly to [employer]? If not, my email is [email or web address].      Thank you,  Kennyth Arnold, POA

## 2023-02-14 NOTE — Telephone Encounter (Signed)
Letter printed and faxed

## 2023-02-14 NOTE — Telephone Encounter (Signed)
Alejandro [name] with [facility] is requesting some bowel regimen changes faxed to [facility].   Bisacodyl suppository changed to daily and order to change if BM within the last 3 days.   Milk of Magnesia daily PRN   Senna 8.6 mg tab daily   2 tabs PRN daily for constipation.   Fax: [phone] attn [facility]  Alejandro [name]'s CB# [phone]

## 2023-02-14 NOTE — Telephone Encounter (Signed)
Yes hospice changes for medications okay.  Thank you

## 2023-02-17 ENCOUNTER — Telehealth

## 2023-02-17 NOTE — Telephone Encounter (Signed)
Discussed with [facility]-amount of hospice medication being requested.  This is in excess of what hospice typically orders.    Called [facility] to speak with supervisor-supervisor unavailable at this time.    Appropriate amount of concentrated liquid morphine 20 mg/ml is one 30 ml bottle.  Appropriate amount of liquid lorazepam 2 mg/ml is one 30 ml bottle.    Are hospice meds being ordered through [facility] drug?  Please check with [name] as these orders may need to be sent to pharmacy [facility] is working with.

## 2023-02-17 NOTE — Telephone Encounter (Signed)
[name] with [facility] is requesting an order to continue with     30 days of morphine sulfate 20 MG/ML concentrated oral solution        90 days LORazepam (ATIVAN) 2 MG/ML concentrated solution       Please call her back and let her know [phone]

## 2023-02-18 MED ORDER — LORAZEPAM 2 MG/ML PO CONC
2 | ORAL | 0 refills | Status: AC | PRN
Start: 2023-02-18 — End: 2023-03-10

## 2023-02-18 MED ORDER — MORPHINE SULFATE (CONCENTRATE) 20 MG/ML PO SOLN
20 MG/ML | ORAL | 0 refills | Status: AC | PRN
Start: 2023-02-18 — End: 2023-03-20

## 2023-02-18 NOTE — Telephone Encounter (Signed)
Melanie from [facility] hosp[facility] returning [place] call from yesterday. She said he can call back @ [phone] if still needed. Please advise

## 2023-02-18 NOTE — Telephone Encounter (Signed)
Prescriptions for both requested hospice meds have been sent to [facility] long-term care.  Morphine 20 mg/ml, 30 ml bottle.  Lorazepam 2mg /ml, 30 ml bottle.  No refills.

## 2023-02-18 NOTE — Telephone Encounter (Signed)
Noted  

## 2023-02-21 NOTE — Telephone Encounter (Signed)
Noted  

## 2023-03-08 MED ORDER — APIXABAN 2.5 MG PO TABS
2.5 MG | ORAL_TABLET | Freq: Two times a day (BID) | ORAL | 3 refills | Status: DC
Start: 2023-03-08 — End: 2023-04-12

## 2023-03-08 NOTE — Telephone Encounter (Signed)
Unable to sign due to date of last fill.     "Medication: Eliquis  Last Office Visit: 1 year  Lab Monitoring: CBC, CMP annually-01/01/23, Check chart for duration of therapy  Category: Oral Anticoagulant  Length of refill: 1 year  Brand Name: Yes  Comments:"    Medications Requested:  Requested Prescriptions     Pending Prescriptions Disp Refills    ELIQUIS 2.5 MG TABS tablet [Pharmacy Med Name: Eliquis 2.5 MG Tablet] 60 tablet 3     Sig: TAKE 1 TABLET BY MOUTH TWICE DAILY.       Preferred Pharmacy:   [facility] - [place], [place] - [address] [place] [phone] [name] [phone]  [address]  [place] [address]  Phone: [phone] Fax: [phone]      Date of Last Refill: 07/21/20    Prescription Refill Protocol reviewed:YES    Allergy List Reviewed and Verified: YES    Possible medication to medication interactions reviewed: YES    Last appt @ PCP Office: 10/07/2022     No future appointments.    MOST RECENT BLOOD PRESSURES  BP Readings from Last 3 Encounters:   01/05/23 117/71   10/18/22 (!) 149/73   06/19/22 111/60         MOST RECENT LAB DATA  Lab Results   Component Value Date/Time    K 3.3 01/05/2023 06:57 AM    ALT 19 01/02/2023 05:09 AM    TSH 4.671 01/01/2023 03:24 PM    CHOL 134 01/02/2023 05:09 AM    CHOL 107 08/08/2020 11:00 AM    HGB 13.8 01/05/2023 06:57 AM    HCT 39.8 01/05/2023 06:57 AM    HBA1CPOC 7.0 12/01/2021 03:30 PM

## 2023-03-16 ENCOUNTER — Encounter

## 2023-03-16 MED ORDER — MORPHINE SULFATE (CONCENTRATE) 20 MG/ML PO SOLN
20 MG/ML | ORAL | 0 refills | Status: DC | PRN
Start: 2023-03-16 — End: 2023-04-12

## 2023-03-16 NOTE — Telephone Encounter (Signed)
CONTROLLED SUBSTANCE REFILL REQUEST    CLINICAL STAFF ACTION: PMP reviewed and appropriate.   Pill Count up to date, UDS up to date, and CSA up to date.  RX pended for signature.      Requested Prescriptions     Pending Prescriptions Disp Refills    morphine sulfate 20 MG/ML concentrated oral solution 30 mL 0     Sig: Take 0.25 mLs by mouth every hour as needed for Pain or Shortness of Breath for up to 30 days. Hospice pt. Max Daily Amount: 120 mg       MEDICATION #1  Next due date:  02/07/2023  Last prescription refill date: 02/02/2023  Number of pills given with last prescription: 30mL  Maximum pills per day: 120mg         Last office visit: 10/07/2022     No future appointments.     Controlled Substance Flowsheet (last value only)      03/16/2023    10:55 AM   [place] Controlled Substance Flowsheet   PMP Reviewed 03/16/2023           Documented by: Murlean Iba, CMA

## 2023-03-16 NOTE — Telephone Encounter (Signed)
Patient's name and date of birth verified at start of call.   Incoming refill request.  Caller has been notified that we require a minimum of 2 business days for refill processing. (This does not include weekends, holidays, etc.)      Alejandro [name]  04-16-1941      Confirmed best contact number:   Other:      Medications Requested:  Requested Prescriptions     Pending Prescriptions Disp Refills    morphine sulfate 20 MG/ML concentrated oral solution 30 mL 0     Sig: Take 0.25 mLs by mouth every hour as needed for Pain or Shortness of Breath for up to 30 days. Hospice pt. Max Daily Amount: 120 mg       Preferred Pharmacy:   [facility] - [place], [place] - [address] - [address]etrius Charity [phone] [name] [phone]  [address]  [address]  [place] [address]  Phone: [phone] Fax: [phone]    Has patient already contacted pharmacy to confirm no refills were on file: No    Notes for office regarding medication request:    Please contact [name] at [facility] if there is an issue refilling this [phone]      Other instructions and notes:  Last visit with provider: 10/07/2022  Next scheduled visit with provider: Visit date not found  *If next visit is not scheduled, book next needed visit or document if recall was added to list prior to sending for processing*  Inform patient that this needs to be on file before sending request as we do require for them to remain up-to-date with their recommended healthcare in order to avoid any interruptions in our ability to provide ongoing care such as refills.     Patient MyChart Status:  For Active Patients - Patient has been notified that they will receive an automated notification via MyChart once their script has been processed.   For Inactive Patients - Patient is aware that we have a patient portal, MyChart, which offers many benefits such as being able to request their refills electronically and receive automated notifications when scripts are processed.  In addition,  they can schedule and manage appointments, view their testing results and visit notes, and stay connected with their care team.  Patient offered MyChart today.  Patient accepted.  (Send link for set up if accepted)

## 2023-04-08 NOTE — Telephone Encounter (Signed)
Patient's name and DOB verified at start of call.    Name of caller & relationship? [address] [address] [employer]  Calling to inform that the patient is deceased as of 04-12-2023.  Death Certificate Number? (from funeral home, if one has been started) [id number]  CB#: [phone]      Staff Verification Section:  Patient seen or treated in a [facility] or Practice within 48 hours of their death: no        IF YES - Immediately escalate to Engineer, manufacturing for review and potential reporting.  (Send encounter and notify by telephone)    All upcoming appointment's have been cancelled: Yes    ONE CALL routes note to MA pool and [employer] Yes

## 2023-04-08 NOTE — Telephone Encounter (Signed)
Death certificate completed.

## 2023-04-09 DEATH — deceased
